# Patient Record
Sex: Male | Born: 1950
Health system: Southern US, Community
[De-identification: ages and names within clinical notes are randomized; demographics above are authoritative.]

## PROBLEM LIST (undated history)

## (undated) DIAGNOSIS — I10 Essential (primary) hypertension: Secondary | ICD-10-CM

## (undated) DIAGNOSIS — E039 Hypothyroidism, unspecified: Secondary | ICD-10-CM

## (undated) DIAGNOSIS — K219 Gastro-esophageal reflux disease without esophagitis: Secondary | ICD-10-CM

## (undated) DIAGNOSIS — G43109 Migraine with aura, not intractable, without status migrainosus: Secondary | ICD-10-CM

## (undated) DIAGNOSIS — Z87442 Personal history of urinary calculi: Secondary | ICD-10-CM

## (undated) DIAGNOSIS — I4891 Unspecified atrial fibrillation: Secondary | ICD-10-CM

## (undated) DIAGNOSIS — E7800 Pure hypercholesterolemia, unspecified: Secondary | ICD-10-CM

## (undated) DIAGNOSIS — N200 Calculus of kidney: Secondary | ICD-10-CM

## (undated) DIAGNOSIS — M1990 Unspecified osteoarthritis, unspecified site: Secondary | ICD-10-CM

## (undated) DIAGNOSIS — K3184 Gastroparesis: Secondary | ICD-10-CM

## (undated) DIAGNOSIS — J189 Pneumonia, unspecified organism: Secondary | ICD-10-CM

## (undated) DIAGNOSIS — F1020 Alcohol dependence, uncomplicated: Secondary | ICD-10-CM

## (undated) DIAGNOSIS — Z8489 Family history of other specified conditions: Secondary | ICD-10-CM

## (undated) DIAGNOSIS — C4491 Basal cell carcinoma of skin, unspecified: Secondary | ICD-10-CM

## (undated) DIAGNOSIS — IMO0002 Reserved for concepts with insufficient information to code with codable children: Secondary | ICD-10-CM

## (undated) DIAGNOSIS — F32A Depression, unspecified: Secondary | ICD-10-CM

## (undated) DIAGNOSIS — H353120 Nonexudative age-related macular degeneration, left eye, stage unspecified: Secondary | ICD-10-CM

## (undated) DIAGNOSIS — F329 Major depressive disorder, single episode, unspecified: Secondary | ICD-10-CM

## (undated) HISTORY — DX: Unspecified atrial fibrillation: I48.91

## (undated) HISTORY — PX: COLONOSCOPY: SHX174

## (undated) HISTORY — DX: Alcohol dependence, uncomplicated: F10.20

## (undated) HISTORY — DX: Personal history of urinary calculi: Z87.442

## (undated) HISTORY — DX: Gastroparesis: K31.84

## (undated) HISTORY — PX: CYSTOSCOPY W/ STONE MANIPULATION: SHX1427

## (undated) HISTORY — DX: Unspecified osteoarthritis, unspecified site: M19.90

## (undated) HISTORY — DX: Essential (primary) hypertension: I10

## (undated) HISTORY — PX: BACK SURGERY: SHX140

## (undated) HISTORY — PX: SQUAMOUS CELL CARCINOMA EXCISION: SHX2433

## (undated) HISTORY — DX: Pure hypercholesterolemia, unspecified: E78.00

## (undated) HISTORY — PX: BASAL CELL CARCINOMA EXCISION: SHX1214

## (undated) HISTORY — PX: MOHS SURGERY: SUR867

## (undated) HISTORY — DX: Gastro-esophageal reflux disease without esophagitis: K21.9

## (undated) HISTORY — PX: CATARACT EXTRACTION W/ INTRAOCULAR LENS IMPLANT: SHX1309

---

## 2001-12-09 ENCOUNTER — Emergency Department (HOSPITAL_COMMUNITY): Admission: EM | Admit: 2001-12-09 | Discharge: 2001-12-09 | Payer: Self-pay | Admitting: Emergency Medicine

## 2001-12-10 ENCOUNTER — Encounter: Payer: Self-pay | Admitting: Emergency Medicine

## 2001-12-10 ENCOUNTER — Encounter: Payer: Self-pay | Admitting: Cardiology

## 2001-12-10 ENCOUNTER — Inpatient Hospital Stay (HOSPITAL_COMMUNITY): Admission: EM | Admit: 2001-12-10 | Discharge: 2001-12-12 | Payer: Self-pay | Admitting: Emergency Medicine

## 2001-12-11 ENCOUNTER — Encounter: Payer: Self-pay | Admitting: Cardiology

## 2002-03-26 ENCOUNTER — Ambulatory Visit (HOSPITAL_COMMUNITY): Admission: RE | Admit: 2002-03-26 | Discharge: 2002-03-26 | Payer: Self-pay | Admitting: Cardiology

## 2002-03-26 ENCOUNTER — Encounter: Payer: Self-pay | Admitting: Cardiology

## 2002-03-29 HISTORY — PX: INGUINAL HERNIA REPAIR: SUR1180

## 2002-06-28 HISTORY — PX: CARDIOVERSION: SHX1299

## 2002-07-17 ENCOUNTER — Ambulatory Visit (HOSPITAL_COMMUNITY): Admission: RE | Admit: 2002-07-17 | Discharge: 2002-07-17 | Payer: Self-pay | Admitting: Cardiology

## 2003-02-27 HISTORY — PX: COLONOSCOPY W/ POLYPECTOMY: SHX1380

## 2003-03-25 ENCOUNTER — Ambulatory Visit (HOSPITAL_COMMUNITY): Admission: RE | Admit: 2003-03-25 | Discharge: 2003-03-25 | Payer: Self-pay | Admitting: Gastroenterology

## 2003-03-25 ENCOUNTER — Encounter (INDEPENDENT_AMBULATORY_CARE_PROVIDER_SITE_OTHER): Payer: Self-pay

## 2009-04-29 HISTORY — PX: ANTERIOR CERVICAL DECOMP/DISCECTOMY FUSION: SHX1161

## 2009-05-05 ENCOUNTER — Ambulatory Visit (HOSPITAL_COMMUNITY): Admission: RE | Admit: 2009-05-05 | Discharge: 2009-05-06 | Payer: Self-pay | Admitting: Neurosurgery

## 2010-02-09 ENCOUNTER — Ambulatory Visit: Payer: Self-pay | Admitting: Cardiology

## 2010-06-17 LAB — PROTIME-INR
INR: 1.07 (ref 0.00–1.49)
Prothrombin Time: 13.8 seconds (ref 11.6–15.2)

## 2010-06-17 LAB — CBC
MCHC: 34.7 g/dL (ref 30.0–36.0)
Platelets: 227 10*3/uL (ref 150–400)
RDW: 12.6 % (ref 11.5–15.5)

## 2010-06-17 LAB — URINALYSIS, ROUTINE W REFLEX MICROSCOPIC
Bilirubin Urine: NEGATIVE
Hgb urine dipstick: NEGATIVE
Protein, ur: NEGATIVE mg/dL
Urobilinogen, UA: 0.2 mg/dL (ref 0.0–1.0)

## 2010-06-17 LAB — BASIC METABOLIC PANEL
BUN: 10 mg/dL (ref 6–23)
CO2: 28 mEq/L (ref 19–32)
Calcium: 9.8 mg/dL (ref 8.4–10.5)
Chloride: 105 mEq/L (ref 96–112)
Creatinine, Ser: 0.95 mg/dL (ref 0.4–1.5)
GFR calc Af Amer: >60 mL/min (ref >60)
GFR calc non Af Amer: >60 mL/min (ref >60)
Glucose, Bld: 111 mg/dL — ABNORMAL HIGH (ref 70–99)
Potassium: 4.2 mEq/L (ref 3.5–5.1)
Sodium: 140 mEq/L (ref 135–145)

## 2010-08-05 ENCOUNTER — Other Ambulatory Visit: Payer: Self-pay | Admitting: Cardiology

## 2010-08-05 NOTE — Telephone Encounter (Signed)
Refill for Amlodipine done

## 2010-08-14 NOTE — Op Note (Signed)
Roy Long, Roy Long                          ACCOUNT NO.:  [id number]   MEDICAL RECORD NO.:  [id number]                   PATIENT TYPE:  AMB   LOCATION:  ENDO                                 FACILITY:  [facility]   PHYSICIAN:  John C. Madilyn Fireman, M.D.                 DATE OF BIRTH:  May 08, 1950   DATE OF PROCEDURE:  03/25/2003  DATE OF DISCHARGE:                                 OPERATIVE REPORT   PROCEDURE:  Colonoscopy with polypectomy.   INDICATION FOR PROCEDURE:  Colon cancer screening in a 60 year old patient  with no previous screening.   DESCRIPTION OF PROCEDURE:  The patient was placed in the left lateral  decubitus position and placed on the pulse monitor with continuous low-flow  oxygen delivered by nasal cannula.  He was sedated with 75 mcg IV fentanyl  and 7 mg IV Versed.  The Olympus video colonoscope was inserted into the  rectum and advanced to the cecum, confirmed by transillumination at  McBurney's point and visualization of the ileocecal valve and appendiceal  orifice.  The prep was good.  The cecum revealed a 5 mm polyp which was  fulgurated by hot biopsy.  Otherwise, the remainder of the cecum, ascending,  transverse, descending, and sigmoid colon appeared normal with no masses,  polyps, diverticula, or other mucosal abnormalities.  Near the rectosigmoid  junction at approximately 20 cm, there was a 1 cm sessile polyp removed by  snare.  The remainder of the rectum appeared normal.  The scope was then  withdrawn, and the patient returned to the recovery room in stable  condition.  He tolerated the procedure well, and there were no immediate  complications.   IMPRESSION:   IMPRESSION:  Rectosigmoid and cecal polyps.   PLAN:  Await histology to determine method and interval for future colon  screening.                                               John C. Madilyn Fireman, M.D.    JCH/MEDQ  D:  03/25/2003  T:  03/25/2003  Job:  [id number]   cc:   Cheri Rous  [address]  [place]  [place] [address]  Fax: [phone]

## 2010-08-14 NOTE — Discharge Summary (Signed)
Roy Long, Roy Long                          ACCOUNT NO.:  [id number]   MEDICAL RECORD NO.:  [id number]                   PATIENT TYPE:  INP   LOCATION:  4711                                 FACILITY:  [facility]   PHYSICIAN:  Peter M. Swaziland, M.D.               DATE OF BIRTH:  [birth year]   DATE OF ADMISSION:  12/10/2001  DATE OF DISCHARGE:  12/12/2001                                 DISCHARGE SUMMARY   HISTORY OF PRESENT ILLNESS:  The patient is a 60 year old male who presents  with recurrent atrial fibrillation. He had been having increasing symptoms  of tachy palpitations. He had initially been placed on Cardizem but had  breakthrough symptoms on this. On presentation, he was in atrial  fibrillation at a rate of 110. The patient does admit to heavy alcohol use,  drinking one bottle of wine per day. He has no other cardiac history. For  details of his past medical history, social history, family history, and  physical exam, please see admission history and physical.   LABORATORY DATA:  EKG showed atrial fibrillation with ventricular response  of 107, cannot rule out old anterior septal infarction.   Chest x-ray showed ill-defined right apical pleural density.   Coags were normal. White count was 5,100, hemoglobin 16.4, hematocrit 46.9,  platelets 201,000. Sodium 135, potassium 3.6, chloride 105, CO2 23, glucose  200, BUN 10, creatinine 0.9. All other chemistries were normal with  exception of a  total bilirubin of 1.4, magnesium 2.2. CK was 116 with  negative MB and troponin 0.02. TSH was 3.25.   HOSPITAL COURSE:  The patient was admitted to telemetry. He was continued on  p.o. Cardizem. We also started him on flecainide 100 mg twice a day. Repeat  potassium was 4.1. Repeat glucose was 90. We obtained repeat chest x-ray, PA  and lateral, with apical lordotic views. There was a persistent density in  the right apex. A CT of the chest was obtained, and this demonstrated a  pleural  parenchymal defect in the right apex consistent with old scarring.  This was also present to a lesser extent in the left apex. Followup plain  chest film was recommended in 3-4 months. The patient also had an  echocardiogram performed which was normal.   He tolerated Tambocor well. He did have one documented breakthrough with  atrial fibrillation while on Tambocor with a rate of 78 beats per minute  demonstrating good control. It was felt with further loading of the  medication that his arrhythmias would improve. We also discussed the impact  of alcohol on his arrhythmia and recommended that he abstain from alcohol.  The patient was discharged home in stable condition on 12/12/01.   DISCHARGE DIAGNOSES:  1. Paroxysmal atrial fibrillation.  2. Gastroesophageal reflux disease.  3. Genital herpes.  4. Alcohol abuse.   DISCHARGE MEDICATIONS:  1. Acyclovir 400 mg daily.  2.  Propecia 1 mg per day.  3. Lorazepam 1 mg q.6 h. p.r.n.  4. Aciphex 20 mg p.r.n.  5. Flecainide 100 mg twice a day.  6. Diltiazem SR 180 mg per day.  7. Lexapro 10 mg daily.  8. Coated aspirin 81 mg per day.   DISCHARGE INSTRUCTIONS:  The patient is told to abstain from alcohol. Will  follow up with Dr. Swaziland in two weeks.   DISCHARGE STATUS:  Stable.                                               Peter M. Swaziland, M.D.    PMJ/MEDQ  D:  12/12/2001  T:  12/13/2001  Job:  [id number]   cc:   Jim Desanctis, M.D.  [facility]

## 2010-08-14 NOTE — H&P (Signed)
NAME:  Roy Long, Roy Long NO.:  [id number]   MEDICAL RECORD NO.:  [id number]                   PATIENT TYPE:  OIB   LOCATION:                                       FACILITY:  [facility]   PHYSICIAN:  Peter M. Swaziland, M.D.               DATE OF BIRTH:  January 04, 1951   DATE OF ADMISSION:  07/17/2002  DATE OF DISCHARGE:                                HISTORY & PHYSICAL   HISTORY OF PRESENT ILLNESS:  The patient is a 60 year old white male who  presents with recurrent atrial fibrillation and flutter. He was initially  admitted to the hospital in 11/2001 with findings of atrial fibrillation. He  has a history of alcohol abuse. The patient was initially seen in the  emergency room at that time and converted on Cardizem. However, his symptoms  recurred and he presented with persistent atrial fibrillation. He was  started on flecainide at that time and did convert to normal sinus rhythm.  He continued to do well without any recurrences and stopped drinking, so  flecainide in January. However, he went back to drinking alcohol recently  and returned with findings of atrial flutter with rapid ventricular  response. He was started back on his flecainide as well as Lanoxin with  better rate control. He was also anticoagulated, but has had persistence of  his atrial fibrillation/flutter and is now admitted for elective  cardioversion.   PAST MEDICAL HISTORY:  Significant for GERD, genital herpes, seasonal  allergies, and a remote history of kidney stone.   ALLERGIES:  No known drug allergies.   CURRENT MEDICATIONS:  1. Coumadin 5 mg four days a week, 2.5 mg three days a week.  2. Acyclovir 400 mg per day.  3. Propecia 1 mg per day.  4. Flecainide 150 mg b.i.d.  5. Diltiazem SR 240 mg per day.  6. Lexapro 10 mg per day.  7. Lanoxin 0.25 mg daily.   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  Has no children. He has a history of alcohol  abuse. He reports that he has  not had anything to drink in the last two weeks. Denies tobacco use.   FAMILY HISTORY:  Noncontributory.   REVIEW OF SYMPTOMS:  Otherwise unremarkable.   PHYSICAL EXAMINATION:  GENERAL:  The patient is a thin white male in no  distress.  VITAL SIGNS:  Weight 118, blood pressure 112/80, pulse 100 and irregular.  Respirations are normal.  HEENT:  Pupils equal, round, and reactive. Conjunctivae clear. Oropharynx is  clear.  NECK:  Without JVD, adenopathy, thyromegaly, or bruits.  LUNGS:  Clear.  CARDIAC:  Irregular rate and rhythm without gallops, murmurs, or clicks.  ABDOMEN:  Soft, nontender, without hepatosplenomegaly, masses, or bruits.  EXTREMITIES:  Without edema. Pulses are 2+ and symmetric.  NEUROLOGIC:  Intact.   LABORATORY DATA:  Current ProTime  is 15.9 with an INR of 1.94. ECG shows  atrial flutter with a rapid ventricular response. On the patient's original  admission in September he had thyroid functions, which were normal and an  echocardiogram, which was also normal.   IMPRESSION:  1. Recurrent atrial fibrillation/flutter.  2. Alcohol abuse.  3. Gastroesophageal reflux disease.   PLAN:  The patient will be admitted for elective cardioversion.                                               Peter M. Swaziland, M.D.    PMJ/MEDQ  D:  07/10/2002  T:  07/10/2002  Job:  [id number]   cc:   Cheri Rous  [address]  [place]  [place] [address]  Fax: [phone]

## 2010-08-14 NOTE — Op Note (Signed)
   [name], Roy Long                          ACCOUNT NO.:  [id number]   MEDICAL RECORD NO.:  [id number]                   PATIENT TYPE:  OIB   LOCATION:  2866                                 FACILITY:  [facility]   PHYSICIAN:  Peter M. Swaziland, M.D.               DATE OF BIRTH:  01-27-1951   DATE OF PROCEDURE:  07/17/2002  DATE OF DISCHARGE:                                 OPERATIVE REPORT   PROCEDURE PERFORMED:  Electrocardioversion.   INDICATIONS FOR PROCEDURE:  The patient is a 60 year old male with recurrent  atrial flutter despite antiarrhythmic drug therapy.   DESCRIPTION OF PROCEDURE:  The patient was anesthetized with 200 mg of IV  Pentothal.  He was given a single biphasic DC shock at 100 joules with  prompt return to normal sinus rhythm.  There were no complications.   IMPRESSION:  Successful DC cardioversion.                                               Peter M. Swaziland, M.D.    PMJ/MEDQ  D:  07/17/2002  T:  07/17/2002  Job:  [id number]   cc:   Cheri Rous  [address]  [place]  [place] [address]  Fax: [phone]

## 2010-08-14 NOTE — H&P (Signed)
Roy Long, Roy Long                          ACCOUNT NO.:  [id number]   MEDICAL RECORD NO.:  [id number]                   PATIENT TYPE:  INP   LOCATION:  4711                                 FACILITY:  [facility]   PHYSICIAN:  Peter M. Swaziland, M.D.               DATE OF BIRTH:  16-Dec-1950   DATE OF ADMISSION:  12/10/2001  DATE OF DISCHARGE:                                HISTORY & PHYSICAL   HISTORY OF PRESENT ILLNESS:  Roy Long is a 60 year old white male who  presents with recurrent tachy palpitations. The patient  reports he has been  having increasing palpitations since July. He notes that his heart beats  fast and irregular. He saw a P.A. at [facility] last week  and Holter monitor was placed on Thursday. He presented to the emergency  room  on Friday evening and was noted to be in atrial fibrillation. He  converted spontaneously and was discharged on p.o. Cardizem. Today, his  symptoms recurred and did not resolve. He had some mild left arm numbness.  He denied any chest pain, shortness of breath or syncope. He presented to  the emergency room  and was found to be back in atrial fibrillation with a  rate of 110. The patient states his symptom frequency and severity have  increased over the past two weeks. The patient does admit to drinking  heavily for the past two years. He is currently drinking one bottle of wine  per day.   PAST MEDICAL HISTORY:  1. Gastroesophageal reflux disease.  2. Genital herpes.  3. Seasonal allergies.  4. History of kidney stone 20 years ago.   ALLERGIES:  He has no known allergies.   MEDICATIONS:  1. Acyclovir 400 mg per day.  2. Aciphex p.r.n.  3. Propecia daily.  4. Zyrtec p.r.n.  5. Lorazepam p.r.n.   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 He drinks one bottle of wine per day. He has not smoked in  18 years.   FAMILY HISTORY:  Mother has history of congestive  heart failure. Father  died, question as to whether he had myocardial infarction. One brother has  had two previous strokes.   REVIEW OF SYSTEMS:  No change in weight or appetite. No bladder or bladder  complaints. All other review of systems were negative.   PHYSICAL EXAMINATION:  GENERAL:  The patient is a thin white male in no  apparent distress.   VITAL SIGNS:  Blood pressure is 144/85, pulse 107 and irregular.  Respirations 20. Afebrile.   HEENT:  Unremarkable. He has no JVD, thyromegaly or bruits.   LUNGS:  Clear.   CARDIAC:  Reveals irregular rate and rhythm without murmurs, rubs, gallops  or clicks.   ABDOMEN:  Soft and nontender. There are no masses or hepatosplenomegaly.  Femoral and pedal pulses  are 2+ and symmetric.   NEUROLOGIC:  Intact.   LABORATORY DATA:  Chest x-ray portable study demonstrates question of right  apical density. Hyperinflation consistent with COPD. ECG shows atrial  fibrillation, rapid ventricular response. Cannot rule out old septal  infarction. Sodium 135, potassium 3.6, chloride 105, CO2 23, BUN 10,  creatinine 0.9, glucose 200, total bilirubin 1.4, white count 5100,  hemoglobin 16.4, hematocrit 46.9, platelets 201,000. Coags are normal. CK is  116 with negative MB. Troponin 0.02.   IMPRESSION:  1. Recurrent atrial fibrillation, rapid ventricular response possibly     related to alcohol abuse.  2. Alcohol abuse.  3. Gastroesophageal reflux disease.  4. Apical density noted on chest x-ray.   PLAN:  The patient will be admitted to telemetry. Continue on aspirin,  Cardizem. Will add flecainide 100 mg b.i.d. We will obtain magnesium level,  TSH and echocardiogram. Repeat PA and lateral chest x-ray with apical  lordotic views. Will treat with multivitamins and thiamine and add lorazepam  for alcohol withdrawal.                                               Peter M. Swaziland, M.D.    PMJ/MEDQ  D:  12/10/2001  T:  12/11/2001  Job:   [id number]   cc:   Cheri Rous

## 2010-09-10 ENCOUNTER — Encounter: Payer: Self-pay | Admitting: Cardiology

## 2010-09-16 ENCOUNTER — Ambulatory Visit (INDEPENDENT_AMBULATORY_CARE_PROVIDER_SITE_OTHER): Payer: BC Managed Care – PPO | Admitting: Cardiology

## 2010-09-16 ENCOUNTER — Encounter: Payer: Self-pay | Admitting: Cardiology

## 2010-09-16 DIAGNOSIS — I10 Essential (primary) hypertension: Secondary | ICD-10-CM

## 2010-09-16 DIAGNOSIS — F1020 Alcohol dependence, uncomplicated: Secondary | ICD-10-CM

## 2010-09-16 DIAGNOSIS — I4891 Unspecified atrial fibrillation: Secondary | ICD-10-CM

## 2010-09-16 NOTE — Progress Notes (Signed)
   Roy Long Date of Birth: 03-30-1950   History of Present Illness: Roy Long is seen today for followup. He states he's been doing very well. He has had no significant atrial fibrillation since his visit here in November. He states he is not drinking as much alcohol. His stress has been under control.  Current Outpatient Prescriptions on File Prior to Visit  Medication Sig Dispense Refill  . acyclovir (ZOVIRAX) 400 MG tablet Take 400 mg by mouth daily.        Marland Kitchen amLODipine (NORVASC) 5 MG tablet TAKE 1 TABLET BY MOUTH EVERY DAY  30 tablet  5  . aspirin 81 MG tablet Take 81 mg by mouth daily.        . citalopram (CELEXA) 20 MG tablet Take 20 mg by mouth daily.        Marland Kitchen diltiazem (CARDIZEM CD) 180 MG 24 hr capsule Take 180 mg by mouth daily.        . finasteride (PROSCAR) 5 MG tablet Take 5 mg by mouth daily.        . flecainide (TAMBOCOR) 100 MG tablet Take 100 mg by mouth 2 (two) times daily.        . fluticasone (FLOVENT HFA) 110 MCG/ACT inhaler Inhale 2 puffs into the lungs 2 (two) times daily.        . Glucosamine-Chondroitin (COSAMIN DS PO) Take by mouth daily. 2 DAILY       . mometasone (NASONEX) 50 MCG/ACT nasal spray Place 2 sprays into the nose as needed.        . Multiple Vitamin (MULTIVITAMIN) tablet Take 1 tablet by mouth daily.        Marland Kitchen omeprazole (PRILOSEC) 40 MG capsule Take 40 mg by mouth daily.        . montelukast (SINGULAIR) 10 MG tablet Take 10 mg by mouth at bedtime.          No Known Allergies  Past Medical History  Diagnosis Date  . Atrial fibrillation   . Hypertension   . Alcohol dependence   . GERD (gastroesophageal reflux disease)   . History of kidney stones     Past Surgical History  Procedure Date  . Cervical fusion     History  Smoking status  . Former Smoker  . Quit date: 03/30/1983  Smokeless tobacco  . Not on file    History  Alcohol Use No    Family History  Problem Relation Age of Onset  . Heart failure Mother   . Heart attack  Father   . Stroke Brother     X2    Review of Systems:   All other systems were reviewed and are negative.  Physical Exam: BP 126/66  Pulse 72  Wt 128 lb 12.8 oz (58.423 kg) He is a thin white male in no acute distress. HEENT exam is unremarkable. He has no JVD or bruits. Lungs are clear. Cardiac exam reveals a regular rate and rhythm without gallop or murmur. Abdomen is soft and nontender. He has no edema. LABORATORY DATA:   Assessment / Plan:

## 2010-09-16 NOTE — Patient Instructions (Signed)
Continue current medications.    We will see you back in 6 months!

## 2010-09-16 NOTE — Assessment & Plan Note (Signed)
I have encouraged abstinence from alcohol.

## 2010-09-16 NOTE — Assessment & Plan Note (Signed)
Well controlled on flecainide. We will continue his current therapy and follow up again in 6 months with an ECG.

## 2010-09-16 NOTE — Assessment & Plan Note (Signed)
Blood pressure is well controlled on current medications. 

## 2010-10-28 ENCOUNTER — Other Ambulatory Visit: Payer: Self-pay | Admitting: Cardiology

## 2010-10-28 NOTE — Telephone Encounter (Signed)
escribe medication per fax request  

## 2011-01-27 ENCOUNTER — Other Ambulatory Visit: Payer: Self-pay | Admitting: Cardiology

## 2011-02-26 ENCOUNTER — Other Ambulatory Visit: Payer: Self-pay | Admitting: Dermatology

## 2011-03-18 ENCOUNTER — Ambulatory Visit (INDEPENDENT_AMBULATORY_CARE_PROVIDER_SITE_OTHER): Payer: BC Managed Care – PPO | Admitting: Cardiology

## 2011-03-18 ENCOUNTER — Encounter: Payer: Self-pay | Admitting: Cardiology

## 2011-03-18 VITALS — BP 142/70 | HR 59 | Ht 68.0 in | Wt 130.0 lb

## 2011-03-18 DIAGNOSIS — I10 Essential (primary) hypertension: Secondary | ICD-10-CM

## 2011-03-18 DIAGNOSIS — I4891 Unspecified atrial fibrillation: Secondary | ICD-10-CM

## 2011-03-18 MED ORDER — FLECAINIDE ACETATE 100 MG PO TABS: 100.0000 mg | ORAL_TABLET | Freq: Two times a day (BID) | ORAL | Status: DC

## 2011-03-18 MED ORDER — DILTIAZEM HCL ER COATED BEADS 180 MG PO CP24: 180.0000 mg | ORAL_CAPSULE | Freq: Every day | ORAL | Status: DC

## 2011-03-18 MED ORDER — AMLODIPINE BESYLATE 5 MG PO TABS: 5.0000 mg | ORAL_TABLET | Freq: Every day | ORAL | Status: DC

## 2011-03-18 NOTE — Patient Instructions (Addendum)
Continue your current medications.  I will see you again in 1 year.  Will refer to [facility] to get established- possibly Dr. Sanda Linger

## 2011-03-19 NOTE — Assessment & Plan Note (Signed)
Blood pressure is well controlled on current medications. 

## 2011-03-19 NOTE — Assessment & Plan Note (Signed)
This is very well controlled on flecainide. We will continue with his current medical therapy and followup again in 6 months.

## 2011-03-19 NOTE — Progress Notes (Signed)
   Roy Long Date of Birth: 1950-04-30   History of Present Illness: Roy Long is seen today for followup. He states he's been doing very well. He denies any symptoms of atrial fibrillation over the past 6 months. He denies any chest pain or shortness of breath.  Current Outpatient Prescriptions on File Prior to Visit  Medication Sig Dispense Refill  . acyclovir (ZOVIRAX) 400 MG tablet Take 400 mg by mouth daily.        Marland Kitchen amLODipine (NORVASC) 5 MG tablet Take 1 tablet (5 mg total) by mouth daily.  90 tablet  3  . aspirin 81 MG tablet Take 81 mg by mouth daily.        . citalopram (CELEXA) 20 MG tablet Take 20 mg by mouth daily.        Marland Kitchen diltiazem (CARDIZEM CD) 180 MG 24 hr capsule Take 1 capsule (180 mg total) by mouth daily.  90 capsule  3  . finasteride (PROSCAR) 5 MG tablet Take 5 mg by mouth daily.        . flecainide (TAMBOCOR) 100 MG tablet Take 1 tablet (100 mg total) by mouth 2 (two) times daily.  180 tablet  3  . fluticasone (FLOVENT HFA) 110 MCG/ACT inhaler Inhale 2 puffs into the lungs 2 (two) times daily.        . Glucosamine-Chondroitin (COSAMIN DS PO) Take by mouth daily. 2 DAILY       . mometasone (NASONEX) 50 MCG/ACT nasal spray Place 2 sprays into the nose as needed.        . Multiple Vitamin (MULTIVITAMIN) tablet Take 1 tablet by mouth daily.        Marland Kitchen omeprazole (PRILOSEC) 40 MG capsule Take 20 mg by mouth daily.         No Known Allergies  Past Medical History  Diagnosis Date  . Atrial fibrillation   . Hypertension   . Alcohol dependence   . GERD (gastroesophageal reflux disease)   . History of kidney stones   . Cancer of skin, squamous cell     hand, chest    Past Surgical History  Procedure Date  . Cervical fusion     History  Smoking status  . Former Smoker  . Quit date: 03/30/1983  Smokeless tobacco  . Not on file    History  Alcohol Use No    Family History  Problem Relation Age of Onset  . Heart failure Mother   . Heart attack Father     . Stroke Brother     X2    Review of Systems:  As noted in history of present illness. All other systems were reviewed and are negative.  Physical Exam: BP 142/70  Pulse 59  Ht 5\' 8"  (1.727 m)  Wt 130 lb (58.968 kg)  BMI 19.77 kg/m2 He is a thin white male in no acute distress. HEENT exam is unremarkable. He has no JVD or bruits. Lungs are clear. Cardiac exam reveals a regular rate and rhythm without gallop or murmur. Abdomen is soft and nontender. He has no edema. LABORATORY DATA: ECG today demonstrates sinus bradycardia. Cannot rule out septal infarct old.  Assessment / Plan:

## 2011-04-15 ENCOUNTER — Encounter: Payer: Self-pay | Admitting: Internal Medicine

## 2011-04-15 ENCOUNTER — Ambulatory Visit (INDEPENDENT_AMBULATORY_CARE_PROVIDER_SITE_OTHER): Payer: BC Managed Care – PPO | Admitting: Internal Medicine

## 2011-04-15 ENCOUNTER — Other Ambulatory Visit (INDEPENDENT_AMBULATORY_CARE_PROVIDER_SITE_OTHER): Payer: BC Managed Care – PPO

## 2011-04-15 VITALS — BP 138/72 | HR 72 | Temp 98.7°F | Resp 16 | Wt 133.0 lb

## 2011-04-15 DIAGNOSIS — R7309 Other abnormal glucose: Secondary | ICD-10-CM

## 2011-04-15 DIAGNOSIS — I10 Essential (primary) hypertension: Secondary | ICD-10-CM

## 2011-04-15 DIAGNOSIS — E039 Hypothyroidism, unspecified: Secondary | ICD-10-CM

## 2011-04-15 DIAGNOSIS — F1020 Alcohol dependence, uncomplicated: Secondary | ICD-10-CM

## 2011-04-15 DIAGNOSIS — R739 Hyperglycemia, unspecified: Secondary | ICD-10-CM

## 2011-04-15 DIAGNOSIS — Z0000 Encounter for general adult medical examination without abnormal findings: Secondary | ICD-10-CM | POA: Insufficient documentation

## 2011-04-15 DIAGNOSIS — R160 Hepatomegaly, not elsewhere classified: Secondary | ICD-10-CM

## 2011-04-15 LAB — COMPREHENSIVE METABOLIC PANEL
Albumin: 4.3 g/dL (ref 3.5–5.2)
Alkaline Phosphatase: 64 U/L (ref 39–117)
BUN: 16 mg/dL (ref 6–23)
GFR: 85.19 mL/min (ref >60.00)
Glucose, Bld: 105 mg/dL — ABNORMAL HIGH (ref 70–99)
Total Bilirubin: 0.6 mg/dL (ref 0.3–1.2)

## 2011-04-15 LAB — TSH: TSH: 2.92 u[IU]/mL (ref 0.35–5.50)

## 2011-04-15 LAB — HEMOGLOBIN A1C: Hgb A1c MFr Bld: 5.1 % (ref 4.6–6.5)

## 2011-04-15 NOTE — Assessment & Plan Note (Signed)
I am concerned that he has alcoholic liver disease +/- cirrhosis so I have ordered LFT's on him and will check a PT/INR as well

## 2011-04-15 NOTE — Progress Notes (Signed)
Subjective:    Patient ID: Roy Long, male    DOB: 1951-01-20, 61 y.o.   MRN: [id number]  HPI New to me for a physical. He tells me that he saw his PCP in [place] about one month ago and had labs done but he was not happy and tells me that the exam was cursory and incomplete. He has not been told about the results of the labs and he dose not have a copy of the labs with him today. He admits to heavy/daily alcohol intake for many many years and has had Afib as a consequence. He feels well today.   Review of Systems  Constitutional: Negative for fever, chills, diaphoresis, activity change, appetite change, fatigue and unexpected weight change.  HENT: Negative.   Eyes: Negative.   Respiratory: Negative for cough, chest tightness, shortness of breath, wheezing and stridor.   Cardiovascular: Negative for chest pain, palpitations and leg swelling.  Gastrointestinal: Negative for nausea, vomiting, abdominal pain, diarrhea, blood in stool and anal bleeding.  Genitourinary: Negative.   Musculoskeletal: Negative for myalgias, back pain, joint swelling, arthralgias and gait problem.  Skin: Negative.   Neurological: Negative.   Hematological: Negative for adenopathy. Does not bruise/bleed easily.  Psychiatric/Behavioral: Negative.        Objective:   Physical Exam  Vitals reviewed. Constitutional: He is oriented to person, place, and time. He appears well-developed and well-nourished. No distress.  HENT:  Head: Normocephalic and atraumatic.  Mouth/Throat: Oropharynx is clear and moist. No oropharyngeal exudate.  Eyes: Conjunctivae are normal. Right eye exhibits no discharge. Left eye exhibits no discharge. No scleral icterus.  Neck: Normal range of motion. Neck supple. No JVD present. No tracheal deviation present. No thyromegaly present.  Cardiovascular: Normal rate, regular rhythm, normal heart sounds and intact distal pulses.  Exam reveals no gallop and no friction rub.   No murmur  heard. Pulmonary/Chest: Effort normal and breath sounds normal. No stridor. No respiratory distress. He has no wheezes. He has no rales. He exhibits no tenderness.  Abdominal: Soft. Normal appearance and bowel sounds are normal. He exhibits no distension and no mass. There is hepatosplenomegaly and hepatomegaly. There is no splenomegaly. There is no tenderness. There is no rebound, no guarding and no CVA tenderness. No hernia. Hernia confirmed negative in the ventral area, confirmed negative in the right inguinal area and confirmed negative in the left inguinal area.  Genitourinary: Rectum normal, testes normal and penis normal. Rectal exam shows no external hemorrhoid, no internal hemorrhoid, no fissure, no mass, no tenderness and anal tone normal. Guaiac negative stool. Prostate is enlarged (left gland is very slightly larger than the right gland). Prostate is not tender. Right testis shows no mass, no swelling and no tenderness. Right testis is descended. Left testis shows no mass, no swelling and no tenderness. Left testis is descended. Circumcised. No penile tenderness. No discharge found.  Musculoskeletal: Normal range of motion. He exhibits no edema and no tenderness.  Lymphadenopathy:    He has no cervical adenopathy.       Right: No inguinal adenopathy present.       Left: No inguinal adenopathy present.  Neurological: He is oriented to person, place, and time.  Skin: Skin is warm, dry and intact. No abrasion, no bruising, no burn, no ecchymosis, no laceration, no lesion and no rash noted. He is not diaphoretic. No cyanosis or erythema. No pallor. Nails show no clubbing.          He has scattered  spider angiomatas over his chest and abd  Psychiatric: He has a normal mood and affect. His behavior is normal. Judgment and thought content normal.      Lab Results  Component Value Date   WBC 5.7 04/30/2009   HGB 15.2 04/30/2009   HCT 43.8 04/30/2009   PLT 227 04/30/2009   GLUCOSE 111* 04/30/2009     NA 140 04/30/2009   K 4.2 04/30/2009   CL 105 04/30/2009   CREATININE 0.95 04/30/2009   BUN 10 04/30/2009   CO2 28 04/30/2009   INR 1.07 04/30/2009      Assessment & Plan:

## 2011-04-15 NOTE — Assessment & Plan Note (Signed)
Check his TSH today 

## 2011-04-15 NOTE — Patient Instructions (Signed)

## 2011-04-15 NOTE — Assessment & Plan Note (Signed)
He agrees to pursue treatment options for his alcohol dependence, he will continue to try to taper and quit and will attend AA meetings to gather more info about the disease and treatment options

## 2011-04-15 NOTE — Assessment & Plan Note (Signed)
I have requested the records from Dr. Egbert Garibaldi to review

## 2011-04-15 NOTE — Assessment & Plan Note (Signed)
His BP is well controlled today, I will check his lytes and renal function

## 2011-04-15 NOTE — Assessment & Plan Note (Signed)
I will check an a1c on him today

## 2011-04-23 ENCOUNTER — Other Ambulatory Visit: Payer: Self-pay | Admitting: Internal Medicine

## 2011-04-23 DIAGNOSIS — N400 Enlarged prostate without lower urinary tract symptoms: Secondary | ICD-10-CM | POA: Insufficient documentation

## 2011-04-27 ENCOUNTER — Encounter: Payer: Self-pay | Admitting: Internal Medicine

## 2011-04-27 ENCOUNTER — Other Ambulatory Visit (INDEPENDENT_AMBULATORY_CARE_PROVIDER_SITE_OTHER): Payer: BC Managed Care – PPO

## 2011-04-27 DIAGNOSIS — N400 Enlarged prostate without lower urinary tract symptoms: Secondary | ICD-10-CM

## 2011-04-27 LAB — PSA: PSA: 0.17 ng/mL (ref 0.10–4.00)

## 2011-05-04 ENCOUNTER — Ambulatory Visit (INDEPENDENT_AMBULATORY_CARE_PROVIDER_SITE_OTHER): Payer: BC Managed Care – PPO

## 2011-05-04 DIAGNOSIS — Z23 Encounter for immunization: Secondary | ICD-10-CM

## 2011-08-06 ENCOUNTER — Ambulatory Visit: Payer: BC Managed Care – PPO | Admitting: Internal Medicine

## 2011-08-20 ENCOUNTER — Encounter: Payer: Self-pay | Admitting: Internal Medicine

## 2011-08-20 ENCOUNTER — Ambulatory Visit (INDEPENDENT_AMBULATORY_CARE_PROVIDER_SITE_OTHER): Payer: BC Managed Care – PPO | Admitting: Internal Medicine

## 2011-08-20 ENCOUNTER — Other Ambulatory Visit (INDEPENDENT_AMBULATORY_CARE_PROVIDER_SITE_OTHER): Payer: BC Managed Care – PPO

## 2011-08-20 VITALS — BP 136/72 | HR 60 | Temp 97.8°F | Resp 16 | Wt 129.5 lb

## 2011-08-20 DIAGNOSIS — F1020 Alcohol dependence, uncomplicated: Secondary | ICD-10-CM

## 2011-08-20 DIAGNOSIS — R7309 Other abnormal glucose: Secondary | ICD-10-CM

## 2011-08-20 DIAGNOSIS — I10 Essential (primary) hypertension: Secondary | ICD-10-CM

## 2011-08-20 DIAGNOSIS — R739 Hyperglycemia, unspecified: Secondary | ICD-10-CM

## 2011-08-20 DIAGNOSIS — E039 Hypothyroidism, unspecified: Secondary | ICD-10-CM

## 2011-08-20 DIAGNOSIS — I4891 Unspecified atrial fibrillation: Secondary | ICD-10-CM

## 2011-08-20 LAB — BASIC METABOLIC PANEL
BUN: 12 mg/dL (ref 6–23)
CO2: 27 mEq/L (ref 19–32)
Calcium: 9 mg/dL (ref 8.4–10.5)
Creatinine, Ser: 1 mg/dL (ref 0.4–1.5)

## 2011-08-20 LAB — LIPID PANEL
Total CHOL/HDL Ratio: 4
Triglycerides: 98 mg/dL (ref 0.0–149.0)

## 2011-08-20 NOTE — Assessment & Plan Note (Signed)
Will stop amlodipine 

## 2011-08-20 NOTE — Progress Notes (Signed)
  Subjective:    Patient ID: Roy Long, male    DOB: 24-Feb-1951, 61 y.o.   MRN: [id number]  Hypertension This is a chronic problem. The current episode started more than 1 year ago. The problem has been gradually improving since onset. The problem is controlled. Pertinent negatives include no anxiety, blurred vision, chest pain, headaches, malaise/fatigue, neck pain, orthopnea, palpitations, peripheral edema, PND, shortness of breath or sweats. Past treatments include calcium channel blockers. The current treatment provides moderate improvement. Compliance problems include medication side effects (constipation).       Review of Systems  Constitutional: Negative.  Negative for malaise/fatigue.  HENT: Negative.  Negative for neck pain.   Eyes: Negative.  Negative for blurred vision.  Respiratory: Negative for cough, chest tightness, shortness of breath, wheezing and stridor.   Cardiovascular: Negative for chest pain, palpitations, orthopnea, leg swelling and PND.  Gastrointestinal: Positive for constipation. Negative for nausea, vomiting, abdominal pain, diarrhea, blood in stool, abdominal distention, anal bleeding and rectal pain.  Genitourinary: Negative.   Neurological: Negative for dizziness, tremors, seizures, syncope, facial asymmetry, speech difficulty, weakness, light-headedness, numbness and headaches.  Hematological: Negative for adenopathy. Does not bruise/bleed easily.  Psychiatric/Behavioral: Negative.        Objective:   Physical Exam  Vitals reviewed. Constitutional: He is oriented to person, place, and time. He appears well-developed and well-nourished. No distress.  HENT:  Head: Normocephalic and atraumatic.  Mouth/Throat: Oropharynx is clear and moist. No oropharyngeal exudate.  Eyes: Conjunctivae are normal. Right eye exhibits no discharge. Left eye exhibits no discharge. No scleral icterus.  Neck: Normal range of motion. Neck supple. No JVD present. No tracheal  deviation present. No thyromegaly present.  Cardiovascular: Normal rate, regular rhythm, normal heart sounds and intact distal pulses.  Exam reveals no gallop and no friction rub.   No murmur heard. Pulmonary/Chest: Effort normal and breath sounds normal. No stridor. No respiratory distress. He has no wheezes. He has no rales. He exhibits no tenderness.  Abdominal: Soft. Bowel sounds are normal. He exhibits no distension and no mass. There is no tenderness. There is no rebound and no guarding.  Musculoskeletal: Normal range of motion. He exhibits no edema and no tenderness.  Lymphadenopathy:    He has no cervical adenopathy.  Neurological: He is oriented to person, place, and time.  Skin: Skin is warm and dry. No rash noted. He is not diaphoretic. No erythema. No pallor.  Psychiatric: He has a normal mood and affect. His behavior is normal. Judgment and thought content normal.     Lab Results  Component Value Date   WBC 5.7 04/30/2009   HGB 15.2 04/30/2009   HCT 43.8 04/30/2009   PLT 227 04/30/2009   GLUCOSE 105* 04/15/2011   ALT 16 04/15/2011   AST 25 04/15/2011   NA 139 04/15/2011   K 3.9 04/15/2011   CL 103 04/15/2011   CREATININE 1.0 04/15/2011   BUN 16 04/15/2011   CO2 26 04/15/2011   TSH 2.92 04/15/2011   PSA 0.17 04/27/2011   INR 1.0 04/15/2011   HGBA1C 5.1 04/15/2011       Assessment & Plan:

## 2011-08-20 NOTE — Assessment & Plan Note (Signed)
I will check his TSH today 

## 2011-08-20 NOTE — Assessment & Plan Note (Signed)
I will check his BMP today

## 2011-08-20 NOTE — Patient Instructions (Signed)
Hypothyroidism The thyroid is a large gland located in the lower front of your neck. The thyroid gland helps control metabolism. Metabolism is how your body handles food. It controls metabolism with the hormone thyroxine. When this gland is underactive (hypothyroid), it produces too little hormone.  CAUSES These include:   Absence or destruction of thyroid tissue.   Goiter due to iodine deficiency.   Goiter due to medications.   Congenital defects (since birth).   Problems with the pituitary. This causes a lack of TSH (thyroid stimulating hormone). This hormone tells the thyroid to turn out more hormone.  SYMPTOMS  Lethargy (feeling as though you have no energy)   Cold intolerance   Weight gain (in spite of normal food intake)   Dry skin   Coarse hair   Menstrual irregularity (if severe, may lead to infertility)   Slowing of thought processes  Cardiac problems are also caused by insufficient amounts of thyroid hormone. Hypothyroidism in the newborn is cretinism, and is an extreme form. It is important that this form be treated adequately and immediately or it will lead rapidly to retarded physical and mental development. DIAGNOSIS  To prove hypothyroidism, your caregiver may do blood tests and ultrasound tests. Sometimes the signs are hidden. It may be necessary for your caregiver to watch this illness with blood tests either before or after diagnosis and treatment. TREATMENT  Low levels of thyroid hormone are increased by using synthetic thyroid hormone. This is a safe, effective treatment. It usually takes about four weeks to gain the full effects of the medication. After you have the full effect of the medication, it will generally take another four weeks for problems to leave. Your caregiver may start you on low doses. If you have had heart problems the dose may be gradually increased. It is generally not an emergency to get rapidly to normal. HOME CARE INSTRUCTIONS   Take  your medications as your caregiver suggests. Let your caregiver know of any medications you are taking or start taking. Your caregiver will help you with dosage schedules.   As your condition improves, your dosage needs may increase. It will be necessary to have continuing blood tests as suggested by your caregiver.   Report all suspected medication side effects to your caregiver.  SEEK MEDICAL CARE IF: Seek medical care if you develop:  Sweating.   Tremulousness (tremors).   Anxiety.   Rapid weight loss.   Heat intolerance.   Emotional swings.   Diarrhea.   Weakness.  SEEK IMMEDIATE MEDICAL CARE IF:  You develop chest pain, an irregular heart beat (palpitations), or a rapid heart beat. MAKE SURE YOU:   Understand these instructions.   Will watch your condition.   Will get help right away if you are not doing well or get worse.  Document Released: 03/15/2005 Document Revised: 03/04/2011 Document Reviewed: 11/03/2007 [employer] Patient Information 2012 [employer].Hypertension As your heart beats, it forces blood through your arteries. This force is your blood pressure. If the pressure is too high, it is called hypertension (HTN) or high blood pressure. HTN is dangerous because you may have it and not know it. High blood pressure may mean that your heart has to work harder to pump blood. Your arteries may be narrow or stiff. The extra work puts you at risk for heart disease, stroke, and other problems.  Blood pressure consists of two numbers, a higher number over a lower, 110/72, for example. It is stated as "110 over 72." The   ideal is below 120 for the top number (systolic) and under 80 for the bottom (diastolic). Write down your blood pressure today. You should pay close attention to your blood pressure if you have certain conditions such as:  Heart failure.   Prior heart attack.   Diabetes   Chronic kidney disease.   Prior stroke.   Multiple risk factors for heart  disease.  To see if you have HTN, your blood pressure should be measured while you are seated with your arm held at the level of the heart. It should be measured at least twice. A one-time elevated blood pressure reading (especially in the Emergency Department) does not mean that you need treatment. There may be conditions in which the blood pressure is different between your right and left arms. It is important to see your caregiver soon for a recheck. Most people have essential hypertension which means that there is not a specific cause. This type of high blood pressure may be lowered by changing lifestyle factors such as:  Stress.   Smoking.   Lack of exercise.   Excessive weight.   Drug/tobacco/alcohol use.   Eating less salt.  Most people do not have symptoms from high blood pressure until it has caused damage to the body. Effective treatment can often prevent, delay or reduce that damage. TREATMENT  When a cause has been identified, treatment for high blood pressure is directed at the cause. There are a large number of medications to treat HTN. These fall into several categories, and your caregiver will help you select the medicines that are best for you. Medications may have side effects. You should review side effects with your caregiver. If your blood pressure stays high after you have made lifestyle changes or started on medicines,   Your medication(s) may need to be changed.   Other problems may need to be addressed.   Be certain you understand your prescriptions, and know how and when to take your medicine.   Be sure to follow up with your caregiver within the time frame advised (usually within two weeks) to have your blood pressure rechecked and to review your medications.   If you are taking more than one medicine to lower your blood pressure, make sure you know how and at what times they should be taken. Taking two medicines at the same time can result in blood pressure  that is too low.  SEEK IMMEDIATE MEDICAL CARE IF:  You develop a severe headache, blurred or changing vision, or confusion.   You have unusual weakness or numbness, or a faint feeling.   You have severe chest or abdominal pain, vomiting, or breathing problems.  MAKE SURE YOU:   Understand these instructions.   Will watch your condition.   Will get help right away if you are not doing well or get worse.  Document Released: 03/15/2005 Document Revised: 03/04/2011 Document Reviewed: 11/03/2007 [employer] Patient Information 2012 [employer]. 

## 2011-08-20 NOTE — Assessment & Plan Note (Signed)
He has good rate and rhythm control, he will stop the amlodipine since he is on another CCB and is having SE's (constipation)

## 2011-08-20 NOTE — Assessment & Plan Note (Signed)
He has been clean and sober for 4 months

## 2011-08-30 ENCOUNTER — Other Ambulatory Visit: Payer: Self-pay | Admitting: Internal Medicine

## 2011-09-21 ENCOUNTER — Other Ambulatory Visit: Payer: Self-pay | Admitting: Internal Medicine

## 2011-12-21 ENCOUNTER — Encounter: Payer: Self-pay | Admitting: Cardiology

## 2011-12-21 ENCOUNTER — Ambulatory Visit (INDEPENDENT_AMBULATORY_CARE_PROVIDER_SITE_OTHER): Payer: BC Managed Care – PPO | Admitting: Cardiology

## 2011-12-21 VITALS — BP 120/64 | HR 67 | Resp 18 | Ht 68.0 in | Wt 135.0 lb

## 2011-12-21 DIAGNOSIS — I10 Essential (primary) hypertension: Secondary | ICD-10-CM

## 2011-12-21 DIAGNOSIS — I4891 Unspecified atrial fibrillation: Secondary | ICD-10-CM

## 2011-12-21 MED ORDER — AMLODIPINE BESYLATE 5 MG PO TABS: 5.0000 mg | ORAL_TABLET | Freq: Every day | ORAL | Status: DC

## 2011-12-21 NOTE — Progress Notes (Signed)
Roy Long Date of Birth: [birth year]   History of Present Illness: Roy Long is seen today for followup. He states he's been doing very well. He has abstained from alcohol for the past 8 months. He denies any episodes of atrial fibrillation. He has a rare skipped beat. His amlodipine was discontinued because of complaints of constipation. Since it was discontinued his blood pressure has been consistently in the 140s systolic. His constipation has not improved.  Current Outpatient Prescriptions on File Prior to Visit  Medication Sig Dispense Refill  . acyclovir (ZOVIRAX) 400 MG tablet TAKE 1 TABLET THREE TIMES A DAY  90 tablet  3  . aspirin 81 MG tablet Take 81 mg by mouth daily.        . citalopram (CELEXA) 20 MG tablet Take 20 mg by mouth daily.        Marland Kitchen diltiazem (CARDIZEM CD) 180 MG 24 hr capsule Take 1 capsule (180 mg total) by mouth daily.  90 capsule  3  . finasteride (PROSCAR) 5 MG tablet Take 5 mg by mouth daily.        . flecainide (TAMBOCOR) 100 MG tablet Take 1 tablet (100 mg total) by mouth 2 (two) times daily.  180 tablet  3  . levothyroxine (SYNTHROID, LEVOTHROID) 25 MCG tablet TAKE 1 TABLET BY MOUTH EVERY DAY  90 tablet  1  . mometasone (NASONEX) 50 MCG/ACT nasal spray Place 2 sprays into the nose as needed.        Marland Kitchen omeprazole (PRILOSEC) 40 MG capsule Take 20 mg by mouth daily.       . fish oil-omega-3 fatty acids 1000 MG capsule Take 1,200 mg by mouth daily.        . Multiple Vitamin (MULTIVITAMIN) tablet Take 1 tablet by mouth daily.          Allergies  Allergen Reactions  . Amlodipine     constipation    Past Medical History  Diagnosis Date  . Atrial fibrillation   . Hypertension   . Alcohol dependence   . GERD (gastroesophageal reflux disease)   . History of kidney stones   . Cancer of skin, squamous cell     hand, chest  . Macular degeneration     Past Surgical History  Procedure Date  . Cervical fusion     History  Smoking status  . Former  Smoker  . Quit date: 03/30/1983  Smokeless tobacco  . Not on file    History  Alcohol Use No    Family History  Problem Relation Age of Onset  . Heart failure Mother   . Heart attack Father   . Stroke Brother     X2  . Cancer Neg Hx   . Heart disease Neg Hx   . Hyperlipidemia Neg Hx   . Hypertension Neg Hx   . Diabetes Neg Hx     Review of Systems:  As noted in history of present illness. All other systems were reviewed and are negative.  Physical Exam: BP 120/64  Pulse 67  Resp 18  Ht 5\' 8"  (1.727 m)  Wt 135 lb (61.236 kg)  BMI 20.53 kg/m2  SpO2 98% He is a thin white male in no acute distress. HEENT exam is unremarkable. He has no JVD or bruits. Lungs are clear. Cardiac exam reveals a regular rate and rhythm without gallop or murmur. Abdomen is soft and nontender. He has no edema. LABORATORY DATA: ECG today demonstrates normal sinus rhythm. 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  Assessment / Plan: 1. Atrial fibrillation. Still well controlled on flecainide. We discussed the potential of reducing his dose since it has been a long time since he has had a sustained episode of arrhythmia. The fact that he has abstained from alcohol clearly reduces his chances of atrial fibrillation. However, after discussion we elected to leave his dose as is. He understands that there is a risk that he could break through if we dropped his dose.  2. Hypertension. Blood pressure today is consistent with his home readings. I recommended resuming his amlodipine at 5 mg daily. I think the diltiazem is a much likelier offender as far as his constipation is concerned. If his blood pressure normalizes with amlodipine we could consider stopping his diltiazem.  3. Alcohol dependence, currently abstinent.

## 2011-12-21 NOTE — Patient Instructions (Addendum)
Start back on amlodipine  If your blood pressure comes under good control we can try stopping the diltiazem  Continue flecainide.

## 2012-01-12 NOTE — Addendum Note (Signed)
Addended by: Marrion Coy L on: 01/12/2012 10:34 AM   Modules accepted: Orders

## 2012-01-18 ENCOUNTER — Ambulatory Visit (INDEPENDENT_AMBULATORY_CARE_PROVIDER_SITE_OTHER): Payer: BC Managed Care – PPO

## 2012-01-18 DIAGNOSIS — Z23 Encounter for immunization: Secondary | ICD-10-CM

## 2012-02-16 ENCOUNTER — Other Ambulatory Visit (INDEPENDENT_AMBULATORY_CARE_PROVIDER_SITE_OTHER): Payer: BC Managed Care – PPO

## 2012-02-16 ENCOUNTER — Encounter: Payer: Self-pay | Admitting: Internal Medicine

## 2012-02-16 ENCOUNTER — Ambulatory Visit (INDEPENDENT_AMBULATORY_CARE_PROVIDER_SITE_OTHER): Payer: BC Managed Care – PPO | Admitting: Internal Medicine

## 2012-02-16 VITALS — BP 122/74 | HR 65 | Temp 97.2°F | Resp 16 | Wt 133.2 lb

## 2012-02-16 DIAGNOSIS — E039 Hypothyroidism, unspecified: Secondary | ICD-10-CM

## 2012-02-16 DIAGNOSIS — Z23 Encounter for immunization: Secondary | ICD-10-CM

## 2012-02-16 DIAGNOSIS — I10 Essential (primary) hypertension: Secondary | ICD-10-CM

## 2012-02-16 DIAGNOSIS — I4891 Unspecified atrial fibrillation: Secondary | ICD-10-CM

## 2012-02-16 LAB — BASIC METABOLIC PANEL
BUN: 14 mg/dL (ref 6–23)
Calcium: 9.5 mg/dL (ref 8.4–10.5)
Creatinine, Ser: 1 mg/dL (ref 0.4–1.5)
GFR: 83.94 mL/min (ref >60.00)
Glucose, Bld: 97 mg/dL (ref 70–99)
Potassium: 4 mEq/L (ref 3.5–5.1)

## 2012-02-16 LAB — TSH: TSH: 2.66 u[IU]/mL (ref 0.35–5.50)

## 2012-02-16 NOTE — Assessment & Plan Note (Signed)
I will recheck the TSH and will adjust the dose if needed

## 2012-02-16 NOTE — Progress Notes (Signed)
  Subjective:    Patient ID: Roy Long, male    DOB: 04/04/1950, 61 y.o.   MRN: [id number]  Thyroid Problem Presents for follow-up visit. Symptoms include fatigue. Patient reports no anxiety, cold intolerance, constipation, depressed mood, diaphoresis, diarrhea, dry skin, hair loss, heat intolerance, hoarse voice, leg swelling, nail problem, palpitations, tremors, visual change, weight gain or weight loss. The symptoms have been stable.      Review of Systems  Constitutional: Positive for fatigue. Negative for fever, chills, weight loss, weight gain, diaphoresis, activity change, appetite change and unexpected weight change.  HENT: Negative.  Negative for hoarse voice.   Eyes: Negative.   Respiratory: Negative for apnea, cough, choking, chest tightness, shortness of breath, wheezing and stridor.   Cardiovascular: Negative for chest pain, palpitations and leg swelling.  Gastrointestinal: Negative for nausea, abdominal pain, diarrhea and constipation.  Genitourinary: Negative.   Musculoskeletal: Negative for myalgias, back pain, joint swelling, arthralgias and gait problem.  Skin: Negative for color change, pallor, rash and wound.  Neurological: Negative for dizziness, tremors, seizures, syncope, facial asymmetry, speech difficulty, weakness, light-headedness, numbness and headaches.  Hematological: Negative for cold intolerance, heat intolerance and adenopathy. Does not bruise/bleed easily.  Psychiatric/Behavioral: Negative.        Objective:   Physical Exam  Vitals reviewed. Constitutional: He is oriented to person, place, and time. He appears well-developed and well-nourished. No distress.  HENT:  Head: Normocephalic and atraumatic.  Mouth/Throat: Oropharynx is clear and moist. No oropharyngeal exudate.  Eyes: Conjunctivae normal are normal. Right eye exhibits no discharge. Left eye exhibits no discharge. No scleral icterus.  Neck: Normal range of motion. Neck supple. No JVD  present. No tracheal deviation present. No thyromegaly present.  Cardiovascular: Normal rate, regular rhythm, normal heart sounds and intact distal pulses.  Exam reveals no gallop and no friction rub.   No murmur heard. Pulmonary/Chest: Effort normal and breath sounds normal. No stridor. No respiratory distress. He has no wheezes. He has no rales. He exhibits no tenderness.  Abdominal: Soft. Bowel sounds are normal. He exhibits no distension and no mass. There is no tenderness. There is no rebound and no guarding.  Musculoskeletal: Normal range of motion. He exhibits no edema and no tenderness.  Lymphadenopathy:    He has no cervical adenopathy.  Neurological: He is oriented to person, place, and time.  Skin: Skin is warm and dry. No rash noted. He is not diaphoretic. No erythema. No pallor.  Psychiatric: He has a normal mood and affect. His behavior is normal. Judgment and thought content normal.      Lab Results  Component Value Date   WBC 5.7 04/30/2009   HGB 15.2 04/30/2009   HCT 43.8 04/30/2009   PLT 227 04/30/2009   GLUCOSE 92 08/20/2011   CHOL 179 08/20/2011   TRIG 98.0 08/20/2011   HDL 40.10 08/20/2011   LDLCALC 119* 08/20/2011   ALT 16 04/15/2011   AST 25 04/15/2011   NA 139 08/20/2011   K 3.8 08/20/2011   CL 105 08/20/2011   CREATININE 1.0 08/20/2011   BUN 12 08/20/2011   CO2 27 08/20/2011   TSH 2.20 08/20/2011   PSA 0.17 04/27/2011   INR 1.0 04/15/2011   HGBA1C 5.1 04/15/2011      Assessment & Plan:

## 2012-02-16 NOTE — Assessment & Plan Note (Signed)
His BP is well controlled 

## 2012-02-16 NOTE — Assessment & Plan Note (Signed)
He has good rate and rhythm control 

## 2012-02-16 NOTE — Patient Instructions (Signed)

## 2012-03-15 ENCOUNTER — Other Ambulatory Visit: Payer: Self-pay | Admitting: Internal Medicine

## 2012-03-15 ENCOUNTER — Other Ambulatory Visit: Payer: Self-pay | Admitting: Cardiology

## 2012-03-15 MED ORDER — FLECAINIDE ACETATE 100 MG PO TABS: 100.0000 mg | ORAL_TABLET | Freq: Two times a day (BID) | ORAL | Status: DC

## 2012-03-15 MED ORDER — DILTIAZEM HCL ER COATED BEADS 180 MG PO CP24: 180.0000 mg | ORAL_CAPSULE | Freq: Every day | ORAL | Status: DC

## 2012-06-24 ENCOUNTER — Other Ambulatory Visit: Payer: Self-pay | Admitting: Cardiology

## 2012-07-03 ENCOUNTER — Other Ambulatory Visit: Payer: Self-pay | Admitting: *Deleted

## 2012-07-03 MED ORDER — DILTIAZEM HCL ER COATED BEADS 180 MG PO CP24: 180.0000 mg | ORAL_CAPSULE | Freq: Every day | ORAL | Status: DC

## 2012-07-04 ENCOUNTER — Encounter: Payer: Self-pay | Admitting: Internal Medicine

## 2012-07-04 MED ORDER — MOMETASONE FUROATE 50 MCG/ACT NA SUSP: 2.0000 | NASAL | Status: DC | PRN

## 2012-07-05 ENCOUNTER — Encounter: Payer: Self-pay | Admitting: Internal Medicine

## 2012-08-08 ENCOUNTER — Ambulatory Visit (INDEPENDENT_AMBULATORY_CARE_PROVIDER_SITE_OTHER): Payer: BC Managed Care – PPO | Admitting: Cardiology

## 2012-08-08 ENCOUNTER — Encounter: Payer: Self-pay | Admitting: Cardiology

## 2012-08-08 VITALS — BP 140/70 | HR 65 | Ht 68.0 in | Wt 134.4 lb

## 2012-08-08 DIAGNOSIS — I10 Essential (primary) hypertension: Secondary | ICD-10-CM

## 2012-08-08 DIAGNOSIS — I4891 Unspecified atrial fibrillation: Secondary | ICD-10-CM

## 2012-08-08 NOTE — Progress Notes (Signed)
Roy Long Date of Birth: 1951-01-01   History of Present Illness: Roy Long is seen today for followup. He states he's been doing very well. He has abstained from alcohol for the past 16 months and participate actively in [place]. He had one episode of rapid heartbeat in early March when he had an intestinal virus and nausea and vomiting. It lasted about 3 hours and then resolved. Otherwise he has done very well. He did have cataract surgery on the left and was told that he had early macular degeneration.  Current Outpatient Prescriptions on File Prior to Visit  Medication Sig Dispense Refill  . acyclovir (ZOVIRAX) 400 MG tablet TAKE 1 TABLET THREE TIMES A DAY  90 tablet  3  . amLODipine (NORVASC) 5 MG tablet Take 1 tablet (5 mg total) by mouth daily.  90 tablet  3  . aspirin 81 MG tablet Take 81 mg by mouth daily.        . cholecalciferol (VITAMIN D) 1000 UNITS tablet Take 2,000 Units by mouth daily.      . citalopram (CELEXA) 20 MG tablet Take 20 mg by mouth daily.        . citalopram (CELEXA) 40 MG tablet TAKE 1/2 TABLET DAILY  90 tablet  1  . finasteride (PROSCAR) 5 MG tablet TAKE 1 TABLET BY MOUTH DAILY  90 tablet  3  . flecainide (TAMBOCOR) 100 MG tablet TAKE 1 TABLET BY MOUTH TWICE A DAY  180 tablet  0  . levothyroxine (SYNTHROID, LEVOTHROID) 25 MCG tablet TAKE 1 TABLET BY MOUTH EVERY DAY  90 tablet  1  . mometasone (NASONEX) 50 MCG/ACT nasal spray Place 2 sprays into the nose as needed.  17 g  5  . NON FORMULARY Take 4 tablets by mouth 2 (two) times daily. Supplement.      Marland Kitchen omeprazole (PRILOSEC) 40 MG capsule Take 20 mg by mouth daily.        No current facility-administered medications on file prior to visit.    No Known Allergies  Past Medical History  Diagnosis Date  . Atrial fibrillation   . Hypertension   . Alcohol dependence   . GERD (gastroesophageal reflux disease)   . History of kidney stones   . Cancer of skin, squamous cell     hand, chest  . Macular  degeneration     Past Surgical History  Procedure Laterality Date  . Cervical fusion      History  Smoking status  . Former Smoker  . Quit date: 03/30/1983  Smokeless tobacco  . Never Used    History  Alcohol Use No    Family History  Problem Relation Age of Onset  . Heart failure Mother   . Heart attack Father   . Stroke Brother     X2  . Cancer Neg Hx   . Heart disease Neg Hx   . Hyperlipidemia Neg Hx   . Hypertension Neg Hx   . Diabetes Neg Hx     Review of Systems:  As noted in history of present illness. All other systems were reviewed and are negative.  Physical Exam: BP 140/70  Pulse 65  Ht 5\' 8"  (1.727 m)  Wt 134 lb 6.4 oz (60.963 kg)  BMI 20.44 kg/m2  SpO2 99% He is a thin white male in no acute distress. HEENT exam is unremarkable. He has no JVD or bruits. Lungs are clear. Cardiac exam reveals a regular rate and rhythm without gallop or murmur.  Abdomen is soft and nontender. He has no edema. LABORATORY DATA:   Assessment / Plan: 1. Atrial fibrillation. Still well controlled on flecainide. He would like to reduce his medication and I recommended reducing his flecainide to 50 mg twice a day. If he has significant breakthrough we can go back up on the dose.  2. Hypertension. Well controlled. We'll stop diltiazem at this point and continue amlodipine only.  3. Alcohol dependence, currently abstinent.

## 2012-08-08 NOTE — Patient Instructions (Signed)
Stop taking diltiazem  Reduce flecainide to 50 mg twice a day.  I will see you in 6 months.

## 2012-08-28 ENCOUNTER — Other Ambulatory Visit: Payer: Self-pay | Admitting: Internal Medicine

## 2012-09-18 ENCOUNTER — Encounter (INDEPENDENT_AMBULATORY_CARE_PROVIDER_SITE_OTHER): Payer: BC Managed Care – PPO | Admitting: Ophthalmology

## 2012-09-18 DIAGNOSIS — I10 Essential (primary) hypertension: Secondary | ICD-10-CM

## 2012-09-18 DIAGNOSIS — H35039 Hypertensive retinopathy, unspecified eye: Secondary | ICD-10-CM

## 2012-09-18 DIAGNOSIS — H43819 Vitreous degeneration, unspecified eye: Secondary | ICD-10-CM

## 2012-09-18 DIAGNOSIS — H3530 Unspecified macular degeneration: Secondary | ICD-10-CM

## 2012-09-18 DIAGNOSIS — H2510 Age-related nuclear cataract, unspecified eye: Secondary | ICD-10-CM

## 2012-09-20 ENCOUNTER — Other Ambulatory Visit: Payer: Self-pay | Admitting: Internal Medicine

## 2012-10-21 ENCOUNTER — Other Ambulatory Visit: Payer: Self-pay | Admitting: Cardiology

## 2012-10-23 NOTE — Telephone Encounter (Signed)
Roy M Swaziland, MD at 08/08/2012 10:21 AM flecainide (TAMBOCOR) 100 MG tablet  TAKE 1 TABLET BY MOUTH TWICE A DAY  180 tablet  Assessment / Plan:  1. Atrial fibrillation. Still well controlled on flecainide. He would like to reduce his medication and I recommended reducing his flecainide to 50 mg twice a day. If he has significant breakthrough we can go back up on the dose.

## 2012-12-28 ENCOUNTER — Other Ambulatory Visit: Payer: Self-pay | Admitting: Dermatology

## 2013-01-17 ENCOUNTER — Other Ambulatory Visit: Payer: Self-pay | Admitting: Cardiology

## 2013-02-01 ENCOUNTER — Other Ambulatory Visit: Payer: Self-pay

## 2013-02-19 ENCOUNTER — Ambulatory Visit (INDEPENDENT_AMBULATORY_CARE_PROVIDER_SITE_OTHER): Payer: BC Managed Care – PPO | Admitting: Nurse Practitioner

## 2013-02-19 ENCOUNTER — Encounter: Payer: Self-pay | Admitting: Nurse Practitioner

## 2013-02-19 VITALS — BP 110/70 | HR 62 | Ht 68.0 in | Wt 135.4 lb

## 2013-02-19 DIAGNOSIS — I4891 Unspecified atrial fibrillation: Secondary | ICD-10-CM

## 2013-02-19 DIAGNOSIS — I10 Essential (primary) hypertension: Secondary | ICD-10-CM

## 2013-02-19 MED ORDER — AMLODIPINE BESYLATE 5 MG PO TABS: ORAL_TABLET | ORAL | Status: DC

## 2013-02-19 NOTE — Patient Instructions (Addendum)
Stay on your current medicines - I did refill your Norvasc today   See Dr. Swaziland in 6 months  Call the [facility] office at [phone] if you have any questions, problems or concerns.

## 2013-02-19 NOTE — Progress Notes (Signed)
[name] Date of Birth: 05/05/1950 Medical Record #[id number]  History of Present Illness: Mr. Frick is seen back today for his 6 month check. Seen for Dr. Swaziland. He has PAF - maintained in sinus on Flecainide, HTN, hypothryoidism, GERD and prior alcohol abuse. He is active in [place].   Last seen here in May - was felt to be doing ok. Flecainide was reduced. Diltiazem was also stopped due to well controlled BP.   Comes back today. Here alone. Doing ok. Has now been sober for 22 months. Continues to go to [employer] every day. Feels good. Happy. Has only had one spell of atrial fib - on July 21 - took a dose of Diltiazem and 100 mg of Flecainide and it resolved after 7 hours. No other spells. No chest pain. Not short of breath. Not dizzy or lightheaded. Feels good on his medicines. His labs are checked by primary care.   Current Outpatient Prescriptions  Medication Sig Dispense Refill  . acyclovir (ZOVIRAX) 400 MG tablet TAKE 1 TABLET THREE TIMES A DAY  90 tablet  3  . amLODipine (NORVASC) 5 MG tablet TAKE 1 TABLET (5 MG TOTAL) BY MOUTH DAILY.  90 tablet  0  . aspirin 81 MG tablet Take 81 mg by mouth daily.        . citalopram (CELEXA) 40 MG tablet TAKE 1/2 TABLET DAILY  90 tablet  1  . finasteride (PROSCAR) 5 MG tablet TAKE 1 TABLET BY MOUTH DAILY  90 tablet  3  . flecainide (TAMBOCOR) 100 MG tablet Take 0.5 tablets (50 mg total) by mouth 2 (two) times daily.  90 tablet  3  . levothyroxine (SYNTHROID, LEVOTHROID) 25 MCG tablet TAKE 1 TABLET BY MOUTH EVERY DAY  90 tablet  1  . mometasone (NASONEX) 50 MCG/ACT nasal spray Place 2 sprays into the nose as needed.  17 g  5  . Multiple Vitamins-Minerals (PRESERVISION/LUTEIN PO) Take by mouth as directed.      Marland Kitchen omeprazole (PRILOSEC) 40 MG capsule Take 20 mg by mouth daily.       . Vitamins-Lipotropics (LIPOFLAVONOID PO) Take by mouth.       No current facility-administered medications for this visit.    No Known Allergies  Past Medical  History  Diagnosis Date  . Atrial fibrillation   . Hypertension   . Alcohol dependence   . GERD (gastroesophageal reflux disease)   . History of kidney stones   . Cancer of skin, squamous cell     hand, chest  . Macular degeneration     Past Surgical History  Procedure Laterality Date  . Cervical fusion      History  Smoking status  . Former Smoker  . Quit date: 03/30/1983  Smokeless tobacco  . Never Used    History  Alcohol Use No    Family History  Problem Relation Age of Onset  . Heart failure Mother   . Heart attack Father   . Stroke Brother     X2  . Cancer Neg Hx   . Heart disease Neg Hx   . Hyperlipidemia Neg Hx   . Hypertension Neg Hx   . Diabetes Neg Hx     Review of Systems: The review of systems is per the HPI.  All other systems were reviewed and are negative.  Physical Exam: BP 110/70  Pulse 62  Ht 5\' 8"  (1.727 m)  Wt 135 lb 6.4 oz (61.417 kg)  BMI 20.59 kg/m2  SpO2 99% Patient is very pleasant and in no acute distress. Skin is warm and dry. Color is normal.  HEENT is unremarkable. Normocephalic/atraumatic. PERRL. Sclera are nonicteric. Neck is supple. No masses. No JVD. Lungs are clear. Cardiac exam shows a regular rate and rhythm. Abdomen is soft. Extremities are without edema. Gait and ROM are intact. No gross neurologic deficits noted.  LABORATORY DATA: EKG shows sinus rhythm. Septal Q's - tracing is unchanged.   Lab Results  Component Value Date   WBC 5.7 04/30/2009   HGB 15.2 04/30/2009   HCT 43.8 04/30/2009   PLT 227 04/30/2009   GLUCOSE 97 02/16/2012   CHOL 179 08/20/2011   TRIG 98.0 08/20/2011   HDL 40.10 08/20/2011   LDLCALC 119* 08/20/2011   ALT 16 04/15/2011   AST 25 04/15/2011   NA 137 02/16/2012   K 4.0 02/16/2012   CL 103 02/16/2012   CREATININE 1.0 02/16/2012   BUN 14 02/16/2012   CO2 28 02/16/2012   TSH 2.66 02/16/2012   PSA 0.17 04/27/2011   INR 1.0 04/15/2011   HGBA1C 5.1 04/15/2011     Assessment / Plan: 1. HTN - BP  looks great - I have refilled his Norvasc today.  2. PAF - one episode in the past 6 months. No change in his current regimen.  3. Alcohol abuse - he continues to abstain.   See him back in 6 months.   Patient is agreeable to this plan and will call if any problems develop in the interim.   Rosalio Macadamia, RN, ANP-C [facility] [address] [place], [place]  [address]

## 2013-03-16 ENCOUNTER — Other Ambulatory Visit: Payer: Self-pay | Admitting: Internal Medicine

## 2013-03-23 ENCOUNTER — Telehealth: Payer: Self-pay | Admitting: Cardiology

## 2013-03-23 ENCOUNTER — Other Ambulatory Visit: Payer: Self-pay | Admitting: Internal Medicine

## 2013-03-23 MED ORDER — FLECAINIDE ACETATE 50 MG PO TABS: 50.0000 mg | ORAL_TABLET | Freq: Two times a day (BID) | ORAL | Status: DC

## 2013-03-23 NOTE — Telephone Encounter (Signed)
New problem       Pt called, asked to change the [place] on his Flecainide from 100 mg to 50 mg so that he would not have to cut them.  The Amount now would be 180 tab. Every 3 months.

## 2013-03-23 NOTE — Telephone Encounter (Signed)
Wanted to know if he could change his Flecainide from 100 mg to 50 mg so he wouldn't have to cut them in half.  Taking BID. Advised would [facility] to [facility] at [address].

## 2013-06-04 ENCOUNTER — Other Ambulatory Visit: Payer: Self-pay | Admitting: Dermatology

## 2013-06-18 ENCOUNTER — Other Ambulatory Visit: Payer: Self-pay | Admitting: Internal Medicine

## 2013-06-20 ENCOUNTER — Other Ambulatory Visit: Payer: Self-pay | Admitting: Internal Medicine

## 2013-06-27 ENCOUNTER — Other Ambulatory Visit: Payer: Self-pay | Admitting: Internal Medicine

## 2013-06-29 ENCOUNTER — Other Ambulatory Visit: Payer: Self-pay | Admitting: Internal Medicine

## 2013-09-10 ENCOUNTER — Other Ambulatory Visit: Payer: Self-pay | Admitting: Internal Medicine

## 2013-09-12 ENCOUNTER — Other Ambulatory Visit: Payer: Self-pay | Admitting: Internal Medicine

## 2013-09-14 ENCOUNTER — Other Ambulatory Visit: Payer: Self-pay | Admitting: Internal Medicine

## 2013-09-18 ENCOUNTER — Ambulatory Visit (INDEPENDENT_AMBULATORY_CARE_PROVIDER_SITE_OTHER): Payer: Medicare Other | Admitting: Ophthalmology

## 2013-09-18 DIAGNOSIS — H43819 Vitreous degeneration, unspecified eye: Secondary | ICD-10-CM

## 2013-09-18 DIAGNOSIS — H35039 Hypertensive retinopathy, unspecified eye: Secondary | ICD-10-CM

## 2013-09-18 DIAGNOSIS — I10 Essential (primary) hypertension: Secondary | ICD-10-CM

## 2013-09-18 DIAGNOSIS — H3530 Unspecified macular degeneration: Secondary | ICD-10-CM

## 2013-09-18 DIAGNOSIS — H2510 Age-related nuclear cataract, unspecified eye: Secondary | ICD-10-CM

## 2013-10-04 ENCOUNTER — Other Ambulatory Visit: Payer: Self-pay | Admitting: Internal Medicine

## 2013-10-09 ENCOUNTER — Other Ambulatory Visit: Payer: Self-pay | Admitting: Internal Medicine

## 2013-10-10 ENCOUNTER — Telehealth: Payer: Self-pay | Admitting: Internal Medicine

## 2013-10-10 MED ORDER — LEVOTHYROXINE SODIUM 25 MCG PO TABS
25.0000 ug | ORAL_TABLET | Freq: Every day | ORAL | Status: DC
Start: 2013-10-10 — End: 2013-11-09

## 2013-10-10 NOTE — Telephone Encounter (Signed)
Patient has appt July 29th with Dr. Ronnald Ramp.  Patient will be out of levothyroxine 4 days before that appt. Patient would like a few tablets called in to get him through until his appt.  [facility] on [address].

## 2013-10-24 ENCOUNTER — Encounter: Payer: Self-pay | Admitting: Internal Medicine

## 2013-10-24 ENCOUNTER — Other Ambulatory Visit (INDEPENDENT_AMBULATORY_CARE_PROVIDER_SITE_OTHER): Payer: 59

## 2013-10-24 ENCOUNTER — Ambulatory Visit (INDEPENDENT_AMBULATORY_CARE_PROVIDER_SITE_OTHER): Payer: 59 | Admitting: Internal Medicine

## 2013-10-24 VITALS — BP 120/72 | HR 82 | Temp 98.3°F | Resp 16 | Ht 68.0 in | Wt 134.4 lb

## 2013-10-24 DIAGNOSIS — E039 Hypothyroidism, unspecified: Secondary | ICD-10-CM

## 2013-10-24 DIAGNOSIS — R7401 Elevation of levels of liver transaminase levels: Secondary | ICD-10-CM

## 2013-10-24 DIAGNOSIS — I10 Essential (primary) hypertension: Secondary | ICD-10-CM

## 2013-10-24 DIAGNOSIS — R7402 Elevation of levels of lactic acid dehydrogenase (LDH): Secondary | ICD-10-CM

## 2013-10-24 DIAGNOSIS — R740 Nonspecific elevation of levels of transaminase and lactic acid dehydrogenase [LDH]: Secondary | ICD-10-CM

## 2013-10-24 DIAGNOSIS — F418 Other specified anxiety disorders: Secondary | ICD-10-CM | POA: Insufficient documentation

## 2013-10-24 DIAGNOSIS — F341 Dysthymic disorder: Secondary | ICD-10-CM

## 2013-10-24 DIAGNOSIS — I480 Paroxysmal atrial fibrillation: Secondary | ICD-10-CM

## 2013-10-24 DIAGNOSIS — I4891 Unspecified atrial fibrillation: Secondary | ICD-10-CM

## 2013-10-24 LAB — COMPREHENSIVE METABOLIC PANEL
ALBUMIN: 4.2 g/dL (ref 3.5–5.2)
ALK PHOS: 64 U/L (ref 39–117)
ALT: 18 U/L (ref 0–53)
AST: 19 U/L (ref 0–37)
BUN: 12 mg/dL (ref 6–23)
CHLORIDE: 104 meq/L (ref 96–112)
CO2: 29 mEq/L (ref 19–32)
Calcium: 9.1 mg/dL (ref 8.4–10.5)
Creatinine, Ser: 1 mg/dL (ref 0.4–1.5)
GFR: 82.5 mL/min (ref >60.00)
GLUCOSE: 100 mg/dL — AB (ref 70–99)
POTASSIUM: 3.8 meq/L (ref 3.5–5.1)
Sodium: 138 mEq/L (ref 135–145)
Total Bilirubin: 0.5 mg/dL (ref 0.2–1.2)
Total Protein: 6.6 g/dL (ref 6.0–8.3)

## 2013-10-24 LAB — CBC WITH DIFFERENTIAL/PLATELET
BASOS ABS: 0 10*3/uL (ref 0.0–0.1)
BASOS PCT: 0.5 % (ref 0.0–3.0)
Eosinophils Absolute: 0.2 10*3/uL (ref 0.0–0.7)
Eosinophils Relative: 3 % (ref 0.0–5.0)
HCT: 42.8 % (ref 39.0–52.0)
Hemoglobin: 14.6 g/dL (ref 13.0–17.0)
LYMPHS PCT: 43.1 % (ref 12.0–46.0)
Lymphs Abs: 2.9 10*3/uL (ref 0.7–4.0)
MCHC: 34 g/dL (ref 30.0–36.0)
MCV: 90.2 fl (ref 78.0–100.0)
MONO ABS: 0.5 10*3/uL (ref 0.1–1.0)
Monocytes Relative: 7.2 % (ref 3.0–12.0)
NEUTROS PCT: 46.2 % (ref 43.0–77.0)
Neutro Abs: 3.2 10*3/uL (ref 1.4–7.7)
Platelets: 263 10*3/uL (ref 150.0–400.0)
RBC: 4.75 Mil/uL (ref 4.22–5.81)
RDW: 12.5 % (ref 11.5–15.5)
WBC: 6.8 10*3/uL (ref 4.0–10.5)

## 2013-10-24 LAB — TSH: TSH: 2.3 u[IU]/mL (ref 0.35–4.50)

## 2013-10-24 MED ORDER — CITALOPRAM HYDROBROMIDE 10 MG PO TABS: 10.0000 mg | ORAL_TABLET | Freq: Every day | ORAL | Status: DC

## 2013-10-24 NOTE — Assessment & Plan Note (Signed)
I will recheck his TSH and will adjust his dose if needed 

## 2013-10-24 NOTE — Assessment & Plan Note (Signed)
I will recheck his LFT's and will screen him for HIV and viral hepatitis

## 2013-10-24 NOTE — Patient Instructions (Signed)
Hypothyroidism The thyroid is a large gland located in the lower front of your neck. The thyroid gland helps control metabolism. Metabolism is how your body handles food. It controls metabolism with the hormone thyroxine. When this gland is underactive (hypothyroid), it produces too little hormone.  CAUSES These include:   Absence or destruction of thyroid tissue.  Goiter due to iodine deficiency.  Goiter due to medications.  Congenital defects (since birth).  Problems with the pituitary. This causes a lack of TSH (thyroid stimulating hormone). This hormone tells the thyroid to turn out more hormone. SYMPTOMS  Lethargy (feeling as though you have no energy)  Cold intolerance  Weight gain (in spite of normal food intake)  Dry skin  Coarse hair  Menstrual irregularity (if severe, may lead to infertility)  Slowing of thought processes Cardiac problems are also caused by insufficient amounts of thyroid hormone. Hypothyroidism in the newborn is cretinism, and is an extreme form. It is important that this form be treated adequately and immediately or it will lead rapidly to retarded physical and mental development. DIAGNOSIS  To prove hypothyroidism, your caregiver may do blood tests and ultrasound tests. Sometimes the signs are hidden. It may be necessary for your caregiver to watch this illness with blood tests either before or after diagnosis and treatment. TREATMENT  Low levels of thyroid hormone are increased by using synthetic thyroid hormone. This is a safe, effective treatment. It usually takes about four weeks to gain the full effects of the medication. After you have the full effect of the medication, it will generally take another four weeks for problems to leave. Your caregiver may start you on low doses. If you have had heart problems the dose may be gradually increased. It is generally not an emergency to get rapidly to normal. HOME CARE INSTRUCTIONS   Take your  medications as your caregiver suggests. Let your caregiver know of any medications you are taking or start taking. Your caregiver will help you with dosage schedules.  As your condition improves, your dosage needs may increase. It will be necessary to have continuing blood tests as suggested by your caregiver.  Report all suspected medication side effects to your caregiver. SEEK MEDICAL CARE IF: Seek medical care if you develop:  Sweating.  Tremulousness (tremors).  Anxiety.  Rapid weight loss.  Heat intolerance.  Emotional swings.  Diarrhea.  Weakness. SEEK IMMEDIATE MEDICAL CARE IF:  You develop chest pain, an irregular heart beat (palpitations), or a rapid heart beat. MAKE SURE YOU:   Understand these instructions.  Will watch your condition.  Will get help right away if you are not doing well or get worse. Document Released: 03/15/2005 Document Revised: 06/07/2011 Document Reviewed: 11/03/2007 ExitCare Patient Information 2015 [employer]. This information is not intended to replace advice given to you by your health care provider. Make sure you discuss any questions you have with your health care provider.  

## 2013-10-24 NOTE — Progress Notes (Signed)
Pre visit review using our clinic review tool, if applicable. No additional management support is needed unless otherwise documented below in the visit note. 

## 2013-10-24 NOTE — Progress Notes (Signed)
   Subjective:    Patient ID: Roy Long, male    DOB: 01/03/1951, 63 y.o.   MRN: [id number]  Thyroid Problem Presents for follow-up visit. Symptoms include constipation and fatigue. Patient reports no anxiety, cold intolerance, depressed mood, diaphoresis, diarrhea, dry skin, hair loss, heat intolerance, hoarse voice, leg swelling, nail problem, palpitations, tremors, visual change, weight gain or weight loss. The symptoms have been worsening. Past treatments include levothyroxine. The treatment provided moderate relief.      Review of Systems  Constitutional: Positive for fatigue. Negative for fever, chills, weight loss, weight gain, diaphoresis, activity change, appetite change and unexpected weight change.  HENT: Negative.  Negative for hoarse voice.   Eyes: Negative.   Respiratory: Negative.  Negative for cough, choking, chest tightness, shortness of breath and stridor.   Cardiovascular: Negative.  Negative for chest pain, palpitations and leg swelling.  Gastrointestinal: Positive for constipation. Negative for nausea, vomiting, abdominal pain, diarrhea, blood in stool, abdominal distention, anal bleeding and rectal pain.  Endocrine: Negative.  Negative for cold intolerance and heat intolerance.  Genitourinary: Negative.   Musculoskeletal: Negative.  Negative for arthralgias, back pain, myalgias and neck pain.  Skin: Negative.  Negative for rash.  Allergic/Immunologic: Negative.   Neurological: Negative.  Negative for tremors.  Hematological: Negative.  Negative for adenopathy. Does not bruise/bleed easily.  Psychiatric/Behavioral: Negative.  Negative for suicidal ideas, hallucinations, behavioral problems, confusion, sleep disturbance, self-injury, dysphoric mood, decreased concentration and agitation. The patient is not nervous/anxious and is not hyperactive.        Objective:   Physical Exam  Vitals reviewed. Constitutional: He is oriented to person, place, and time. He  appears well-developed and well-nourished. No distress.  HENT:  Head: Normocephalic and atraumatic.  Mouth/Throat: Oropharynx is clear and moist. No oropharyngeal exudate.  Eyes: Conjunctivae are normal. Right eye exhibits no discharge. Left eye exhibits no discharge. No scleral icterus.  Neck: Normal range of motion. Neck supple. No JVD present. No tracheal deviation present. No thyromegaly present.  Cardiovascular: Normal rate, regular rhythm, normal heart sounds and intact distal pulses.  Exam reveals no gallop and no friction rub.   No murmur heard. Pulmonary/Chest: Effort normal and breath sounds normal. No stridor. No respiratory distress. He has no wheezes. He has no rales. He exhibits no tenderness.  Abdominal: Soft. Bowel sounds are normal. He exhibits no distension and no mass. There is no tenderness. There is no rebound and no guarding.  Musculoskeletal: Normal range of motion. He exhibits no edema and no tenderness.  Lymphadenopathy:    He has no cervical adenopathy.  Neurological: He is oriented to person, place, and time.  Skin: Skin is warm and dry. No rash noted. He is not diaphoretic. No erythema. No pallor.  Psychiatric: He has a normal mood and affect. His behavior is normal. Judgment and thought content normal.          Assessment & Plan:

## 2013-10-24 NOTE — Assessment & Plan Note (Signed)
His BP is well controlled 

## 2013-10-24 NOTE — Assessment & Plan Note (Signed)
He had an episode of A fib a few weeks ago that lasted a few hours but he has had good rate and rhythm control since then He is not willing to take an anticoagulant He sees Dr. [place] soon and will discuss this with him

## 2013-10-24 NOTE — Assessment & Plan Note (Signed)
He is having some sexual side effects and is doing really and wants to lower his celexa dose Will start a taper over the next 3 months

## 2013-10-25 ENCOUNTER — Encounter: Payer: Self-pay | Admitting: Internal Medicine

## 2013-10-25 LAB — HEPATITIS A ANTIBODY, TOTAL: Hep A Total Ab: REACTIVE — AB

## 2013-10-25 LAB — HEPATITIS B CORE ANTIBODY, TOTAL: Hep B Core Total Ab: REACTIVE — AB

## 2013-10-25 LAB — HEPATITIS C ANTIBODY: HCV Ab: NEGATIVE

## 2013-10-25 LAB — HIV ANTIBODY (ROUTINE TESTING W REFLEX): HIV 1&2 Ab, 4th Generation: NONREACTIVE

## 2013-10-25 LAB — HEPATITIS B SURFACE ANTIBODY,QUALITATIVE: Hep B S Ab: POSITIVE — AB

## 2013-11-09 ENCOUNTER — Other Ambulatory Visit: Payer: Self-pay | Admitting: Internal Medicine

## 2013-11-23 ENCOUNTER — Ambulatory Visit (INDEPENDENT_AMBULATORY_CARE_PROVIDER_SITE_OTHER): Payer: 59 | Admitting: Cardiology

## 2013-11-23 ENCOUNTER — Encounter: Payer: Self-pay | Admitting: Cardiology

## 2013-11-23 VITALS — Ht 68.0 in | Wt 137.1 lb

## 2013-11-23 DIAGNOSIS — I480 Paroxysmal atrial fibrillation: Secondary | ICD-10-CM

## 2013-11-23 DIAGNOSIS — I4891 Unspecified atrial fibrillation: Secondary | ICD-10-CM

## 2013-11-23 DIAGNOSIS — I10 Essential (primary) hypertension: Secondary | ICD-10-CM

## 2013-11-23 MED ORDER — APIXABAN 5 MG PO TABS: 5.0000 mg | ORAL_TABLET | Freq: Two times a day (BID) | ORAL | Status: DC

## 2013-11-23 NOTE — Progress Notes (Signed)
Roy Long Date of Birth: [birth year] Medical Record #[id number]  History of Present Illness: Roy Long is seen for follow up. He has PAF - maintained in sinus on Flecainide, HTN, hypothryoidism, GERD and prior alcohol abuse. He is active in [place]. No Etoh use since 2013 Reports he is doing well. He has an episode of Afib about every 6-12 months. May last 3-4 hours. Takes extra Flecainide and diltiazem and it resolves. BP well controlled.   Current Outpatient Prescriptions  Medication Sig Dispense Refill  . acyclovir (ZOVIRAX) 400 MG tablet TAKE 1 TABLET THREE TIMES A DAY  90 tablet  3  . amLODipine (NORVASC) 5 MG tablet TAKE 1 TABLET (5 MG TOTAL) BY MOUTH DAILY.  90 tablet  3  . citalopram (CELEXA) 10 MG tablet Take 1 tablet (10 mg total) by mouth daily.  30 tablet  2  . finasteride (PROSCAR) 5 MG tablet TAKE 1 TABLET BY MOUTH DAILY  90 tablet  3  . flecainide (TAMBOCOR) 50 MG tablet Take 1 tablet (50 mg total) by mouth 2 (two) times daily.  180 tablet  3  . levothyroxine (SYNTHROID, LEVOTHROID) 25 MCG tablet TAKE 1 TABLET BY MOUTH EVERY DAY BEFORE BREAKFAST  30 tablet  11  . Multiple Vitamins-Minerals (PRESERVISION/LUTEIN PO) Take by mouth as directed. 2 po daily      . omeprazole (PRILOSEC) 40 MG capsule Take 20 mg by mouth daily.       . Vitamins-Lipotropics (LIPOFLAVONOID PO) Take by mouth. 3 po daily       No current facility-administered medications for this visit.    No Known Allergies  Past Medical History  Diagnosis Date  . Atrial fibrillation   . Hypertension   . Alcohol dependence   . GERD (gastroesophageal reflux disease)   . History of kidney stones   . Cancer of skin, squamous cell     hand, chest  . Macular degeneration     Past Surgical History  Procedure Laterality Date  . Cervical fusion      History  Smoking status  . Former Smoker  . Quit date: 03/30/1983  Smokeless tobacco  . Never Used    History  Alcohol Use No    Family History    Problem Relation Age of Onset  . Heart failure Mother   . Heart attack Father   . Stroke Brother     X2  . Cancer Neg Hx   . Heart disease Neg Hx   . Hyperlipidemia Neg Hx   . Hypertension Neg Hx   . Diabetes Neg Hx     Review of Systems: The review of systems is per the HPI.  All other systems were reviewed and are negative.  Physical Exam: Ht 5\' 8"  (1.727 m)  Wt 137 lb 1.6 oz (62.188 kg)  BMI 20.85 kg/m2 Patient is very pleasant and in no acute distress. Skin is warm and dry. Color is normal.  HEENT is unremarkable. Normocephalic/atraumatic. PERRL. Sclera are nonicteric. Neck is supple. No masses. No JVD. Lungs are clear. Cardiac exam shows a regular rate and rhythm. Abdomen is soft. Extremities are without edema. Gait and ROM are intact. No gross neurologic deficits noted.  LABORATORY DATA:  Lab Results  Component Value Date   WBC 6.8 10/24/2013   HGB 14.6 10/24/2013   HCT 42.8 10/24/2013   PLT 263.0 10/24/2013   GLUCOSE 100* 10/24/2013   CHOL 179 08/20/2011   TRIG 98.0 08/20/2011   HDL 40.10 08/20/2011  LDLCALC 119* 08/20/2011   ALT 18 10/24/2013   AST 19 10/24/2013   NA 138 10/24/2013   K 3.8 10/24/2013   CL 104 10/24/2013   CREATININE 1.0 10/24/2013   BUN 12 10/24/2013   CO2 29 10/24/2013   TSH 2.30 10/24/2013   PSA 0.17 04/27/2011   INR 1.0 04/15/2011   HGBA1C 5.1 04/15/2011     Assessment / Plan: 1. HTN - BP well controlled.  2. PAF - one episode in the past 6 months. Continue Flecainide and diltiazem. Discussed anticoagulation. Now that he is 35 he has a CHAD-VAsc score of 2 with annual stroke risk of 3.5%. I have recommended starting anticoagulation. We discussed options and will start Eliquis 5 mg bid.   3. Alcohol abuse - he continues to abstain.   See him back in 6 months.

## 2013-12-05 ENCOUNTER — Telehealth: Payer: Self-pay | Admitting: Cardiology

## 2013-12-05 ENCOUNTER — Telehealth: Payer: Self-pay | Admitting: Internal Medicine

## 2013-12-05 NOTE — Telephone Encounter (Signed)
Yes, it is part of the withdrawl process

## 2013-12-05 NOTE — Telephone Encounter (Signed)
Returned call to patient he stated he needs a prior authorization for eliquis.

## 2013-12-05 NOTE — Telephone Encounter (Signed)
Pt is aware.  

## 2013-12-05 NOTE — Telephone Encounter (Signed)
Pt called stated that he discontinue Celexa and he is experiencing dizzy and light headed. Pt was wondering if this is normal. Please advise.

## 2013-12-05 NOTE — Telephone Encounter (Signed)
Patient states that he is having a problem with the prior authorization for his Eliquis.  Can we check on that and give him an update?

## 2013-12-07 NOTE — Telephone Encounter (Signed)
Patient's ID # [id number].

## 2013-12-07 NOTE — Telephone Encounter (Addendum)
Prior authorization for Eliquis faxed to patient's insurance company. Awaiting approval. Patient notified. 

## 2013-12-11 NOTE — Telephone Encounter (Signed)
Prior authorization for Eliquis has been approved through 12/07/2014. Pharmacy notified. Patient notified. PA # [id number]

## 2013-12-31 ENCOUNTER — Other Ambulatory Visit: Payer: Self-pay

## 2013-12-31 MED ORDER — FINASTERIDE 5 MG PO TABS: ORAL_TABLET | ORAL | Status: DC

## 2014-01-17 ENCOUNTER — Encounter: Payer: Self-pay | Admitting: Internal Medicine

## 2014-01-17 ENCOUNTER — Ambulatory Visit (INDEPENDENT_AMBULATORY_CARE_PROVIDER_SITE_OTHER): Payer: 59 | Admitting: Internal Medicine

## 2014-01-17 ENCOUNTER — Other Ambulatory Visit (INDEPENDENT_AMBULATORY_CARE_PROVIDER_SITE_OTHER): Payer: 59

## 2014-01-17 VITALS — BP 132/78 | HR 63 | Temp 97.8°F | Resp 16 | Ht 68.0 in | Wt 137.8 lb

## 2014-01-17 DIAGNOSIS — I10 Essential (primary) hypertension: Secondary | ICD-10-CM

## 2014-01-17 DIAGNOSIS — I480 Paroxysmal atrial fibrillation: Secondary | ICD-10-CM

## 2014-01-17 DIAGNOSIS — E038 Other specified hypothyroidism: Secondary | ICD-10-CM

## 2014-01-17 DIAGNOSIS — E785 Hyperlipidemia, unspecified: Secondary | ICD-10-CM

## 2014-01-17 DIAGNOSIS — N400 Enlarged prostate without lower urinary tract symptoms: Secondary | ICD-10-CM

## 2014-01-17 DIAGNOSIS — Z0000 Encounter for general adult medical examination without abnormal findings: Secondary | ICD-10-CM

## 2014-01-17 LAB — COMPREHENSIVE METABOLIC PANEL
ALBUMIN: 3.8 g/dL (ref 3.5–5.2)
ALK PHOS: 62 U/L (ref 39–117)
ALT: 37 U/L (ref 0–53)
AST: 29 U/L (ref 0–37)
BUN: 11 mg/dL (ref 6–23)
CALCIUM: 9.3 mg/dL (ref 8.4–10.5)
CHLORIDE: 105 meq/L (ref 96–112)
CO2: 22 mEq/L (ref 19–32)
Creatinine, Ser: 0.9 mg/dL (ref 0.4–1.5)
GFR: 87.63 mL/min (ref >60.00)
Glucose, Bld: 92 mg/dL (ref 70–99)
POTASSIUM: 4.4 meq/L (ref 3.5–5.1)
SODIUM: 140 meq/L (ref 135–145)
Total Bilirubin: 0.9 mg/dL (ref 0.2–1.2)
Total Protein: 7 g/dL (ref 6.0–8.3)

## 2014-01-17 LAB — CBC WITH DIFFERENTIAL/PLATELET
BASOS ABS: 0 10*3/uL (ref 0.0–0.1)
Basophils Relative: 0.6 % (ref 0.0–3.0)
EOS ABS: 0.3 10*3/uL (ref 0.0–0.7)
Eosinophils Relative: 3.8 % (ref 0.0–5.0)
HCT: 45.3 % (ref 39.0–52.0)
Hemoglobin: 15.2 g/dL (ref 13.0–17.0)
LYMPHS PCT: 42.8 % (ref 12.0–46.0)
Lymphs Abs: 3.1 10*3/uL (ref 0.7–4.0)
MCHC: 33.5 g/dL (ref 30.0–36.0)
MCV: 89.6 fl (ref 78.0–100.0)
MONO ABS: 0.6 10*3/uL (ref 0.1–1.0)
Monocytes Relative: 8.4 % (ref 3.0–12.0)
NEUTROS PCT: 44.4 % (ref 43.0–77.0)
Neutro Abs: 3.2 10*3/uL (ref 1.4–7.7)
Platelets: 267 10*3/uL (ref 150.0–400.0)
RBC: 5.06 Mil/uL (ref 4.22–5.81)
RDW: 12.9 % (ref 11.5–15.5)
WBC: 7.3 10*3/uL (ref 4.0–10.5)

## 2014-01-17 LAB — LIPID PANEL
CHOL/HDL RATIO: 6
Cholesterol: 216 mg/dL — ABNORMAL HIGH (ref 0–200)
HDL: 36 mg/dL — ABNORMAL LOW (ref >39.00)
LDL Cholesterol: 147 mg/dL — ABNORMAL HIGH (ref 0–99)
NONHDL: 180
Triglycerides: 165 mg/dL — ABNORMAL HIGH (ref 0.0–149.0)
VLDL: 33 mg/dL (ref 0.0–40.0)

## 2014-01-17 LAB — FECAL OCCULT BLOOD, GUAIAC: Fecal Occult Blood: NEGATIVE

## 2014-01-17 LAB — TSH: TSH: 3.69 u[IU]/mL (ref 0.35–4.50)

## 2014-01-17 LAB — PSA: PSA: 0.24 ng/mL (ref 0.10–4.00)

## 2014-01-17 MED ORDER — LEVOTHYROXINE SODIUM 25 MCG PO TABS: ORAL_TABLET | ORAL | Status: DC

## 2014-01-17 NOTE — Patient Instructions (Signed)
Health Maintenance A healthy lifestyle and preventative care can promote health and wellness.  Maintain regular health, dental, and eye exams.  Eat a healthy diet. Foods like vegetables, fruits, whole grains, low-fat dairy products, and lean protein foods contain the nutrients you need and are low in calories. Decrease your intake of foods high in solid fats, added sugars, and salt. Get information about a proper diet from your health care provider, if necessary.  Regular physical exercise is one of the most important things you can do for your health. Most adults should get at least 150 minutes of moderate-intensity exercise (any activity that increases your heart rate and causes you to sweat) each week. In addition, most adults need muscle-strengthening exercises on 2 or more days a week.   Maintain a healthy weight. The body mass index (BMI) is a screening tool to identify possible weight problems. It provides an estimate of body fat based on height and weight. Your health care provider can find your BMI and can help you achieve or maintain a healthy weight. For males 20 years and older:  A BMI below 18.5 is considered underweight.  A BMI of 18.5 to 24.9 is normal.  A BMI of 25 to 29.9 is considered overweight.  A BMI of 30 and above is considered obese.  Maintain normal blood lipids and cholesterol by exercising and minimizing your intake of saturated fat. Eat a balanced diet with plenty of fruits and vegetables. Blood tests for lipids and cholesterol should begin at age 20 and be repeated every 5 years. If your lipid or cholesterol levels are high, you are over age 50, or you are at high risk for heart disease, you may need your cholesterol levels checked more frequently.Ongoing high lipid and cholesterol levels should be treated with medicines if diet and exercise are not working.  If you smoke, find out from your health care provider how to quit. If you do not use tobacco, do not  start.  Lung cancer screening is recommended for adults aged 55-80 years who are at high risk for developing lung cancer because of a history of smoking. A yearly low-dose CT scan of the lungs is recommended for people who have at least a 30-pack-year history of smoking and are current smokers or have quit within the past 15 years. A pack year of smoking is smoking an average of 1 pack of cigarettes a day for 1 year (for example, a 30-pack-year history of smoking could mean smoking 1 pack a day for 30 years or 2 packs a day for 15 years). Yearly screening should continue until the smoker has stopped smoking for at least 15 years. Yearly screening should be stopped for people who develop a health problem that would prevent them from having lung cancer treatment.  If you choose to drink alcohol, do not have more than 2 drinks per day. One drink is considered to be 12 oz (360 mL) of beer, 5 oz (150 mL) of wine, or 1.5 oz (45 mL) of liquor.  Avoid the use of street drugs. Do not share needles with anyone. Ask for help if you need support or instructions about stopping the use of drugs.  High blood pressure causes heart disease and increases the risk of stroke. Blood pressure should be checked at least every 1-2 years. Ongoing high blood pressure should be treated with medicines if weight loss and exercise are not effective.  If you are 45-79 years old, ask your health care provider if   you should take aspirin to prevent heart disease.  Diabetes screening involves taking a blood sample to check your fasting blood sugar level. This should be done once every 3 years after age 45 if you are at a normal weight and without risk factors for diabetes. Testing should be considered at a younger age or be carried out more frequently if you are overweight and have at least 1 risk factor for diabetes.  Colorectal cancer can be detected and often prevented. Most routine colorectal cancer screening begins at the age of 50  and continues through age 75. However, your health care provider may recommend screening at an earlier age if you have risk factors for colon cancer. On a yearly basis, your health care provider may provide home test kits to check for hidden blood in the stool. A small camera at the end of a tube may be used to directly examine the colon (sigmoidoscopy or colonoscopy) to detect the earliest forms of colorectal cancer. Talk to your health care provider about this at age 50 when routine screening begins. A direct exam of the colon should be repeated every 5-10 years through age 75, unless early forms of precancerous polyps or small growths are found.  People who are at an increased risk for hepatitis B should be screened for this virus. You are considered at high risk for hepatitis B if:  You were born in a country where hepatitis B occurs often. Talk with your health care provider about which countries are considered high risk.  Your parents were born in a high-risk country and you have not received a shot to protect against hepatitis B (hepatitis B vaccine).  You have HIV or AIDS.  You use needles to inject street drugs.  You live with, or have sex with, someone who has hepatitis B.  You are a man who has sex with other men (MSM).  You get hemodialysis treatment.  You take certain medicines for conditions like cancer, organ transplantation, and autoimmune conditions.  Hepatitis C blood testing is recommended for all people born from 1945 through 1965 and any individual with known risk factors for hepatitis C.  Healthy men should no longer receive prostate-specific antigen (PSA) blood tests as part of routine cancer screening. Talk to your health care provider about prostate cancer screening.  Testicular cancer screening is not recommended for adolescents or adult males who have no symptoms. Screening includes self-exam, a health care provider exam, and other screening tests. Consult with your  health care provider about any symptoms you have or any concerns you have about testicular cancer.  Practice safe sex. Use condoms and avoid high-risk sexual practices to reduce the spread of sexually transmitted infections (STIs).  You should be screened for STIs, including gonorrhea and chlamydia if:  You are sexually active and are younger than 24 years.  You are older than 24 years, and your health care provider tells you that you are at risk for this type of infection.  Your sexual activity has changed since you were last screened, and you are at an increased risk for chlamydia or gonorrhea. Ask your health care provider if you are at risk.  If you are at risk of being infected with HIV, it is recommended that you take a prescription medicine daily to prevent HIV infection. This is called pre-exposure prophylaxis (PrEP). You are considered at risk if:  You are a man who has sex with other men (MSM).  You are a heterosexual man who   is sexually active with multiple partners.  You take drugs by injection.  You are sexually active with a partner who has HIV.  Talk with your health care provider about whether you are at high risk of being infected with HIV. If you choose to begin PrEP, you should first be tested for HIV. You should then be tested every 3 months for as long as you are taking PrEP.  Use sunscreen. Apply sunscreen liberally and repeatedly throughout the day. You should seek shade when your shadow is shorter than you. Protect yourself by wearing long sleeves, pants, a wide-brimmed hat, and sunglasses year round whenever you are outdoors.  Tell your health care provider of new moles or changes in moles, especially if there is a change in shape or color. Also, tell your health care provider if a mole is larger than the size of a pencil eraser.  A one-time screening for abdominal aortic aneurysm (AAA) and surgical repair of large AAAs by ultrasound is recommended for men aged  31-75 years who are current or former smokers.  Stay current with your vaccines (immunizations). Document Released: 09/11/2007 Document Revised: 03/20/2013 Document Reviewed: 08/10/2010 [facility] Patient Information 2015 [place], [place]. This information is not intended to replace advice given to you by your health care provider. Make sure you discuss any questions you have with your health care provider. Hypothyroidism The thyroid is a large gland located in the lower front of your neck. The thyroid gland helps control metabolism. Metabolism is how your body handles food. It controls metabolism with the hormone thyroxine. When this gland is underactive (hypothyroid), it produces too little hormone.  CAUSES These include:   Absence or destruction of thyroid tissue.  Goiter due to iodine deficiency.  Goiter due to medications.  Congenital defects (since birth).  Problems with the pituitary. This causes a lack of TSH (thyroid stimulating hormone). This hormone tells the thyroid to turn out more hormone. SYMPTOMS  Lethargy (feeling as though you have no energy)  Cold intolerance  Weight gain (in spite of normal food intake)  Dry skin  Coarse hair  Menstrual irregularity (if severe, may lead to infertility)  Slowing of thought processes Cardiac problems are also caused by insufficient amounts of thyroid hormone. Hypothyroidism in the newborn is cretinism, and is an extreme form. It is important that this form be treated adequately and immediately or it will lead rapidly to retarded physical and mental development. DIAGNOSIS  To prove hypothyroidism, your caregiver may do blood tests and ultrasound tests. Sometimes the signs are hidden. It may be necessary for your caregiver to watch this illness with blood tests either before or after diagnosis and treatment. TREATMENT  Low levels of thyroid hormone are increased by using synthetic thyroid hormone. This is a safe, effective  treatment. It usually takes about four weeks to gain the full effects of the medication. After you have the full effect of the medication, it will generally take another four weeks for problems to leave. Your caregiver may start you on low doses. If you have had heart problems the dose may be gradually increased. It is generally not an emergency to get rapidly to normal. HOME CARE INSTRUCTIONS   Take your medications as your caregiver suggests. Let your caregiver know of any medications you are taking or start taking. Your caregiver will help you with dosage schedules.  As your condition improves, your dosage needs may increase. It will be necessary to have continuing blood tests as suggested by your  caregiver.  Report all suspected medication side effects to your caregiver. SEEK MEDICAL CARE IF: Seek medical care if you develop:  Sweating.  Tremulousness (tremors).  Anxiety.  Rapid weight loss.  Heat intolerance.  Emotional swings.  Diarrhea.  Weakness. SEEK IMMEDIATE MEDICAL CARE IF:  You develop chest pain, an irregular heart beat (palpitations), or a rapid heart beat. MAKE SURE YOU:   Understand these instructions.  Will watch your condition.  Will get help right away if you are not doing well or get worse. Document Released: 03/15/2005 Document Revised: 06/07/2011 Document Reviewed: 11/03/2007 [facility] Patient Information 2015 [place], [place]. This information is not intended to replace advice given to you by your health care provider. Make sure you discuss any questions you have with your health care provider.

## 2014-01-17 NOTE — Progress Notes (Signed)
Subjective:    Patient ID: Roy Long, male    DOB: [birth year], 63 y.o.   MRN: [id number]  Thyroid Problem Presents for follow-up visit. Symptoms include weight gain. Patient reports no anxiety, cold intolerance, constipation, depressed mood, diaphoresis, diarrhea, dry skin, fatigue, hair loss, heat intolerance, hoarse voice, leg swelling, nail problem, palpitations, tremors, visual change or weight loss. The symptoms have been stable. Past treatments include levothyroxine. The treatment provided moderate relief. His past medical history is significant for atrial fibrillation. There is no history of dementia, diabetes, Graves' ophthalmopathy, heart failure, hyperlipidemia, neuropathy, obesity or osteopenia.      Review of Systems  Constitutional: Positive for weight gain. Negative for fever, chills, weight loss, diaphoresis, activity change, appetite change, fatigue and unexpected weight change.  HENT: Negative.  Negative for hoarse voice.   Eyes: Negative.   Respiratory: Negative.  Negative for cough, choking, chest tightness, shortness of breath and stridor.   Cardiovascular: Negative.  Negative for chest pain, palpitations and leg swelling.  Gastrointestinal: Negative.  Negative for nausea, vomiting, abdominal pain, diarrhea, constipation and blood in stool.  Endocrine: Negative.  Negative for cold intolerance and heat intolerance.  Genitourinary: Negative.   Musculoskeletal: Positive for arthralgias. Negative for back pain, gait problem, joint swelling, myalgias, neck pain and neck stiffness.  Skin: Negative.  Negative for rash.  Allergic/Immunologic: Negative.   Neurological: Negative.  Negative for tremors.  Hematological: Negative.  Negative for adenopathy. Does not bruise/bleed easily.  Psychiatric/Behavioral: Negative.        Objective:   Physical Exam  Vitals reviewed. Constitutional: He is oriented to person, place, and time. He appears well-developed and  well-nourished. No distress.  HENT:  Head: Normocephalic and atraumatic.  Mouth/Throat: Oropharynx is clear and moist. No oropharyngeal exudate.  Eyes: Conjunctivae are normal. Right eye exhibits no discharge. Left eye exhibits no discharge. No scleral icterus.  Neck: Normal range of motion. Neck supple. No JVD present. No tracheal deviation present. No thyromegaly present.  Cardiovascular: Normal rate, regular rhythm, normal heart sounds and intact distal pulses.  Exam reveals no gallop and no friction rub.   No murmur heard. Pulmonary/Chest: Effort normal and breath sounds normal. No stridor. No respiratory distress. He has no wheezes. He has no rales. He exhibits no tenderness.  Abdominal: Soft. Bowel sounds are normal. He exhibits no distension and no mass. There is no tenderness. There is no rebound and no guarding. Hernia confirmed negative in the right inguinal area and confirmed negative in the left inguinal area.  Genitourinary: Rectum normal, prostate normal, testes normal and penis normal. Rectal exam shows no external hemorrhoid, no internal hemorrhoid, no fissure, no mass and no tenderness. Guaiac negative stool. Prostate is not enlarged and not tender. Right testis shows no mass, no swelling and no tenderness. Right testis is descended. Left testis shows no mass, no swelling and no tenderness. Left testis is descended. Circumcised. No penile erythema or penile tenderness. No discharge found.  Musculoskeletal: Normal range of motion. He exhibits no edema and no tenderness.  Lymphadenopathy:    He has no cervical adenopathy.       Right: No inguinal adenopathy present.       Left: No inguinal adenopathy present.  Neurological: He is oriented to person, place, and time.  Skin: Skin is warm and dry. No rash noted. He is not diaphoretic. No erythema. No pallor.  Psychiatric: He has a normal mood and affect. His behavior is normal. Judgment and thought content normal.  Assessment & Plan:

## 2014-01-17 NOTE — Progress Notes (Signed)
Pre visit review using our clinic review tool, if applicable. No additional management support is needed unless otherwise documented below in the visit note. 

## 2014-01-18 ENCOUNTER — Telehealth: Payer: Self-pay | Admitting: Internal Medicine

## 2014-01-18 DIAGNOSIS — E785 Hyperlipidemia, unspecified: Secondary | ICD-10-CM | POA: Insufficient documentation

## 2014-01-18 NOTE — Assessment & Plan Note (Signed)
He has excellent rate and rhythm control

## 2014-01-18 NOTE — Assessment & Plan Note (Signed)
His BP is well controlled 

## 2014-01-18 NOTE — Assessment & Plan Note (Signed)
He will consider starting a statin

## 2014-01-18 NOTE — Assessment & Plan Note (Signed)

## 2014-01-18 NOTE — Telephone Encounter (Signed)
emmi emailed °

## 2014-01-18 NOTE — Assessment & Plan Note (Signed)
His TSH is on the normal range He will stay on the current dose 

## 2014-01-29 ENCOUNTER — Other Ambulatory Visit: Payer: Self-pay | Admitting: Nurse Practitioner

## 2014-04-26 ENCOUNTER — Other Ambulatory Visit: Payer: Self-pay | Admitting: *Deleted

## 2014-04-26 MED ORDER — FLECAINIDE ACETATE 50 MG PO TABS: 50.0000 mg | ORAL_TABLET | Freq: Two times a day (BID) | ORAL | Status: DC

## 2014-05-23 ENCOUNTER — Encounter: Payer: Self-pay | Admitting: Internal Medicine

## 2014-05-23 ENCOUNTER — Ambulatory Visit (INDEPENDENT_AMBULATORY_CARE_PROVIDER_SITE_OTHER)
Admission: RE | Admit: 2014-05-23 | Discharge: 2014-05-23 | Disposition: A | Discharge status: Home / Self Care | Payer: Medicare Other | Source: Ambulatory Visit | Attending: Internal Medicine | Admitting: Internal Medicine

## 2014-05-23 ENCOUNTER — Encounter: Payer: Self-pay | Admitting: Cardiology

## 2014-05-23 ENCOUNTER — Ambulatory Visit (INDEPENDENT_AMBULATORY_CARE_PROVIDER_SITE_OTHER): Payer: Medicare Other | Admitting: Internal Medicine

## 2014-05-23 ENCOUNTER — Ambulatory Visit (INDEPENDENT_AMBULATORY_CARE_PROVIDER_SITE_OTHER): Payer: Medicare Other | Admitting: Cardiology

## 2014-05-23 VITALS — BP 122/70 | HR 88 | Temp 98.3°F | Ht 68.0 in | Wt 131.0 lb

## 2014-05-23 VITALS — BP 108/64 | HR 69 | Ht 68.0 in | Wt 129.9 lb

## 2014-05-23 DIAGNOSIS — I10 Essential (primary) hypertension: Secondary | ICD-10-CM

## 2014-05-23 DIAGNOSIS — S8990XA Unspecified injury of unspecified lower leg, initial encounter: Secondary | ICD-10-CM | POA: Insufficient documentation

## 2014-05-23 DIAGNOSIS — S8992XA Unspecified injury of left lower leg, initial encounter: Secondary | ICD-10-CM

## 2014-05-23 DIAGNOSIS — I480 Paroxysmal atrial fibrillation: Secondary | ICD-10-CM

## 2014-05-23 DIAGNOSIS — E785 Hyperlipidemia, unspecified: Secondary | ICD-10-CM

## 2014-05-23 MED ORDER — DILTIAZEM HCL ER COATED BEADS 180 MG PO CP24: 180.0000 mg | ORAL_CAPSULE | Freq: Every day | ORAL | Status: DC

## 2014-05-23 MED ORDER — APIXABAN 5 MG PO TABS: 5.0000 mg | ORAL_TABLET | Freq: Two times a day (BID) | ORAL | Status: DC

## 2014-05-23 NOTE — Patient Instructions (Signed)

## 2014-05-23 NOTE — Progress Notes (Signed)
Rx given to patient.

## 2014-05-23 NOTE — Progress Notes (Signed)
Yetta Flock Date of Birth: 31-Mar-1950 Medical Record #[id number]  History of Present Illness: Mr. Shukla is seen for follow up of PAF. - he is on chronic Flecainide. He also has a history of  HTN, hypothryoidism, GERD and prior alcohol abuse. He is active in [place]. No Etoh use since 2013 Reports he is doing well. He had an episode of Afib around Xmas lasting about 4 hours.  Took an extra Flecainide and diltiazem and it resolved. BP well controlled.   Current Outpatient Prescriptions  Medication Sig Dispense Refill  . acyclovir (ZOVIRAX) 400 MG tablet TAKE 1 TABLET THREE TIMES A DAY 90 tablet 3  . amLODipine (NORVASC) 5 MG tablet TAKE 1 TABLET (5 MG TOTAL) BY MOUTH DAILY. 90 tablet 0  . apixaban (ELIQUIS) 5 MG TABS tablet Take 1 tablet (5 mg total) by mouth 2 (two) times daily. 180 tablet 3  . finasteride (PROSCAR) 5 MG tablet TAKE 1 TABLET BY MOUTH DAILY 90 tablet 3  . flecainide (TAMBOCOR) 50 MG tablet Take 1 tablet (50 mg total) by mouth 2 (two) times daily. 180 tablet 0  . levothyroxine (SYNTHROID, LEVOTHROID) 25 MCG tablet TAKE 1 TABLET BY MOUTH EVERY DAY BEFORE BREAKFAST 90 tablet 1  . Multiple Vitamins-Minerals (PRESERVISION AREDS PO) Take 40 mg by mouth 1 day or 1 dose.    Marland Kitchen omeprazole (PRILOSEC) 40 MG capsule Take 20 mg by mouth daily.     . Vitamins-Lipotropics (LIPOFLAVONOID PO) Take by mouth. 3 po daily    . diltiazem (CARDIZEM CD) 180 MG 24 hr capsule Take 1 capsule (180 mg total) by mouth daily. 30 capsule 3   No current facility-administered medications for this visit.    No Known Allergies  Past Medical History  Diagnosis Date  . Atrial fibrillation   . Hypertension   . Alcohol dependence   . GERD (gastroesophageal reflux disease)   . History of kidney stones   . Cancer of skin, squamous cell     hand, chest  . Macular degeneration   . Arthritis     Past Surgical History  Procedure Laterality Date  . Cervical fusion      History  Smoking status  . Former  Smoker  . Quit date: 03/30/1983  Smokeless tobacco  . Never Used    History  Alcohol Use No    Family History  Problem Relation Age of Onset  . Heart failure Mother   . Heart attack Father   . Stroke Brother     X2  . Cancer Neg Hx   . Heart disease Neg Hx   . Hyperlipidemia Neg Hx   . Hypertension Neg Hx   . Diabetes Neg Hx     Review of Systems: The review of systems is per the HPI.  He is seeing ortho for a possible torn meniscus. All other systems were reviewed and are negative.  Physical Exam: BP 108/64 mmHg  Pulse 69  Ht 5\' 8"  (1.727 m)  Wt 129 lb 14.4 oz (58.922 kg)  BMI 19.76 kg/m2 Patient is very pleasant and in no acute distress. Skin is warm and dry. Color is normal.  HEENT is unremarkable. Normocephalic/atraumatic. PERRL. Sclera are nonicteric. Neck is supple. No masses. No JVD. Lungs are clear. Cardiac exam shows a regular rate and rhythm. Abdomen is soft. Extremities are without edema. Gait and ROM are intact. No gross neurologic deficits noted.  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 No acute change. I have personally reviewed and interpreted this study.   Assessment / Plan: 1. HTN - BP well controlled.  2. PAF - one sustained episode in the past 6 months. Continue Flecainide and prn diltiazem. Continue Eliquis 5 mg bid for anticoagulation. If he needs knee surgery he will just need to hold 48 hours prior to  surgery.  3. Alcohol abuse - he continues to abstain.   Follow up in 6 months.

## 2014-05-23 NOTE — Patient Instructions (Signed)
Continue your current therapy  I will see you in 6 months.   

## 2014-05-23 NOTE — Progress Notes (Signed)
Pre visit review using our clinic review tool, if applicable. No additional management support is needed unless otherwise documented below in the visit note. 

## 2014-05-26 NOTE — Progress Notes (Signed)
   Subjective:    Patient ID: Roy Long, male    DOB: Dec 19, 1950, 64 y.o.   MRN: [id number]  HPI Comments: He had a left knee injury about 3 months ago now complains of recurrent left medial knee pain.  Knee Pain  The incident occurred at home. The injury mechanism was a twisting injury. The pain is present in the left knee. The pain is at a severity of 2/10. The pain is mild. The pain has been intermittent since onset. Pertinent negatives include no inability to bear weight, loss of motion, loss of sensation, muscle weakness, numbness or tingling. He reports no foreign bodies present. The symptoms are aggravated by movement and palpation. He has tried acetaminophen for the symptoms. The treatment provided moderate relief.      Review of Systems  Neurological: Negative for tingling and numbness.  All other systems reviewed and are negative.      Objective:   Physical Exam  Musculoskeletal:       Left knee: He exhibits normal range of motion, no swelling, no effusion, no ecchymosis, no deformity, no laceration, no erythema, normal alignment, no LCL laxity, normal patellar mobility, no bony tenderness, normal meniscus and no MCL laxity. Tenderness found. Medial joint line tenderness noted. No lateral joint line, no MCL, no LCL and no patellar tendon tenderness noted.          Assessment & Plan:

## 2014-05-26 NOTE — Assessment & Plan Note (Signed)
Exam shows some concern over the medial meniscus area, plain films show mild DJD, I am concerned that he may have damaged medial meniscus or MCL - will refer to ortho at this request

## 2014-05-30 ENCOUNTER — Other Ambulatory Visit: Payer: Self-pay | Admitting: Internal Medicine

## 2014-07-03 ENCOUNTER — Other Ambulatory Visit: Payer: Self-pay

## 2014-07-03 MED ORDER — AMLODIPINE BESYLATE 5 MG PO TABS: ORAL_TABLET | ORAL | Status: DC

## 2014-07-10 ENCOUNTER — Other Ambulatory Visit: Payer: Self-pay | Admitting: Orthopedic Surgery

## 2014-07-10 DIAGNOSIS — M25562 Pain in left knee: Secondary | ICD-10-CM

## 2014-07-19 ENCOUNTER — Other Ambulatory Visit: Payer: Self-pay | Admitting: Internal Medicine

## 2014-07-19 NOTE — Telephone Encounter (Signed)
Pt need follow up.

## 2014-07-20 ENCOUNTER — Ambulatory Visit
Admission: RE | Admit: 2014-07-20 | Discharge: 2014-07-20 | Disposition: A | Discharge status: Home / Self Care | Payer: Medicare Other | Source: Ambulatory Visit | Attending: Orthopedic Surgery | Admitting: Orthopedic Surgery

## 2014-07-20 DIAGNOSIS — M25562 Pain in left knee: Secondary | ICD-10-CM

## 2014-07-24 ENCOUNTER — Other Ambulatory Visit: Payer: Self-pay

## 2014-07-24 MED ORDER — FLECAINIDE ACETATE 50 MG PO TABS: 50.0000 mg | ORAL_TABLET | Freq: Two times a day (BID) | ORAL | Status: DC

## 2014-08-21 ENCOUNTER — Telehealth: Payer: Self-pay | Admitting: Internal Medicine

## 2014-08-21 NOTE — Telephone Encounter (Signed)
Patient Name: Roy Long  DOB: 1951-01-10    Initial Comment Caller states he just pulled tick off of arm, has questions.   Nurse Assessment  Nurse: Mallie Mussel, RN, Alveta Heimlich Date/Time Eilene Ghazi Time): 08/21/2014 10:14:41 AM  Confirm and document reason for call. If symptomatic, describe symptoms. ---Caller states that he pulled a tick off of his arm. There is a red spot on his arm where the tick was located. He was able to remove the entire tick. Denies fever and rashes.  Has the patient traveled out of the country within the last 30 days? ---No  Does the patient require triage? ---Yes  Related visit to physician within the last 2 weeks? ---No  Does the PT have any chronic conditions? (i.e. diabetes, asthma, etc.) ---Yes  List chronic conditions. ---HTN, AFib     Guidelines    Guideline Title Affirmed Question Affirmed Notes  Tick Bite Tick bite with no complications (all triage questions negative)    Final Disposition User   [place], RN, Alveta Heimlich

## 2014-08-28 ENCOUNTER — Telehealth: Payer: Self-pay | Admitting: *Deleted

## 2014-08-28 NOTE — Telephone Encounter (Signed)
[facility] Patient Name: Roy Long Gender: Male DOB: 1950/06/03 Age: 63 Y 15 D Return Phone Number: [phone] (Primary) Address: City/State/Zip: [place] Client [facility] - Client [facility] - Day Physician Jones, [place] Type Call Call Type Triage / Clinical Relationship To Patient Self Appointment Disposition EMR Appointment Not Necessary Info pasted into Epic Yes Return Phone Number [phone] (Primary) Chief Complaint Tick Bite Initial Comment Caller states he just pulled tick off of arm, has questions. PreDisposition Call Doctor Nurse Assessment Nurse: Mallie Mussel, RN, Alveta Heimlich Date/Time Eilene Ghazi Time): 08/21/2014 10:14:41 AM Confirm and document reason for call. If symptomatic, describe symptoms. ---Caller states that he pulled a tick off of his arm. There is a red spot on his arm where the tick was located. He was able to remove the entire tick. Denies fever and rashes. Has the patient traveled out of the country within the last 30 days? ---No Does the patient require triage? ---Yes Related visit to physician within the last 2 weeks? ---No Does the PT have any chronic conditions? (i.e. diabetes, asthma, etc.) ---Yes List chronic conditions. ---HTN, AFib Guidelines Guideline Title Affirmed Question Affirmed Notes Nurse Date/Time Eilene Ghazi Time) Tick Bite Tick bite with no complications (all triage questions negative) Mallie Mussel, RN, Alveta Heimlich 08/21/2014 10:16:54 AM Disp. Time Eilene Ghazi Time) Disposition Final User 08/21/2014 10:20:25 Soper, RN, Ola Spurr Understands: Yes Disagree/Comply: Comply PLEASE NOTE: All timestamps contained within this report are represented as [place] Standard Time. CONFIDENTIALTY NOTICE: This fax transmission is intended only for the addressee. It contains information that is legally privileged,  confidential or otherwise protected from use or disclosure. If you are not the intended recipient, you are strictly prohibited from reviewing, disclosing, copying using or disseminating any of this information or taking any action in reliance on or regarding this information. If you have received this fax in error, please notify us immediately by telephone so that we can arrange for its return to [place]. Phone: [phone], Toll-Free: [phone], Fax: [phone] Page: 2 of 2 Call Id: [id number] Care Advice Given Per Guideline HOME CARE: You should be able to treat this at home. REASSURANCE: Most tick bites are harmless and can be treated at home. The spread of disease by ticks is rare. ANTIBIOTIC OINTMENT: Wash the wound and your hands with soap and water after removal to prevent catching any tick disease. Apply antibiotic ointment (OTC) to the bite once. EXPECTED COURSE: Tick bites normally don't itch or hurt. That's why they often go unnoticed. TETANUS BOOSTER: If last tetanus shot was given over 10 years ago, needs a booster. Call PCP during regular office hours (within 3 days) * Fever or rash occur in the next 2 weeks * Bite begins to look infected * You become worse. After Care Instructions Given Call Event Type User Date / Time Description

## 2014-09-03 ENCOUNTER — Other Ambulatory Visit: Payer: Self-pay

## 2014-09-03 MED ORDER — LEVOTHYROXINE SODIUM 25 MCG PO TABS: ORAL_TABLET | ORAL | Status: DC

## 2014-09-11 ENCOUNTER — Telehealth: Payer: Self-pay | Admitting: Internal Medicine

## 2014-09-11 MED ORDER — LEVOTHYROXINE SODIUM 25 MCG PO TABS: ORAL_TABLET | ORAL | Status: DC

## 2014-09-11 NOTE — Telephone Encounter (Signed)
Done

## 2014-09-11 NOTE — Telephone Encounter (Signed)
Pt called in and said that he did not pick up script for his levothyroxine (SYNTHROID, LEVOTHROID) 25 MCG tablet [[id number]]  That was sent on 6/7.  He asked if it could be resubmitted for a 90 day supply?     Best number [phone]

## 2014-09-20 ENCOUNTER — Ambulatory Visit (INDEPENDENT_AMBULATORY_CARE_PROVIDER_SITE_OTHER): Payer: Medicare Other | Admitting: Ophthalmology

## 2014-10-07 ENCOUNTER — Other Ambulatory Visit: Payer: Self-pay | Admitting: Cardiology

## 2014-10-07 MED ORDER — AMLODIPINE BESYLATE 5 MG PO TABS: ORAL_TABLET | ORAL | Status: DC

## 2014-10-25 ENCOUNTER — Encounter: Payer: Self-pay | Admitting: Internal Medicine

## 2014-10-25 ENCOUNTER — Ambulatory Visit (INDEPENDENT_AMBULATORY_CARE_PROVIDER_SITE_OTHER): Payer: Medicare Other | Admitting: Ophthalmology

## 2014-10-25 DIAGNOSIS — I10 Essential (primary) hypertension: Secondary | ICD-10-CM

## 2014-10-25 DIAGNOSIS — H43813 Vitreous degeneration, bilateral: Secondary | ICD-10-CM | POA: Diagnosis not present

## 2014-10-25 DIAGNOSIS — H3531 Nonexudative age-related macular degeneration: Secondary | ICD-10-CM | POA: Diagnosis not present

## 2014-10-25 DIAGNOSIS — H35033 Hypertensive retinopathy, bilateral: Secondary | ICD-10-CM | POA: Diagnosis not present

## 2014-11-04 ENCOUNTER — Other Ambulatory Visit: Payer: Self-pay | Admitting: Internal Medicine

## 2014-11-04 MED ORDER — SILDENAFIL CITRATE 100 MG PO TABS: 50.0000 mg | ORAL_TABLET | Freq: Every day | ORAL | Status: DC | PRN

## 2014-11-04 NOTE — Telephone Encounter (Signed)
Patient has not received a response to this email.  He would like a call back as soon as possible at [phone].

## 2014-11-04 NOTE — Telephone Encounter (Signed)
Per patient, "I would like to get a prescription for Viagra. Can this be sent to my pharmacy  ([facility] [address] or will it be necessary to make an appointment with Dr. Ronnald Ramp?"

## 2014-12-05 ENCOUNTER — Encounter: Payer: Self-pay | Admitting: Cardiology

## 2014-12-05 ENCOUNTER — Ambulatory Visit (INDEPENDENT_AMBULATORY_CARE_PROVIDER_SITE_OTHER): Payer: Medicare Other | Admitting: Cardiology

## 2014-12-05 ENCOUNTER — Encounter: Payer: Self-pay | Admitting: Internal Medicine

## 2014-12-05 VITALS — BP 136/72 | HR 64 | Ht 68.0 in | Wt 127.9 lb

## 2014-12-05 DIAGNOSIS — I480 Paroxysmal atrial fibrillation: Secondary | ICD-10-CM

## 2014-12-05 DIAGNOSIS — I10 Essential (primary) hypertension: Secondary | ICD-10-CM | POA: Diagnosis not present

## 2014-12-05 DIAGNOSIS — E785 Hyperlipidemia, unspecified: Secondary | ICD-10-CM | POA: Diagnosis not present

## 2014-12-05 NOTE — Patient Instructions (Addendum)
We will schedule you for a nuclear stress test and an Echocardiogram  Continue your current therapy

## 2014-12-05 NOTE — Progress Notes (Signed)
Roy Long Date of Birth: 1950/08/11 Medical Record #[id number]  History of Present Illness: Roy Long is seen for follow up of PAF. - he is on chronic Flecainide. He also has a history of  HTN, hypothryoidism, GERD and prior alcohol abuse. He is active in [place]. No Etoh use since 2013 Since his last visit he reports 4 episodes of AFib. This is more than usual. Usually occurs in the evening and may last several hours.  Takes an extra Flecainide and diltiazem and it resolves. BP well controlled. He also reports symptoms of dyspnea on exertion. No chest pain. Last Echo was in 2003. Remote history of stress test but we have no records of this.   Current Outpatient Prescriptions  Medication Sig Dispense Refill  . acyclovir (ZOVIRAX) 400 MG tablet TAKE 1 TABLET THREE TIMES A DAY 90 tablet 3  . amLODipine (NORVASC) 5 MG tablet TAKE 1 TABLET (5 MG TOTAL) BY MOUTH DAILY. 90 tablet 1  . apixaban (ELIQUIS) 5 MG TABS tablet Take 1 tablet (5 mg total) by mouth 2 (two) times daily. 180 tablet 3  . diltiazem (CARDIZEM CD) 180 MG 24 hr capsule Take 1 capsule (180 mg total) by mouth daily. (Patient taking differently: Take 180 mg by mouth as needed. ) 30 capsule 3  . finasteride (PROSCAR) 5 MG tablet TAKE 1 TABLET BY MOUTH DAILY 90 tablet 3  . flecainide (TAMBOCOR) 50 MG tablet Take 1 tablet (50 mg total) by mouth 2 (two) times daily. 180 tablet 3  . levothyroxine (SYNTHROID, LEVOTHROID) 25 MCG tablet TAKE 1 TABLET BY MOUTH EVERY DAY BEFORE BREAKFAST 90 tablet 0  . Multiple Vitamins-Minerals (PRESERVISION AREDS PO) Take 40 mg by mouth 1 day or 1 dose.    Marland Kitchen omeprazole (PRILOSEC) 40 MG capsule Take 20 mg by mouth daily.     . sildenafil (VIAGRA) 100 MG tablet Take 0.5-1 tablets (50-100 mg total) by mouth daily as needed for erectile dysfunction. 10 tablet 11  . Vitamins-Lipotropics (LIPOFLAVONOID PO) Take by mouth. 3 po daily     No current facility-administered medications for this visit.    No Known  Allergies  Past Medical History  Diagnosis Date  . Atrial fibrillation   . Hypertension   . Alcohol dependence   . GERD (gastroesophageal reflux disease)   . History of kidney stones   . Cancer of skin, squamous cell     hand, chest  . Macular degeneration   . Arthritis   . Hypercholesterolemia     Past Surgical History  Procedure Laterality Date  . Cervical fusion      History  Smoking status  . Former Smoker  . Quit date: 03/30/1983  Smokeless tobacco  . Never Used    History  Alcohol Use No    Family History  Problem Relation Age of Onset  . Heart failure Mother   . Heart attack Father   . Stroke Brother     X2  . Cancer Neg Hx   . Heart disease Neg Hx   . Hyperlipidemia Neg Hx   . Hypertension Neg Hx   . Diabetes Neg Hx     Review of Systems: The review of systems is per the HPI.  All other systems were reviewed and are negative.  Physical Exam: BP 136/72 mmHg  Pulse 64  Ht 5\' 8"  (1.727 m)  Wt 58.015 kg (127 lb 14.4 oz)  BMI 19.45 kg/m2 Patient is very pleasant and in no acute distress. Skin  is warm and dry. Color is normal.  HEENT is unremarkable. Normocephalic/atraumatic. PERRL. Sclera are nonicteric. Neck is supple. No masses. No JVD. Lungs are clear. Cardiac exam shows a regular rate and rhythm. Abdomen is soft. Extremities are without edema. Gait and ROM are intact. No gross neurologic deficits noted.  LABORATORY DATA:  Lab Results  Component Value Date   WBC 7.3 01/17/2014   HGB 15.2 01/17/2014   HCT 45.3 01/17/2014   PLT 267.0 01/17/2014   GLUCOSE 92 01/17/2014   CHOL 216* 01/17/2014   TRIG 165.0* 01/17/2014   HDL 36.00* 01/17/2014   LDLCALC 147* 01/17/2014   ALT 37 01/17/2014   AST 29 01/17/2014   NA 140 01/17/2014   K 4.4 01/17/2014   CL 105 01/17/2014   CREATININE 0.9 01/17/2014   BUN 11 01/17/2014   CO2 22 01/17/2014   TSH 3.69 01/17/2014   PSA 0.24 01/17/2014   INR 1.0 04/15/2011   HGBA1C 5.1 04/15/2011     Assessment  / Plan: 1. HTN - BP well controlled.  2. PAF - some increased frequency.  Continue Flecainide and prn diltiazem. Continue Eliquis 5 mg bid for anticoagulation. Apparently he had bradycardia on higher doses of meds in past. Will update Echo to see if there is a reason he is having more episodes. He has done very well on Flecainide for 11 years some I do not want to change therapy at this point unless symptoms become much worse.    3. Alcohol abuse - he continues to abstain.   4. Dyspnea on exertion. Will schedule for stress Myoview.

## 2014-12-09 ENCOUNTER — Other Ambulatory Visit: Payer: Self-pay

## 2014-12-09 MED ORDER — LEVOTHYROXINE SODIUM 25 MCG PO TABS: ORAL_TABLET | ORAL | Status: DC

## 2014-12-10 ENCOUNTER — Telehealth (HOSPITAL_COMMUNITY): Payer: Self-pay

## 2014-12-10 NOTE — Telephone Encounter (Signed)
Patient given detailed instructions per Myocardial Perfusion Study Information Sheet for test on 12-12-2014 at 0815. Patient notified to arrive 15 minutes early and that it is imperative to arrive on time for appointment to keep from having the test rescheduled.  If you need to cancel or reschedule your appointment, please call the office within 24 hours of your appointment. Failure to do so may result in a cancellation of your appointment, and a $50 no show fee. Patient verbalized understanding. Oletta Lamas, Jadrien Narine A

## 2014-12-12 ENCOUNTER — Other Ambulatory Visit: Payer: Self-pay

## 2014-12-12 ENCOUNTER — Ambulatory Visit (HOSPITAL_BASED_OUTPATIENT_CLINIC_OR_DEPARTMENT_OTHER): Payer: Medicare Other

## 2014-12-12 ENCOUNTER — Ambulatory Visit (HOSPITAL_COMMUNITY): Payer: Medicare Other | Attending: Cardiology

## 2014-12-12 DIAGNOSIS — E785 Hyperlipidemia, unspecified: Secondary | ICD-10-CM

## 2014-12-12 DIAGNOSIS — I480 Paroxysmal atrial fibrillation: Secondary | ICD-10-CM | POA: Diagnosis not present

## 2014-12-12 DIAGNOSIS — I10 Essential (primary) hypertension: Secondary | ICD-10-CM

## 2014-12-12 DIAGNOSIS — I4891 Unspecified atrial fibrillation: Secondary | ICD-10-CM | POA: Diagnosis present

## 2014-12-12 LAB — MYOCARDIAL PERFUSION IMAGING
CSEPED: 12 min
CSEPEW: 13.4 METS
CSEPHR: 93 %
CSEPPHR: 144 {beats}/min
Exercise duration (sec): 0 s
LHR: 0.23
LV dias vol: 73 mL
LVSYSVOL: 26 mL
MPHR: 154 {beats}/min
Rest HR: 60 {beats}/min
SDS: 1
SRS: 4
SSS: 5
TID: 0.87

## 2014-12-12 MED ORDER — TECHNETIUM TC 99M SESTAMIBI GENERIC - CARDIOLITE
10.7000 | Freq: Once | INTRAVENOUS | Status: AC | PRN
  Administered 2014-12-12: 11 via INTRAVENOUS

## 2014-12-12 MED ORDER — TECHNETIUM TC 99M SESTAMIBI GENERIC - CARDIOLITE
32.6000 | Freq: Once | INTRAVENOUS | Status: AC | PRN
  Administered 2014-12-12: 33 via INTRAVENOUS

## 2015-01-29 ENCOUNTER — Other Ambulatory Visit: Payer: Self-pay | Admitting: Internal Medicine

## 2015-01-29 ENCOUNTER — Other Ambulatory Visit: Payer: Self-pay

## 2015-01-29 MED ORDER — LEVOTHYROXINE SODIUM 25 MCG PO TABS: ORAL_TABLET | ORAL | Status: DC

## 2015-02-18 ENCOUNTER — Encounter: Payer: Medicare Other | Admitting: Internal Medicine

## 2015-02-26 ENCOUNTER — Encounter: Payer: Self-pay | Admitting: Internal Medicine

## 2015-02-26 ENCOUNTER — Other Ambulatory Visit (INDEPENDENT_AMBULATORY_CARE_PROVIDER_SITE_OTHER): Payer: Medicare Other

## 2015-02-26 ENCOUNTER — Ambulatory Visit (INDEPENDENT_AMBULATORY_CARE_PROVIDER_SITE_OTHER): Payer: Medicare Other | Admitting: Internal Medicine

## 2015-02-26 VITALS — BP 124/76 | HR 67 | Temp 98.0°F | Resp 16 | Ht 68.0 in | Wt 128.0 lb

## 2015-02-26 DIAGNOSIS — I10 Essential (primary) hypertension: Secondary | ICD-10-CM

## 2015-02-26 DIAGNOSIS — E785 Hyperlipidemia, unspecified: Secondary | ICD-10-CM

## 2015-02-26 DIAGNOSIS — Z0000 Encounter for general adult medical examination without abnormal findings: Secondary | ICD-10-CM

## 2015-02-26 DIAGNOSIS — N400 Enlarged prostate without lower urinary tract symptoms: Secondary | ICD-10-CM | POA: Diagnosis not present

## 2015-02-26 DIAGNOSIS — R300 Dysuria: Secondary | ICD-10-CM

## 2015-02-26 DIAGNOSIS — N410 Acute prostatitis: Secondary | ICD-10-CM | POA: Insufficient documentation

## 2015-02-26 DIAGNOSIS — E038 Other specified hypothyroidism: Secondary | ICD-10-CM

## 2015-02-26 LAB — CBC WITH DIFFERENTIAL/PLATELET
BASOS ABS: 0 10*3/uL (ref 0.0–0.1)
Basophils Relative: 0.6 % (ref 0.0–3.0)
Eosinophils Absolute: 0.2 10*3/uL (ref 0.0–0.7)
Eosinophils Relative: 3.1 % (ref 0.0–5.0)
HEMATOCRIT: 44 % (ref 39.0–52.0)
Hemoglobin: 14.9 g/dL (ref 13.0–17.0)
LYMPHS ABS: 3 10*3/uL (ref 0.7–4.0)
Lymphocytes Relative: 44 % (ref 12.0–46.0)
MCHC: 33.9 g/dL (ref 30.0–36.0)
MCV: 90.2 fl (ref 78.0–100.0)
MONO ABS: 0.5 10*3/uL (ref 0.1–1.0)
MONOS PCT: 8 % (ref 3.0–12.0)
NEUTROS ABS: 3 10*3/uL (ref 1.4–7.7)
NEUTROS PCT: 44.3 % (ref 43.0–77.0)
PLATELETS: 262 10*3/uL (ref 150.0–400.0)
RBC: 4.88 Mil/uL (ref 4.22–5.81)
RDW: 12.5 % (ref 11.5–15.5)
WBC: 6.8 10*3/uL (ref 4.0–10.5)

## 2015-02-26 LAB — URINALYSIS, ROUTINE W REFLEX MICROSCOPIC
BILIRUBIN URINE: NEGATIVE
KETONES UR: NEGATIVE
Leukocytes, UA: NEGATIVE
Nitrite: NEGATIVE
SPECIFIC GRAVITY, URINE: 1.015 (ref 1.000–1.030)
Total Protein, Urine: NEGATIVE
UROBILINOGEN UA: 0.2 (ref 0.0–1.0)
Urine Glucose: NEGATIVE
pH: 7 (ref 5.0–8.0)

## 2015-02-26 LAB — COMPREHENSIVE METABOLIC PANEL
ALK PHOS: 62 U/L (ref 39–117)
ALT: 13 U/L (ref 0–53)
AST: 13 U/L (ref 0–37)
Albumin: 4.2 g/dL (ref 3.5–5.2)
BILIRUBIN TOTAL: 0.5 mg/dL (ref 0.2–1.2)
BUN: 14 mg/dL (ref 6–23)
CO2: 31 mEq/L (ref 19–32)
CREATININE: 0.88 mg/dL (ref 0.40–1.50)
Calcium: 9.3 mg/dL (ref 8.4–10.5)
Chloride: 106 mEq/L (ref 96–112)
GFR: 91.93 mL/min (ref >60.00)
GLUCOSE: 96 mg/dL (ref 70–99)
Potassium: 4.5 mEq/L (ref 3.5–5.1)
Sodium: 142 mEq/L (ref 135–145)
TOTAL PROTEIN: 6.6 g/dL (ref 6.0–8.3)

## 2015-02-26 LAB — LIPID PANEL
CHOLESTEROL: 193 mg/dL (ref 0–200)
HDL: 36.3 mg/dL — ABNORMAL LOW (ref >39.00)
LDL Cholesterol: 133 mg/dL — ABNORMAL HIGH (ref 0–99)
NONHDL: 156.75
Total CHOL/HDL Ratio: 5
Triglycerides: 117 mg/dL (ref 0.0–149.0)
VLDL: 23.4 mg/dL (ref 0.0–40.0)

## 2015-02-26 LAB — TSH: TSH: 4.08 u[IU]/mL (ref 0.35–4.50)

## 2015-02-26 LAB — PSA: PSA: 0.17 ng/mL (ref 0.10–4.00)

## 2015-02-26 MED ORDER — CIPROFLOXACIN HCL 500 MG PO TABS: 500.0000 mg | ORAL_TABLET | Freq: Two times a day (BID) | ORAL | Status: DC

## 2015-02-26 MED ORDER — LEVOTHYROXINE SODIUM 25 MCG PO TABS: ORAL_TABLET | ORAL | Status: DC

## 2015-02-26 MED ORDER — TAMSULOSIN HCL 0.4 MG PO CAPS: 0.4000 mg | ORAL_CAPSULE | Freq: Every day | ORAL | Status: DC

## 2015-02-26 NOTE — Progress Notes (Signed)
Pre visit review using our clinic review tool, if applicable. No additional management support is needed unless otherwise documented below in the visit note. 

## 2015-02-26 NOTE — Patient Instructions (Signed)

## 2015-02-26 NOTE — Progress Notes (Signed)
Subjective:  Patient ID: Roy Long, male    DOB: 01-25-1951  Age: 64 y.o. MRN: [id number]  CC: Annual Exam; Hypothyroidism; Urinary Tract Infection; and Hyperlipidemia   HPI MICHAH SCHOU presents for a complete physical but he also has complaints. He complains of a several week history of dysuria, hesitancy, frequency, and weak urine stream. He offers no other complaints  Outpatient Prescriptions Prior to Visit  Medication Sig Dispense Refill  . acyclovir (ZOVIRAX) 400 MG tablet TAKE 1 TABLET THREE TIMES A DAY 90 tablet 3  . amLODipine (NORVASC) 5 MG tablet TAKE 1 TABLET (5 MG TOTAL) BY MOUTH DAILY. 90 tablet 1  . apixaban (ELIQUIS) 5 MG TABS tablet Take 1 tablet (5 mg total) by mouth 2 (two) times daily. 180 tablet 3  . diltiazem (CARDIZEM CD) 180 MG 24 hr capsule Take 1 capsule (180 mg total) by mouth daily. (Patient taking differently: Take 180 mg by mouth as needed. ) 30 capsule 3  . finasteride (PROSCAR) 5 MG tablet TAKE 1 TABLET BY MOUTH DAILY 90 tablet 3  . flecainide (TAMBOCOR) 50 MG tablet Take 1 tablet (50 mg total) by mouth 2 (two) times daily. 180 tablet 3  . Multiple Vitamins-Minerals (PRESERVISION AREDS PO) Take 40 mg by mouth 1 day or 1 dose.    Marland Kitchen omeprazole (PRILOSEC) 40 MG capsule Take 20 mg by mouth daily.     . sildenafil (VIAGRA) 100 MG tablet Take 0.5-1 tablets (50-100 mg total) by mouth daily as needed for erectile dysfunction. 10 tablet 11  . Vitamins-Lipotropics (LIPOFLAVONOID PO) Take by mouth. 3 po daily    . levothyroxine (SYNTHROID, LEVOTHROID) 25 MCG tablet TAKE 1 TABLET BY MOUTH EVERY DAY BEFORE BREAKFAST 30 tablet 0   No facility-administered medications prior to visit.    ROS Review of Systems  Constitutional: Negative.  Negative for fever, chills, diaphoresis, appetite change and fatigue.  HENT: Negative.   Eyes: Negative.   Respiratory: Negative.  Negative for cough, choking, chest tightness, shortness of breath, wheezing and stridor.     Cardiovascular: Negative.  Negative for chest pain, palpitations and leg swelling.  Gastrointestinal: Negative.  Negative for nausea, vomiting, abdominal pain, diarrhea, constipation and blood in stool.  Endocrine: Negative.   Genitourinary: Positive for dysuria and frequency. Negative for urgency, hematuria, flank pain, decreased urine volume, discharge, penile swelling, scrotal swelling, enuresis, difficulty urinating, genital sores, penile pain and testicular pain.  Musculoskeletal: Negative.  Negative for myalgias and neck pain.  Skin: Negative.  Negative for rash.  Allergic/Immunologic: Negative.   Neurological: Negative.  Negative for dizziness, syncope, speech difficulty, weakness, light-headedness, numbness and headaches.  Hematological: Negative.  Does not bruise/bleed easily.  Psychiatric/Behavioral: Negative.     Objective:  BP 124/76 mmHg  Pulse 67  Temp(Src) 98 F (36.7 C) (Oral)  Resp 16  Ht 5\' 8"  (1.727 m)  Wt 128 lb (58.06 kg)  BMI 19.47 kg/m2  SpO2 97%  BP Readings from Last 3 Encounters:  02/26/15 124/76  12/05/14 136/72  05/23/14 108/64    Wt Readings from Last 3 Encounters:  02/26/15 128 lb (58.06 kg)  12/12/14 128 lb (58.06 kg)  12/05/14 127 lb 14.4 oz (58.015 kg)    Physical Exam  Constitutional: He is oriented to person, place, and time. He appears well-developed and well-nourished. No distress.  HENT:  Head: Normocephalic and atraumatic.  Mouth/Throat: Oropharynx is clear and moist. No oropharyngeal exudate.  Eyes: Conjunctivae are normal. Right eye exhibits no discharge. Left  eye exhibits no discharge. No scleral icterus.  Neck: Normal range of motion. Neck supple. No JVD present. No tracheal deviation present. No thyromegaly present.  Cardiovascular: Normal rate, regular rhythm, normal heart sounds and intact distal pulses.  Exam reveals no gallop and no friction rub.   No murmur heard. Pulmonary/Chest: Effort normal and breath sounds normal. No  stridor. No respiratory distress. He has no wheezes. He has no rales. He exhibits no tenderness.  Abdominal: Soft. Bowel sounds are normal. He exhibits no distension and no mass. There is no tenderness. There is no rebound and no guarding. Hernia confirmed negative in the right inguinal area and confirmed negative in the left inguinal area.  Genitourinary: Rectum normal, testes normal and penis normal. Rectal exam shows no external hemorrhoid, no internal hemorrhoid, no fissure, no mass, no tenderness and anal tone normal. Guaiac negative stool. Prostate is enlarged (rt lobe is larger than left lobe and is boggy). Prostate is not tender. Right testis shows no mass, no swelling and no tenderness. Right testis is descended. Left testis shows no mass, no swelling and no tenderness. Left testis is descended. Circumcised. No penile erythema or penile tenderness. No discharge found.  Musculoskeletal: Normal range of motion. He exhibits no edema or tenderness.  Lymphadenopathy:    He has no cervical adenopathy.       Right: No inguinal adenopathy present.       Left: No inguinal adenopathy present.  Neurological: He is oriented to person, place, and time.  Skin: Skin is warm and dry. No rash noted. He is not diaphoretic. No erythema. No pallor.  Psychiatric: He has a normal mood and affect. His behavior is normal. Judgment and thought content normal.  Vitals reviewed.   Lab Results  Component Value Date   WBC 6.8 02/26/2015   HGB 14.9 02/26/2015   HCT 44.0 02/26/2015   PLT 262.0 02/26/2015   GLUCOSE 96 02/26/2015   CHOL 193 02/26/2015   TRIG 117.0 02/26/2015   HDL 36.30* 02/26/2015   LDLCALC 133* 02/26/2015   ALT 13 02/26/2015   AST 13 02/26/2015   NA 142 02/26/2015   K 4.5 02/26/2015   CL 106 02/26/2015   CREATININE 0.88 02/26/2015   BUN 14 02/26/2015   CO2 31 02/26/2015   TSH 4.08 02/26/2015   PSA 0.17 02/26/2015   INR 1.0 04/15/2011   HGBA1C 5.1 04/15/2011    Mr Knee Left  Wo  Contrast  07/20/2014  CLINICAL DATA:  Chronic left knee pain for 6 months. Medial pain during weight-bearing. EXAM: MRI OF THE LEFT KNEE WITHOUT CONTRAST TECHNIQUE: Multiplanar, multisequence MR imaging of the knee was performed. No intravenous contrast was administered. COMPARISON:  None. FINDINGS: MENISCI Medial meniscus: Intact. Mild increased signal in the posterior horn of the medial meniscus likely reflecting degeneration. Lateral meniscus:  Intact. LIGAMENTS Cruciates:  Intact ACL and PCL. Collaterals: Medial collateral ligament is intact. Lateral collateral ligament complex is intact. CARTILAGE Patellofemoral:  No focal chondral defect. Medial: High-grade partial-thickness cartilage loss of the medial femoral condyle and medial tibial plateau. Lateral:  No focal chondral defect. Joint: No joint effusion. Normal Hoffa's fat. No plical thickening. Popliteal Fossa: Intact popliteus tendon. No significant Baker cyst. Extensor Mechanism:  Intact. Bones:  No focal marrow signal abnormality. IMPRESSION: 1. No meniscal or ligamentous injury of the left knee. Mild increased signal in the posterior horn of the medial meniscus likely reflecting degeneration. 2. High-grade partial-thickness cartilage loss involving the medial femoral condyle and medial tibial  plateau. Electronically Signed   By: Kathreen Devoid   On: 07/20/2014 11:15    Assessment & Plan:   Oakley was seen today for annual exam, hypothyroidism, urinary tract infection and hyperlipidemia.  Diagnoses and all orders for this visit:  Other specified hypothyroidism- his TSH is in the normal range she will remain on the current dose of Synthroid -     Lipid panel; Future -     TSH; Future -     levothyroxine (SYNTHROID, LEVOTHROID) 25 MCG tablet; TAKE 1 TABLET BY MOUTH EVERY DAY BEFORE BREAKFAST  Hyperlipidemia with target LDL less than 130- his Framingham risk score is 20% will start a statin. -     Lipid panel; Future -     TSH; Future -      Comprehensive metabolic panel; Future -     atorvastatin (LIPITOR) 20 MG tablet; Take 1 tablet (20 mg total) by mouth daily.  BPH (benign prostatic hyperplasia)- he has persistent symptoms so we'll add Flomax to the prior scar -     PSA; Future -     tamsulosin (FLOMAX) 0.4 MG CAPS capsule; Take 1 capsule (0.4 mg total) by mouth daily.  Essential hypertension- his blood pressure is well-controlled, lites and renal function are stable. -     Comprehensive metabolic panel; Future -     CBC with Differential/Platelet; Future  Dysuria- his UA shows a moderate amount of red blood cells, test for syphilis, gonorrhea and Chlamydia are all negative. Will treat him for acute prostatitis -     RPR; Future -     Urinalysis, Routine w reflex microscopic (not at [facility]); Future -     GC/chlamydia probe amp, urine; Future -     HIV antibody (with reflex) -     ciprofloxacin (CIPRO) 500 MG tablet; Take 1 tablet (500 mg total) by mouth 2 (two) times daily.  Prostatitis, acute- will treat for acute bacterial prostatitis with Cipro -     ciprofloxacin (CIPRO) 500 MG tablet; Take 1 tablet (500 mg total) by mouth 2 (two) times daily.  I am having Mr. Lares start on tamsulosin, ciprofloxacin, and atorvastatin. I am also having him maintain his omeprazole, Vitamins-Lipotropics (LIPOFLAVONOID PO), finasteride, Multiple Vitamins-Minerals (PRESERVISION AREDS PO), apixaban, diltiazem, acyclovir, flecainide, amLODipine, sildenafil, cycloSPORINE, and levothyroxine.  Meds ordered this encounter  Medications  . cycloSPORINE (RESTASIS) 0.05 % ophthalmic emulsion    Sig: Place 1 drop into both eyes 2 (two) times daily.  . tamsulosin (FLOMAX) 0.4 MG CAPS capsule    Sig: Take 1 capsule (0.4 mg total) by mouth daily.    Dispense:  90 capsule    Refill:  3  . levothyroxine (SYNTHROID, LEVOTHROID) 25 MCG tablet    Sig: TAKE 1 TABLET BY MOUTH EVERY DAY BEFORE BREAKFAST    Dispense:  30 tablet    Refill:  3  .  ciprofloxacin (CIPRO) 500 MG tablet    Sig: Take 1 tablet (500 mg total) by mouth 2 (two) times daily.    Dispense:  60 tablet    Refill:  1  . atorvastatin (LIPITOR) 20 MG tablet    Sig: Take 1 tablet (20 mg total) by mouth daily.    Dispense:  90 tablet    Refill:  3   See AVS for instructions about healthy living and anticipatory guidance.  Follow-up: Return in about 4 months (around 06/26/2015).  [name], MD

## 2015-02-27 ENCOUNTER — Encounter: Payer: Self-pay | Admitting: Internal Medicine

## 2015-02-27 LAB — HIV ANTIBODY (ROUTINE TESTING W REFLEX): HIV 1&2 Ab, 4th Generation: NONREACTIVE

## 2015-02-27 LAB — RPR

## 2015-02-27 LAB — GC/CHLAMYDIA PROBE AMP, URINE
Chlamydia, Swab/Urine, PCR: NEGATIVE
GC Probe Amp, Urine: NEGATIVE

## 2015-02-27 MED ORDER — ATORVASTATIN CALCIUM 20 MG PO TABS: 20.0000 mg | ORAL_TABLET | Freq: Every day | ORAL | Status: DC

## 2015-02-27 NOTE — Assessment & Plan Note (Signed)

## 2015-03-03 ENCOUNTER — Other Ambulatory Visit: Payer: Self-pay

## 2015-03-03 MED ORDER — FINASTERIDE 5 MG PO TABS: ORAL_TABLET | ORAL | Status: DC

## 2015-03-06 ENCOUNTER — Encounter (HOSPITAL_COMMUNITY): Payer: Self-pay

## 2015-03-06 ENCOUNTER — Observation Stay (HOSPITAL_COMMUNITY)
Admission: EM | Admit: 2015-03-06 | Discharge: 2015-03-07 | Disposition: A | Discharge status: Home / Self Care | Payer: Medicare Other | Attending: Internal Medicine | Admitting: Internal Medicine

## 2015-03-06 ENCOUNTER — Emergency Department (HOSPITAL_COMMUNITY): Payer: Medicare Other

## 2015-03-06 DIAGNOSIS — Z87891 Personal history of nicotine dependence: Secondary | ICD-10-CM | POA: Diagnosis not present

## 2015-03-06 DIAGNOSIS — K219 Gastro-esophageal reflux disease without esophagitis: Secondary | ICD-10-CM | POA: Diagnosis not present

## 2015-03-06 DIAGNOSIS — R000 Tachycardia, unspecified: Secondary | ICD-10-CM

## 2015-03-06 DIAGNOSIS — I484 Atypical atrial flutter: Secondary | ICD-10-CM | POA: Insufficient documentation

## 2015-03-06 DIAGNOSIS — N400 Enlarged prostate without lower urinary tract symptoms: Secondary | ICD-10-CM | POA: Insufficient documentation

## 2015-03-06 DIAGNOSIS — N419 Inflammatory disease of prostate, unspecified: Secondary | ICD-10-CM | POA: Insufficient documentation

## 2015-03-06 DIAGNOSIS — I4892 Unspecified atrial flutter: Secondary | ICD-10-CM | POA: Diagnosis not present

## 2015-03-06 DIAGNOSIS — I10 Essential (primary) hypertension: Secondary | ICD-10-CM | POA: Diagnosis not present

## 2015-03-06 DIAGNOSIS — E038 Other specified hypothyroidism: Secondary | ICD-10-CM

## 2015-03-06 DIAGNOSIS — Z7901 Long term (current) use of anticoagulants: Secondary | ICD-10-CM | POA: Diagnosis not present

## 2015-03-06 DIAGNOSIS — E785 Hyperlipidemia, unspecified: Secondary | ICD-10-CM | POA: Diagnosis not present

## 2015-03-06 DIAGNOSIS — Z79899 Other long term (current) drug therapy: Secondary | ICD-10-CM | POA: Diagnosis not present

## 2015-03-06 DIAGNOSIS — N410 Acute prostatitis: Secondary | ICD-10-CM

## 2015-03-06 DIAGNOSIS — E039 Hypothyroidism, unspecified: Secondary | ICD-10-CM | POA: Diagnosis not present

## 2015-03-06 DIAGNOSIS — Z7289 Other problems related to lifestyle: Secondary | ICD-10-CM | POA: Diagnosis not present

## 2015-03-06 DIAGNOSIS — I4891 Unspecified atrial fibrillation: Secondary | ICD-10-CM | POA: Diagnosis present

## 2015-03-06 DIAGNOSIS — I480 Paroxysmal atrial fibrillation: Secondary | ICD-10-CM | POA: Diagnosis not present

## 2015-03-06 DIAGNOSIS — E876 Hypokalemia: Secondary | ICD-10-CM | POA: Insufficient documentation

## 2015-03-06 HISTORY — DX: Pneumonia, unspecified organism: J18.9

## 2015-03-06 HISTORY — DX: Migraine with aura, not intractable, without status migrainosus: G43.109

## 2015-03-06 HISTORY — DX: Calculus of kidney: N20.0

## 2015-03-06 HISTORY — DX: Nonexudative age-related macular degeneration, left eye, stage unspecified: H35.3120

## 2015-03-06 HISTORY — DX: Major depressive disorder, single episode, unspecified: F32.9

## 2015-03-06 HISTORY — DX: Depression, unspecified: F32.A

## 2015-03-06 HISTORY — DX: Hypothyroidism, unspecified: E03.9

## 2015-03-06 HISTORY — DX: Family history of other specified conditions: Z84.89

## 2015-03-06 HISTORY — DX: Reserved for concepts with insufficient information to code with codable children: IMO0002

## 2015-03-06 HISTORY — DX: Basal cell carcinoma of skin, unspecified: C44.91

## 2015-03-06 LAB — CBC WITH DIFFERENTIAL/PLATELET
BASOS ABS: 0 10*3/uL (ref 0.0–0.1)
Basophils Relative: 0 %
EOS ABS: 0.2 10*3/uL (ref 0.0–0.7)
EOS PCT: 2 %
HCT: 43.7 % (ref 39.0–52.0)
HEMOGLOBIN: 15.1 g/dL (ref 13.0–17.0)
LYMPHS ABS: 3.3 10*3/uL (ref 0.7–4.0)
LYMPHS PCT: 41 %
MCH: 30.8 pg (ref 26.0–34.0)
MCHC: 34.6 g/dL (ref 30.0–36.0)
MCV: 89 fL (ref 78.0–100.0)
Monocytes Absolute: 0.5 10*3/uL (ref 0.1–1.0)
Monocytes Relative: 7 %
NEUTROS PCT: 50 %
Neutro Abs: 4.1 10*3/uL (ref 1.7–7.7)
PLATELETS: 251 10*3/uL (ref 150–400)
RBC: 4.91 MIL/uL (ref 4.22–5.81)
RDW: 12.6 % (ref 11.5–15.5)
WBC: 8.1 10*3/uL (ref 4.0–10.5)

## 2015-03-06 LAB — D-DIMER, QUANTITATIVE (NOT AT ARMC)

## 2015-03-06 LAB — BASIC METABOLIC PANEL
ANION GAP: 11 (ref 5–15)
BUN: 8 mg/dL (ref 6–20)
CHLORIDE: 108 mmol/L (ref 101–111)
CO2: 23 mmol/L (ref 22–32)
Calcium: 9.1 mg/dL (ref 8.9–10.3)
Creatinine, Ser: 1.09 mg/dL (ref 0.61–1.24)
GFR calc Af Amer: >60 mL/min (ref >60)
Glucose, Bld: 144 mg/dL — ABNORMAL HIGH (ref 65–99)
POTASSIUM: 3.6 mmol/L (ref 3.5–5.1)
SODIUM: 142 mmol/L (ref 135–145)

## 2015-03-06 LAB — TROPONIN I

## 2015-03-06 LAB — T4, FREE: FREE T4: 0.87 ng/dL (ref 0.61–1.12)

## 2015-03-06 LAB — I-STAT TROPONIN, ED: TROPONIN I, POC: 0.01 ng/mL (ref 0.00–0.08)

## 2015-03-06 MED ORDER — APIXABAN 5 MG PO TABS
5.0000 mg | ORAL_TABLET | Freq: Two times a day (BID) | ORAL | Status: DC
  Administered 2015-03-07 (×2): 5 mg via ORAL
  Filled 2015-03-06 (×2): qty 1

## 2015-03-06 MED ORDER — DEXTROSE 5 % IV SOLN
5.0000 mg/h | INTRAVENOUS | Status: DC
  Administered 2015-03-07: 5 mg/h via INTRAVENOUS
  Filled 2015-03-06: qty 100

## 2015-03-06 MED ORDER — CYCLOSPORINE 0.05 % OP EMUL
1.0000 [drp] | Freq: Two times a day (BID) | OPHTHALMIC | Status: DC
  Administered 2015-03-07 (×2): 1 [drp] via OPHTHALMIC
  Filled 2015-03-06 (×3): qty 1

## 2015-03-06 MED ORDER — DILTIAZEM LOAD VIA INFUSION
15.0000 mg | Freq: Once | INTRAVENOUS | Status: DC
  Filled 2015-03-06: qty 15

## 2015-03-06 MED ORDER — FLECAINIDE ACETATE 50 MG PO TABS
50.0000 mg | ORAL_TABLET | Freq: Two times a day (BID) | ORAL | Status: DC
  Administered 2015-03-07 (×2): 50 mg via ORAL
  Filled 2015-03-06 (×2): qty 1

## 2015-03-06 MED ORDER — PANTOPRAZOLE SODIUM 40 MG PO TBEC
40.0000 mg | DELAYED_RELEASE_TABLET | Freq: Every day | ORAL | Status: DC
  Administered 2015-03-07: 40 mg via ORAL
  Filled 2015-03-06: qty 1

## 2015-03-06 MED ORDER — TAMSULOSIN HCL 0.4 MG PO CAPS
0.4000 mg | ORAL_CAPSULE | Freq: Every day | ORAL | Status: DC
  Administered 2015-03-07: 0.4 mg via ORAL
  Filled 2015-03-06: qty 1

## 2015-03-06 MED ORDER — ATORVASTATIN CALCIUM 20 MG PO TABS
20.0000 mg | ORAL_TABLET | Freq: Every day | ORAL | Status: DC
  Administered 2015-03-07: 20 mg via ORAL
  Filled 2015-03-06: qty 1

## 2015-03-06 MED ORDER — SODIUM CHLORIDE 0.9 % IV BOLUS (SEPSIS)
2000.0000 mL | Freq: Once | INTRAVENOUS | Status: AC
  Administered 2015-03-06: 2000 mL via INTRAVENOUS

## 2015-03-06 MED ORDER — FINASTERIDE 5 MG PO TABS
5.0000 mg | ORAL_TABLET | Freq: Every day | ORAL | Status: DC
  Administered 2015-03-07: 5 mg via ORAL
  Filled 2015-03-06: qty 1

## 2015-03-06 MED ORDER — ZOLPIDEM TARTRATE 5 MG PO TABS
5.0000 mg | ORAL_TABLET | Freq: Every evening | ORAL | Status: DC | PRN
  Filled 2015-03-06: qty 1

## 2015-03-06 MED ORDER — AMLODIPINE BESYLATE 5 MG PO TABS
5.0000 mg | ORAL_TABLET | Freq: Every day | ORAL | Status: DC
  Administered 2015-03-07: 5 mg via ORAL
  Filled 2015-03-06: qty 1

## 2015-03-06 MED ORDER — LEVOTHYROXINE SODIUM 25 MCG PO TABS
25.0000 ug | ORAL_TABLET | Freq: Every day | ORAL | Status: DC
  Administered 2015-03-07: 25 ug via ORAL
  Filled 2015-03-06: qty 1

## 2015-03-06 MED ORDER — LORAZEPAM 2 MG/ML IJ SOLN
1.5000 mg | Freq: Once | INTRAMUSCULAR | Status: AC
  Administered 2015-03-06: 1.5 mg via INTRAVENOUS
  Filled 2015-03-06: qty 1

## 2015-03-06 NOTE — Consult Note (Signed)
Reason for Consult: tachycardia, palpitations Primary Cardiologist: Dr. Martinique Referring Physician: Dr. [name] is an 64 y.o. male.  HPI: Roy Long is a 64 yo man with PMH of paroxysmal atrial fibrillation on flecainide for 11 years, hypertension, hypothyroidism, GERD, previous alcohol abuse in AA abstinence dating to 2013 last seen by Dr. Martinique 12/05/14 who presents with tachycardia/palpitations that began his morning although he went to work. He was given IV fluids, ativan in the ER with workup for tachycardia unrevealing. He denies associated presyncope/syncope or dizziness.   During his last visit he disucssed that he had had 4 episodes of atrial fibrillation in the preceding 6 months with symptoms resolving with flecainide and diltiazem. He had an echocardiogram 9/16 which was essential normal and 9/16 nuclear study with 13.4 METS and low risk study. He is on eliquis for anticoagulation.   He tells me that these episodes have increased in frequency. However, they have never lasted this long typically resolving in 3-4 hours. No known new triggers. No infectious symptoms or new stress today. He says the sensation typically starts as palpitations that are irregular and then following with faster regular heart rate. He has discussed ablation previously but symptoms had largely been stable. No current chest pain. No syncope. No issues with his medications.        Past Medical History  Diagnosis Date  . Atrial fibrillation ([place])   . Hypertension   . Alcohol dependence ([place])   . GERD (gastroesophageal reflux disease)   . History of kidney stones   . Cancer of skin, squamous cell     hand, chest  . Macular degeneration   . Arthritis   . Hypercholesterolemia   . Thyroid disease     Past Surgical History  Procedure Laterality Date  . Cervical fusion    . Hernia repair      Family History  Problem Relation Age of Onset  . Heart failure Mother   . Stroke Brother     X2  . Cancer Brother   . Heart disease Neg Hx   . Hyperlipidemia Neg Hx   . Hypertension Neg Hx   . Diabetes Neg Hx     Social History:  reports that he quit smoking about 31 years ago. He has never used smokeless tobacco. He reports that he does not drink alcohol or use illicit drugs.  Allergies: No Known Allergies  Medications:  I have reviewed the patient's current medications. Prior to Admission:  Prescriptions prior to admission  Medication Sig Dispense Refill Last Dose  . acetaminophen (TYLENOL) 325 MG tablet Take 325 mg by mouth every 6 (six) hours as needed for mild pain, moderate pain or headache.   over 30 days  . acyclovir (ZOVIRAX) 400 MG tablet TAKE 1 TABLET THREE TIMES A DAY (Patient taking differently: TAKE 400 MG BY MOUTH DAILY) 90 tablet 3 03/06/2015 at Unknown time  . amLODipine (NORVASC) 5 MG tablet TAKE 1 TABLET (5 MG TOTAL) BY MOUTH DAILY. 90 tablet 1 03/06/2015 at Unknown time  . apixaban (ELIQUIS) 5 MG TABS tablet Take 1 tablet (5 mg total) by mouth 2 (two) times daily. 180 tablet 3 03/06/2015 at 700  . atorvastatin (LIPITOR) 20 MG tablet Take 1 tablet (20 mg total) by mouth daily. (Patient taking differently: Take 20 mg by mouth daily at 6 PM. ) 90 tablet 3 03/05/2015 at Unknown time  . ciprofloxacin (CIPRO) 500 MG tablet Take 1 tablet (500 mg total) by mouth 2 (  two) times daily. 60 tablet 1 03/06/2015 at am  . cycloSPORINE (RESTASIS) 0.05 % ophthalmic emulsion Place 1 drop into both eyes 2 (two) times daily.   03/06/2015 at Unknown time  . diltiazem (CARDIZEM CD) 180 MG 24 hr capsule Take 1 capsule (180 mg total) by mouth daily. (Patient taking differently: Take 180 mg by mouth as needed (tachycardia). ) 30 capsule 3 03/06/2015 at Unknown time  . finasteride (PROSCAR) 5 MG tablet TAKE 1 TABLET BY MOUTH DAILY (Patient taking differently: Take 5 mg by mouth daily. ) 90 tablet 3 03/06/2015 at Unknown time  . flecainide (TAMBOCOR) 50 MG tablet Take 1 tablet (50 mg total) by  mouth 2 (two) times daily. 180 tablet 3 03/06/2015 at Unknown time  . levothyroxine (SYNTHROID, LEVOTHROID) 25 MCG tablet TAKE 1 TABLET BY MOUTH EVERY DAY BEFORE BREAKFAST (Patient taking differently: Take 25 mcg by mouth daily before breakfast. ) 30 tablet 3 03/06/2015 at Unknown time  . Multiple Vitamins-Minerals (PRESERVISION AREDS PO) Take 1 mg by mouth 2 (two) times daily.    03/06/2015 at Unknown time  . omeprazole (PRILOSEC) 20 MG capsule Take 20 mg by mouth daily.   03/06/2015 at Unknown time  . sildenafil (VIAGRA) 100 MG tablet Take 0.5-1 tablets (50-100 mg total) by mouth daily as needed for erectile dysfunction. 10 tablet 11 over 30 days  . tamsulosin (FLOMAX) 0.4 MG CAPS capsule Take 1 capsule (0.4 mg total) by mouth daily. 90 capsule 3 03/05/2015 at Unknown time  . Vitamins-Lipotropics (LIPOFLAVONOID PO) Take 3 tablets by mouth daily at 12 noon.    03/06/2015 at Unknown time   Scheduled: . amLODipine  5 mg Oral Daily  . apixaban  5 mg Oral BID  . atorvastatin  20 mg Oral q1800  . cycloSPORINE  1 drop Both Eyes BID  . diltiazem  15 mg Intravenous Once  . finasteride  5 mg Oral Daily  . flecainide  50 mg Oral BID  . levothyroxine  25 mcg Oral QAC breakfast  . pantoprazole  40 mg Oral Daily  . tamsulosin  0.4 mg Oral Daily    Results for orders placed or performed during the hospital encounter of 03/06/15 (from the past 48 hour(s))  CBC with Differential     Status: None   Collection Time: 03/06/15  5:05 PM  Result Value Ref Range   WBC 8.1 4.0 - 10.5 K/uL   RBC 4.91 4.22 - 5.81 MIL/uL   Hemoglobin 15.1 13.0 - 17.0 g/dL   HCT 43.7 39.0 - 52.0 %   MCV 89.0 78.0 - 100.0 fL   MCH 30.8 26.0 - 34.0 pg   MCHC 34.6 30.0 - 36.0 g/dL   RDW 12.6 11.5 - 15.5 %   Platelets 251 150 - 400 K/uL   Neutrophils Relative % 50 %   Neutro Abs 4.1 1.7 - 7.7 K/uL   Lymphocytes Relative 41 %   Lymphs Abs 3.3 0.7 - 4.0 K/uL   Monocytes Relative 7 %   Monocytes Absolute 0.5 0.1 - 1.0 K/uL    Eosinophils Relative 2 %   Eosinophils Absolute 0.2 0.0 - 0.7 K/uL   Basophils Relative 0 %   Basophils Absolute 0.0 0.0 - 0.1 K/uL  Basic metabolic panel     Status: Abnormal   Collection Time: 03/06/15  5:05 PM  Result Value Ref Range   Sodium 142 135 - 145 mmol/L   Potassium 3.6 3.5 - 5.1 mmol/L   Chloride 108 101 - 111  mmol/L   CO2 23 22 - 32 mmol/L   Glucose, Bld 144 (H) 65 - 99 mg/dL   BUN 8 6 - 20 mg/dL   Creatinine, Ser 1.09 0.61 - 1.24 mg/dL   Calcium 9.1 8.9 - 10.3 mg/dL   GFR calc non Af Amer >60 >60 mL/min   GFR calc Af Amer >60 >60 mL/min    Comment: (NOTE) The eGFR has been calculated using the CKD EPI equation. This calculation has not been validated in all clinical situations. eGFR's persistently <60 mL/min signify possible Chronic Kidney Disease.    Anion gap 11 5 - 15  Troponin I     Status: None   Collection Time: 03/06/15  5:05 PM  Result Value Ref Range   Troponin I <0.03 <0.031 ng/mL    Comment:        NO INDICATION OF MYOCARDIAL INJURY.   D-dimer, quantitative (not at [facility])     Status: None   Collection Time: 03/06/15  5:05 PM  Result Value Ref Range   D-Dimer, Quant <0.27 0.00 - 0.50 ug/mL-FEU    Comment: (NOTE) At the manufacturer cut-off of 0.50 ug/mL FEU, this assay has been documented to exclude PE with a sensitivity and negative predictive value of 97 to 99%.  At this time, this assay has not been approved by the FDA to exclude DVT/VTE. Results should be correlated with clinical presentation.   I-Stat Troponin, ED (not at [facility])     Status: None   Collection Time: 03/06/15  5:21 PM  Result Value Ref Range   Troponin i, poc 0.01 0.00 - 0.08 ng/mL   Comment 3            Comment: Due to the release kinetics of cTnI, a negative result within the first hours of the onset of symptoms does not rule out myocardial infarction with certainty. If myocardial infarction is still suspected, repeat the test at appropriate intervals.     Dg Chest 2  View  03/06/2015  CLINICAL DATA:  Tachycardia and atrial fibrillation for 1 day EXAM: CHEST - 2 VIEW COMPARISON:  04/30/2009 FINDINGS: Cardiac shadow is within normal limits. The lungs are well aerated bilaterally. Parenchymal scarring is again seen in the right apex stable from the prior exam. No focal infiltrate or sizable effusion is noted. No bony abnormality is seen. IMPRESSION: No active disease. Electronically Signed   By: Inez Catalina M.D.   On: 03/06/2015 18:15    Review of Systems  Constitutional: Positive for malaise/fatigue. Negative for fever, chills and diaphoresis.  HENT: Negative for ear discharge and tinnitus.   Eyes: Negative for double vision, photophobia and pain.  Respiratory: Negative for cough and hemoptysis.   Cardiovascular: Positive for palpitations. Negative for orthopnea, leg swelling and PND.  Gastrointestinal: Negative for nausea, vomiting, abdominal pain and diarrhea.  Genitourinary: Negative for dysuria, urgency and hematuria.  Musculoskeletal: Negative for myalgias, back pain and neck pain.  Skin: Negative for rash.  Neurological: Negative for dizziness, tingling, tremors, sensory change, speech change and weakness.  Endo/Heme/Allergies: Does not bruise/bleed easily.  Psychiatric/Behavioral: Negative for depression, suicidal ideas, hallucinations and substance abuse.   Blood pressure 129/85, pulse 137, temperature 98 F (36.7 C), temperature source Oral, resp. rate 19, height '5\' 8"'  (1.727 m), weight 56.7 kg (125 lb), SpO2 97 %. Physical Exam  Nursing note and vitals reviewed. Constitutional: He is oriented to person, place, and time. He appears well-developed and well-nourished. No distress.  HENT:  Head: Normocephalic and  atraumatic.  Nose: Nose normal.  Mouth/Throat: Oropharynx is clear and moist. No oropharyngeal exudate.  Eyes: Conjunctivae and EOM are normal. Pupils are equal, round, and reactive to light. No scleral icterus.  Neck: Normal range of  motion. Neck supple. No JVD present. No tracheal deviation present.  Cardiovascular: Normal rate, regular rhythm, normal heart sounds and intact distal pulses.  Exam reveals no gallop.   No murmur heard. Respiratory: Breath sounds normal. No respiratory distress. He has no wheezes.  GI: Soft. Bowel sounds are normal. He exhibits no distension. There is no tenderness. There is no rebound.  Musculoskeletal: Normal range of motion. He exhibits no edema or tenderness.  Neurological: He is alert and oriented to person, place, and time. No cranial nerve deficit. Coordination normal.  Skin: Skin is warm and dry. No rash noted. He is not diaphoretic. No erythema.  Psychiatric: He has a normal mood and affect. His behavior is normal. Judgment and thought content normal.   Labs reviewed; na 142, K 3.6, bun/cr 8/1.1, trop <0.03, Chest x-ray no acute process D-dimer <0.27 9/16 echo: EF 60-65%, grade I DD 9/16 Nuclear stress: 13.4 METs, low risk study EKG reviewed; HR 143, 2:1 atrial flutter, anteroseptal MI age indeterminate TSH 4.0 11/30, ldl 133, Tri 117  Assessment/Plan: Roy Long is a 64 yo man with PMH of paroxysmal atrial fibrillation on flecainide for 11 years, hypertension, hypothyroidism, GERD, previous alcohol abuse abstinent dating to 2013 who presents with palpitations. Differential diagnosis is ectopic atrial tachycardia, sinus tachycardia, atrial flutter. Structurally normal heart and no ischemia/negative stress September 2016 with long-standing history of atrial fibrillation here with what appears to be atrial flutter. For now, continue home anticoagulation with apixaban and plan to discuss with EP. He converted late evening so no need for TEE-DCCV. I think the next best steps are to discuss his symptoms and clinical case with EP for possible atrial flutter +/- atrial fibrillation ablation. Will also evaluate for triggers such as infection, thyroid disease, toxins, development of heart failure.  CHADS2VASC = 2 (age, hypertension). He should be able to go home later this morning most likely.   Problem List Tachycardia: Atrial flutter 2:1 Hypertension Hypothyroidism Paroxysmal atrial fibrillation - watch on telemetry overnight - anticoagulation with apixaban bid (continue) - reasonable to continue home flecainide  - consult EP in AM for consideration of atrial fibrillation/flutter ablation   Maicie Vanderloop 03/06/2015, 9:53 PM

## 2015-03-06 NOTE — Telephone Encounter (Signed)
Received a call from patient.He stated he has been in AFib since 7:00 am this morning.Stated he took a extra Flecainide 100 mg and Diltiazem 180 mg.Stated usually in 3 to 5 hours after taking his heart beat slows down.Stated medication has not helped.He continues to be in AFib with a rate of 140.Advised he needs to go to [facility] ER.Trish called.

## 2015-03-06 NOTE — ED Provider Notes (Signed)
CSN: [id number]     Arrival date & time 03/06/15  1647 History   First MD Initiated Contact with Patient 03/06/15 1703     Chief Complaint  Patient presents with  . Tachycardia   Patient is a 64 y.o. male presenting with palpitations. The history is provided by the patient.  Palpitations Palpitations quality:  Irregular Onset quality:  Sudden Timing:  Constant Progression:  Unchanged Chronicity:  New Relieved by:  Nothing Associated symptoms: dizziness   Associated symptoms: no back pain, no chest pain, no cough, no diaphoresis, no lower extremity edema, no nausea, no shortness of breath and no vomiting   Risk factors: hx of atrial fibrillation and hx of thyroid disease     Past Medical History  Diagnosis Date  . Atrial fibrillation ([place])   . Hypertension   . Alcohol dependence ([place])   . GERD (gastroesophageal reflux disease)   . History of kidney stones   . Cancer of skin, squamous cell     hand, chest  . Macular degeneration   . Arthritis   . Hypercholesterolemia   . Thyroid disease    Past Surgical History  Procedure Laterality Date  . Cervical fusion    . Hernia repair     Family History  Problem Relation Age of Onset  . Heart failure Mother   . Stroke Brother     X2  . Cancer Brother   . Heart disease Neg Hx   . Hyperlipidemia Neg Hx   . Hypertension Neg Hx   . Diabetes Neg Hx    Social History  Substance Use Topics  . Smoking status: Former Smoker    Quit date: 03/30/1983  . Smokeless tobacco: Never Used  . Alcohol Use: No    Review of Systems  Constitutional: Negative for fever, chills and diaphoresis.  HENT: Negative for rhinorrhea and sore throat.   Eyes: Negative for visual disturbance.  Respiratory: Negative for cough and shortness of breath.   Cardiovascular: Positive for palpitations. Negative for chest pain.  Gastrointestinal: Negative for nausea, vomiting, abdominal pain, diarrhea and constipation.  Genitourinary: Negative for dysuria and  hematuria.  Musculoskeletal: Negative for back pain and neck pain.  Skin: Negative for rash.  Neurological: Positive for dizziness. Negative for syncope and headaches.  Psychiatric/Behavioral: Negative for confusion.  All other systems reviewed and are negative.  Allergies  Review of patient's allergies indicates no known allergies.  Home Medications   Prior to Admission medications   Medication Sig Start Date End Date Taking? Authorizing Provider  acetaminophen (TYLENOL) 325 MG tablet Take 325 mg by mouth every 6 (six) hours as needed for mild pain, moderate pain or headache.   Yes Historical Provider, MD  acyclovir (ZOVIRAX) 400 MG tablet TAKE 1 TABLET THREE TIMES A DAY Patient taking differently: TAKE 400 MG BY MOUTH DAILY 05/30/14  Yes Janith Lima, MD  amLODipine (NORVASC) 5 MG tablet TAKE 1 TABLET (5 MG TOTAL) BY MOUTH DAILY. 10/07/14  Yes Peter M Martinique, MD  apixaban (ELIQUIS) 5 MG TABS tablet Take 1 tablet (5 mg total) by mouth 2 (two) times daily. 05/23/14  Yes Peter M Martinique, MD  atorvastatin (LIPITOR) 20 MG tablet Take 1 tablet (20 mg total) by mouth daily. Patient taking differently: Take 20 mg by mouth daily at 6 PM.  02/27/15  Yes Janith Lima, MD  ciprofloxacin (CIPRO) 500 MG tablet Take 1 tablet (500 mg total) by mouth 2 (two) times daily. 02/26/15  Yes Janith Lima,  MD  cycloSPORINE (RESTASIS) 0.05 % ophthalmic emulsion Place 1 drop into both eyes 2 (two) times daily.   Yes Historical Provider, MD  diltiazem (CARDIZEM CD) 180 MG 24 hr capsule Take 1 capsule (180 mg total) by mouth daily. Patient taking differently: Take 180 mg by mouth as needed (tachycardia).  05/23/14  Yes Peter M Martinique, MD  finasteride (PROSCAR) 5 MG tablet TAKE 1 TABLET BY MOUTH DAILY Patient taking differently: Take 5 mg by mouth daily.  03/03/15  Yes Janith Lima, MD  flecainide (TAMBOCOR) 50 MG tablet Take 1 tablet (50 mg total) by mouth 2 (two) times daily. 07/24/14  Yes Peter M Martinique, MD   levothyroxine (SYNTHROID, LEVOTHROID) 25 MCG tablet TAKE 1 TABLET BY MOUTH EVERY DAY BEFORE BREAKFAST Patient taking differently: Take 25 mcg by mouth daily before breakfast.  02/26/15  Yes Janith Lima, MD  Multiple Vitamins-Minerals (PRESERVISION AREDS PO) Take 1 mg by mouth 2 (two) times daily.    Yes Historical Provider, MD  omeprazole (PRILOSEC) 20 MG capsule Take 20 mg by mouth daily.   Yes Historical Provider, MD  sildenafil (VIAGRA) 100 MG tablet Take 0.5-1 tablets (50-100 mg total) by mouth daily as needed for erectile dysfunction. 11/04/14  Yes Janith Lima, MD  tamsulosin (FLOMAX) 0.4 MG CAPS capsule Take 1 capsule (0.4 mg total) by mouth daily. 02/26/15  Yes Janith Lima, MD  Vitamins-Lipotropics (LIPOFLAVONOID PO) Take 3 tablets by mouth daily at 12 noon.    Yes Historical Provider, MD   BP 151/89 mmHg  Pulse 137  Temp(Src) 98 F (36.7 C) (Oral)  Resp 18  Ht 5\' 8"  (1.727 m)  Wt 56.7 kg  BMI 19.01 kg/m2  SpO2 100% Physical Exam  Constitutional: He is oriented to person, place, and time. He appears well-developed and well-nourished. No distress.  HENT:  Head: Normocephalic and atraumatic.  Mouth/Throat: Oropharynx is clear and moist.  Eyes: EOM are normal.  Neck: Neck supple. No JVD present.  Cardiovascular: Normal heart sounds and intact distal pulses.  An irregularly irregular rhythm present. Tachycardia present.   Pulmonary/Chest: Effort normal and breath sounds normal.  Abdominal: Soft. He exhibits no distension. There is no tenderness.  Musculoskeletal: Normal range of motion. He exhibits no edema.  Neurological: He is alert and oriented to person, place, and time. No cranial nerve deficit.  Skin: Skin is warm and dry.  Psychiatric: His behavior is normal.    ED Course  Procedures  None   Labs Review Labs Reviewed  BASIC METABOLIC PANEL - Abnormal; Notable for the following:    Glucose, Bld 144 (*)    All other components within normal limits  CBC WITH  DIFFERENTIAL/PLATELET  TROPONIN I  D-DIMER, QUANTITATIVE (NOT AT [facility])  T4, FREE  I-STAT TROPOININ, ED    Imaging Review Dg Chest 2 View  03/06/2015  CLINICAL DATA:  Tachycardia and atrial fibrillation for 1 day EXAM: CHEST - 2 VIEW COMPARISON:  04/30/2009 FINDINGS: Cardiac shadow is within normal limits. The lungs are well aerated bilaterally. Parenchymal scarring is again seen in the right apex stable from the prior exam. No focal infiltrate or sizable effusion is noted. No bony abnormality is seen. IMPRESSION: No active disease. Electronically Signed   By: Inez Catalina M.D.   On: 03/06/2015 18:15   I have personally reviewed and evaluated these images and lab results as part of my medical decision-making.   EKG Interpretation   Date/Time:  Thursday March 06 2015 16:55:02 EST  Ventricular Rate:  143 PR Interval:  136 QRS Duration: 94 QT Interval:  270 QTC Calculation: 416 R Axis:   107 Text Interpretation:  Sinus tachycardia Septal MI (old or age  indeterminate) Non-specific ST-t changes Confirmed by Wilson Singer  MD, STEPHEN  ([id number]) on 03/06/2015 6:53:09 PM      MDM   Final diagnoses:  Tachycardia   64 yo M with a PMH of afib on flecanide and prn diltiazem who presents with palpitations. Onset this AM. He went to work and "went about my day" while still symptomatic. No syncope or chest pain or SOB. Came to ED for persistent symptoms. Took normal dose of flecanide this AM, then took extra 100 mg flecanide and diltiazem 180 mg when he was symptomatic today. HR 130s-140s here. 2L NS did not improve HR. Otherwise stable and asymptomatic. CXR unremarkable. Trop negative. EKG sinus tach, rate 143, no acute ischemic changes. Patient recently had normal TSH and has not had ETOH in 3 years. Doubt thyrotoxicosis or withdrawal syndrome. Will admit for persistent sinus tachycardia. Admitted to hospitalist service.  Discussed with Dr. Wilson Singer.  Gustavus Bryant, MD 03/06/15 [id number]  Virgel Manifold,  MD 03/07/15 [phone]

## 2015-03-06 NOTE — H&P (Signed)
History and Physical  Patient Name: Roy Long     [id number]    DOB: Oct 21, 1950    DOA: 03/06/2015 Referring physician: Virgel Manifold, MD PCP: [name], MD      Chief Complaint: Palpitations  HPI: Roy Long is a 64 y.o. male with a past medical history significant for pAF on flecainide, HTN, former alcohol use, hypothyroidism, and recent prostatitis who presents with palpitations for one day.  The patient woke up this morning feeling like "my heart was out of rhythm, and fast". Assumed it was his pAF, so he took an extra dose of flecainide and his diltiazem, which is what he normally does when this happens (used to happen about 4 times per year, and the last 9 months has been happening about once per month).  The symptoms didn't resolve, so he went to ER.  In the ED, the patient's ECG appeared to show sinus tachycardia.  He had no leukocytosis or focal s/s of infection.  He received 2L NS and his heart rate did not improve with 2 hours lying in bed.  He had a normal d-dimer, negative initial troponin and no chest discomfort.  He was given lorazepam and had no improvement in HR.  He denies all alcohol since 0000000 and no illicit drugs.  He did start ciprofloxacin for prostatitis 1 week ago (has three weeks left) and also atorvastatin for hyperlipidemia this week.  TRH were asked to admit for persistent tachycardia.      Review of Systems:  Pt complains of palpitations, heart racing. Pt denies any fever, chills, chest discomfort, orthopnea, dyspnea.  All other systems negative except as just noted or noted in the history of present illness.  No Known Allergies  Prior to Admission medications   Medication Sig Start Date End Date Taking? Authorizing Provider  acetaminophen (TYLENOL) 325 MG tablet Take 325 mg by mouth every 6 (six) hours as needed for mild pain, moderate pain or headache.   Yes Historical Provider, MD  acyclovir (ZOVIRAX) 400 MG tablet TAKE 1 TABLET THREE  TIMES A DAY Patient taking differently: TAKE 400 MG BY MOUTH DAILY 05/30/14  Yes Janith Lima, MD  amLODipine (NORVASC) 5 MG tablet TAKE 1 TABLET (5 MG TOTAL) BY MOUTH DAILY. 10/07/14  Yes Peter M Martinique, MD  apixaban (ELIQUIS) 5 MG TABS tablet Take 1 tablet (5 mg total) by mouth 2 (two) times daily. 05/23/14  Yes Peter M Martinique, MD  atorvastatin (LIPITOR) 20 MG tablet Take 1 tablet (20 mg total) by mouth daily. Patient taking differently: Take 20 mg by mouth daily at 6 PM.  02/27/15  Yes Janith Lima, MD  ciprofloxacin (CIPRO) 500 MG tablet Take 1 tablet (500 mg total) by mouth 2 (two) times daily. 02/26/15  Yes Janith Lima, MD  cycloSPORINE (RESTASIS) 0.05 % ophthalmic emulsion Place 1 drop into both eyes 2 (two) times daily.   Yes Historical Provider, MD  diltiazem (CARDIZEM CD) 180 MG 24 hr capsule Take 1 capsule (180 mg total) by mouth daily. Patient taking differently: Take 180 mg by mouth as needed (tachycardia).  05/23/14  Yes Peter M Martinique, MD  finasteride (PROSCAR) 5 MG tablet TAKE 1 TABLET BY MOUTH DAILY Patient taking differently: Take 5 mg by mouth daily.  03/03/15  Yes Janith Lima, MD  flecainide (TAMBOCOR) 50 MG tablet Take 1 tablet (50 mg total) by mouth 2 (two) times daily. 07/24/14  Yes Peter M Martinique, MD  levothyroxine ([place], [place])  25 MCG tablet TAKE 1 TABLET BY MOUTH EVERY DAY BEFORE BREAKFAST Patient taking differently: Take 25 mcg by mouth daily before breakfast.  02/26/15  Yes Janith Lima, MD  Multiple Vitamins-Minerals (PRESERVISION AREDS PO) Take 1 mg by mouth 2 (two) times daily.    Yes Historical Provider, MD  omeprazole (PRILOSEC) 20 MG capsule Take 20 mg by mouth daily.   Yes Historical Provider, MD  sildenafil (VIAGRA) 100 MG tablet Take 0.5-1 tablets (50-100 mg total) by mouth daily as needed for erectile dysfunction. 11/04/14  Yes Janith Lima, MD  tamsulosin (FLOMAX) 0.4 MG CAPS capsule Take 1 capsule (0.4 mg total) by mouth daily. 02/26/15  Yes  Janith Lima, MD  Vitamins-Lipotropics (LIPOFLAVONOID PO) Take 3 tablets by mouth daily at 12 noon.    Yes Historical Provider, MD    Past Medical History  Diagnosis Date  . Atrial fibrillation ([place])   . Hypertension   . Alcohol dependence ([place])   . GERD (gastroesophageal reflux disease)   . History of kidney stones   . Cancer of skin, squamous cell     hand, chest  . Macular degeneration   . Arthritis   . Hypercholesterolemia   . Thyroid disease     Past Surgical History  Procedure Laterality Date  . Cervical fusion    . Hernia repair      Family history: family history includes Cancer in his brother; Heart failure in his mother; Stroke in his brother. There is no history of Heart disease, Hyperlipidemia, Hypertension, or Diabetes.  Social History: Patient lives alone.  He is a recovering alcoholic and does not use alcohol or illicit drugs.  He has a remote history of smoking.  He is retired.       Physical Exam: BP 129/88 mmHg  Pulse 136  Temp(Src) 98 F (36.7 C) (Oral)  Resp 20  Ht 5\' 8"  (1.727 m)  Wt 56.7 kg (125 lb)  BMI 19.01 kg/m2  SpO2 99% General appearance: Well-developed, adult male, alert and in no acute distress.   Eyes: Anicteric, conjunctiva pink, lids and lashes normal.     ENT: No nasal deformity, discharge, or epistaxis.  OP moist without lesions.  No thyromegaly or nodules.   Lymph: No cervical, supraclavicular lymphadenopathy. Skin: Warm and dry.  Pale.  No suspicious rashes on face, neck, back, trunk, shins. Cardiac: Tachycardic, regular, nl S1-S2, no murmurs appreciated.  No LE edema.  Radial pulses 2+ and symmetric. Respiratory: Normal respiratory rate and rhythm.  CTAB without rales or wheezes. Abdomen: Abdomen soft without rigidity.  No TTP. No ascites, distension.   MSK: No deformities or effusions. Neuro: Sensorium intact and responding to questions, attention normal.  Speech is fluent.  Moves all extremities equally and with normal  coordination.    Psych: Behavior appropriate.  Affect normal.  No evidence of aural or visual hallucinations or delusions.       Labs on Admission:  The metabolic panel shows normal sodium, potassium, bicarbonate, and renal function. A d-dimer is negative. Troponin is negative. The free T4 is negative, and a TSH one week ago was normal. A recent RPR and HIV are negative. The complete blood count shows no leukocytosis or anemia.   Radiological Exams on Admission: Personally reviewed: Dg Chest 2 View  03/06/2015  CLINICAL DATA:  Tachycardia and atrial fibrillation for 1 day EXAM: CHEST - 2 VIEW COMPARISON:  04/30/2009 FINDINGS: Cardiac shadow is within normal limits. The lungs are well aerated bilaterally. Parenchymal  scarring is again seen in the right apex stable from the prior exam. No focal infiltrate or sizable effusion is noted. No bony abnormality is seen. IMPRESSION: No active disease. Electronically Signed   By: Inez Catalina M.D.   On: 03/06/2015 18:15    EKG: Independently reviewed. Sinus tachycardia versus A flutter.  Will discuss with Cardiology.    Assessment/Plan 1. Tachycardia and history AFib:  This is new.  D-dimer neg.  TSH normal one week ago.  No leukocytosis or symptoms of infection (prostatitis symptoms improving on treatment.  TNI normal and stress test/echo both normal 2 months ago.  Lorazepam did not help.  Fluid resuscitation did not resolve tachycardia.  Suspect Aflutter.   CHADS-2Vasc 1.  WPW considered by Cardiology, doubted.  Of note, the patient reports talking to his primary Cardiologist about ablation at his last appointment. -Diltiazem 15 mg IV bolus once, then infusion -Admit to telemetry -Consultation with Cardiology, appreciate recommendations -Check free T4 -Continue apixaban and flecainide  2. Prostatitis:  This is new.  Started on cipro one week ago. -The QTc is normal at this time, but I will hold ciprofloxacin at least for today, until  evaluated by EP  3. Hypothyroidism:  -Continue levothyroxine -Check free T4  4. BPH:  -Continue finasteride  5. GERD:  Stable.  -Continue PPI  DVT PPx: Apixaban Diet: Cardiac Consultants: Cardiology Code Status: Full   Medical decision making: What exists of the patient's previous chart was reviewed in depth and the case was discussed with Dr. Wilson Singer and Dr. Claiborne Billings from Cardiology by phone. Patient seen 9:18 PM on 03/06/2015.  Disposition Plan:  Admit for observation. Diltiazem and consultation with Cardiology.      Edwin Dada [facility] Pager [phone]

## 2015-03-06 NOTE — ED Provider Notes (Signed)
I saw and evaluated the patient, reviewed the resident's note and I agree with the findings and plan.   EKG Interpretation   Date/Time:  Thursday March 06 2015 16:55:02 EST Ventricular Rate:  143 PR Interval:  136 QRS Duration: 94 QT Interval:  270 QTC Calculation: 416 R Axis:   107 Text Interpretation:  Sinus tachycardia Septal MI (old or age  indeterminate) Non-specific ST-t changes Confirmed by Wilson Singer  MD, Reniyah Gootee  ([id number]) on 03/06/2015 6:53:09 PM      64yM with palpitations. History of afib and he feels like his heart rate was irregular this morning. Currently sinus tachycardia. Resting HR 130-140 which I don't have a great explanation for at this point. Afebrile. Hx of ETOH abuse but says has been sober since 2013. He is not anemic. D dimer is normal. TSH 8 days ago was normal. Treated with IVF and also given a dose of ativan. Remains with HR in 130s-140s despite this. Admit for monitoring and further eval.  Virgel Manifold, MD 03/07/15 [phone]

## 2015-03-06 NOTE — ED Notes (Signed)
Pt arrives POV with c/o rapid HR since 7 am. Pt states he took additional flecanide 100mg  and 180 mg diltiazem.This usually works to slow rate but was not effective this am.denies chest pain, shortness dizziness, sweating.

## 2015-03-07 ENCOUNTER — Other Ambulatory Visit: Payer: Self-pay

## 2015-03-07 ENCOUNTER — Encounter (HOSPITAL_COMMUNITY): Payer: Self-pay | Admitting: General Practice

## 2015-03-07 DIAGNOSIS — I484 Atypical atrial flutter: Secondary | ICD-10-CM

## 2015-03-07 DIAGNOSIS — I10 Essential (primary) hypertension: Secondary | ICD-10-CM | POA: Diagnosis not present

## 2015-03-07 DIAGNOSIS — I4892 Unspecified atrial flutter: Secondary | ICD-10-CM | POA: Diagnosis not present

## 2015-03-07 DIAGNOSIS — I480 Paroxysmal atrial fibrillation: Secondary | ICD-10-CM | POA: Diagnosis not present

## 2015-03-07 DIAGNOSIS — R001 Bradycardia, unspecified: Secondary | ICD-10-CM | POA: Diagnosis not present

## 2015-03-07 DIAGNOSIS — N400 Enlarged prostate without lower urinary tract symptoms: Secondary | ICD-10-CM | POA: Diagnosis not present

## 2015-03-07 DIAGNOSIS — E038 Other specified hypothyroidism: Secondary | ICD-10-CM | POA: Diagnosis not present

## 2015-03-07 DIAGNOSIS — N419 Inflammatory disease of prostate, unspecified: Secondary | ICD-10-CM | POA: Diagnosis not present

## 2015-03-07 DIAGNOSIS — E039 Hypothyroidism, unspecified: Secondary | ICD-10-CM | POA: Diagnosis not present

## 2015-03-07 LAB — BASIC METABOLIC PANEL
ANION GAP: 7 (ref 5–15)
BUN: 6 mg/dL (ref 6–20)
CALCIUM: 8.8 mg/dL — AB (ref 8.9–10.3)
CHLORIDE: 109 mmol/L (ref 101–111)
CO2: 24 mmol/L (ref 22–32)
CREATININE: 0.84 mg/dL (ref 0.61–1.24)
GFR calc non Af Amer: >60 mL/min (ref >60)
Glucose, Bld: 97 mg/dL (ref 65–99)
Potassium: 3.4 mmol/L — ABNORMAL LOW (ref 3.5–5.1)
SODIUM: 140 mmol/L (ref 135–145)

## 2015-03-07 LAB — CBC
HCT: 40.3 % (ref 39.0–52.0)
Hemoglobin: 13.9 g/dL (ref 13.0–17.0)
MCH: 30.5 pg (ref 26.0–34.0)
MCHC: 34.5 g/dL (ref 30.0–36.0)
MCV: 88.6 fL (ref 78.0–100.0)
PLATELETS: 226 10*3/uL (ref 150–400)
RBC: 4.55 MIL/uL (ref 4.22–5.81)
RDW: 12.8 % (ref 11.5–15.5)
WBC: 6.6 10*3/uL (ref 4.0–10.5)

## 2015-03-07 LAB — TROPONIN I: Troponin I: <0.03 ng/mL (ref <0.031)

## 2015-03-07 LAB — MAGNESIUM: MAGNESIUM: 1.9 mg/dL (ref 1.7–2.4)

## 2015-03-07 MED ORDER — POTASSIUM CHLORIDE CRYS ER 20 MEQ PO TBCR
40.0000 meq | EXTENDED_RELEASE_TABLET | Freq: Once | ORAL | Status: AC
  Administered 2015-03-07: 40 meq via ORAL
  Filled 2015-03-07: qty 2

## 2015-03-07 MED ORDER — CIPROFLOXACIN HCL 500 MG PO TABS
500.0000 mg | ORAL_TABLET | Freq: Two times a day (BID) | ORAL | Status: DC
  Administered 2015-03-07: 500 mg via ORAL
  Filled 2015-03-07: qty 1

## 2015-03-07 MED ORDER — FLECAINIDE ACETATE 100 MG PO TABS: 100.0000 mg | ORAL_TABLET | Freq: Two times a day (BID) | ORAL | Status: DC

## 2015-03-07 MED ORDER — FLECAINIDE ACETATE 50 MG PO TABS: 100.0000 mg | ORAL_TABLET | Freq: Two times a day (BID) | ORAL | Status: DC

## 2015-03-07 MED ORDER — LEVOTHYROXINE SODIUM 25 MCG PO TABS: ORAL_TABLET | ORAL | Status: DC

## 2015-03-07 MED ORDER — DILTIAZEM HCL ER COATED BEADS 120 MG PO CP24
120.0000 mg | ORAL_CAPSULE | Freq: Every day | ORAL | Status: DC
  Administered 2015-03-07: 120 mg via ORAL
  Filled 2015-03-07: qty 1

## 2015-03-07 MED ORDER — ACYCLOVIR 400 MG PO TABS
400.0000 mg | ORAL_TABLET | Freq: Three times a day (TID) | ORAL | Status: DC
  Administered 2015-03-07: 400 mg via ORAL
  Filled 2015-03-07 (×3): qty 1

## 2015-03-07 NOTE — Discharge Summary (Signed)
Physician Discharge Summary  Roy Long MRN: [id number] DOB/AGE: [birth year] 64 y.o.  PCP: [name], MD   Admit date: 03/06/2015 Discharge date: 03/07/2015  Discharge Diagnoses:     Active Problems:   Atrial fibrillation (HCC)   Hypertension   Hypothyroidism   Prostatitis, acute   Tachycardia   Atrial flutter (HCC)    Follow-up recommendations Follow-up with PCP in 3-5 days , including all  additional recommended appointments as below Follow-up CBC, CMP in 3-5 days out patient visit with either Dr. Rayann Heman or Dr. [name] to discuss further.      Medication List    STOP taking these medications        amLODipine 5 MG tablet  Commonly known as:  NORVASC     sildenafil 100 MG tablet  Commonly known as:  VIAGRA      TAKE these medications        acetaminophen 325 MG tablet  Commonly known as:  TYLENOL  Take 325 mg by mouth every 6 (six) hours as needed for mild pain, moderate pain or headache.     acyclovir 400 MG tablet  Commonly known as:  ZOVIRAX  TAKE 1 TABLET THREE TIMES A DAY     apixaban 5 MG Tabs tablet  Commonly known as:  ELIQUIS  Take 1 tablet (5 mg total) by mouth 2 (two) times daily.     atorvastatin 20 MG tablet  Commonly known as:  LIPITOR  Take 1 tablet (20 mg total) by mouth daily.     ciprofloxacin 500 MG tablet  Commonly known as:  CIPRO  Take 1 tablet (500 mg total) by mouth 2 (two) times daily.     cycloSPORINE 0.05 % ophthalmic emulsion  Commonly known as:  RESTASIS  Place 1 drop into both eyes 2 (two) times daily.     diltiazem 180 MG 24 hr capsule  Commonly known as:  CARDIZEM CD  Take 1 capsule (180 mg total) by mouth daily.     finasteride 5 MG tablet  Commonly known as:  PROSCAR  TAKE 1 TABLET BY MOUTH DAILY     flecainide 100 MG tablet  Commonly known as:  TAMBOCOR  Take 1 tablet (100 mg total) by mouth 2 (two) times daily.     levothyroxine 25 MCG tablet  Commonly known as:  SYNTHROID, LEVOTHROID  TAKE 1  TABLET BY MOUTH EVERY DAY BEFORE BREAKFAST     LIPOFLAVONOID PO  Take 3 tablets by mouth daily at 12 noon.     omeprazole 20 MG capsule  Commonly known as:  PRILOSEC  Take 20 mg by mouth daily.     PRESERVISION AREDS PO  Take 1 mg by mouth 2 (two) times daily.     tamsulosin 0.4 MG Caps capsule  Commonly known as:  FLOMAX  Take 1 capsule (0.4 mg total) by mouth daily.         Discharge Condition: stable  Discharge Instructions       Discharge Instructions    Diet - low sodium heart healthy    Complete by:  As directed      Increase activity slowly    Complete by:  As directed            No Known Allergies    Disposition:    Consults: EP   Significant Diagnostic Studies:  Dg Chest 2 View  03/06/2015  CLINICAL DATA:  Tachycardia and atrial fibrillation for 1 day EXAM: CHEST - 2 VIEW COMPARISON:  04/30/2009 FINDINGS: Cardiac shadow is within normal limits. The lungs are well aerated bilaterally. Parenchymal scarring is again seen in the right apex stable from the prior exam. No focal infiltrate or sizable effusion is noted. No bony abnormality is seen. IMPRESSION: No active disease. Electronically Signed   By: Inez Catalina M.D.   On: 03/06/2015 18:15       Filed Weights   03/06/15 1704  Weight: 56.7 kg (125 lb)     Microbiology: No results found for this or any previous visit (from the past 240 hour(s)).     Blood Culture No results found for: SDES, [place], CULT, REPTSTATUS    Labs: Results for orders placed or performed during the hospital encounter of 03/06/15 (from the past 48 hour(s))  CBC with Differential     Status: None   Collection Time: 03/06/15  5:05 PM  Result Value Ref Range   WBC 8.1 4.0 - 10.5 K/uL   RBC 4.91 4.22 - 5.81 MIL/uL   Hemoglobin 15.1 13.0 - 17.0 g/dL   HCT 43.7 39.0 - 52.0 %   MCV 89.0 78.0 - 100.0 fL   MCH 30.8 26.0 - 34.0 pg   MCHC 34.6 30.0 - 36.0 g/dL   RDW 12.6 11.5 - 15.5 %   Platelets 251 150 - 400  K/uL   Neutrophils Relative % 50 %   Neutro Abs 4.1 1.7 - 7.7 K/uL   Lymphocytes Relative 41 %   Lymphs Abs 3.3 0.7 - 4.0 K/uL   Monocytes Relative 7 %   Monocytes Absolute 0.5 0.1 - 1.0 K/uL   Eosinophils Relative 2 %   Eosinophils Absolute 0.2 0.0 - 0.7 K/uL   Basophils Relative 0 %   Basophils Absolute 0.0 0.0 - 0.1 K/uL  Basic metabolic panel     Status: Abnormal   Collection Time: 03/06/15  5:05 PM  Result Value Ref Range   Sodium 142 135 - 145 mmol/L   Potassium 3.6 3.5 - 5.1 mmol/L   Chloride 108 101 - 111 mmol/L   CO2 23 22 - 32 mmol/L   Glucose, Bld 144 (H) 65 - 99 mg/dL   BUN 8 6 - 20 mg/dL   Creatinine, Ser 1.09 0.61 - 1.24 mg/dL   Calcium 9.1 8.9 - 10.3 mg/dL   GFR calc non Af Amer >60 >60 mL/min   GFR calc Af Amer >60 >60 mL/min    Comment: (NOTE) The eGFR has been calculated using the CKD EPI equation. This calculation has not been validated in all clinical situations. eGFR's persistently <60 mL/min signify possible Chronic Kidney Disease.    Anion gap 11 5 - 15  Troponin I     Status: None   Collection Time: 03/06/15  5:05 PM  Result Value Ref Range   Troponin I <0.03 <0.031 ng/mL    Comment:        NO INDICATION OF MYOCARDIAL INJURY.   D-dimer, quantitative (not at [facility])     Status: None   Collection Time: 03/06/15  5:05 PM  Result Value Ref Range   D-Dimer, Quant <0.27 0.00 - 0.50 ug/mL-FEU    Comment: (NOTE) At the manufacturer cut-off of 0.50 ug/mL FEU, this assay has been documented to exclude PE with a sensitivity and negative predictive value of 97 to 99%.  At this time, this assay has not been approved by the FDA to exclude DVT/VTE. Results should be correlated with clinical presentation.   I-Stat Troponin, ED (not at [facility])  Status: None   Collection Time: 03/06/15  5:21 PM  Result Value Ref Range   Troponin i, poc 0.01 0.00 - 0.08 ng/mL   Comment 3            Comment: Due to the release kinetics of cTnI, a negative result within the  first hours of the onset of symptoms does not rule out myocardial infarction with certainty. If myocardial infarction is still suspected, repeat the test at appropriate intervals.   T4, free     Status: None   Collection Time: 03/06/15  9:33 PM  Result Value Ref Range   Free T4 0.87 0.61 - 1.12 ng/dL  Magnesium     Status: None   Collection Time: 03/07/15 12:15 AM  Result Value Ref Range   Magnesium 1.9 1.7 - 2.4 mg/dL  Troponin I     Status: None   Collection Time: 03/07/15 12:15 AM  Result Value Ref Range   Troponin I <0.03 <0.031 ng/mL    Comment:        NO INDICATION OF MYOCARDIAL INJURY.   Basic metabolic panel     Status: Abnormal   Collection Time: 03/07/15  6:03 AM  Result Value Ref Range   Sodium 140 135 - 145 mmol/L   Potassium 3.4 (L) 3.5 - 5.1 mmol/L   Chloride 109 101 - 111 mmol/L   CO2 24 22 - 32 mmol/L   Glucose, Bld 97 65 - 99 mg/dL   BUN 6 6 - 20 mg/dL   Creatinine, Ser 0.84 0.61 - 1.24 mg/dL   Calcium 8.8 (L) 8.9 - 10.3 mg/dL   GFR calc non Af Amer >60 >60 mL/min   GFR calc Af Amer >60 >60 mL/min    Comment: (NOTE) The eGFR has been calculated using the CKD EPI equation. This calculation has not been validated in all clinical situations. eGFR's persistently <60 mL/min signify possible Chronic Kidney Disease.    Anion gap 7 5 - 15  CBC     Status: None   Collection Time: 03/07/15  6:03 AM  Result Value Ref Range   WBC 6.6 4.0 - 10.5 K/uL   RBC 4.55 4.22 - 5.81 MIL/uL   Hemoglobin 13.9 13.0 - 17.0 g/dL   HCT 40.3 39.0 - 52.0 %   MCV 88.6 78.0 - 100.0 fL   MCH 30.5 26.0 - 34.0 pg   MCHC 34.5 30.0 - 36.0 g/dL   RDW 12.8 11.5 - 15.5 %   Platelets 226 150 - 400 K/uL  Troponin I     Status: None   Collection Time: 03/07/15  6:03 AM  Result Value Ref Range   Troponin I <0.03 <0.031 ng/mL    Comment:        NO INDICATION OF MYOCARDIAL INJURY.      Lipid Panel     Component Value Date/Time   CHOL 193 02/26/2015 0925   TRIG 117.0 02/26/2015  0925   HDL 36.30* 02/26/2015 0925   CHOLHDL 5 02/26/2015 0925   VLDL 23.4 02/26/2015 0925   LDLCALC 133* 02/26/2015 0925     Lab Results  Component Value Date   HGBA1C 5.1 04/15/2011     Lab Results  Component Value Date   LDLCALC 133* 02/26/2015   CREATININE 0.84 03/07/2015     HPI :  MILLION MAHARAJ is a 64 y.o. male with PMHx of PAFib on Eliquis and flecainide and diltiazem at home, HTN, HLD, hypothyroidism and hx of ETOH abuse (none since  2013), was admitted to [facility] 03/06/15 with c/o palpitations noting AFlutter 2:1 conduction.  The pt states that his PAF goes back to the early 2000's, states over the years he has heard both Afib and Aflutter being told to him, underwent DCCV about 2003, since then, he has had about 1-2 episode of PAF every 31month, controlled with a "pill in the pocket" strategy. He was on for some years both Flecainide daily and cardizem, though in the last couple years only using the cardizem for paroxysms of his AFib as well as an additional dose of his flecainide, usually resolving the episode withing about 4 hours. He states he became unable to use the cardizem daily unable to get his HR up at the gym. This year he has had multiple episodes, in the last 3 months alone about 4-5 times, when he last visited with Dr. JMartiniquehe reports there was some discussion about investigating onto ablation option for him.  Yesterday he felt himself go into AF in the morning, he took his cardizem and extra flecainide as usual, though by the late afternoon still in AF and called his cardiologist's office and was instructed to come to the ER. He states historically he was quite symptomatic, though in the last few years, he can feel the AFib/palpitations, feels like he has less energy with it, but otherwise no SOB, no dizziness, near syncope or syncope.  Today he feels well, he converted to SR once up to the floow just prior to them starting a cardizem gtt. He denies any kind  of CP today or otherwise   HOSPITAL COURSE:   1. Tachycardia Atrial flutter 2:1 This is new. D-dimer neg. TSH normal one week ago. No leukocytosis or symptoms of infection (prostatitis symptoms improving on treatment. TNI normal and stress test/echo both normal 2 months ago. Suspect Aflutter. CHADS-2Vasc of 1 He has had increasingly frequent episodes on flecainide; he is on low-dose flecainide.Cardiology  discussed a variety of treatment options including a) increase flecainide B) change to a different 1 c C) use dofetilide D) consider catheter ablation.  Given his young age and his left atrial size being normal in September, Dr KCaryl Comesinclined towards catheter ablation as a proximate strategy. He is agreeable and we will arrange consultation with WC or JA.    his flecainide was increased from 50-->100 mg BID and because of the sodium channel blocking properties cont  low-dose calcium blocker therapy diltiazem 180  He will continue on his NOAC.  In the event that he has recurrent atrial flutter,   in the absence of significant symptoms, he keep himself nothing by mouth on the first morning of persistent atrial arrhythmia and call the office and Dr KCaryl Comeswould work on trying to accomplish cardioversion  -Continue apixaban and flecainide DC norvasc   2. Prostatitis:  This is new. Started on cipro/acyclovir one week ago. -The QTc is normal at this time, but I will hold ciprofloxacin at least for today, until evaluated by EP  3. Hypothyroidism:  -Continue levothyroxine Normal TSH and free T4  4. BPH:  -Continue finasteride  5. GERD:  Stable.  -Continue PPI  Discharge Exam:  Blood pressure 129/57, pulse 68, temperature 98.1 F (36.7 C), temperature source Oral, resp. rate 18, height '5\' 8"'  (1.727 m), weight 56.7 kg (125 lb), SpO2 100 %.  General: Pleasant, NAD. Neuro: Alert and oriented X 3. Moves all extremities spontaneously. Psych: Normal affect. HEENT:  Normal Neck: Supple without bruits or JVD. Lungs: Resp  regular and unlabored, CTA. Heart: RRR no s3, s4, or murmurs. Abdomen: Soft, non-tender, non-distended, BS + x 4.  Extremities: No clubbing, cyanosis or edema. DP/PT/Radials 2+ and equal bilaterally.    Follow-up Information    Follow up with [name], MD. Schedule an appointment as soon as possible for a visit in 1 week.   Specialty:  Internal Medicine   Contact information:   [address] [address] [phone]       Follow up with Thompson Grayer, MD. Schedule an appointment as soon as possible for a visit in 3 days.   Specialty:  Cardiology   Contact information:   [address] [place] [address] [phone]       Signed: Reyne Dumas 03/07/2015, 6:02 PM        Time spent >45 mins

## 2015-03-07 NOTE — Progress Notes (Signed)
Pt being discharged home via wheelchair with family. Pt alert and oriented x4. VSS. Pt c/o no pain at this time. No signs of respiratory distress. Education complete and care plans resolved. IV removed with catheter intact and pt tolerated well. No further issues at this time. Pt to follow up with PCP. Siarra Gilkerson R, RN 

## 2015-03-07 NOTE — Progress Notes (Signed)
Patient Name: Roy Long Date of Encounter: 03/07/2015  Mr. Roy Long is a 64 yo man with PMH of paroxysmal atrial fibrillation on flecainide for 11 years, hypertension, hypothyroidism, GERD, previous alcohol abuse in AA abstinence dating to 2013 last seen by Dr. Martinique 12/05/14 who presented yesterday with tachycardia/palpitations.    SUBJECTIVE  Feeling well. No chest pain, sob or palpitations.   CURRENT MEDS . amLODipine  5 mg Oral Daily  . apixaban  5 mg Oral BID  . atorvastatin  20 mg Oral q1800  . cycloSPORINE  1 drop Both Eyes BID  . diltiazem  15 mg Intravenous Once  . finasteride  5 mg Oral Daily  . flecainide  50 mg Oral BID  . levothyroxine  25 mcg Oral QAC breakfast  . pantoprazole  40 mg Oral Daily  . tamsulosin  0.4 mg Oral Daily    OBJECTIVE  Filed Vitals:   03/06/15 2130 03/06/15 2230 03/06/15 2333 03/07/15 0454  BP: 129/85 135/87 133/63 117/69  Pulse: 137 142 95 84  Temp:   97.8 F (36.6 C) 97.8 F (36.6 C)  TempSrc:   Oral Oral  Resp: 19  16 17   Height:      Weight:      SpO2: 97% 99% 100% 98%    Intake/Output Summary (Last 24 hours) at 03/07/15 1229 Last data filed at 03/06/15 1858  Gross per 24 hour  Intake      0 ml  Output    300 ml  Net   -300 ml   Filed Weights   03/06/15 1704  Weight: 125 lb (56.7 kg)    PHYSICAL EXAM  General: Pleasant, NAD. Neuro: Alert and oriented X 3. Moves all extremities spontaneously. Psych: Normal affect. HEENT:  Normal  Neck: Supple without bruits or JVD. Lungs:  Resp regular and unlabored, CTA. Heart: RRR no s3, s4, or murmurs. Abdomen: Soft, non-tender, non-distended, BS + x 4.  Extremities: No clubbing, cyanosis or edema. DP/PT/Radials 2+ and equal bilaterally.  Accessory Clinical Findings  CBC  Recent Labs  03/06/15 1705 03/07/15 0603  WBC 8.1 6.6  NEUTROABS 4.1  --   HGB 15.1 13.9  HCT 43.7 40.3  MCV 89.0 88.6  PLT 251 [id number]   Basic Metabolic Panel  Recent Labs  03/06/15 1705  03/07/15 0015 03/07/15 0603  NA 142  --  140  K 3.6  --  3.4*  CL 108  --  109  CO2 23  --  24  GLUCOSE 144*  --  97  BUN 8  --  6  CREATININE 1.09  --  0.84  CALCIUM 9.1  --  8.8*  MG  --  1.9  --    Cardiac Enzymes  Recent Labs  03/06/15 1705 03/07/15 0015 03/07/15 0603  TROPONINI <0.03 <0.03 <0.03   BNP Invalid input(s): POCBNP D-Dimer  Recent Labs  03/06/15 1705  DDIMER <0.27    TELE  Sinus rhythm with rate mostly in 70s. Transiently above 100s  Radiology/Studies  Dg Chest 2 View  03/06/2015  CLINICAL DATA:  Tachycardia and atrial fibrillation for 1 day EXAM: CHEST - 2 VIEW COMPARISON:  04/30/2009 FINDINGS: Cardiac shadow is within normal limits. The lungs are well aerated bilaterally. Parenchymal scarring is again seen in the right apex stable from the prior exam. No focal infiltrate or sizable effusion is noted. No bony abnormality is seen. IMPRESSION: No active disease. Electronically Signed   By: Roy Long M.D.   On:  03/06/2015 18:15    ASSESSMENT AND PLAN  1. PAF - On flecainide for 11 years. Patient usually gets 1-2 episode of afib every 6 months that terminated with extra dose of flecainide with Cardizem. During last office visit with Dr. Martinique 12/05/14 the patient has reported 4 episode in the preceding 6 months. Since then patient had 2nd episode yesterday that did not terminated with extra dose of flecainide and cardizem.  - This clearly seems more frequent and worse.  Currently in sinus rhythm. Seems he was in sinus even before started cardizem, now off.  - Continue cardizem CD 180mg  as neededd during tachycardia. Continue flecainide. Eliquis for anticoagulation.  - D-dimer negative. TSH normal.  - EP eval for possible ablation or different antiarrhythmic (never been on other). If yes, inpatient vs outpatient as patient feeling well.   2. HTN - Stable. Continue Norvasc 5mg .   3. Hypokalemia - Supplement given     Signed, Leanor Kail  PA-C Pager [phone]

## 2015-03-07 NOTE — Consult Note (Signed)
ELECTROPHYSIOLOGY CONSULT NOTE    Patient ID: Roy Long MRN: [id number], DOB/AGE: 1950/04/05 64 y.o.  Admit date: 03/06/2015 Date of Consult: 03/07/2015   Primary Physician: [name], MD Primary Cardiologist: Dr. Martinique  Reason for Consultation: PAFib  HPI: Roy Long is a 64 y.o. male with PMHx of PAFib on Eliquis and flecainide  and diltiazem at home, HTN, HLD, hypothyroidism and hx of ETOH abuse (none since 2013), was admitted to [facility] 03/06/15 with c/o palpitations noting AFlutter 2:1 conduction.  The pt states that his PAF goes back to the early 2000's, states over the years he has heard both Afib and Aflutter being told to him, underwent DCCV about 2003, since then, he has had about 1-2 episode of PAF every 31months, controlled with a "pill in the pocket" strategy.  He was on for some years both Flecainide daily and cardizem, though in the last couple years only using the cardizem for paroxysms of his AFib as well as an additional dose of his flecainide, usually resolving the episode withing about 4 hours.  He states he became unable to use the cardizem daily unable to get his HR up at the gym.   This year he has had multiple episodes, in the last 3 months alone about 4-5 times, when he last visited with Dr. Martinique he reports there was some discussion about investigating onto ablation option for him.  Yesterday he felt himself go into AF in the morning, he took his cardizem and extra flecainide as usual, though by the late afternoon still in AF and called his cardiologist's office and was instructed to come to the ER.  He states historically he was quite symptomatic, though in the last few years, he can feel the AFib/palpitations, feels like he has less energy with it, but otherwise no SOB, no dizziness, near syncope or syncope.  Today he feels well, he converted to SR once up to the floow just prior to them starting a cardizem gtt.  He denies any kind of CP today or otherwise.    Past Medical History  Diagnosis Date  . Atrial fibrillation ([place])   . Hypertension   . Alcohol dependence ([place])   . GERD (gastroesophageal reflux disease)   . Age-related macular degeneration, dry, left eye   . Hypercholesterolemia dx'd 02/2015  . Squamous carcinoma (H[facility])     hand, chest  . Basal cell carcinoma   . Kidney stones   . Family history of adverse reaction to anesthesia     "father had head injuries and dementia; everytime he was put under less of his mentation came back"  . Hypothyroidism   . Pneumonia ~ 2004  . Migraine aura without headache     "very rare now" (03/07/2015)  . Arthritis     "maybe a little bit in my left knee" (03/07/2015)  . Depression     hx     Surgical History:  Past Surgical History  Procedure Laterality Date  . Anterior cervical decomp/discectomy fusion  04/2009  . Back surgery    . Inguinal hernia repair Bilateral 2004  . Cardioversion  06/2002  . Colonoscopy w/ polypectomy  02/2003  . Colonoscopy    . Cataract extraction w/ intraocular lens implant Left ~ 2012  . Mohs surgery Right     "temple"  . Cystoscopy w/ stone manipulation  1980's    "basketted it out; no stent"     Prescriptions prior to admission  Medication Sig Dispense Refill Last  Dose  . acetaminophen (TYLENOL) 325 MG tablet Take 325 mg by mouth every 6 (six) hours as needed for mild pain, moderate pain or headache.   over 30 days  . acyclovir (ZOVIRAX) 400 MG tablet TAKE 1 TABLET THREE TIMES A DAY (Patient taking differently: TAKE 400 MG BY MOUTH DAILY) 90 tablet 3 03/06/2015 at Unknown time  . amLODipine (NORVASC) 5 MG tablet TAKE 1 TABLET (5 MG TOTAL) BY MOUTH DAILY. 90 tablet 1 03/06/2015 at Unknown time  . apixaban (ELIQUIS) 5 MG TABS tablet Take 1 tablet (5 mg total) by mouth 2 (two) times daily. 180 tablet 3 03/06/2015 at 700  . atorvastatin (LIPITOR) 20 MG tablet Take 1 tablet (20 mg total) by mouth daily. (Patient taking differently: Take 20 mg by mouth daily at 6  PM. ) 90 tablet 3 03/05/2015 at Unknown time  . ciprofloxacin (CIPRO) 500 MG tablet Take 1 tablet (500 mg total) by mouth 2 (two) times daily. 60 tablet 1 03/06/2015 at am  . cycloSPORINE (RESTASIS) 0.05 % ophthalmic emulsion Place 1 drop into both eyes 2 (two) times daily.   03/06/2015 at Unknown time  . diltiazem (CARDIZEM CD) 180 MG 24 hr capsule Take 1 capsule (180 mg total) by mouth daily. (Patient taking differently: Take 180 mg by mouth as needed (tachycardia). ) 30 capsule 3 03/06/2015 at Unknown time  . finasteride (PROSCAR) 5 MG tablet TAKE 1 TABLET BY MOUTH DAILY (Patient taking differently: Take 5 mg by mouth daily. ) 90 tablet 3 03/06/2015 at Unknown time  . flecainide (TAMBOCOR) 50 MG tablet Take 1 tablet (50 mg total) by mouth 2 (two) times daily. 180 tablet 3 03/06/2015 at Unknown time  . Multiple Vitamins-Minerals (PRESERVISION AREDS PO) Take 1 mg by mouth 2 (two) times daily.    03/06/2015 at Unknown time  . omeprazole (PRILOSEC) 20 MG capsule Take 20 mg by mouth daily.   03/06/2015 at Unknown time  . sildenafil (VIAGRA) 100 MG tablet Take 0.5-1 tablets (50-100 mg total) by mouth daily as needed for erectile dysfunction. 10 tablet 11 over 30 days  . tamsulosin (FLOMAX) 0.4 MG CAPS capsule Take 1 capsule (0.4 mg total) by mouth daily. 90 capsule 3 03/05/2015 at Unknown time  . Vitamins-Lipotropics (LIPOFLAVONOID PO) Take 3 tablets by mouth daily at 12 noon.    03/06/2015 at Unknown time    Inpatient Medications:  . acyclovir  400 mg Oral TID  . amLODipine  5 mg Oral Daily  . apixaban  5 mg Oral BID  . atorvastatin  20 mg Oral q1800  . ciprofloxacin  500 mg Oral BID  . cycloSPORINE  1 drop Both Eyes BID  . diltiazem  15 mg Intravenous Once  . finasteride  5 mg Oral Daily  . flecainide  50 mg Oral BID  . levothyroxine  25 mcg Oral QAC breakfast  . pantoprazole  40 mg Oral Daily  . tamsulosin  0.4 mg Oral Daily    Allergies: No Known Allergies  Social History   Social History  .  Marital Status: Single    Spouse Name: N/A  . Number of Children: 0  . Years of Education: N/A   Occupational History  . band teacher     retired   Social History Main Topics  . Smoking status: Former Smoker -- 3.00 packs/day for 17 years    Types: Cigarettes    Quit date: 03/30/1983  . Smokeless tobacco: Never Used  . Alcohol Use: Yes  Comment: 03/07/2015 "recovering alcoholic; dry date [id number]"  . Drug Use: Yes    Special: Marijuana, LSD, Mescaline     Comment: 03/07/2015 "stopped all drugs back in the 1980's"  . Sexual Activity: Yes    Birth Control/ Protection: None   Other Topics Concern  . Not on file   Social History Narrative     Family History  Problem Relation Age of Onset  . Heart failure Mother   . Stroke Brother     X2  . Cancer Brother   . Heart disease Neg Hx   . Hyperlipidemia Neg Hx   . Hypertension Neg Hx   . Diabetes Neg Hx      Review of Systems: All other systems reviewed and are otherwise negative except as noted above.  Physical Exam: Filed Vitals:   03/06/15 2130 03/06/15 2230 03/06/15 2333 03/07/15 0454  BP: 129/85 135/87 133/63 117/69  Pulse: 137 142 95 84  Temp:   97.8 F (36.6 C) 97.8 F (36.6 C)  TempSrc:   Oral Oral  Resp: 19  16 17   Height:      Weight:      SpO2: 97% 99% 100% 98%    GEN- The patient is well appearing, alert and oriented x 3 today.   HEENT: normocephalic, atraumatic; sclera clear, conjunctiva pink; hearing intact; oropharynx clear; neck supple, no JVP Lungs- Clear to ausculation bilaterally, normal work of breathing.  No wheezes, rales, rhonchi Heart- Regular rate and rhythm, no murmurs, rubs or gallops, PMI not laterally displaced GI- soft, non-tender, non-distended, bowel sounds present Extremities- no clubbing, cyanosis, or edema MS- no significant deformity or atrophy Skin- warm and dry, no rash or lesion Psych- euthymic mood, full affect Neuro- no gross deficits observed  Labs:   Lab Results   Component Value Date   WBC 6.6 03/07/2015   HGB 13.9 03/07/2015   HCT 40.3 03/07/2015   MCV 88.6 03/07/2015   PLT 226 03/07/2015    Recent Labs Lab 03/07/15 0603  NA 140  K 3.4*  CL 109  CO2 24  BUN 6  CREATININE 0.84  CALCIUM 8.8*  GLUCOSE 97      Radiology/Studies:  Dg Chest 2 View 03/06/2015  CLINICAL DATA:  Tachycardia and atrial fibrillation for 1 day EXAM: CHEST - 2 VIEW COMPARISON:  04/30/2009 FINDINGS: Cardiac shadow is within normal limits. The lungs are well aerated bilaterally. Parenchymal scarring is again seen in the right apex stable from the prior exam. No focal infiltrate or sizable effusion is noted. No bony abnormality is seen. IMPRESSION: No active disease. Electronically Signed   By: Inez Catalina M.D.   On: 03/06/2015 18:15    EKG: #1  Underlying rhythm is difficult, tachycardia 143 bpm #2 SR, Q V1-2  TELEMETRY: currently is in SR   12/12/14: Echocardiogram Study Conclusions - Left ventricle: The cavity size was normal. Systolic function was normal. The estimated ejection fraction was in the range of 60% to 65%. Wall motion was normal; there were no regional wall motion abnormalities. Doppler parameters are consistent with abnormal left ventricular relaxation (grade 1 diastolic dysfunction). - Mitral valve: Mildly thickened leaflets . LA 11mm  12/12/14 stress myoview Study Highlights     The left ventricular ejection fraction is normal (55-65%).  Nuclear stress EF: 64%.  No T wave inversion was noted during stress.  There was no ST segment deviation noted during stress.  This is a low risk study.  Normal perfusion. LVEF 64% with normal  wall motion. Excellent exercise tolerance. This is a low risk study      Assessment and Plan:   1. PAfib/flutter     CHADS2Vasc is at least 1, on Eliquis     He has been on Flecaine for about 11 years, with history of AFib episodes as described above     Lately increasing in frequency and  duration     Currently in SR     He would like to look into other rhythm management options, particularly ablation.  We could certainly arrange a out patient visit with either Dr. Rayann Heman or Dr. [name] to discuss further.   Further pending Dr. Caryl Comes  2. HTN  3. Hypothyroidism     TSH, free T4 are wnl    Signed, Tommye Standard, PA-C 03/07/2015 1:23 PM   The patient seen interviewed and examined.  Note amended as needed   The patient has a history of recurrent atrial arrhythmias including a diagnosis of atrial fibrillation although the only electrical tracings that we have our for atypical atrial flutter. His symptoms with his flutter are modest. Sometimes there has been lightheadedness shortness of breath or fatigue; on this occasion the prolonged nature of the tachycardia prompted his admission.  He has had increasingly frequent episodes on flecainide; he is on low-dose flecainide. We discussed a variety of treatment options including a) increase flecainide B) change to a different 1 c C) use dofetilide D) consider catheter ablation.  Given his young age and his left atrial size being normal in September, I would be inclined towards catheter ablation as a proximate strategy. He is agreeable and we will arrange consultation with WC or JA.  In the interim,we will incerease his flecainide 50--100 and because of the sodium channel blocking properties we will add low-dose calcium blocker therapy diltiazem 120.  He will continue on his NOAC.  In the event that he has recurrent atrial flutter, I have suggested that in the absence of significant symptoms, he keep himself nothing by mouth on the first morning persistent atrial arrhythmia and call the office and we would work on trying to accomplish cardioversion  He can go home

## 2015-03-07 NOTE — Care Management Obs Status (Signed)
[facility] NOTIFICATION   Patient Details  Name: BRODEY GILLOGLY MRN: [id number] Date of Birth: 1950-10-22   Medicare Observation Status Notification Given:  Yes    Dawayne Patricia, RN 03/07/2015, 3:15 PM

## 2015-03-07 NOTE — Progress Notes (Addendum)
[facility] PROGRESS NOTE  Roy Long [id number] DOB: 1950-12-27 DOA: 03/06/2015 PCP: [name], MD  Length of stay:    Assessment/Plan: Active Problems:   Atrial fibrillation ([place])   Hypertension   Hypothyroidism   Prostatitis, acute   Tachycardia    Brief summary 64 y.o. male with a past medical history significant for pAF on flecainide, HTN, former alcohol use, hypothyroidism, and recent prostatitis who presents with palpitations for one day.  The patient woke up this morning feeling like "my heart was out of rhythm, and fast". Assumed it was his pAF, so he took an extra dose of flecainide and his diltiazem, which is what he normally does when this happens (used to happen about 4 times per year, and the last 9 months has been happening about once per month). The symptoms didn't resolve, so he went to ER.  In the ED, the patient's ECG appeared to show sinus tachycardia. He had no leukocytosis or focal s/s of infection. He received 2L NS and his heart rate did not improve with 2 hours lying in bed. He had a normal d-dimer, negative initial troponin and no chest discomfort. He was given lorazepam and had no improvement in HR. He denies all alcohol since 0000000 and no illicit drugs. He did start ciprofloxacin for prostatitis 1 week ago (has three weeks left) and also atorvastatin for hyperlipidemia this week.  TR[facility] were asked to admit for persistent tachycardia.   Assessment and plan  1. Tachycardia Atrial flutter 2:1 This is new. D-dimer neg. TSH normal one week ago. No leukocytosis or symptoms of infection (prostatitis symptoms improving on treatment. TNI normal and stress test/echo both normal 2 months ago.   Suspect Aflutter. CHADS-2Vasc 1. WPW considered by Cardiology, doubted. Of note, the patient reports talking to his primary Cardiologist about ablation . Dr. Claiborne Billings saw the patient this morning and recommends EP consult  -received Diltiazem 15  mg IV bolus , currently on flecainide I had to personally call EP to notify them about this patient -Continue apixaban and flecainide No other active medical issues, okay to transfer to cardiology service if  they agree   2. Prostatitis:  This is new. Started on cipro/acyclovir one week ago. -The QTc is normal at this time, but I will hold ciprofloxacin at least for today, until evaluated by EP  3. Hypothyroidism:  -Continue levothyroxine Normal TSH and free T4  4. BPH:  -Continue finasteride  5. GERD:  Stable.  -Continue PPI   DVT prophylaxsis eliquis  Code Status:      Code Status Orders        Start     Ordered   03/06/15 2326  Full code   Continuous     03/06/15 2326    Advance Directive Documentation        Most Recent Value   Type of Advance Directive  Healthcare Power of [place], Living will [dated 05/01/2009]   Pre-existing out of facility DNR order (yellow form or pink MOST form)     "MOST" Form in Place?        Family Communication: Discussed in detail with the patient, all imaging results, lab results explained to the patient   Disposition Plan:  Hopefully transfer to cardiology for further management      Consultants:  cardiology  Procedures:  none  Antibiotics: Anti-infectives    None         HPI/Subjective: Sitting comfortably in the chair, no  chest pain or shortness of breath  Objective: Filed Vitals:   03/06/15 2130 03/06/15 2230 03/06/15 2333 03/07/15 0454  BP: 129/85 135/87 133/63 117/69  Pulse: 137 142 95 84  Temp:   97.8 F (36.6 C) 97.8 F (36.6 C)  TempSrc:   Oral Oral  Resp: 19  16 17   Height:      Weight:      SpO2: 97% 99% 100% 98%    Intake/Output Summary (Last 24 hours) at 03/07/15 1229 Last data filed at 03/06/15 1858  Gross per 24 hour  Intake      0 ml  Output    300 ml  Net   -300 ml    Exam:  General: No acute respiratory distress Lungs: Clear to auscultation bilaterally without wheezes  or crackles Cardiovascular: Regular rate and rhythm without murmur gallop or rub normal S1 and S2 Abdomen: Nontender, nondistended, soft, bowel sounds positive, no rebound, no ascites, no appreciable mass Extremities: No significant cyanosis, clubbing, or edema bilateral lower extremities     Data Review   Micro Results No results found for this or any previous visit (from the past 240 hour(s)).  Radiology Reports Dg Chest 2 View  03/06/2015  CLINICAL DATA:  Tachycardia and atrial fibrillation for 1 day EXAM: CHEST - 2 VIEW COMPARISON:  04/30/2009 FINDINGS: Cardiac shadow is within normal limits. The lungs are well aerated bilaterally. Parenchymal scarring is again seen in the right apex stable from the prior exam. No focal infiltrate or sizable effusion is noted. No bony abnormality is seen. IMPRESSION: No active disease. Electronically Signed   By: Inez Catalina M.D.   On: 03/06/2015 18:15     CBC  Recent Labs Lab 03/06/15 1705 03/07/15 0603  WBC 8.1 6.6  HGB 15.1 13.9  HCT 43.7 40.3  PLT 251 226  MCV 89.0 88.6  MCH 30.8 30.5  MCHC 34.6 34.5  RDW 12.6 12.8  LYMPHSABS 3.3  --   MONOABS 0.5  --   EOSABS 0.2  --   BASOSABS 0.0  --     Chemistries   Recent Labs Lab 03/06/15 1705 03/07/15 0015 03/07/15 0603  NA 142  --  140  K 3.6  --  3.4*  CL 108  --  109  CO2 23  --  24  GLUCOSE 144*  --  97  BUN 8  --  6  CREATININE 1.09  --  0.84  CALCIUM 9.1  --  8.8*  MG  --  1.9  --    ------------------------------------------------------------------------------------------------------------------ estimated creatinine clearance is 69.4 mL/min (by C-G formula based on Cr of 0.84). ------------------------------------------------------------------------------------------------------------------ No results for input(s): HGBA1C in the last 72 hours. ------------------------------------------------------------------------------------------------------------------ No results  for input(s): CHOL, HDL, LDLCALC, TRIG, CHOLHDL, LDLDIRECT in the last 72 hours. ------------------------------------------------------------------------------------------------------------------ No results for input(s): TSH, T4TOTAL, T3FREE, THYROIDAB in the last 72 hours.  Invalid input(s): FREET3 ------------------------------------------------------------------------------------------------------------------ No results for input(s): VITAMINB12, FOLATE, FERRITIN, TIBC, IRON, RETICCTPCT in the last 72 hours.  Coagulation profile No results for input(s): INR, PROTIME in the last 168 hours.   Recent Labs  03/06/15 1705  DDIMER <0.27    Cardiac Enzymes  Recent Labs Lab 03/06/15 1705 03/07/15 0015 03/07/15 0603  TROPONINI <0.03 <0.03 <0.03   ------------------------------------------------------------------------------------------------------------------ Invalid input(s): POCBNP   CBG: No results for input(s): GLUCAP in the last 168 hours.     Studies: Dg Chest 2 View  03/06/2015  CLINICAL DATA:  Tachycardia and atrial fibrillation for 1 day EXAM:  CHEST - 2 VIEW COMPARISON:  04/30/2009 FINDINGS: Cardiac shadow is within normal limits. The lungs are well aerated bilaterally. Parenchymal scarring is again seen in the right apex stable from the prior exam. No focal infiltrate or sizable effusion is noted. No bony abnormality is seen. IMPRESSION: No active disease. Electronically Signed   By: Inez Catalina M.D.   On: 03/06/2015 18:15      Lab Results  Component Value Date   HGBA1C 5.1 04/15/2011   Lab Results  Component Value Date   LDLCALC 133* 02/26/2015   CREATININE 0.84 03/07/2015       Scheduled Meds: . amLODipine  5 mg Oral Daily  . apixaban  5 mg Oral BID  . atorvastatin  20 mg Oral q1800  . cycloSPORINE  1 drop Both Eyes BID  . diltiazem  15 mg Intravenous Once  . finasteride  5 mg Oral Daily  . flecainide  50 mg Oral BID  . levothyroxine  25 mcg Oral  QAC breakfast  . pantoprazole  40 mg Oral Daily  . tamsulosin  0.4 mg Oral Daily   Continuous Infusions:   Active Problems:   Atrial fibrillation ([facility])   Hypertension   Hypothyroidism   Prostatitis, acute   Tachycardia    Time spent: 45 minutes   [facility] Pager [phone]. If 7PM-7AM, please contact night-coverage at [email or web address], password [facility] 03/07/2015, 12:29 PM

## 2015-03-07 NOTE — Progress Notes (Addendum)
RN going to administer cardizem bolus/gtt and noticed pt was in SR. RN held meds until PA was reached. Pt did not feel conversion, but stated he felt his pulse was much lower than before. EKG obtained and SR in the 70s was confirmed. Notified [place], [place].   Will continue to monitor.   Earlie Lou

## 2015-03-07 NOTE — Progress Notes (Signed)
[place], [place] ordered to start cardizem gtt at 9ml/hour and not give the bolus of cardizem. Pt VSS and pt heart rhythm is SR.   Will continue to monitor.   Earlie Lou

## 2015-03-13 ENCOUNTER — Encounter: Payer: Self-pay | Admitting: Cardiology

## 2015-03-13 ENCOUNTER — Ambulatory Visit (INDEPENDENT_AMBULATORY_CARE_PROVIDER_SITE_OTHER): Payer: Medicare Other | Admitting: Cardiology

## 2015-03-13 VITALS — BP 140/72 | HR 76 | Ht 68.0 in | Wt 130.5 lb

## 2015-03-13 DIAGNOSIS — I480 Paroxysmal atrial fibrillation: Secondary | ICD-10-CM

## 2015-03-13 MED ORDER — LISINOPRIL 5 MG PO TABS: 5.0000 mg | ORAL_TABLET | Freq: Every day | ORAL | Status: DC

## 2015-03-13 NOTE — Patient Instructions (Signed)
Medication Instructions:  Your physician has recommended you make the following change in your medication: 1) START Lisinopril 5 mg daily  Labwork: None ordered  Testing/Procedures: None ordered  Follow-Up: Your physician recommends that you schedule a follow-up appointment in: 3 months with Dr. [name].   If you need a refill on your cardiac medications before your next appointment, please call your pharmacy.  Thank you for choosing [facility]!!   Trinidad Curet, RN [phone]

## 2015-03-13 NOTE — Progress Notes (Signed)
Electrophysiology Office Note   Date:  03/13/2015   ID:  Roy Long 07/29/1950, MRN [id number]  PCP:  [name], MD  Cardiologist:  Peter Martinique Primary Electrophysiologist: Terrionna Bridwell Meredith Leeds, MD    Chief Complaint  Patient presents with  . Advice Only    discuss ablation     History of Present Illness: Roy Long is a 64 y.o. male who presents today for electrophysiology evaluation.    He has a history of atrial fibrillation on flecainide, hypertension, hyperlipidemia, depression. He was admitted to [facility] on 12/ 8/ 16 with palpitations noting atrial flutter with 2 to one conduction. He has atrial fibrillation because back to the early 2006 and has also had atrial flutter during that time. He underwent cardioversion in 2003 and has had one to 2 episodes of atrial fibrillation every 6 months control pill the pocket strategy with flecainide. He has had multiple episodes over the last 3-4 months occurring 4-5 times. The day of admission he went into atrial fibrillation and took flecainide and Cardizem. He was still in atrial fibrillation the next morning and was told to go to the emergency room. He was discharged  On 100 mg twice a day of flecainide which is increased from his prior dose , and an increased dose of diltiazem. Since he was discharged he has had no further arrhythmia. He has been going to the gym and working out. He has noticed that his heart rate gets to 120 and he is not able to get it any higher. He has also noticed that his blood pressure is consistently in the mid 140s. He was taken off of his amlodipine after his hospitalization.   Today, he denies symptoms of palpitations, chest pain, shortness of breath, orthopnea, PND, lower extremity edema, claudication, dizziness, presyncope, syncope, bleeding, or neurologic sequela. The patient is tolerating medications without difficulties and is otherwise without complaint today.    Past Medical History    Diagnosis Date  . Atrial fibrillation ([place])   . Hypertension   . Alcohol dependence ([place])   . GERD (gastroesophageal reflux disease)   . Age-related macular degeneration, dry, left eye   . Hypercholesterolemia dx'd 02/2015  . Squamous carcinoma ([facility])     hand, chest  . Basal cell carcinoma   . Kidney stones   . Family history of adverse reaction to anesthesia     "father had head injuries and dementia; everytime he was put under less of his mentation came back"  . Hypothyroidism   . Pneumonia ~ 2004  . Migraine aura without headache     "very rare now" (03/07/2015)  . Arthritis     "maybe a little bit in my left knee" (03/07/2015)  . Depression     hx   Past Surgical History  Procedure Laterality Date  . Anterior cervical decomp/discectomy fusion  04/2009  . Back surgery    . Inguinal hernia repair Bilateral 2004  . Cardioversion  06/2002  . Colonoscopy w/ polypectomy  02/2003  . Colonoscopy    . Cataract extraction w/ intraocular lens implant Left ~ 2012  . Mohs surgery Right     "temple"  . Cystoscopy w/ stone manipulation  1980's    "basketted it out; no stent"     Current Outpatient Prescriptions  Medication Sig Dispense Refill  . acetaminophen (TYLENOL) 325 MG tablet Take 325 mg by mouth every 6 (six) hours as needed for mild pain, moderate pain or headache.    Marland Kitchen  acyclovir (ZOVIRAX) 400 MG tablet TAKE 1 TABLET THREE TIMES A DAY (Patient taking differently: TAKE 400 MG BY MOUTH DAILY) 90 tablet 3  . apixaban (ELIQUIS) 5 MG TABS tablet Take 1 tablet (5 mg total) by mouth 2 (two) times daily. 180 tablet 3  . atorvastatin (LIPITOR) 20 MG tablet Take 1 tablet (20 mg total) by mouth daily. (Patient taking differently: Take 20 mg by mouth daily at 6 PM. ) 90 tablet 3  . ciprofloxacin (CIPRO) 500 MG tablet Take 1 tablet (500 mg total) by mouth 2 (two) times daily. 60 tablet 1  . cycloSPORINE (RESTASIS) 0.05 % ophthalmic emulsion Place 1 drop into both eyes 2 (two) times daily.     Marland Kitchen diltiazem (CARDIZEM CD) 180 MG 24 hr capsule Take 1 capsule (180 mg total) by mouth daily. (Patient taking differently: Take 180 mg by mouth as needed (tachycardia). ) 30 capsule 3  . finasteride (PROSCAR) 5 MG tablet TAKE 1 TABLET BY MOUTH DAILY (Patient taking differently: Take 5 mg by mouth daily. ) 90 tablet 3  . flecainide (TAMBOCOR) 100 MG tablet Take 1 tablet (100 mg total) by mouth 2 (two) times daily. 60 tablet 0  . levothyroxine (SYNTHROID, LEVOTHROID) 25 MCG tablet TAKE 1 TABLET BY MOUTH EVERY DAY BEFORE BREAKFAST 90 tablet 1  . Multiple Vitamins-Minerals (PRESERVISION AREDS PO) Take 1 mg by mouth 2 (two) times daily.     Marland Kitchen omeprazole (PRILOSEC) 20 MG capsule Take 20 mg by mouth daily.    . tamsulosin (FLOMAX) 0.4 MG CAPS capsule Take 1 capsule (0.4 mg total) by mouth daily. 90 capsule 3  . Vitamins-Lipotropics (LIPOFLAVONOID PO) Take 3 tablets by mouth daily at 12 noon.      No current facility-administered medications for this visit.    Allergies:   Review of patient's allergies indicates no known allergies.   Social History:  The patient  reports that he quit smoking about 31 years ago. His smoking use included Cigarettes. He has a 51 pack-year smoking history. He has never used smokeless tobacco. He reports that he drinks alcohol. He reports that he uses illicit drugs (Marijuana, LSD, and Mescaline).   Family History:  The patient's family history includes Cancer in his brother; Heart failure in his mother; Stroke in his brother. There is no history of Heart disease, Hyperlipidemia, Hypertension, or Diabetes.    ROS:  Please see the history of present illness.  All other systems are reviewed and negative.    PHYSICAL EXAM: VS:  There were no vitals taken for this visit. , BMI There is no weight on file to calculate BMI. GEN: Well nourished, well developed, in no acute distress HEENT: normal Neck: no JVD, carotid bruits, or masses Cardiac: RRR; no murmurs, rubs, or  gallops,no edema  Respiratory:  clear to auscultation bilaterally, normal work of breathing GI: soft, nontender, nondistended, + BS MS: no deformity or atrophy Skin: warm and dry Neuro:  Strength and sensation are intact Psych: euthymic mood, full affect  EKG:  EKG is ordered today. The ekg ordered today shows sinus rhythm, rate 76, poor R progression  Recent Labs: 02/26/2015: ALT 13; TSH 4.08 03/07/2015: BUN 6; Creatinine, Ser 0.84; Hemoglobin 13.9; Magnesium 1.9; Platelets 226; Potassium 3.4*; Sodium 140    Lipid Panel     Component Value Date/Time   CHOL 193 02/26/2015 0925   TRIG 117.0 02/26/2015 0925   HDL 36.30* 02/26/2015 0925   CHOLHDL 5 02/26/2015 0925   VLDL 23.4 02/26/2015 0925  [place] 133* 02/26/2015 0925     Wt Readings from Last 3 Encounters:  03/06/15 125 lb (56.7 kg)  02/26/15 128 lb (58.06 kg)  12/12/14 128 lb (58.06 kg)      Other studies Reviewed: Additional studies/ records that were reviewed today include: TTE and MPI 12/12/14 Review of the above records today demonstrates:  Normal perfusion. LVEF 64% with normal wall motion. Excellent exercise tolerance. This is a low risk study.  - Left ventricle: The cavity size was normal. Systolic function was normal. The estimated ejection fraction was in the range of 60% to 65%. Wall motion was normal; there were no regional wall motion abnormalities. Doppler parameters are consistent with abnormal left ventricular relaxation (grade 1 diastolic dysfunction). - Mitral valve: Mildly thickened leaflets .   ASSESSMENT AND PLAN:  1.  Persistent atrial fibrillation/atrial flutter: had a long discussion with the patient about options for atrial fibrillation and atrial flutter treatment. Discussed the option of possibly medical management with his increased dose of flecainide as well as the possibility of ablation. We discussed the risk of ablation including bleeding, tamponade, stroke, and atrial  esophageal fistula. Patient feels well on his flecainide and diltiazem and would like to continue to try this medication regimen  To see if he'll be effective for him. If it is not effective , we Carly Sabo pursue ablation as the next option for treatment.  2.  Hypertension:  Has been taken off of his amlodipine since the diltiazem was increased. His blood pressure is consistently in the 140s. I have added 5 mg of lisinopril.    Current medicines are reviewed at length with the patient today.   The patient has concerns regarding his medicines.  The following changes were made today:  Lisinopril 5 mg  Labs/ tests ordered today include:  No orders of the defined types were placed in this encounter.     Disposition:   FU with Keno Caraway 3 months  Signed, Atira Borello Meredith Leeds, MD  03/13/2015 11:29 AM     [facility] [address] [place] [address] [phone] (office) [phone] (fax)

## 2015-03-17 ENCOUNTER — Telehealth: Payer: Self-pay | Admitting: *Deleted

## 2015-03-17 NOTE — Telephone Encounter (Signed)
RE: Follow up  Received: 3 days ago    Roy M Martinique, MD  Roy Kidney, RN           That's fine with me if that is OK with the patient.   Roy Martinique MD, [facility]       Previous Messages     ----- Message -----   From: Roy Kidney, RN   Sent: 03/13/2015  5:33 PM    To: Roy Kidney, RN, Roy M Martinique, MD, *  Subject: Follow up                     Roy Long consulted with patient today.  Patient has been seeing you since 2012 for AFib & HTN. Is it ok to transfer him to Roy Long care for f/u of this?  Roy Long says that he is more than happy to take over if this is all you are seeing him for.  Please let us know.   Thanks,  Trinidad Curet, RN

## 2015-03-17 NOTE — Telephone Encounter (Addendum)
Patient tells me his HR is suppressed.  Currently taking Flecainide 100 mg BID and Diltiazem 180 mg daily. Says that when he is active he cannot get his HR up. Using elliptical he couldn't get rate above 108 and is working very hard to but unable. Before starting Diltiazem daily he was able to get it to 120 or better upon exercising. He is usually very active and concerned about future.  He would like to know: 1) Will he have to live with this?  2) Can med adjustment make improvement?   3)If he waits longer will it settle down? States that if he is to remain on meds and continue current plan - then he would like to reconsider ablation and discuss if necessary. Will review with Dr. [name] and call patient with recommendations.  Patient is agreeable to plan.

## 2015-03-17 NOTE — Telephone Encounter (Signed)
Called patient to inform him

## 2015-03-19 MED ORDER — FLECAINIDE ACETATE 100 MG PO TABS: 100.0000 mg | ORAL_TABLET | Freq: Two times a day (BID) | ORAL | Status: DC

## 2015-03-19 MED ORDER — METOPROLOL TARTRATE 25 MG PO TABS: 25.0000 mg | ORAL_TABLET | Freq: Two times a day (BID) | ORAL | Status: DC

## 2015-03-19 NOTE — Telephone Encounter (Signed)
Instructed patient to stop Diltiazem.  Start Metoprolol 25 mg BID. Rx sent to [facility]   Advised to monitor BPs and HRs while taking this medication. Advised to call office with any new side effects that begin after starting new medication. Patient verbalized understanding and agreeable to plan.

## 2015-03-25 ENCOUNTER — Telehealth: Payer: Self-pay | Admitting: Cardiology

## 2015-03-25 NOTE — Telephone Encounter (Signed)
Patient tells me that he has no improvement with exercise, HR and new medication started last week (Metoprolol).  States he still is having a problem getting HR "up" with exercise.  Today he could only get it to 92 bpm before he had to stop - he was weak and muscles were burning trying to get HR "up" - felt like he was going to pass out if continued. He is an outdoors men and does not like idea of not enjoying things like he wants to.  He loves to be outside.  He goes hiking often. We discussed and agreed that patient will remain on current meds and scheduled appt next week with Dr. [name] to discuss next step in plan of care. Patient verbalized understanding and agreeable to plan.

## 2015-03-25 NOTE — Telephone Encounter (Signed)
New message  Pt c/o medication issue:  1. Name of Medication: meteprolol  2. How are you currently taking this medication (dosage and times per day)? metoprolol tartrate (LOPRESSOR) 25 MG tablet  4. What is your medication issue? Pt reports he recently went to the hospital diltiazem was added which lowered his exercise heart rate. After he complained about that they added metoprolol tartrate (LOPRESSOR) 25 MG tablet and that suppressed his heart rate even more. He says that if he has anymore side effects he will not be happy. Request a call back to discuss this medication

## 2015-03-26 ENCOUNTER — Encounter: Payer: Self-pay | Admitting: Internal Medicine

## 2015-03-26 ENCOUNTER — Ambulatory Visit (INDEPENDENT_AMBULATORY_CARE_PROVIDER_SITE_OTHER): Payer: Medicare Other | Admitting: Internal Medicine

## 2015-03-26 VITALS — BP 130/70 | HR 60 | Temp 97.6°F | Resp 16 | Ht 68.0 in | Wt 131.0 lb

## 2015-03-26 DIAGNOSIS — R300 Dysuria: Secondary | ICD-10-CM | POA: Diagnosis not present

## 2015-03-26 DIAGNOSIS — R350 Frequency of micturition: Secondary | ICD-10-CM

## 2015-03-26 DIAGNOSIS — N35919 Unspecified urethral stricture, male, unspecified site: Secondary | ICD-10-CM

## 2015-03-26 LAB — POCT URINALYSIS DIPSTICK
Bilirubin, UA: NEGATIVE
Glucose, UA: NEGATIVE
KETONES UA: NEGATIVE
LEUKOCYTES UA: NEGATIVE
Nitrite, UA: NEGATIVE
PH UA: 6
PROTEIN UA: NEGATIVE
RBC UA: NEGATIVE
SPEC GRAV UA: 1.015
UROBILINOGEN UA: 0.2

## 2015-03-26 MED ORDER — HYOSCYAMINE SULFATE ER 0.375 MG PO TB12: 0.3750 mg | ORAL_TABLET | Freq: Two times a day (BID) | ORAL | Status: DC

## 2015-03-26 NOTE — Patient Instructions (Signed)
Urethritis, Adult °Urethritis is an inflammation of the tube through which urine exits your bladder (urethra).  °CAUSES °Urethritis is often caused by an infection in your urethra. The infection can be viral, like herpes. The infection can also be bacterial, like gonorrhea. °RISK FACTORS °Risk factors of urethritis include: °· Having sex without using a condom. °· Having multiple sexual partners. °· Having poor hygiene. °SIGNS AND SYMPTOMS °Symptoms of urethritis are less noticeable in women than in men. These symptoms include: °· Burning feeling when you urinate (dysuria). °· Discharge from your urethra. °· Blood in your urine (hematuria). °· Urinating more than usual. °DIAGNOSIS  °To confirm a diagnosis of urethritis, your health care provider will do the following: °· Ask about your sexual history. °· Perform a physical exam. °· Have you provide a sample of your urine for lab testing. °· Use a cotton swab to gently collect a sample from your urethra for lab testing. °TREATMENT  °It is important to treat urethritis. Depending on the cause, untreated urethritis may lead to serious genital infections and possibly infertility. Urethritis caused by a bacterial infection is treated with antibiotic medicine. All sexual partners must be treated.  °HOME CARE INSTRUCTIONS °· Do not have sex until the test results are known and treatment is completed, even if your symptoms go away before you finish treatment. °· If you were prescribed an antibiotic, finish it all even if you start to feel better. °SEEK MEDICAL CARE IF:  °· Your symptoms are not improved in 3 days. °· Your symptoms are getting worse. °· You develop abdominal pain or pelvic pain (in women). °· You develop joint pain. °· You have a fever. °SEEK IMMEDIATE MEDICAL CARE IF:  °· You have severe pain in the belly, back, or side. °· You have repeated vomiting. °MAKE SURE YOU: °· Understand these instructions. °· Will watch your condition. °· Will get help right away  if you are not doing well or get worse. °  °This information is not intended to replace advice given to you by your health care provider. Make sure you discuss any questions you have with your health care provider. °  °Document Released: 09/08/2000 Document Revised: 07/30/2014 Document Reviewed: 11/13/2012 °Elsevier Interactive Patient Education ©2016 [employer]. ° °

## 2015-03-26 NOTE — Progress Notes (Signed)
Subjective:  Patient ID: Roy Long, male    DOB: 1950/11/09  Age: 64 y.o. MRN: [id number]  CC: Penis Pain   HPI Roy Long presents for the complaint of a stinging pain at the base of his penis that occurs after he urinates. This has been occurring for the last 3-4 days. He notices it mostly at night. He has nocturia about 3-4 times per night and after he returns to the bed he feels a painful, stinging sensation at the base of his penis. There is no dysuria when he urinates. He has not noticed any discharge, blood in the urine, groin or scrotal swelling, or urinary hesitancy.  Outpatient Prescriptions Prior to Visit  Medication Sig Dispense Refill  . acetaminophen (TYLENOL) 325 MG tablet Take 325 mg by mouth every 6 (six) hours as needed for mild pain, moderate pain or headache.    Marland Kitchen acyclovir (ZOVIRAX) 400 MG tablet TAKE 1 TABLET THREE TIMES A DAY (Patient taking differently: TAKE 400 MG BY MOUTH DAILY) 90 tablet 3  . apixaban (ELIQUIS) 5 MG TABS tablet Take 1 tablet (5 mg total) by mouth 2 (two) times daily. 180 tablet 3  . atorvastatin (LIPITOR) 20 MG tablet Take 1 tablet (20 mg total) by mouth daily. (Patient taking differently: Take 20 mg by mouth daily at 6 PM. ) 90 tablet 3  . cholecalciferol (VITAMIN D) 1000 UNITS tablet Take 1,000 Units by mouth daily.    . ciprofloxacin (CIPRO) 500 MG tablet Take 1 tablet (500 mg total) by mouth 2 (two) times daily. 60 tablet 1  . cycloSPORINE (RESTASIS) 0.05 % ophthalmic emulsion Place 1 drop into both eyes 2 (two) times daily.    . finasteride (PROSCAR) 5 MG tablet TAKE 1 TABLET BY MOUTH DAILY (Patient taking differently: Take 5 mg by mouth daily. ) 90 tablet 3  . flecainide (TAMBOCOR) 100 MG tablet Take 1 tablet (100 mg total) by mouth 2 (two) times daily. 60 tablet 6  . levothyroxine (SYNTHROID, LEVOTHROID) 25 MCG tablet TAKE 1 TABLET BY MOUTH EVERY DAY BEFORE BREAKFAST 90 tablet 1  . lisinopril (PRINIVIL,ZESTRIL) 5 MG tablet Take 1  tablet (5 mg total) by mouth daily. 90 tablet 1  . metoprolol tartrate (LOPRESSOR) 25 MG tablet Take 1 tablet (25 mg total) by mouth 2 (two) times daily. 60 tablet 4  . Multiple Vitamins-Minerals (PRESERVISION AREDS PO) Take 1 mg by mouth 2 (two) times daily.     Marland Kitchen omeprazole (PRILOSEC) 20 MG capsule Take 20 mg by mouth daily.    . tamsulosin (FLOMAX) 0.4 MG CAPS capsule Take 1 capsule (0.4 mg total) by mouth daily. 90 capsule 3  . Vitamins-Lipotropics (LIPOFLAVONOID PO) Take 3 tablets by mouth daily at 12 noon.      No facility-administered medications prior to visit.    ROS Review of Systems  Constitutional: Negative.  Negative for fever, chills, diaphoresis, appetite change and fatigue.  HENT: Negative.   Eyes: Negative.   Respiratory: Negative.  Negative for cough, choking, chest tightness, shortness of breath and stridor.   Cardiovascular: Negative.  Negative for chest pain, palpitations and leg swelling.  Gastrointestinal: Negative.  Negative for nausea, vomiting, abdominal pain, diarrhea and constipation.  Endocrine: Negative.   Genitourinary: Positive for frequency and penile pain. Negative for dysuria, urgency, hematuria, flank pain, decreased urine volume, discharge, penile swelling, scrotal swelling, enuresis, difficulty urinating, genital sores and testicular pain.  Musculoskeletal: Negative.  Negative for myalgias, back pain, joint swelling and arthralgias.  Skin: Negative.   Allergic/Immunologic: Negative.   Neurological: Negative.  Negative for dizziness and weakness.  Hematological: Negative.  Negative for adenopathy. Does not bruise/bleed easily.  Psychiatric/Behavioral: Negative.     Objective:  BP 130/70 mmHg  Pulse 60  Temp(Src) 97.6 F (36.4 C) (Oral)  Ht 5\' 8"  (1.727 m)  Wt 131 lb (59.421 kg)  BMI 19.92 kg/m2  SpO2 98%  BP Readings from Last 3 Encounters:  03/26/15 130/70  03/13/15 140/72  03/07/15 129/57    Wt Readings from Last 3 Encounters:    03/26/15 131 lb (59.421 kg)  03/13/15 130 lb 8 oz (59.194 kg)  03/06/15 125 lb (56.7 kg)    Physical Exam  Constitutional: He is oriented to person, place, and time. No distress.  HENT:  Head: Normocephalic and atraumatic.  Mouth/Throat: Oropharynx is clear and moist. No oropharyngeal exudate.  Eyes: Conjunctivae are normal. Right eye exhibits no discharge. Left eye exhibits no discharge. No scleral icterus.  Neck: Normal range of motion. Neck supple. No JVD present. No tracheal deviation present. No thyromegaly present.  Cardiovascular: Normal rate, regular rhythm, normal heart sounds and intact distal pulses.  Exam reveals no gallop and no friction rub.   No murmur heard. Pulmonary/Chest: Effort normal and breath sounds normal. No stridor. No respiratory distress. He has no wheezes. He has no rales. He exhibits no tenderness.  Abdominal: Soft. Bowel sounds are normal. He exhibits no distension and no mass. There is no tenderness. There is no rebound and no guarding. Hernia confirmed negative in the right inguinal area and confirmed negative in the left inguinal area.  Genitourinary: Prostate normal, testes normal and penis normal. Rectal exam shows no external hemorrhoid, no internal hemorrhoid, no fissure, no mass, no tenderness and anal tone normal. Guaiac negative stool. Prostate is not enlarged and not tender. Right testis shows no mass, no swelling and no tenderness. Right testis is descended. Left testis shows no mass, no swelling and no tenderness. Left testis is descended. Circumcised. No penile erythema or penile tenderness. No discharge found.  Musculoskeletal: Normal range of motion. He exhibits no edema or tenderness.  Lymphadenopathy:    He has no cervical adenopathy.       Right: No inguinal adenopathy present.       Left: No inguinal adenopathy present.  Neurological: He is oriented to person, place, and time.  Skin: Skin is warm and dry. No rash noted. He is not  diaphoretic. No erythema. No pallor.  Vitals reviewed.   Lab Results  Component Value Date   WBC 6.6 03/07/2015   HGB 13.9 03/07/2015   HCT 40.3 03/07/2015   PLT 226 03/07/2015   GLUCOSE 97 03/07/2015   CHOL 193 02/26/2015   TRIG 117.0 02/26/2015   HDL 36.30* 02/26/2015   LDLCALC 133* 02/26/2015   ALT 13 02/26/2015   AST 13 02/26/2015   NA 140 03/07/2015   K 3.4* 03/07/2015   CL 109 03/07/2015   CREATININE 0.84 03/07/2015   BUN 6 03/07/2015   CO2 24 03/07/2015   TSH 4.08 02/26/2015   PSA 0.17 02/26/2015   INR 1.0 04/15/2011   HGBA1C 5.1 04/15/2011    Dg Chest 2 View  03/06/2015  CLINICAL DATA:  Tachycardia and atrial fibrillation for 1 day EXAM: CHEST - 2 VIEW COMPARISON:  04/30/2009 FINDINGS: Cardiac shadow is within normal limits. The lungs are well aerated bilaterally. Parenchymal scarring is again seen in the right apex stable from the prior exam. No focal infiltrate  or sizable effusion is noted. No bony abnormality is seen. IMPRESSION: No active disease. Electronically Signed   By: Inez Catalina M.D.   On: 03/06/2015 18:15    Assessment & Plan:   Lyzander was seen today for penis pain.  Diagnoses and all orders for this visit:  Frequency of urination- his dipstick urinalysis is normal, there is no protein, no red blood cells, and no white blood cells. Nitrites are negative. -     POCT urinalysis dipstick  Urethral spasm- he has recently had a prostate infection and was subsequently admitted to the hospital with atrial fibrillation/flutter. He has had a very complicated medical history recently and I believe this has predisposed him to urethral spasms. Will treat with Levbid as needed. -     hyoscyamine (LEVBID) 0.375 MG 12 hr tablet; Take 1 tablet (0.375 mg total) by mouth 2 (two) times daily.   I am having Mr. Peggs start on hyoscyamine. I am also having him maintain his Vitamins-Lipotropics (LIPOFLAVONOID PO), Multiple Vitamins-Minerals (PRESERVISION AREDS PO),  apixaban, acyclovir, cycloSPORINE, tamsulosin, ciprofloxacin, atorvastatin, finasteride, omeprazole, acetaminophen, levothyroxine, cholecalciferol, lisinopril, metoprolol tartrate, and flecainide.  Meds ordered this encounter  Medications  . hyoscyamine (LEVBID) 0.375 MG 12 hr tablet    Sig: Take 1 tablet (0.375 mg total) by mouth 2 (two) times daily.    Dispense:  60 tablet    Refill:  2     Follow-up: Return in about 3 weeks (around 04/16/2015).  [name], MD

## 2015-03-26 NOTE — Progress Notes (Signed)
Pre visit review using our clinic review tool, if applicable. No additional management support is needed unless otherwise documented below in the visit note. 

## 2015-03-29 ENCOUNTER — Other Ambulatory Visit: Payer: Self-pay | Admitting: Cardiology

## 2015-04-01 NOTE — Telephone Encounter (Signed)
Rx request sent to pharmacy.  

## 2015-04-02 ENCOUNTER — Encounter: Payer: Self-pay | Admitting: Cardiology

## 2015-04-02 ENCOUNTER — Encounter: Payer: Self-pay | Admitting: *Deleted

## 2015-04-02 ENCOUNTER — Ambulatory Visit (INDEPENDENT_AMBULATORY_CARE_PROVIDER_SITE_OTHER): Payer: Medicare Other | Admitting: Cardiology

## 2015-04-02 ENCOUNTER — Other Ambulatory Visit: Payer: Self-pay | Admitting: Internal Medicine

## 2015-04-02 VITALS — BP 106/68 | HR 61 | Ht 68.0 in | Wt 130.0 lb

## 2015-04-02 DIAGNOSIS — I481 Persistent atrial fibrillation: Secondary | ICD-10-CM

## 2015-04-02 DIAGNOSIS — I4819 Other persistent atrial fibrillation: Secondary | ICD-10-CM

## 2015-04-02 MED ORDER — DILTIAZEM HCL ER COATED BEADS 180 MG PO CP24: 180.0000 mg | ORAL_CAPSULE | Freq: Every day | ORAL | Status: DC

## 2015-04-02 NOTE — Addendum Note (Signed)
Addended by: Stanton Kidney on: 04/02/2015 01:26 PM   Modules accepted: Orders

## 2015-04-02 NOTE — Progress Notes (Signed)
Electrophysiology Office Note   Date:  04/02/2015   ID:  Roy Long, Roy Long 1951-02-06, MRN [id number]  PCP:  [name], MD  Cardiologist:  Peter Martinique Primary Electrophysiologist:  Chrisette Man Meredith Leeds, MD    Chief Complaint  Patient presents with  . Atrial Fibrillation     History of Present Illness: Roy Long is a 65 y.o. male who presents today for electrophysiology evaluation.   He has a history of atrial fibrillation on flecainide, hypertension, hyperlipidemia, depression. He was admitted to [facility] on 12/ 8/ 16 with palpitations noting atrial flutter with 2 to one conduction. He has atrial fibrillation because back to the early 2006 and has also had atrial flutter during that time. He underwent cardioversion in 2003 and has had one to 2 episodes of atrial fibrillation every 6 months control pill the pocket strategy with flecainide. He has had multiple episodes over the last 3-4 months occurring 4-5 times. The day of admission he went into atrial fibrillation and took flecainide and Cardizem. He was still in atrial fibrillation the next morning and was told to go to the emergency room. He was discharged On 100 mg twice a day of flecainide which is increased from his prior dose , and an increased dose of diltiazem. Since he was discharged he has had no further arrhythmia. He has been going to the gym and working out.   He returns to clinic today with further complaints of fatigue weakness. He says that he is unsure if he has gone in or out of atrial fibrillation, but does feel like the medications are playing a part. We did discuss atrial fibrillation ablation at the last visit, and he is now interested in that procedure.  Today, he denies symptoms of palpitations, chest pain, shortness of breath, orthopnea, PND, lower extremity edema, claudication, dizziness, presyncope, syncope, bleeding, or neurologic sequela. The patient is tolerating medications without difficulties and  is otherwise without complaint today.    Past Medical History  Diagnosis Date  . Atrial fibrillation ([place])   . Hypertension   . Alcohol dependence ([place])   . GERD (gastroesophageal reflux disease)   . Age-related macular degeneration, dry, left eye   . Hypercholesterolemia dx'd 02/2015  . Squamous carcinoma ([facility])     hand, chest  . Basal cell carcinoma   . Kidney stones   . Family history of adverse reaction to anesthesia     "father had head injuries and dementia; everytime he was put under less of his mentation came back"  . Hypothyroidism   . Pneumonia ~ 2004  . Migraine aura without headache     "very rare now" (03/07/2015)  . Arthritis     "maybe a little bit in my left knee" (03/07/2015)  . Depression     hx   Past Surgical History  Procedure Laterality Date  . Anterior cervical decomp/discectomy fusion  04/2009  . Back surgery    . Inguinal hernia repair Bilateral 2004  . Cardioversion  06/2002  . Colonoscopy w/ polypectomy  02/2003  . Colonoscopy    . Cataract extraction w/ intraocular lens implant Left ~ 2012  . Mohs surgery Right     "temple"  . Cystoscopy w/ stone manipulation  1980's    "basketted it out; no stent"     Current Outpatient Prescriptions  Medication Sig Dispense Refill  . acetaminophen (TYLENOL) 325 MG tablet Take 325 mg by mouth every 6 (six) hours as needed for mild pain, moderate  pain or headache.    Marland Kitchen acyclovir (ZOVIRAX) 400 MG tablet TAKE 1 TABLET THREE TIMES A DAY (Patient taking differently: TAKE 400 MG BY MOUTH DAILY) 90 tablet 3  . apixaban (ELIQUIS) 5 MG TABS tablet Take 1 tablet (5 mg total) by mouth 2 (two) times daily. 180 tablet 3  . atorvastatin (LIPITOR) 20 MG tablet Take 1 tablet (20 mg total) by mouth daily. (Patient taking differently: Take 20 mg by mouth daily at 6 PM. ) 90 tablet 3  . cholecalciferol (VITAMIN D) 1000 UNITS tablet Take 1,000 Units by mouth daily.    . cycloSPORINE (RESTASIS) 0.05 % ophthalmic emulsion Place 1  drop into both eyes 2 (two) times daily.    . finasteride (PROSCAR) 5 MG tablet TAKE 1 TABLET BY MOUTH DAILY (Patient taking differently: Take 5 mg by mouth daily. ) 90 tablet 3  . flecainide (TAMBOCOR) 100 MG tablet Take 1 tablet (100 mg total) by mouth 2 (two) times daily. 60 tablet 6  . hyoscyamine (LEVBID) 0.375 MG 12 hr tablet Take 1 tablet (0.375 mg total) by mouth 2 (two) times daily. 60 tablet 2  . levothyroxine (SYNTHROID, LEVOTHROID) 25 MCG tablet TAKE 1 TABLET BY MOUTH EVERY DAY BEFORE BREAKFAST 90 tablet 1  . lisinopril (PRINIVIL,ZESTRIL) 5 MG tablet Take 1 tablet (5 mg total) by mouth daily. 90 tablet 1  . metoprolol tartrate (LOPRESSOR) 25 MG tablet Take 1 tablet (25 mg total) by mouth 2 (two) times daily. 60 tablet 4  . Multiple Vitamins-Minerals (PRESERVISION AREDS PO) Take 1 mg by mouth 2 (two) times daily.     Marland Kitchen omeprazole (PRILOSEC) 20 MG capsule Take 20 mg by mouth daily.    . tamsulosin (FLOMAX) 0.4 MG CAPS capsule Take 1 capsule (0.4 mg total) by mouth daily. 90 capsule 3  . Vitamins-Lipotropics (LIPOFLAVONOID PO) Take 3 tablets by mouth daily at 12 noon.     Marland Kitchen amLODipine (NORVASC) 5 MG tablet TAKE 1 TABLET (5 MG TOTAL) BY MOUTH DAILY. (Patient not taking: Reported on 04/02/2015) 90 tablet 1   No current facility-administered medications for this visit.    Allergies:   Review of patient's allergies indicates no known allergies.   Social History:  The patient  reports that he quit smoking about 32 years ago. His smoking use included Cigarettes. He has a 51 pack-year smoking history. He has never used smokeless tobacco. He reports that he drinks alcohol. He reports that he uses illicit drugs (Marijuana, LSD, and Mescaline).   Family History:  The patient's family history includes Cancer in his brother; Heart failure in his mother; Stroke in his brother. There is no history of Heart disease, Hyperlipidemia, Hypertension, or Diabetes.    ROS:  Please see the history of present  illness.   Otherwise, review of systems is positive for fatigue, weakness.   All other systems are reviewed and negative.    PHYSICAL EXAM: VS:  BP 106/68 mmHg  Pulse 61  Ht 5\' 8"  (1.727 m)  Wt 130 lb (58.968 kg)  BMI 19.77 kg/m2  SpO2 98% , BMI Body mass index is 19.77 kg/(m^2). GEN: Well nourished, well developed, in no acute distress HEENT: normal Neck: no JVD, carotid bruits, or masses Cardiac: RRR; no murmurs, rubs, or gallops,no edema  Respiratory:  clear to auscultation bilaterally, normal work of breathing GI: soft, nontender, nondistended, + BS MS: no deformity or atrophy Skin: warm and dry Neuro:  Strength and sensation are intact Psych: euthymic mood, full affect  EKG: EKG is not ordered today. The ekg ordered 03/13/15 shows sinus rhythm, rate 76, poor R progression  Recent Labs: 02/26/2015: ALT 13; TSH 4.08 03/07/2015: BUN 6; Creatinine, Ser 0.84; Hemoglobin 13.9; Magnesium 1.9; Platelets 226; Potassium 3.4*; Sodium 140    Lipid Panel     Component Value Date/Time   CHOL 193 02/26/2015 0925   TRIG 117.0 02/26/2015 0925   HDL 36.30* 02/26/2015 0925   CHOLHDL 5 02/26/2015 0925   VLDL 23.4 02/26/2015 0925   LDLCALC 133* 02/26/2015 0925     Wt Readings from Last 3 Encounters:  04/02/15 130 lb (58.968 kg)  03/26/15 131 lb (59.421 kg)  03/13/15 130 lb 8 oz (59.194 kg)      Other studies Reviewed: Additional studies/ records that were reviewed today include: TTE and MPI 12/12/14 Review of the above records today demonstrates:  Normal perfusion. LVEF 64% with normal wall motion. Excellent exercise tolerance. This is a low risk study.  - Left ventricle: The cavity size was normal. Systolic function was normal. The estimated ejection fraction was in the range of 60% to 65%. Wall motion was normal; there were no regional wall motion abnormalities. Doppler parameters are consistent with abnormal left ventricular relaxation (grade 1  diastolic dysfunction). - Mitral valve: Mildly thickened leaflets .   ASSESSMENT AND PLAN:  1. Persistent atrial fibrillation/atrial flutter: Is currently not tolerating medical management of his atrial fibrillation/flutter. I have discussed with him the options of medical management with dofetilide as well as ablation. He feels like ablation, at this time, is the best course of treatment. I discussed the risks of the procedure, including bleeding, infection, tamponade, and stroke. I told him that his success rate of the procedure is between 70 and 75%. He has agreed to the procedure and we Alexa Blish proceed as soon as possible.  2. Hypertension: Well controlled today    Current medicines are reviewed at length with the patient today.   The patient has concerns regarding his medicines.  The following changes were made today:  Switch metoprolol to diltiazem  Labs/ tests ordered today include:  No orders of the defined types were placed in this encounter.     Disposition:   FU with Arisa Congleton post ablation  Signed, Glenda Spelman Meredith Leeds, MD  04/02/2015 11:56 AM     [facility] [address] [place] [address] [phone] (office) [phone] (fax)

## 2015-04-02 NOTE — Addendum Note (Signed)
Addended by: Stanton Kidney on: 04/02/2015 12:32 PM   Modules accepted: Orders, Medications

## 2015-04-02 NOTE — Patient Instructions (Addendum)
Medication Instructions:  Your physician has recommended you make the following change in your medication: 1) STOP Metoprolol 2) START Diltiazem 180 mg daily  Labwork: Your physician recommends that you return for lab work on the same day as your cardiac CT   Testing/Procedures: Your physician has requested that you have cardiac CT. Cardiac computed tomography (CT) is a painless test that uses an x-ray machine to take clear, detailed pictures of your heart. For further information please visit [email or web address]. Please follow instruction sheet as given.  Your physician has recommended that you have an Atrial fibrillation ablation. Catheter ablation is a medical procedure used to treat some cardiac arrhythmias (irregular heartbeats). During catheter ablation, a long, thin, flexible tube is put into a blood vessel in your groin (upper thigh), or neck. This tube is called an ablation catheter. It is then guided to your heart through the blood vessel. Radio frequency waves destroy small areas of heart tissue where abnormal heartbeats may cause an arrhythmia to start. Please see the instruction sheet given to you today.  Follow-Up: Your physician recommends that you schedule a follow-up appointment in: 4 weeks, after 05/30/2015, with Roderic Palau, NP.  Your physician recommends that you schedule a follow-up appointment in: 3 months, after 05/30/2015, with Dr. [name].  If you need a refill on your cardiac medications before your next appointment, please call your pharmacy.  Thank you for choosing [facility]!!   Trinidad Curet, RN [phone]   Cardiac Ablation Cardiac ablation is a procedure to disable a small amount of heart tissue in very specific places. The heart has many electrical connections. Sometimes these connections are abnormal and can cause the heart to beat very fast or irregularly. By disabling some of the problem areas, heart rhythm can be improved or made normal.  Ablation is done for people who:   Have Wolff-Parkinson-White syndrome.   Have other fast heart rhythms (tachycardia).   Have taken medicines for an abnormal heart rhythm (arrhythmia) that resulted in:   No success.   Side effects.   May have a high-risk heartbeat that could result in death.  LET [facility] CARE PROVIDER KNOW ABOUT:   Any allergies you have or any previous reactions you have had to X-ray dye, food (such as seafood), medicine, or tape.   All medicines you are taking, including vitamins, herbs, eye drops, creams, and over-the-counter medicines.   Previous problems you or members of your family have had with the use of anesthetics.   Any blood disorders you have.   Previous surgeries or procedures (such as a kidney transplant) you have had.   Medical conditions you have (such as kidney failure).  RISKS AND COMPLICATIONS Generally, cardiac ablation is a safe procedure. However, problems can occur and include:   Increased risk of cancer. Depending on how long it takes to do the ablation, the dose of radiation can be high.  Bruising and bleeding where a thin, flexible tube (catheter) was inserted during the procedure.   Bleeding into the chest, especially into the sac that surrounds the heart (serious).  Need for a permanent pacemaker if the normal electrical system is damaged.   The procedure may not be fully effective, and this may not be recognized for months. Repeat ablation procedures are sometimes required. BEFORE THE PROCEDURE   Follow any instructions from your health care provider regarding eating and drinking before the procedure.   Take your medicines as directed at regular times with water, unless instructed otherwise by your  health care provider. If you are taking diabetes medicine, including insulin, ask how you are to take it and if there are any special instructions you should follow. It is common to adjust insulin dosing the day  of the ablation.  PROCEDURE  An ablation is usually performed in a catheterization laboratory with the guidance of fluoroscopy. Fluoroscopy is a type of X-ray that helps your health care provider see images of your heart during the procedure.   An ablation is a minimally invasive procedure. This means a small cut (incision) is made in either your neck or groin. Your health care provider will decide where to make the incision based on your medical history and physical exam.  An IV tube will be started before the procedure begins. You will be given an anesthetic or medicine to help you relax (sedative).  The skin on your neck or groin will be numbed. A needle will be inserted into a large vein in your neck or groin and catheters will be threaded to your heart.  A special dye that shows up on fluoroscopy pictures may be injected through the catheter. The dye helps your health care provider see the area of the heart that needs treatment.  The catheter has electrodes on the tip. When the area of heart tissue that is causing the arrhythmia is found, the catheter tip will send an electrical current to the area and "scar" the tissue. Three types of energy can be used to ablate the heart tissue:   Heat (radiofrequency energy).   Laser energy.   Extreme cold (cryoablation).   When the area of the heart has been ablated, the catheter will be taken out. Pressure will be held on the insertion site. This will help the insertion site clot and keep it from bleeding. A bandage will be placed on the insertion site.  AFTER THE PROCEDURE   After the procedure, you will be taken to a recovery area where your vital signs (blood pressure, heart rate, and breathing) will be monitored. The insertion site will also be monitored for bleeding.   You will need to lie still for 4-6 hours. This is to ensure you do not bleed from the catheter insertion site.    This information is not intended to replace  advice given to you by your health care provider. Make sure you discuss any questions you have with your health care provider.   Document Released: 08/01/2008 Document Revised: 04/05/2014 Document Reviewed: 08/07/2012 Elsevier Interactive Patient Education [employer].

## 2015-04-08 ENCOUNTER — Encounter: Payer: Self-pay | Admitting: Internal Medicine

## 2015-04-10 ENCOUNTER — Telehealth: Payer: Self-pay | Admitting: Cardiology

## 2015-04-10 NOTE — Telephone Encounter (Addendum)
Contacted the pt back and informed him that I spoke with our Violet NP, and per Mickel Baas she recommends that the pt take OTC maalox, to see if this provides him relief with his "gas issues," and if this does not dissipate this issue, then he should report to the ER for further evaluation and cardiac work-up.  Per the pt, he states that "since I've been off the phone with you, I expelled more gas and I feel much better. " Informed the pt to go ahead and get some Maalox and I will forward this message to Dr Baird Kay and his nurse for their review upon returning to the office.  Pt verbalized understanding and agrees with this plan.

## 2015-04-10 NOTE — Telephone Encounter (Signed)
Pt is calling in to the office to inform Dr [name] that he has been experiencing chest pain, since this morning, that only comes when he "inhales and exhales." Pt states that he has no complaints of sob, DOE, dizziness, palpitations, pre-syncopal, or syncopal episodes.  Pt states that this "does not feel like the typical chest pain he gets, when he goes in to afib/flutter.  Pt states that it seems to feel more like "gas pain in his chest," for he states "when I pass gas this alleviates the pain." Pt states his vital signs are stable at 140/70 and HR 68 and regular. Pt states he took 2 baby ASA, despite he is not prescribed this med.  Pt has taken all his other cardiac meds, as prescribed.  Pt states the chest pain is located mostly on his left sternum area.  Pt wants for Dr [name] to advise if he should go to the ER for further eval, or wait to see if his symptoms subside.  Informed the pt that Dr [name] and his nurse are both out of the office today. Pt requesting that I speak with another MD in the office about his complaints, for he states "this will make me feel better, even though I feel its nothing." Noted in the pts chart that he is scheduled for afib ablation on 05/30/15, for persistent afib.  Informed the pt that I will go and speak with our DOD/Flex and follow-up with him thereafter with recommendations.  Advised the pt to avoid taking any more ASA at this time, or in the future, for he is already anticoagulated with Eliquis.  Pt verbalized understanding and agrees with this plan.

## 2015-04-10 NOTE — Telephone Encounter (Signed)
New message     Pt C/O of Chest Pain: 1. Are you having CP right now? No pain scale from 1-10 : 5  2. Are you experiencing any other symptoms (ex. SOB, nausea, vomiting, sweating)? No sob  3. How long have you been experiencing CP? This am thought it was indigestion  4. Is your CP continuous or coming and going? Only when taken a deep breathe - left chest sided  5. Have you taken Nitroglycerin? No . Did take a ASA

## 2015-04-16 ENCOUNTER — Ambulatory Visit (INDEPENDENT_AMBULATORY_CARE_PROVIDER_SITE_OTHER): Payer: Medicare Other | Admitting: Internal Medicine

## 2015-04-16 ENCOUNTER — Encounter: Payer: Self-pay | Admitting: Internal Medicine

## 2015-04-16 VITALS — BP 120/58 | HR 71 | Temp 97.8°F | Resp 16 | Ht 68.0 in | Wt 129.0 lb

## 2015-04-16 DIAGNOSIS — N3281 Overactive bladder: Secondary | ICD-10-CM

## 2015-04-16 DIAGNOSIS — E038 Other specified hypothyroidism: Secondary | ICD-10-CM | POA: Diagnosis not present

## 2015-04-16 MED ORDER — LEVOTHYROXINE SODIUM 25 MCG PO TABS: ORAL_TABLET | ORAL | Status: DC

## 2015-04-16 MED ORDER — MIRABEGRON ER 50 MG PO TB24: 50.0000 mg | ORAL_TABLET | Freq: Every day | ORAL | Status: DC

## 2015-04-16 NOTE — Progress Notes (Signed)
Subjective:  Patient ID: Roy Long, male    DOB: 03-09-1951  Age: 65 y.o. MRN: [id number]  CC: Urinary Frequency   HPI ANGUS HENDRIE presents for the complaint of frequent urination. He states he is getting up about 4-5 times per night to urinate and is urinating frequently during the day. He does admit to drinking about 4-5 cups of coffee a day. When I last saw him about 3 weeks ago he had dysuria and penile discomfort. He took [place] for about 5 days and said those symptoms have resolved so he has not started taking that again. He denies dysuria, hematuria, straining, or dribbling. He tells me that Flomax helped some initially but is now not helping with the frequency.  Outpatient Prescriptions Prior to Visit  Medication Sig Dispense Refill  . acetaminophen (TYLENOL) 325 MG tablet Take 325 mg by mouth every 6 (six) hours as needed for mild pain, moderate pain or headache.    Marland Kitchen acyclovir (ZOVIRAX) 400 MG tablet TAKE 1 TABLET THREE TIMES A DAY (Patient taking differently: TAKE 400 MG BY MOUTH DAILY) 90 tablet 3  . apixaban (ELIQUIS) 5 MG TABS tablet Take 1 tablet (5 mg total) by mouth 2 (two) times daily. 180 tablet 3  . atorvastatin (LIPITOR) 20 MG tablet Take 1 tablet (20 mg total) by mouth daily. (Patient taking differently: Take 20 mg by mouth daily at 6 PM. ) 90 tablet 3  . cholecalciferol (VITAMIN D) 1000 UNITS tablet Take 1,000 Units by mouth daily.    . cycloSPORINE (RESTASIS) 0.05 % ophthalmic emulsion Place 1 drop into both eyes 2 (two) times daily.    Marland Kitchen diltiazem (CARDIZEM CD) 180 MG 24 hr capsule Take 1 capsule (180 mg total) by mouth daily. 30 capsule 6  . finasteride (PROSCAR) 5 MG tablet TAKE 1 TABLET BY MOUTH DAILY (Patient taking differently: Take 5 mg by mouth daily. ) 90 tablet 3  . flecainide (TAMBOCOR) 100 MG tablet Take 1 tablet (100 mg total) by mouth 2 (two) times daily. 60 tablet 6  . hyoscyamine (LEVBID) 0.375 MG 12 hr tablet Take 1 tablet (0.375 mg total) by  mouth 2 (two) times daily. 60 tablet 2  . lisinopril (PRINIVIL,ZESTRIL) 5 MG tablet Take 1 tablet (5 mg total) by mouth daily. 90 tablet 1  . Multiple Vitamins-Minerals (PRESERVISION AREDS PO) Take 1 mg by mouth 2 (two) times daily.     Marland Kitchen omeprazole (PRILOSEC) 20 MG capsule Take 20 mg by mouth daily.    . tamsulosin (FLOMAX) 0.4 MG CAPS capsule Take 1 capsule (0.4 mg total) by mouth daily. 90 capsule 3  . Vitamins-Lipotropics (LIPOFLAVONOID PO) Take 3 tablets by mouth daily at 12 noon.     Marland Kitchen levothyroxine (SYNTHROID, LEVOTHROID) 25 MCG tablet TAKE 1 TABLET BY MOUTH EVERY DAY BEFORE BREAKFAST 90 tablet 1  . amLODipine (NORVASC) 5 MG tablet TAKE 1 TABLET (5 MG TOTAL) BY MOUTH DAILY. (Patient not taking: Reported on 04/02/2015) 90 tablet 1   No facility-administered medications prior to visit.    ROS Review of Systems  Constitutional: Negative.   HENT: Negative.   Eyes: Negative.   Respiratory: Negative.  Negative for cough, choking, chest tightness, shortness of breath and stridor.   Cardiovascular: Negative.  Negative for chest pain, palpitations and leg swelling.  Gastrointestinal: Negative.  Negative for nausea, vomiting, abdominal pain, diarrhea and constipation.  Endocrine: Positive for polyuria. Negative for polydipsia and polyphagia.  Genitourinary: Positive for urgency and frequency. Negative for  dysuria, hematuria, flank pain, decreased urine volume, discharge, enuresis and difficulty urinating.  Musculoskeletal: Negative.   Skin: Negative.  Negative for color change and rash.  Allergic/Immunologic: Negative.   Neurological: Negative.  Negative for dizziness.  Hematological: Negative.  Negative for adenopathy. Does not bruise/bleed easily.  Psychiatric/Behavioral: Negative.     Objective:  BP 120/58 mmHg  Pulse 71  Temp(Src) 97.8 F (36.6 C) (Oral)  Resp 16  Ht 5\' 8"  (1.727 m)  Wt 129 lb (58.514 kg)  BMI 19.62 kg/m2  SpO2 98%  BP Readings from Last 3 Encounters:    04/16/15 120/58  04/02/15 106/68  03/26/15 130/70    Wt Readings from Last 3 Encounters:  04/16/15 129 lb (58.514 kg)  04/02/15 130 lb (58.968 kg)  03/26/15 131 lb (59.421 kg)    Physical Exam  Constitutional: He is oriented to person, place, and time. He appears well-developed and well-nourished. No distress.  HENT:  Head: Normocephalic and atraumatic.  Mouth/Throat: Oropharynx is clear and moist. No oropharyngeal exudate.  Eyes: Conjunctivae are normal. Right eye exhibits no discharge. Left eye exhibits no discharge. No scleral icterus.  Neck: Normal range of motion. Neck supple. No JVD present. No tracheal deviation present. No thyromegaly present.  Cardiovascular: Normal rate, regular rhythm, normal heart sounds and intact distal pulses.  Exam reveals no gallop and no friction rub.   No murmur heard. Pulmonary/Chest: Effort normal and breath sounds normal. No stridor. No respiratory distress. He has no wheezes. He has no rales. He exhibits no tenderness.  Abdominal: Soft. Bowel sounds are normal. He exhibits no distension and no mass. There is no tenderness. There is no rebound and no guarding.  Musculoskeletal: Normal range of motion. He exhibits no edema.  Lymphadenopathy:    He has no cervical adenopathy.  Neurological: He is oriented to person, place, and time.  Skin: Skin is warm and dry. No rash noted. He is not diaphoretic. No erythema. No pallor.  Vitals reviewed.   Lab Results  Component Value Date   WBC 6.6 03/07/2015   HGB 13.9 03/07/2015   HCT 40.3 03/07/2015   PLT 226 03/07/2015   GLUCOSE 97 03/07/2015   CHOL 193 02/26/2015   TRIG 117.0 02/26/2015   HDL 36.30* 02/26/2015   LDLCALC 133* 02/26/2015   ALT 13 02/26/2015   AST 13 02/26/2015   NA 140 03/07/2015   K 3.4* 03/07/2015   CL 109 03/07/2015   CREATININE 0.84 03/07/2015   BUN 6 03/07/2015   CO2 24 03/07/2015   TSH 4.08 02/26/2015   PSA 0.17 02/26/2015   INR 1.0 04/15/2011   HGBA1C 5.1  04/15/2011    Dg Chest 2 View  03/06/2015  CLINICAL DATA:  Tachycardia and atrial fibrillation for 1 day EXAM: CHEST - 2 VIEW COMPARISON:  04/30/2009 FINDINGS: Cardiac shadow is within normal limits. The lungs are well aerated bilaterally. Parenchymal scarring is again seen in the right apex stable from the prior exam. No focal infiltrate or sizable effusion is noted. No bony abnormality is seen. IMPRESSION: No active disease. Electronically Signed   By: Inez Catalina M.D.   On: 03/06/2015 18:15    Assessment & Plan:   Jett was seen today for urinary frequency.  Diagnoses and all orders for this visit:  Other specified hypothyroidism -     levothyroxine (SYNTHROID, LEVOTHROID) 25 MCG tablet; TAKE 1 TABLET BY MOUTH EVERY DAY BEFORE BREAKFAST  OAB (overactive bladder)- he has discontinued Levbid, will continue Flomax, will add on  Myrbetriq for additional symptom relief. -     mirabegron ER (MYRBETRIQ) 50 MG TB24 tablet; Take 1 tablet (50 mg total) by mouth daily.   I have discontinued Mr. Murk amLODipine and metoprolol tartrate. I am also having him start on mirabegron ER. Additionally, I am having him maintain his Vitamins-Lipotropics (LIPOFLAVONOID PO), Multiple Vitamins-Minerals (PRESERVISION AREDS PO), apixaban, acyclovir, cycloSPORINE, tamsulosin, atorvastatin, finasteride, omeprazole, acetaminophen, cholecalciferol, lisinopril, flecainide, hyoscyamine, diltiazem, loratadine, and levothyroxine.  Meds ordered this encounter  Medications  . DISCONTD: metoprolol tartrate (LOPRESSOR) 25 MG tablet    Sig:   . loratadine (CLARITIN) 10 MG tablet    Sig: Take 10 mg by mouth daily.  Marland Kitchen levothyroxine (SYNTHROID, LEVOTHROID) 25 MCG tablet    Sig: TAKE 1 TABLET BY MOUTH EVERY DAY BEFORE BREAKFAST    Dispense:  90 tablet    Refill:  1    Pt needs to be given 90 day supply as requested  . mirabegron ER (MYRBETRIQ) 50 MG TB24 tablet    Sig: Take 1 tablet (50 mg total) by mouth daily.     Dispense:  30 tablet    Refill:  5     Follow-up: Return in about 3 months (around 07/15/2015).  [name], MD

## 2015-04-16 NOTE — Patient Instructions (Signed)
Overactive Bladder, Adult Overactive bladder is a group of urinary symptoms. With overactive bladder, you may suddenly feel the need to pass urine (urinate) right away. After feeling this sudden urge, you might also leak urine if you cannot get to the bathroom fast enough (urinary incontinence). These symptoms might interfere with your daily work or social activities. Overactive bladder symptoms may also wake you up at night. Overactive bladder affects the nerve signals between your bladder and your brain. Your bladder may get the signal to empty before it is full. Very sensitive muscles can also make your bladder squeeze too soon. CAUSES Many things can cause an overactive bladder. Possible causes include:  Urinary tract infection.  Infection of nearby tissues, such as the prostate.  Prostate enlargement.  Being pregnant with twins or more (multiples).  Surgery on the uterus or urethra.  Bladder stones, inflammation, or tumors.  Drinking too much caffeine or alcohol.  Certain medicines, especially those that you take to help your body get rid of extra fluid (diuretics) by increasing urine production.  Muscle or nerve weakness, especially from:  A spinal cord injury.  Stroke.  Multiple sclerosis.  Parkinson disease.  Diabetes. This can cause a high urine volume that fills the bladder so quickly that the normal urge to urinate is triggered very strongly.  Constipation. A buildup of too much stool can put pressure on your bladder. RISK FACTORS You may be at greater risk for overactive bladder if you:  Are an older adult.  Smoke.  Are going through menopause.  Have prostate problems.  Have a neurological disease, such as stroke, dementia, Parkinson disease, or multiple sclerosis (MS).  Eat or drink things that irritate the bladder. These include alcohol, spicy food, and caffeine.  Are overweight or obese. SIGNS AND SYMPTOMS  The signs and symptoms of an overactive  bladder include:  Sudden, strong urges to urinate.  Leaking urine.  Urinating eight or more times per day.  Waking up to urinate two or more times per night. DIAGNOSIS Your health care provider may suspect overactive bladder based on your symptoms. The health care provider will do a physical exam and take your medical history. Blood or urine tests may also be done. For example, you might need to have a bladder function test to check how well you can hold your urine. You might also need to see a health care provider who specializes in the urinary tract (urologist). TREATMENT Treatment for overactive bladder depends on the cause of your condition and whether it is mild or severe. Certain treatments can be done in your health care provider's office or clinic. You can also make lifestyle changes at home. Options include: Behavioral Treatments  Biofeedback. A specialist uses sensors to help you become aware of your body's signals.  Keeping a daily log of when you need to urinate and what happens after the urge. This may help you manage your condition.  Bladder training. This helps you learn to control the urge to urinate by following a schedule that directs you to urinate at regular intervals (timed voiding). At first, you might have to wait a few minutes after feeling the urge. In time, you should be able to schedule bathroom visits an hour or more apart.  Kegel exercises. These are exercises to strengthen the pelvic floor muscles, which support the bladder. Toning these muscles can help you control urination, even if your bladder muscles are overactive. A specialist will teach you how to do these exercises correctly. They   require daily practice.  Weight loss. If you are obese or overweight, losing weight might relieve your symptoms of overactive bladder. Talk to your health care provider about losing weight and whether there is a specific program or method that would work best for you.  Diet  change. This might help if constipation is making your overactive bladder worse. Your health care provider or a dietitian can explain ways to change what you eat to ease constipation. You might also need to consume less alcohol and caffeine or drink other fluids at different times of the day.  Stopping smoking.  Wearing pads to absorb leakage while you wait for other treatments to take effect. Physical Treatments  Electrical stimulation. Electrodes send gentle pulses of electricity to strengthen the nerves or muscles that help to control the bladder. Sometimes, the electrodes are placed outside of the body. In other cases, they might be placed inside the body (implanted). This treatment can take several months to have an effect.  Supportive devices. Women may need a plastic device that fits into the vagina and supports the bladder (pessary). Medicines Several medicines can help treat overactive bladder and are usually used along with other treatments. Some are injected into the muscles involved in urination. Others come in pill form. Your health care provider may prescribe:  Antispasmodics. These medicines block the signals that the nerves send to the bladder. This keeps the bladder from releasing urine at the wrong time.  Tricyclic antidepressants. These types of antidepressants also relax bladder muscles. Surgery  You may have a device implanted to help manage the nerve signals that indicate when you need to urinate.  You may have surgery to implant electrodes for electrical stimulation.  Sometimes, very severe cases of overactive bladder require surgery to change the shape of the bladder. HOME CARE INSTRUCTIONS   Take medicines only as directed by your health care provider.  Use any implants or a pessary as directed by your health care provider.  Make any diet or lifestyle changes that are recommended by your health care provider. These might include:  Drinking less fluid or  drinking at different times of the day. If you need to urinate often during the night, you may need to stop drinking fluids early in the evening.  Cutting down on caffeine or alcohol. Both can make an overactive bladder worse. Caffeine is found in coffee, tea, and sodas.  Doing Kegel exercises to strengthen muscles.  Losing weight if you need to.  Eating a healthy and balanced diet to prevent constipation.  Keep a journal or log to track how much and when you drink and also when you feel the need to urinate. This will help your health care provider to monitor your condition. SEEK MEDICAL CARE IF:  Your symptoms do not get better after treatment.  Your pain and discomfort are getting worse.  You have more frequent urges to urinate.  You have a fever. SEEK IMMEDIATE MEDICAL CARE IF: You are not able to control your bladder at all.   This information is not intended to replace advice given to you by your health care provider. Make sure you discuss any questions you have with your health care provider.   Document Released: 01/09/2009 Document Revised: 04/05/2014 Document Reviewed: 08/08/2013 Elsevier Interactive Patient Education 2016 [employer]  

## 2015-04-16 NOTE — Progress Notes (Signed)
Pre visit review using our clinic review tool, if applicable. No additional management support is needed unless otherwise documented below in the visit note. 

## 2015-04-17 ENCOUNTER — Telehealth: Payer: Self-pay | Admitting: *Deleted

## 2015-04-17 NOTE — Telephone Encounter (Signed)
Notified pt with md response. Pt states he will hold on the Myrbetriq, and start the hyoscyamine instead...Johny Chess

## 2015-04-17 NOTE — Telephone Encounter (Signed)
Received pt call he states that md rx Myrbetriq, and after reading md summary & pharmacy information on medication he states he have some concerns about taking med. He states that he does have urine frequency sxs, but not a sudden urgency. Also information stated if taking med " Flecainide" to not take because it can slow down process. Pt states he takes flecainide have been for a whoel. He did take first dose of the Myrbetriq yesterday afternoon still having the burning in the [place]. He is concern about taking this medication...Johny Chess

## 2015-04-17 NOTE — Telephone Encounter (Signed)
Yesterday he told me he was not having any discomfort. If he is having the penile discomfort again that he should restart the hyoscyamine. If he has concerns about Myrbetriq Tynisha leave it off and see how restarting the hyoscyamine works for him.

## 2015-04-21 ENCOUNTER — Other Ambulatory Visit: Payer: Self-pay | Admitting: *Deleted

## 2015-04-21 ENCOUNTER — Encounter: Payer: Self-pay | Admitting: Cardiology

## 2015-04-21 DIAGNOSIS — I4819 Other persistent atrial fibrillation: Secondary | ICD-10-CM

## 2015-04-21 DIAGNOSIS — Z01812 Encounter for preprocedural laboratory examination: Secondary | ICD-10-CM

## 2015-05-17 ENCOUNTER — Other Ambulatory Visit: Payer: Self-pay | Admitting: Internal Medicine

## 2015-05-19 ENCOUNTER — Other Ambulatory Visit (INDEPENDENT_AMBULATORY_CARE_PROVIDER_SITE_OTHER): Payer: Medicare Other | Admitting: *Deleted

## 2015-05-19 DIAGNOSIS — Z01812 Encounter for preprocedural laboratory examination: Secondary | ICD-10-CM

## 2015-05-19 DIAGNOSIS — I481 Persistent atrial fibrillation: Secondary | ICD-10-CM | POA: Diagnosis not present

## 2015-05-19 DIAGNOSIS — I4819 Other persistent atrial fibrillation: Secondary | ICD-10-CM

## 2015-05-19 LAB — CBC WITH DIFFERENTIAL/PLATELET
Basophils Absolute: 0 10*3/uL (ref 0.0–0.1)
Basophils Relative: 0 % (ref 0–1)
EOS ABS: 0.4 10*3/uL (ref 0.0–0.7)
EOS PCT: 6 % — AB (ref 0–5)
HEMATOCRIT: 42.6 % (ref 39.0–52.0)
Hemoglobin: 14.6 g/dL (ref 13.0–17.0)
LYMPHS ABS: 2.9 10*3/uL (ref 0.7–4.0)
LYMPHS PCT: 40 % (ref 12–46)
MCH: 30.2 pg (ref 26.0–34.0)
MCHC: 34.3 g/dL (ref 30.0–36.0)
MCV: 88.2 fL (ref 78.0–100.0)
MONO ABS: 0.7 10*3/uL (ref 0.1–1.0)
MPV: 9.5 fL (ref 8.6–12.4)
Monocytes Relative: 9 % (ref 3–12)
Neutro Abs: 3.3 10*3/uL (ref 1.7–7.7)
Neutrophils Relative %: 45 % (ref 43–77)
PLATELETS: 228 10*3/uL (ref 150–400)
RBC: 4.83 MIL/uL (ref 4.22–5.81)
RDW: 13 % (ref 11.5–15.5)
WBC: 7.3 10*3/uL (ref 4.0–10.5)

## 2015-05-19 LAB — BASIC METABOLIC PANEL
BUN: 10 mg/dL (ref 7–25)
CALCIUM: 9.5 mg/dL (ref 8.6–10.3)
CO2: 26 mmol/L (ref 20–31)
CREATININE: 0.89 mg/dL (ref 0.70–1.25)
Chloride: 102 mmol/L (ref 98–110)
Glucose, Bld: 101 mg/dL — ABNORMAL HIGH (ref 65–99)
Potassium: 4.1 mmol/L (ref 3.5–5.3)
Sodium: 138 mmol/L (ref 135–146)

## 2015-05-22 ENCOUNTER — Other Ambulatory Visit: Payer: Self-pay | Admitting: Pharmacist Clinician (PhC)/ Clinical Pharmacy Specialist

## 2015-05-22 MED ORDER — APIXABAN 5 MG PO TABS: 5.0000 mg | ORAL_TABLET | Freq: Two times a day (BID) | ORAL | Status: DC

## 2015-05-26 ENCOUNTER — Ambulatory Visit (HOSPITAL_BASED_OUTPATIENT_CLINIC_OR_DEPARTMENT_OTHER)
Admission: RE | Admit: 2015-05-26 | Discharge: 2015-05-26 | Disposition: A | Discharge status: Home / Self Care | Payer: Medicare Other | Source: Ambulatory Visit | Attending: Nurse Practitioner | Admitting: Nurse Practitioner

## 2015-05-26 ENCOUNTER — Ambulatory Visit (HOSPITAL_COMMUNITY)
Admission: RE | Admit: 2015-05-26 | Discharge: 2015-05-26 | Disposition: A | Discharge status: Home / Self Care | Payer: Medicare Other | Source: Ambulatory Visit | Attending: Cardiology | Admitting: Cardiology

## 2015-05-26 ENCOUNTER — Encounter (HOSPITAL_COMMUNITY): Payer: Self-pay | Admitting: Nurse Practitioner

## 2015-05-26 ENCOUNTER — Encounter (HOSPITAL_COMMUNITY): Payer: Self-pay

## 2015-05-26 VITALS — BP 112/62 | HR 61 | Ht 68.0 in | Wt 129.8 lb

## 2015-05-26 DIAGNOSIS — I481 Persistent atrial fibrillation: Secondary | ICD-10-CM

## 2015-05-26 DIAGNOSIS — I2510 Atherosclerotic heart disease of native coronary artery without angina pectoris: Secondary | ICD-10-CM | POA: Insufficient documentation

## 2015-05-26 DIAGNOSIS — R9431 Abnormal electrocardiogram [ECG] [EKG]: Secondary | ICD-10-CM | POA: Insufficient documentation

## 2015-05-26 DIAGNOSIS — I4819 Other persistent atrial fibrillation: Secondary | ICD-10-CM

## 2015-05-26 MED ORDER — NITROGLYCERIN 0.4 MG SL SUBL
SUBLINGUAL_TABLET | SUBLINGUAL | Status: AC
Start: 2015-05-26 — End: 2015-05-26
  Filled 2015-05-26: qty 1

## 2015-05-26 MED ORDER — IOHEXOL 350 MG/ML SOLN
80.0000 mL | Freq: Once | INTRAVENOUS | Status: AC | PRN
  Administered 2015-05-26: 80 mL via INTRAVENOUS

## 2015-05-26 MED ORDER — METOPROLOL TARTRATE 1 MG/ML IV SOLN
INTRAVENOUS | Status: AC
  Filled 2015-05-26: qty 5

## 2015-05-26 MED ORDER — NITROGLYCERIN 0.4 MG SL SUBL
0.4000 mg | SUBLINGUAL_TABLET | SUBLINGUAL | Status: DC | PRN
  Administered 2015-05-26: 0.4 mg via SUBLINGUAL

## 2015-05-26 MED ORDER — METOPROLOL TARTRATE 1 MG/ML IV SOLN
5.0000 mg | INTRAVENOUS | Status: DC | PRN
  Administered 2015-05-26: 5 mg via INTRAVENOUS

## 2015-05-26 NOTE — Progress Notes (Signed)
Patient ID: Roy Long, male   DOB: [birth year], 65 y.o.   MRN: [id number]    Primary Care Physician: [name], MD Referring Physician: [name] is a 65 y.o. male with a h/o persistent afib/flutter pending ablation 3/3 for f/u. He is in SR today, continues on flecainide, cardizem. Has had cardiac CT, labs, and is ready for procedure. I answered several questions re procedure for him.  Has not missed any doses of apixaban.  Today, he denies symptoms of palpitations, chest pain, shortness of breath, orthopnea, PND, lower extremity edema, dizziness, presyncope, syncope, or neurologic sequela. The patient is tolerating medications without difficulties and is otherwise without complaint today.   Past Medical History  Diagnosis Date  . Atrial fibrillation ([place])   . Hypertension   . Alcohol dependence ([place])   . GERD (gastroesophageal reflux disease)   . Age-related macular degeneration, dry, left eye   . Hypercholesterolemia dx'd 02/2015  . Squamous carcinoma ([facility])     hand, chest  . Basal cell carcinoma   . Kidney stones   . Family history of adverse reaction to anesthesia     "father had head injuries and dementia; everytime he was put under less of his mentation came back"  . Hypothyroidism   . Pneumonia ~ 2004  . Migraine aura without headache     "very rare now" (03/07/2015)  . Arthritis     "maybe a little bit in my left knee" (03/07/2015)  . Depression     hx   Past Surgical History  Procedure Laterality Date  . Anterior cervical decomp/discectomy fusion  04/2009  . Back surgery    . Inguinal hernia repair Bilateral 2004  . Cardioversion  06/2002  . Colonoscopy w/ polypectomy  02/2003  . Colonoscopy    . Cataract extraction w/ intraocular lens implant Left ~ 2012  . Mohs surgery Right     "temple"  . Cystoscopy w/ stone manipulation  1980's    "basketted it out; no stent"    Current Outpatient Prescriptions  Medication Sig Dispense Refill  .  acetaminophen (TYLENOL) 325 MG tablet Take 325 mg by mouth every 6 (six) hours as needed for mild pain, moderate pain or headache.    Marland Kitchen acyclovir (ZOVIRAX) 400 MG tablet TAKE 1 TABLET THREE TIMES A DAY 90 tablet 2  . apixaban (ELIQUIS) 5 MG TABS tablet Take 1 tablet (5 mg total) by mouth 2 (two) times daily. 180 tablet 1  . atorvastatin (LIPITOR) 20 MG tablet Take 1 tablet (20 mg total) by mouth daily. (Patient taking differently: Take 20 mg by mouth daily at 6 PM. ) 90 tablet 3  . cholecalciferol (VITAMIN D) 1000 UNITS tablet Take 1,000 Units by mouth daily.    . cycloSPORINE (RESTASIS) 0.05 % ophthalmic emulsion Place 1 drop into both eyes 2 (two) times daily.    Marland Kitchen diltiazem (CARDIZEM CD) 180 MG 24 hr capsule Take 1 capsule (180 mg total) by mouth daily. 30 capsule 6  . finasteride (PROSCAR) 5 MG tablet TAKE 1 TABLET BY MOUTH DAILY (Patient taking differently: Take 5 mg by mouth daily. ) 90 tablet 3  . flecainide (TAMBOCOR) 100 MG tablet Take 1 tablet (100 mg total) by mouth 2 (two) times daily. 60 tablet 6  . fluticasone (FLONASE) 50 MCG/ACT nasal spray Place 2 sprays into both nostrils daily.    Marland Kitchen levothyroxine (SYNTHROID, LEVOTHROID) 25 MCG tablet TAKE 1 TABLET BY MOUTH EVERY DAY BEFORE BREAKFAST 90  tablet 1  . lisinopril (PRINIVIL,ZESTRIL) 5 MG tablet Take 1 tablet (5 mg total) by mouth daily. 90 tablet 1  . mirabegron ER (MYRBETRIQ) 50 MG TB24 tablet Take 1 tablet (50 mg total) by mouth daily. (Patient taking differently: Take 50 mg by mouth daily. HOLD) 30 tablet 5  . Multiple Vitamins-Minerals (PRESERVISION AREDS PO) Take 1 mg by mouth 2 (two) times daily.     Marland Kitchen omeprazole (PRILOSEC) 20 MG capsule Take 20 mg by mouth daily.    . tamsulosin (FLOMAX) 0.4 MG CAPS capsule Take 1 capsule (0.4 mg total) by mouth daily. 90 capsule 3  . Vitamins-Lipotropics (LIPOFLAVONOID PO) Take 3 tablets by mouth daily at 12 noon.     . hyoscyamine (LEVBID) 0.375 MG 12 hr tablet Take 1 tablet (0.375 mg total) by  mouth 2 (two) times daily. (Patient not taking: Reported on 05/26/2015) 60 tablet 2  . loratadine (CLARITIN) 10 MG tablet Take 10 mg by mouth daily. Reported on 05/26/2015     No current facility-administered medications for this encounter.   Facility-Administered Medications Ordered in Other Encounters  Medication Dose Route Frequency Provider Last Rate Last Dose  . metoprolol (LOPRESSOR) 1 MG/ML injection           . metoprolol (LOPRESSOR) injection 5 mg  5 mg Intravenous Q5 min PRN Larey Dresser, MD   5 mg at 05/26/15 [id number]  . nitroGLYCERIN (NITROSTAT) 0.4 MG SL tablet           . nitroGLYCERIN (NITROSTAT) SL tablet 0.4 mg  0.4 mg Sublingual Q5 min PRN Larey Dresser, MD   0.4 mg at 05/26/15 [id number]    Allergies  Allergen Reactions  . Pneumococcal Vaccines     Social History   Social History  . Marital Status: Single    Spouse Name: N/A  . Number of Children: 0  . Years of Education: N/A   Occupational History  . band teacher     retired   Social History Main Topics  . Smoking status: Former Smoker -- 3.00 packs/day for 17 years    Types: Cigarettes    Quit date: 03/30/1983  . Smokeless tobacco: Never Used  . Alcohol Use: Yes     Comment: 03/07/2015 "recovering alcoholic; dry date [id number]"  . Drug Use: Yes    Special: Marijuana, LSD, Mescaline     Comment: 03/07/2015 "stopped all drugs back in the 1980's"  . Sexual Activity: Yes    Birth Control/ Protection: None   Other Topics Concern  . Not on file   Social History Narrative    Family History  Problem Relation Age of Onset  . Heart failure Mother   . Stroke Brother     X2  . Cancer Brother   . Heart disease Neg Hx   . Hyperlipidemia Neg Hx   . Hypertension Neg Hx   . Diabetes Neg Hx     ROS- All systems are reviewed and negative except as per the HPI above  Physical Exam: Filed Vitals:   05/26/15 1031  BP: 112/62  Pulse: 61  Height: 5\' 8"  (1.727 m)  Weight: 129 lb 12.8 oz (58.877 kg)    GEN-  The patient is well appearing, alert and oriented x 3 today.   Head- normocephalic, atraumatic Eyes-  Sclera clear, conjunctiva pink Ears- hearing intact Oropharynx- clear Neck- supple, no JVP Lymph- no cervical lymphadenopathy Lungs- Clear to ausculation bilaterally, normal work of breathing Heart- Regular rate and rhythm, no murmurs,  rubs or gallops, PMI not laterally displaced GI- soft, NT, ND, + BS Extremities- no clubbing, cyanosis, or edema MS- no significant deformity or atrophy Skin- no rash or lesion Psych- euthymic mood, full affect Neuro- strength and sensation are intact  EKG- SR at 61 bpm, PR int 206 ms, Qrs int 98 ms, qtc 420 ms, LAD Epic records reviewed  Assessment and Plan: 1. Persistent symptomatic afib Pending afib/flutter ablation 3/3 Questions answered Continue flecainide, cardizem Continue apixaban  F/u afib clinic, s/p ablation, as scheduled 4/5  Butch Penny C. Deverick Pruss, [facility] [address] [place], [place] [address] [phone]

## 2015-05-27 ENCOUNTER — Other Ambulatory Visit: Payer: Self-pay | Admitting: Pharmacist Clinician (PhC)/ Clinical Pharmacy Specialist

## 2015-05-27 MED ORDER — APIXABAN 5 MG PO TABS: 5.0000 mg | ORAL_TABLET | Freq: Two times a day (BID) | ORAL | Status: DC

## 2015-05-28 ENCOUNTER — Other Ambulatory Visit: Payer: Self-pay | Admitting: Pharmacist Clinician (PhC)/ Clinical Pharmacy Specialist

## 2015-05-28 MED ORDER — APIXABAN 5 MG PO TABS: 5.0000 mg | ORAL_TABLET | Freq: Two times a day (BID) | ORAL | Status: DC

## 2015-05-29 ENCOUNTER — Other Ambulatory Visit: Payer: Self-pay | Admitting: Physician Assistant

## 2015-05-30 ENCOUNTER — Encounter (HOSPITAL_COMMUNITY): Payer: Self-pay | Admitting: General Practice

## 2015-05-30 ENCOUNTER — Ambulatory Visit (HOSPITAL_COMMUNITY): Payer: Medicare Other | Admitting: Certified Registered"

## 2015-05-30 ENCOUNTER — Encounter (HOSPITAL_COMMUNITY)
Admission: RE | Disposition: A | Discharge status: Home / Self Care | Payer: Self-pay | Source: Ambulatory Visit | Attending: Cardiology

## 2015-05-30 ENCOUNTER — Ambulatory Visit (HOSPITAL_COMMUNITY)
Admission: RE | Admit: 2015-05-30 | Discharge: 2015-05-31 | Disposition: A | Discharge status: Home / Self Care | Payer: Medicare Other | Source: Ambulatory Visit | Attending: Cardiology | Admitting: Cardiology

## 2015-05-30 DIAGNOSIS — K219 Gastro-esophageal reflux disease without esophagitis: Secondary | ICD-10-CM | POA: Diagnosis not present

## 2015-05-30 DIAGNOSIS — I10 Essential (primary) hypertension: Secondary | ICD-10-CM | POA: Insufficient documentation

## 2015-05-30 DIAGNOSIS — I481 Persistent atrial fibrillation: Secondary | ICD-10-CM | POA: Diagnosis not present

## 2015-05-30 DIAGNOSIS — E785 Hyperlipidemia, unspecified: Secondary | ICD-10-CM | POA: Diagnosis not present

## 2015-05-30 DIAGNOSIS — E039 Hypothyroidism, unspecified: Secondary | ICD-10-CM | POA: Diagnosis not present

## 2015-05-30 DIAGNOSIS — I2510 Atherosclerotic heart disease of native coronary artery without angina pectoris: Secondary | ICD-10-CM | POA: Diagnosis not present

## 2015-05-30 DIAGNOSIS — I483 Typical atrial flutter: Secondary | ICD-10-CM | POA: Diagnosis not present

## 2015-05-30 DIAGNOSIS — I4891 Unspecified atrial fibrillation: Secondary | ICD-10-CM | POA: Diagnosis present

## 2015-05-30 DIAGNOSIS — I480 Paroxysmal atrial fibrillation: Secondary | ICD-10-CM | POA: Diagnosis not present

## 2015-05-30 HISTORY — PX: ATRIAL FIBRILLATION ABLATION: SHX5732

## 2015-05-30 HISTORY — PX: ELECTROPHYSIOLOGIC STUDY: SHX172A

## 2015-05-30 LAB — POCT ACTIVATED CLOTTING TIME
ACTIVATED CLOTTING TIME: 312 s
ACTIVATED CLOTTING TIME: 152 s
Activated Clotting Time: 296 seconds
Activated Clotting Time: 317 seconds

## 2015-05-30 SURGERY — ATRIAL FIBRILLATION ABLATION
Anesthesia: General

## 2015-05-30 MED ORDER — LEVOTHYROXINE SODIUM 25 MCG PO TABS
25.0000 ug | ORAL_TABLET | Freq: Every day | ORAL | Status: DC
  Filled 2015-05-30: qty 1

## 2015-05-30 MED ORDER — FENTANYL CITRATE (PF) 100 MCG/2ML IJ SOLN
INTRAMUSCULAR | Status: DC | PRN
  Administered 2015-05-30 (×3): 50 ug via INTRAVENOUS

## 2015-05-30 MED ORDER — FINASTERIDE 5 MG PO TABS
5.0000 mg | ORAL_TABLET | Freq: Every day | ORAL | Status: DC
  Administered 2015-05-30: 5 mg via ORAL
  Filled 2015-05-30 (×4): qty 1

## 2015-05-30 MED ORDER — LISINOPRIL 5 MG PO TABS
5.0000 mg | ORAL_TABLET | Freq: Every day | ORAL | Status: DC
  Administered 2015-05-30: 20:00:00 5 mg via ORAL
  Filled 2015-05-30 (×2): qty 1

## 2015-05-30 MED ORDER — SODIUM CHLORIDE 0.9% FLUSH: 3.0000 mL | INTRAVENOUS | Status: DC | PRN

## 2015-05-30 MED ORDER — LORATADINE 10 MG PO TABS: 10.0000 mg | ORAL_TABLET | Freq: Every day | ORAL | Status: DC

## 2015-05-30 MED ORDER — MIDAZOLAM HCL 5 MG/5ML IJ SOLN
INTRAMUSCULAR | Status: DC | PRN
  Administered 2015-05-30: 2 mg via INTRAVENOUS

## 2015-05-30 MED ORDER — HEPARIN (PORCINE) IN NACL 2-0.9 UNIT/ML-% IJ SOLN
INTRAMUSCULAR | Status: DC | PRN
  Administered 2015-05-30: 08:00:00

## 2015-05-30 MED ORDER — ACETAMINOPHEN 325 MG PO TABS: 650.0000 mg | ORAL_TABLET | ORAL | Status: DC | PRN

## 2015-05-30 MED ORDER — HYPROMELLOSE (GONIOSCOPIC) 2.5 % OP SOLN
1.0000 [drp] | Freq: Every day | OPHTHALMIC | Status: DC | PRN
  Filled 2015-05-30: qty 15

## 2015-05-30 MED ORDER — ONDANSETRON HCL 4 MG/2ML IJ SOLN
INTRAMUSCULAR | Status: DC | PRN
  Administered 2015-05-30: 4 mg via INTRAVENOUS

## 2015-05-30 MED ORDER — BUPIVACAINE HCL (PF) 0.25 % IJ SOLN
INTRAMUSCULAR | Status: AC
  Filled 2015-05-30: qty 30

## 2015-05-30 MED ORDER — ACYCLOVIR 400 MG PO TABS
400.0000 mg | ORAL_TABLET | Freq: Every day | ORAL | Status: DC
  Administered 2015-05-30: 22:00:00 400 mg via ORAL
  Filled 2015-05-30 (×3): qty 1

## 2015-05-30 MED ORDER — SODIUM CHLORIDE 0.9% FLUSH: 3.0000 mL | Freq: Two times a day (BID) | INTRAVENOUS | Status: DC

## 2015-05-30 MED ORDER — DILTIAZEM HCL ER COATED BEADS 180 MG PO CP24
180.0000 mg | ORAL_CAPSULE | Freq: Every day | ORAL | Status: DC
  Administered 2015-05-30: 180 mg via ORAL
  Filled 2015-05-30: qty 1

## 2015-05-30 MED ORDER — NEOSTIGMINE METHYLSULFATE 10 MG/10ML IV SOLN
INTRAVENOUS | Status: DC | PRN
  Administered 2015-05-30: 4 mg via INTRAVENOUS

## 2015-05-30 MED ORDER — SUCCINYLCHOLINE CHLORIDE 20 MG/ML IJ SOLN
INTRAMUSCULAR | Status: DC | PRN
  Administered 2015-05-30: 100 mg via INTRAVENOUS

## 2015-05-30 MED ORDER — APIXABAN 5 MG PO TABS
5.0000 mg | ORAL_TABLET | Freq: Two times a day (BID) | ORAL | Status: DC
  Administered 2015-05-30: 22:00:00 5 mg via ORAL
  Filled 2015-05-30: qty 1

## 2015-05-30 MED ORDER — HEPARIN (PORCINE) IN NACL 2-0.9 UNIT/ML-% IJ SOLN
INTRAMUSCULAR | Status: AC
  Filled 2015-05-30: qty 500

## 2015-05-30 MED ORDER — VITAMIN D3 25 MCG (1000 UNIT) PO TABS
1000.0000 [IU] | ORAL_TABLET | Freq: Every day | ORAL | Status: DC
Start: 2015-05-31 — End: 2015-05-31
  Filled 2015-05-30 (×2): qty 1

## 2015-05-30 MED ORDER — FLUTICASONE PROPIONATE 50 MCG/ACT NA SUSP
1.0000 | Freq: Every day | NASAL | Status: DC
  Filled 2015-05-30: qty 16

## 2015-05-30 MED ORDER — DEXTROSE 5 % IV SOLN
10.0000 mg | INTRAVENOUS | Status: DC | PRN
  Administered 2015-05-30: 25 ug/min via INTRAVENOUS

## 2015-05-30 MED ORDER — ONDANSETRON HCL 4 MG/2ML IJ SOLN: 4.0000 mg | Freq: Four times a day (QID) | INTRAMUSCULAR | Status: DC | PRN

## 2015-05-30 MED ORDER — GLYCOPYRROLATE 0.2 MG/ML IJ SOLN
INTRAMUSCULAR | Status: DC | PRN
  Administered 2015-05-30: 0.6 mg via INTRAVENOUS

## 2015-05-30 MED ORDER — TAMSULOSIN HCL 0.4 MG PO CAPS
0.4000 mg | ORAL_CAPSULE | Freq: Every day | ORAL | Status: DC
  Administered 2015-05-30: 20:00:00 0.4 mg via ORAL
  Filled 2015-05-30: qty 1

## 2015-05-30 MED ORDER — ATORVASTATIN CALCIUM 20 MG PO TABS: 20.0000 mg | ORAL_TABLET | Freq: Every day | ORAL | Status: DC

## 2015-05-30 MED ORDER — LIDOCAINE HCL (CARDIAC) 20 MG/ML IV SOLN
INTRAVENOUS | Status: DC | PRN
  Administered 2015-05-30: 80 mg via INTRAVENOUS
  Administered 2015-05-30: 20 mg via INTRAVENOUS

## 2015-05-30 MED ORDER — ROCURONIUM BROMIDE 100 MG/10ML IV SOLN
INTRAVENOUS | Status: DC | PRN
Start: 2015-05-30 — End: 2015-05-30
  Administered 2015-05-30: 10 mg via INTRAVENOUS
  Administered 2015-05-30: 20 mg via INTRAVENOUS
  Administered 2015-05-30: 30 mg via INTRAVENOUS

## 2015-05-30 MED ORDER — BUPIVACAINE HCL (PF) 0.25 % IJ SOLN
INTRAMUSCULAR | Status: DC | PRN
  Administered 2015-05-30: 15 mL

## 2015-05-30 MED ORDER — SODIUM CHLORIDE 0.9 % IV SOLN: 250.0000 mL | INTRAVENOUS | Status: DC | PRN

## 2015-05-30 MED ORDER — HYOSCYAMINE SULFATE ER 0.375 MG PO TB12
0.3750 mg | ORAL_TABLET | Freq: Two times a day (BID) | ORAL | Status: DC | PRN
  Filled 2015-05-30: qty 1

## 2015-05-30 MED ORDER — HEPARIN SODIUM (PORCINE) 1000 UNIT/ML IJ SOLN
INTRAMUSCULAR | Status: DC | PRN
  Administered 2015-05-30: 12000 [IU] via INTRAVENOUS
  Administered 2015-05-30: 1000 [IU] via INTRAVENOUS
  Administered 2015-05-30: 2000 [IU] via INTRAVENOUS

## 2015-05-30 MED ORDER — PROTAMINE SULFATE 10 MG/ML IV SOLN
INTRAVENOUS | Status: DC | PRN
  Administered 2015-05-30 (×2): 10 mg via INTRAVENOUS
  Administered 2015-05-30: 20 mg via INTRAVENOUS

## 2015-05-30 MED ORDER — LACTATED RINGERS IV SOLN
INTRAVENOUS | Status: DC | PRN
  Administered 2015-05-30 (×2): via INTRAVENOUS

## 2015-05-30 MED ORDER — HEPARIN SODIUM (PORCINE) 1000 UNIT/ML IJ SOLN
INTRAMUSCULAR | Status: AC
  Filled 2015-05-30: qty 1

## 2015-05-30 MED ORDER — PROPOFOL 10 MG/ML IV BOLUS
INTRAVENOUS | Status: DC | PRN
  Administered 2015-05-30: 150 mg via INTRAVENOUS

## 2015-05-30 MED ORDER — PANTOPRAZOLE SODIUM 40 MG PO TBEC
40.0000 mg | DELAYED_RELEASE_TABLET | Freq: Every day | ORAL | Status: DC
  Administered 2015-05-30: 20:00:00 40 mg via ORAL
  Filled 2015-05-30: qty 1

## 2015-05-30 MED ORDER — OFF THE BEAT BOOK
Freq: Once | Status: AC
  Administered 2015-05-30: 21:00:00
  Filled 2015-05-30: qty 1

## 2015-05-30 MED ORDER — ATORVASTATIN CALCIUM 80 MG PO TABS
80.0000 mg | ORAL_TABLET | Freq: Every day | ORAL | Status: DC
  Administered 2015-05-30: 80 mg via ORAL
  Filled 2015-05-30: qty 1

## 2015-05-30 MED ORDER — HEPARIN SODIUM (PORCINE) 1000 UNIT/ML IJ SOLN
INTRAMUSCULAR | Status: DC | PRN
  Administered 2015-05-30: 1000 [IU] via INTRAVENOUS

## 2015-05-30 SURGICAL SUPPLY — 21 items
BAG SNAP BAND KOVER 36X36 (MISCELLANEOUS) ×2 IMPLANT
BLANKET WARM UNDERBOD FULL ACC (MISCELLANEOUS) ×2 IMPLANT
CATH NAVISTAR SMARTTOUCH DF (ABLATOR) ×2 IMPLANT
CATH SOUNDSTAR ECO REPROCESSED (CATHETERS) ×2 IMPLANT
CATH VARIABLE LASSO NAV 2515 (CATHETERS) ×2 IMPLANT
CATH WEBSTER BI DIR CS D-F CRV (CATHETERS) ×2 IMPLANT
COVER SWIFTLINK CONNECTOR (BAG) ×2 IMPLANT
NEEDLE TRANSSEPTAL BRK 98CM (NEEDLE) ×2 IMPLANT
PACK EP LATEX FREE (CUSTOM PROCEDURE TRAY) ×1
PACK EP LF (CUSTOM PROCEDURE TRAY) ×1 IMPLANT
PAD DEFIB LIFELINK (PAD) ×2 IMPLANT
PATCH CARTO3 (PAD) ×2 IMPLANT
SHEATH AGILIS NXT 8.5F 71CM (SHEATH) ×4 IMPLANT
SHEATH AVANTI 11F 11CM (SHEATH) ×2 IMPLANT
SHEATH PINNACLE 7F 10CM (SHEATH) ×2 IMPLANT
SHEATH PINNACLE 8F 10CM (SHEATH) ×4 IMPLANT
SHEATH PINNACLE 9F 10CM (SHEATH) ×4 IMPLANT
SHIELD RADPAD SCOOP 12X17 (MISCELLANEOUS) ×2 IMPLANT
TUBING ART PRESS 72  MALE/FEM (TUBING) ×2
TUBING ART PRESS 72 MALE/FEM (TUBING) ×2 IMPLANT
TUBING SMART ABLATE COOLFLOW (TUBING) ×2 IMPLANT

## 2015-05-30 NOTE — Progress Notes (Addendum)
Site area: Rt fem venous sheaths  [id number] Site Prior to Removal:  Level 0 Pressure Applied For: 30 min Manual:   Manual hold Patient Status During Pull:  A/O Post Pull Site:  Level 0 Post Pull Instructions Given:  Post instructions give for sheath removal. Pt understands insructions Post Pull Pulses Present: n/a Dressing Applied:  tegaderm and 4x4 Bedrest begins @ 13:10:00 Comments:

## 2015-05-30 NOTE — Discharge Summary (Signed)
ELECTROPHYSIOLOGY PROCEDURE DISCHARGE SUMMARY    Patient ID: Roy Long,  MRN: [id number], DOB/AGE: [birth year] 65 y.o.  Admit date: 05/30/2015 Discharge date: 05/31/15  Primary Care Physician: [name], MD Primary Cardiologist: Dr. Martinique Electrophysiologist: Dr. [name]  Primary Discharge Diagnosis:  Persistent AFib CHA2DS2Vasc is at least 3 on Eliquis (age > 40, hypertension, CAD seen on cardiac CT)  Secondary Discharge Diagnosis:  1. HTN 2. GERD 3. HLD 4. Hypothyroidism 5. CAD seen on cardiac CT- atorvastatin increased to 80mg  daily  Procedures This Admission:  1.  Electrophysiology study and radiofrequency catheter ablation on 05/30/15 by Dr [name].   CONCLUSIONS: 1. Sinus rhythm upon presentation.  2. Successful electrical isolation and anatomical encircling of all four pulmonary veins with radiofrequency current.  3. Cavo-tricuspid isthmus ablation was performed with complete bidirectional isthmus block achieved.  4. No early apparent complications.  Brief HPI: Roy EVARISTO is a 65 y.o. male with a history of persistent atrial fibrillation.  They have failed medical therapy with Flecainide.   Risks, benefits, and alternatives to catheter ablation of atrial fibrillation were reviewed with the patient who wished to proceed.  The patient underwent CT coronary/cardiac prior to the procedure which demonstrated no LAA thrombus.    Hospital Course:  The patient was admitted and underwent EPS/RFCA of atrial fibrillation with details as outlined above.  They were monitored on telemetry overnight which demonstrated NSR.  Groin was without complication on the day of discharge.  The patient was examined by Dr. Lovena Le and considered to be stable for discharge.  Wound care and restrictions were reviewed with the patient.  The patient will be seen back by Roderic Palau, NP in 4 weeks and Dr [name] in 12 weeks for post ablation follow up. These visits have been arranged.   The coronary  CT did show moderate CAD, will up-titrate his statin therapy.   Physical Exam: Filed Vitals:   05/30/15 1927 05/30/15 2000 05/31/15 0424 05/31/15 0745  BP: 129/55  121/53 120/58  Pulse: 74 77 81 95  Temp: 98 F (36.7 C)  98 F (36.7 C) 98.3 F (36.8 C)  TempSrc: Oral  Oral Oral  Resp: 21 21 15 12   Height:      Weight:   130 lb 1.1 oz (59 kg)   SpO2: 100% 100% 100% 100%    GEN- The patient is well appearing, alert and oriented x 3 today.   HEENT: normocephalic, atraumatic; sclera clear, conjunctiva pink; hearing intact; oropharynx clear; neck supple  Lungs- Clear to ausculation bilaterally, normal work of breathing.  No wheezes, rales, rhonchi Heart- Regular rate and rhythm, no murmurs, rubs or gallops  GI- soft, non-tender, non-distended, bowel sounds present  Extremities- no clubbing, cyanosis, or edema; DP/PT/radial pulses 2+ bilaterally, groin without hematoma/bruit MS- no significant deformity or atrophy Skin- warm and dry, no rash or lesion Psych- euthymic mood, full affect Neuro- strength and sensation are intact   Labs:   Lab Results  Component Value Date   WBC 7.3 05/19/2015   HGB 14.6 05/19/2015   HCT 42.6 05/19/2015   MCV 88.2 05/19/2015   PLT 228 05/19/2015   No results for input(s): NA, K, CL, CO2, BUN, CREATININE, CALCIUM, PROT, BILITOT, ALKPHOS, ALT, AST, GLUCOSE in the last 168 hours.  Invalid input(s): LABALBU   Discharge Medications:    Medication List    STOP taking these medications        flecainide 100 MG tablet  Commonly known as:  TAMBOCOR  TAKE these medications        acetaminophen 325 MG tablet  Commonly known as:  TYLENOL  Take 325 mg by mouth 3 (three) times daily as needed for mild pain, moderate pain or headache.     acyclovir 400 MG tablet  Commonly known as:  ZOVIRAX  TAKE 1 TABLET THREE TIMES A DAY     apixaban 5 MG Tabs tablet  Commonly known as:  ELIQUIS  Take 1 tablet (5 mg total) by mouth 2 (two)  times daily.     atorvastatin 80 MG tablet  Commonly known as:  LIPITOR  Take 1 tablet (80 mg total) by mouth daily.  Notes to Patient:  Cholesterol      cholecalciferol 1000 units tablet  Commonly known as:  VITAMIN D  Take 1,000 Units by mouth daily.     cycloSPORINE 0.05 % ophthalmic emulsion  Commonly known as:  RESTASIS  Place 1 drop into both eyes 2 (two) times daily.     diltiazem 180 MG 24 hr capsule  Commonly known as:  CARDIZEM CD  Take 1 capsule (180 mg total) by mouth daily.     finasteride 5 MG tablet  Commonly known as:  PROSCAR  TAKE 1 TABLET BY MOUTH DAILY     fluticasone 50 MCG/ACT nasal spray  Commonly known as:  FLONASE  Place 1 spray into both nostrils daily.     hydroxypropyl methylcellulose / hypromellose 2.5 % ophthalmic solution  Commonly known as:  ISOPTO TEARS / GONIOVISC  Place 1 drop into both eyes daily as needed for dry eyes.     hyoscyamine 0.375 MG 12 hr tablet  Commonly known as:  LEVBID  Take 1 tablet (0.375 mg total) by mouth 2 (two) times daily.     levothyroxine 25 MCG tablet  Commonly known as:  SYNTHROID, LEVOTHROID  TAKE 1 TABLET BY MOUTH EVERY DAY BEFORE BREAKFAST     LIPOFLAVONOID PO  Take 1 tablet by mouth 3 (three) times daily.     lisinopril 5 MG tablet  Commonly known as:  PRINIVIL,ZESTRIL  Take 1 tablet (5 mg total) by mouth daily.     loratadine 10 MG tablet  Commonly known as:  CLARITIN  Take 10 mg by mouth daily. Reported on 05/26/2015     mirabegron ER 50 MG Tb24 tablet  Commonly known as:  MYRBETRIQ  Take 1 tablet (50 mg total) by mouth daily.     omeprazole 20 MG capsule  Commonly known as:  PRILOSEC  Take 20 mg by mouth daily.     PRESERVISION AREDS PO  Take 1 mg by mouth 2 (two) times daily.     tamsulosin 0.4 MG Caps capsule  Commonly known as:  FLOMAX  Take 1 capsule (0.4 mg total) by mouth daily.        Disposition: Home  Follow-up Information    Follow up with [facility] On 07/02/2015.   Specialty:  Cardiology   Why:  11:00AM   Contact information:   [id number] [place] [phone]      Follow up with Will Meredith Leeds, MD On 09/03/2015.   Specialty:  Cardiology   Why:  11:00AM   Contact information:   [place] [address] [phone]       Duration of Discharge Encounter: Greater than 30 minutes including physician time.  Mable Fill PA-C   05/31/2015 8:44  AM  EP Attending  Patient seen and examined. Agree with the findings as above. With a h/o CAD, will plan to stop his flecainide. Followup as outlined by Dr. [name]. He has had very mild pleuritic chest pain after ablation. Exam does not demonstrate a friction rub and no JVD. I recommended tylenol or judicious use of NSAIDS.  Mikle Bosworth.D.

## 2015-05-30 NOTE — H&P (Signed)
Roy Long presents to the EP lab for treatment of his atrial fibrillation.  He has tried flecainide in the past but continues to have issues with palpitaitons.  Plan is for ablation of atrial fibrillation.  [name] plan for PVI. Explained the risks of the procedure including bleeding, tamponade, stroke, damage to surrounding organs.  Patient understands these risks and has agreed to the procedure.    [name], MD 05/30/2015 7:14 AM

## 2015-05-30 NOTE — Discharge Instructions (Signed)
No driving for 4 days. No lifting over 5 lbs for 1 week. No sexual activity for 1 week. You may return to work on 06/06/15. Keep procedure site clean & dry. If you notice increased pain, swelling, bleeding or pus, call/return!  You may shower, but no soaking baths/hot tubs/pools for 1 week.       You have an appointment set up with the [facility].  Multiple studies have shown that being followed by a dedicated atrial fibrillation clinic in addition to the standard care you receive from your other physicians improves health. We believe that enrollment in the atrial fibrillation clinic will allow [place] to better care for you.   The phone number to the [facility] is [phone]. The clinic is staffed Monday through Friday from 8:30am to 5pm.  Parking Directions: The clinic is located in the Heart and Vascular Building connected to [facility] 1)From [address] turn on to [address] and go to the 3rd entrance  (Heart and Vascular entrance) on the right. 2)Look to the right for Heart &Vascular Parking Garage. 3)A code for the entrance is required please call the clinic to receive this.   4)Take the elevators to the 1st floor. Registration is in the room with the glass walls at the end of the hallway.  If you have any trouble parking or locating the clinic, please dont hesitate to call [phone].

## 2015-05-30 NOTE — Anesthesia Procedure Notes (Signed)
Procedure Name: Intubation Date/Time: 05/30/2015 8:12 AM Performed by: Lavell Luster Pre-anesthesia Checklist: Patient identified, Emergency Drugs available, Suction available, Patient being monitored and Timeout performed Patient Re-evaluated:Patient Re-evaluated prior to inductionOxygen Delivery Method: Circle system utilized Preoxygenation: Pre-oxygenation with 100% oxygen Intubation Type: IV induction Ventilation: Mask ventilation without difficulty Laryngoscope Size: Mac and 3 Grade View: Grade I Tube type: Oral Tube size: 7.5 mm Number of attempts: 1 Airway Equipment and Method: Stylet Placement Confirmation: ETT inserted through vocal cords under direct vision,  positive ETCO2 and breath sounds checked- equal and bilateral Secured at: 22 cm Tube secured with: Tape Dental Injury: Teeth and Oropharynx as per pre-operative assessment

## 2015-05-30 NOTE — Anesthesia Preprocedure Evaluation (Addendum)
Anesthesia Evaluation  Patient identified by MRN, date of birth, ID band Patient awake    Reviewed: Allergy & Precautions, NPO status , Patient's Chart, lab work & pertinent test results  History of Anesthesia Complications (+) Family history of anesthesia reaction  Airway Mallampati: II  TM Distance: >3 FB     Dental  (+) Teeth Intact, Dental Advisory Given   Pulmonary neg shortness of breath, neg sleep apnea, pneumonia, resolved, neg COPD, neg recent URI, former smoker,    breath sounds clear to auscultation       Cardiovascular hypertension, Pt. on medications and Pt. on home beta blockers + dysrhythmias Atrial Fibrillation  Rhythm:Irregular     Neuro/Psych  Headaches, PSYCHIATRIC DISORDERS Depression  Neuromuscular disease    GI/Hepatic GERD  Medicated,(+)     substance abuse  alcohol use,   Endo/Other  Hypothyroidism   Renal/GU Renal disease     Musculoskeletal  (+) Arthritis ,   Abdominal (+)  Abdomen: soft. Bowel sounds: normal.  Peds  Hematology   Anesthesia Other Findings   Reproductive/Obstetrics                          Anesthesia Physical Anesthesia Plan  ASA: III  Anesthesia Plan: General   Post-op Pain Management:    Induction: Intravenous  Airway Management Planned: Oral ETT  Additional Equipment: None  Intra-op Plan:   Post-operative Plan: Extubation in OR  Informed Consent: I have reviewed the patients History and Physical, chart, labs and discussed the procedure including the risks, benefits and alternatives for the proposed anesthesia with the patient or authorized representative who has indicated his/her understanding and acceptance.   Dental advisory given  Plan Discussed with: CRNA and Surgeon  Anesthesia Plan Comments:         Anesthesia Quick Evaluation

## 2015-05-30 NOTE — Progress Notes (Signed)
IV saline locked. 

## 2015-05-30 NOTE — Transfer of Care (Signed)
Immediate Anesthesia Transfer of Care Note  Patient: Roy Long  Procedure(s) Performed: Procedure(s): Atrial Fibrillation Ablation (N/A)  Patient Location: Cath Lab  Anesthesia Type:General  Level of Consciousness: awake, alert  and oriented  Airway & Oxygen Therapy: Patient connected to face mask oxygen  Post-op Assessment: Report given to RN  Post vital signs: stable  Last Vitals:  Filed Vitals:   05/30/15 0532 05/30/15 1155  BP: 131/54 124/55  Pulse: 63 76  Temp: 36.4 C   Resp: 18 10    Complications: No apparent anesthesia complications

## 2015-05-30 NOTE — Progress Notes (Signed)
Patient to be discharged on Eliquis for anticoagulation.  Risk factors include age > 65, hypertension, CAD.  Also Roy Long increase atorvastatin to 80 mg due to CAD on cardiac CTA.  This patients CHA2DS2-VASc Score and unadjusted Ischemic Stroke Rate (% per year) is equal to 3.2 % stroke rate/year from a score of 3  Above score calculated as 1 point each if present [CHF, HTN, DM, Vascular=MI/PAD/Aortic Plaque, Age if 65-74, or Male] Above score calculated as 2 points each if present [Age > 75, or Stroke/TIA/TE]   Roy Mergenthaler [name], MD 05/30/2015 3:10 PM

## 2015-05-31 DIAGNOSIS — I481 Persistent atrial fibrillation: Secondary | ICD-10-CM | POA: Diagnosis not present

## 2015-05-31 DIAGNOSIS — E785 Hyperlipidemia, unspecified: Secondary | ICD-10-CM | POA: Diagnosis not present

## 2015-05-31 DIAGNOSIS — I10 Essential (primary) hypertension: Secondary | ICD-10-CM | POA: Diagnosis not present

## 2015-05-31 DIAGNOSIS — K219 Gastro-esophageal reflux disease without esophagitis: Secondary | ICD-10-CM | POA: Diagnosis not present

## 2015-05-31 DIAGNOSIS — I480 Paroxysmal atrial fibrillation: Secondary | ICD-10-CM | POA: Diagnosis not present

## 2015-05-31 MED ORDER — ATORVASTATIN CALCIUM 80 MG PO TABS: 80.0000 mg | ORAL_TABLET | Freq: Every day | ORAL | Status: DC

## 2015-06-01 NOTE — Anesthesia Postprocedure Evaluation (Signed)
Anesthesia Post Note  Patient: Roy Long  Procedure(s) Performed: Procedure(s) (LRB): Atrial Fibrillation Ablation (N/A)  Patient location during evaluation: PACU Anesthesia Type: General Level of consciousness: awake Pain management: pain level controlled Vital Signs Assessment: post-procedure vital signs reviewed and stable Respiratory status: spontaneous breathing and respiratory function stable Cardiovascular status: stable Postop Assessment: no signs of nausea or vomiting Anesthetic complications: no    Last Vitals:  Filed Vitals:   05/31/15 0424 05/31/15 0745  BP: 121/53 120/58  Pulse: 81 95  Temp: 36.7 C 36.8 C  Resp: 15 12    Last Pain: There were no vitals filed for this visit.               Darnell Jeschke

## 2015-06-02 ENCOUNTER — Telehealth: Payer: Self-pay | Admitting: Cardiology

## 2015-06-02 ENCOUNTER — Encounter (HOSPITAL_COMMUNITY): Payer: Self-pay | Admitting: Cardiology

## 2015-06-02 ENCOUNTER — Telehealth: Payer: Self-pay | Admitting: *Deleted

## 2015-06-02 NOTE — Telephone Encounter (Signed)
Pt was on TCM list admitted for A-Fib. D/C 05/31/15 will be f/u with cardiologist A-fib clinic...Johny Chess

## 2015-06-02 NOTE — Telephone Encounter (Signed)
Seemed to have some mild discoloration of the access site with his ablation.  No other symptoms.  No bleeding, fevers or discharge.  Told him we would be happy to look at it in the office if he had concerns.

## 2015-06-05 ENCOUNTER — Ambulatory Visit: Payer: Medicare Other | Admitting: Cardiology

## 2015-06-16 ENCOUNTER — Ambulatory Visit: Payer: Medicare Other | Admitting: Cardiology

## 2015-06-26 ENCOUNTER — Other Ambulatory Visit (INDEPENDENT_AMBULATORY_CARE_PROVIDER_SITE_OTHER): Payer: Medicare Other

## 2015-06-26 ENCOUNTER — Ambulatory Visit (INDEPENDENT_AMBULATORY_CARE_PROVIDER_SITE_OTHER): Payer: Medicare Other | Admitting: Internal Medicine

## 2015-06-26 ENCOUNTER — Encounter: Payer: Self-pay | Admitting: Internal Medicine

## 2015-06-26 ENCOUNTER — Encounter: Payer: Self-pay | Admitting: Gastroenterology

## 2015-06-26 VITALS — BP 110/64 | HR 81 | Temp 97.8°F | Resp 16 | Ht 68.0 in | Wt 125.0 lb

## 2015-06-26 DIAGNOSIS — I2510 Atherosclerotic heart disease of native coronary artery without angina pectoris: Secondary | ICD-10-CM

## 2015-06-26 DIAGNOSIS — E038 Other specified hypothyroidism: Secondary | ICD-10-CM | POA: Diagnosis not present

## 2015-06-26 DIAGNOSIS — I158 Other secondary hypertension: Secondary | ICD-10-CM | POA: Diagnosis not present

## 2015-06-26 DIAGNOSIS — R1310 Dysphagia, unspecified: Secondary | ICD-10-CM

## 2015-06-26 DIAGNOSIS — E785 Hyperlipidemia, unspecified: Secondary | ICD-10-CM

## 2015-06-26 DIAGNOSIS — K589 Irritable bowel syndrome without diarrhea: Secondary | ICD-10-CM

## 2015-06-26 LAB — LIPID PANEL
Cholesterol: 105 mg/dL (ref 0–200)
HDL: 39 mg/dL — AB (ref >39.00)
LDL Cholesterol: 49 mg/dL (ref 0–99)
NONHDL: 66.17
Total CHOL/HDL Ratio: 3
Triglycerides: 85 mg/dL (ref 0.0–149.0)
VLDL: 17 mg/dL (ref 0.0–40.0)

## 2015-06-26 LAB — COMPREHENSIVE METABOLIC PANEL
ALK PHOS: 68 U/L (ref 39–117)
ALT: 12 U/L (ref 0–53)
AST: 14 U/L (ref 0–37)
Albumin: 4.5 g/dL (ref 3.5–5.2)
BILIRUBIN TOTAL: 0.8 mg/dL (ref 0.2–1.2)
BUN: 13 mg/dL (ref 6–23)
CO2: 27 mEq/L (ref 19–32)
CREATININE: 0.85 mg/dL (ref 0.40–1.50)
Calcium: 9.9 mg/dL (ref 8.4–10.5)
Chloride: 101 mEq/L (ref 96–112)
GFR: 95.59 mL/min (ref >60.00)
GLUCOSE: 103 mg/dL — AB (ref 70–99)
Potassium: 4.2 mEq/L (ref 3.5–5.1)
SODIUM: 136 meq/L (ref 135–145)
TOTAL PROTEIN: 7 g/dL (ref 6.0–8.3)

## 2015-06-26 LAB — TSH: TSH: 3.2 u[IU]/mL (ref 0.35–4.50)

## 2015-06-26 NOTE — Progress Notes (Signed)
Subjective:  Patient ID: Roy Long, male    DOB: 1950-10-12  Age: 65 y.o. MRN: [id number]  CC: Coronary Artery Disease; Hyperlipidemia; Hypothyroidism; and Gastroesophageal Reflux   HPI CAYCEN NODAL presents for follow up-he is concerned about a recent CT scan of his coronary arteries that showed a 50% blockage in his LAD. He is upset that nobody from cardiology explained this to him. He does report for the last month he has had some dyspnea on exertion with exercise. He has had no chest pain or syncope.  He also complains of worsening dysphagia and early satiety. I treated him a few months ago for GERD with a PPI but he is not getting much better. He is also had some weight loss. He has had a few episodes of discomfort in his epigastrium and loose bowel movements. He has not seen any blood in his stools and he denies nausea, odynophagia, or vomiting.  Outpatient Prescriptions Prior to Visit  Medication Sig Dispense Refill  . acetaminophen (TYLENOL) 325 MG tablet Take 325 mg by mouth 3 (three) times daily as needed for mild pain, moderate pain or headache.     Marland Kitchen acyclovir (ZOVIRAX) 400 MG tablet TAKE 1 TABLET THREE TIMES A DAY (Patient taking differently: TAKE 1 TABLET daily for maintenance and THREE TIMES A DAY for outbreaks) 90 tablet 2  . apixaban (ELIQUIS) 5 MG TABS tablet Take 1 tablet (5 mg total) by mouth 2 (two) times daily. 180 tablet 1  . atorvastatin (LIPITOR) 80 MG tablet Take 1 tablet (80 mg total) by mouth daily. 30 tablet 11  . cholecalciferol (VITAMIN D) 1000 UNITS tablet Take 1,000 Units by mouth daily.    . cycloSPORINE (RESTASIS) 0.05 % ophthalmic emulsion Place 1 drop into both eyes 2 (two) times daily.    Marland Kitchen diltiazem (CARDIZEM CD) 180 MG 24 hr capsule Take 1 capsule (180 mg total) by mouth daily. 30 capsule 6  . finasteride (PROSCAR) 5 MG tablet TAKE 1 TABLET BY MOUTH DAILY (Patient taking differently: Take 5 mg by mouth daily. ) 90 tablet 3  . fluticasone (FLONASE)  50 MCG/ACT nasal spray Place 1 spray into both nostrils daily.     . hydroxypropyl methylcellulose / hypromellose (ISOPTO TEARS / GONIOVISC) 2.5 % ophthalmic solution Place 1 drop into both eyes daily as needed for dry eyes.    . hyoscyamine (LEVBID) 0.375 MG 12 hr tablet Take 1 tablet (0.375 mg total) by mouth 2 (two) times daily. (Patient taking differently: Take 0.375 mg by mouth 2 (two) times daily as needed for cramping. ) 60 tablet 2  . levothyroxine (SYNTHROID, LEVOTHROID) 25 MCG tablet TAKE 1 TABLET BY MOUTH EVERY DAY BEFORE BREAKFAST 90 tablet 1  . lisinopril (PRINIVIL,ZESTRIL) 5 MG tablet Take 1 tablet (5 mg total) by mouth daily. 90 tablet 1  . Multiple Vitamins-Minerals (PRESERVISION AREDS PO) Take 1 mg by mouth 2 (two) times daily.     Marland Kitchen omeprazole (PRILOSEC) 20 MG capsule Take 40 mg by mouth daily.     . tamsulosin (FLOMAX) 0.4 MG CAPS capsule Take 1 capsule (0.4 mg total) by mouth daily. 90 capsule 3  . Vitamins-Lipotropics (LIPOFLAVONOID PO) Take 1 tablet by mouth 3 (three) times daily.     Marland Kitchen loratadine (CLARITIN) 10 MG tablet Take 10 mg by mouth daily. Reported on 05/26/2015    . mirabegron ER (MYRBETRIQ) 50 MG TB24 tablet Take 1 tablet (50 mg total) by mouth daily. (Patient not taking: Reported on 05/30/2015)  30 tablet 5   No facility-administered medications prior to visit.    ROS Review of Systems  Constitutional: Positive for fatigue. Negative for fever, chills, diaphoresis, activity change, appetite change and unexpected weight change.  HENT: Positive for trouble swallowing. Negative for sore throat and voice change.   Eyes: Negative.  Negative for visual disturbance.  Respiratory: Positive for shortness of breath. Negative for cough, choking, chest tightness, wheezing and stridor.   Cardiovascular: Negative.  Negative for chest pain, palpitations and leg swelling.  Gastrointestinal: Positive for abdominal pain and diarrhea. Negative for nausea, vomiting, constipation, blood  in stool, abdominal distention, anal bleeding and rectal pain.  Endocrine: Negative.   Genitourinary: Negative.   Musculoskeletal: Negative.  Negative for myalgias, back pain, gait problem and neck pain.  Skin: Negative.  Negative for color change and rash.  Allergic/Immunologic: Negative.   Neurological: Negative.  Negative for dizziness, syncope, speech difficulty, weakness, light-headedness, numbness and headaches.  Hematological: Negative.  Negative for adenopathy. Does not bruise/bleed easily.  Psychiatric/Behavioral: Negative.     Objective:  BP 110/64 mmHg  Pulse 81  Temp(Src) 97.8 F (36.6 C) (Oral)  Resp 16  Ht 5\' 8"  (1.727 m)  Wt 125 lb (56.7 kg)  BMI 19.01 kg/m2  SpO2 98%  BP Readings from Last 3 Encounters:  06/26/15 110/64  05/31/15 120/58  05/26/15 112/62    Wt Readings from Last 3 Encounters:  06/26/15 125 lb (56.7 kg)  05/31/15 130 lb 1.1 oz (59 kg)  05/26/15 129 lb 12.8 oz (58.877 kg)    Physical Exam  Constitutional: He is oriented to person, place, and time. No distress.  HENT:  Head: Normocephalic and atraumatic.  Mouth/Throat: Oropharynx is clear and moist. No oropharyngeal exudate.  Eyes: Conjunctivae are normal. Right eye exhibits no discharge. Left eye exhibits no discharge. No scleral icterus.  Neck: Normal range of motion. Neck supple. No JVD present. No tracheal deviation present. No thyromegaly present.  Cardiovascular: Normal rate, regular rhythm, normal heart sounds and intact distal pulses.  Exam reveals no gallop and no friction rub.   No murmur heard. Pulmonary/Chest: Effort normal and breath sounds normal. No stridor. No respiratory distress. He has no wheezes. He has no rales. He exhibits no tenderness.  Abdominal: Soft. Bowel sounds are normal. He exhibits no distension and no mass. There is no tenderness. There is no rebound and no guarding.  Musculoskeletal: Normal range of motion. He exhibits no edema or tenderness.    Lymphadenopathy:    He has no cervical adenopathy.  Neurological: He is oriented to person, place, and time.  Skin: Skin is warm and dry. No rash noted. He is not diaphoretic. No erythema. No pallor.  Vitals reviewed.   Lab Results  Component Value Date   WBC 7.3 05/19/2015   HGB 14.6 05/19/2015   HCT 42.6 05/19/2015   PLT 228 05/19/2015   GLUCOSE 103* 06/26/2015   CHOL 105 06/26/2015   TRIG 85.0 06/26/2015   HDL 39.00* 06/26/2015   LDLCALC 49 06/26/2015   ALT 12 06/26/2015   AST 14 06/26/2015   NA 136 06/26/2015   K 4.2 06/26/2015   CL 101 06/26/2015   CREATININE 0.85 06/26/2015   BUN 13 06/26/2015   CO2 27 06/26/2015   TSH 3.20 06/26/2015   PSA 0.17 02/26/2015   INR 1.0 04/15/2011   HGBA1C 5.1 04/15/2011    Ct Coronary Morp W/cta Cor W/score W/ca W/cm &/or Wo/cm  05/26/2015  ADDENDUM REPORT: 05/26/2015 17:48 CLINICAL DATA:  Pre-atrial fibrillation ablation EXAM: Cardiac CTA MEDICATIONS: Sub lingual nitro. 4mg  and lopressor 5mg  IV x 1 TECHNIQUE: The patient was scanned on a [employer]ips [id number] slice scanner. Gantry rotation speed was 270 msecs. Collimation was .69mm. A 100 kV prospective scan was triggered in the descending thoracic aorta at 111 HU's with 5% padding centered around 78% of the R-R interval. Average HR during the scan was 62 bpm. The 3D data set was interpreted on a dedicated work station using MPR, MIP and VRT modes. A total of 80cc of contrast was used. FINDINGS: Non-cardiac: See separate report from [facility]. Calcium Score:  217 Agatston units Coronary Arteries: Right dominant with no anomalies LM:  Mixed plaque noted without significant stenosis. LAD system: Mixed plaque in the proximal LAD, suspect about 50% stenosis. Circumflex system: There was a moderate ramus with no significant disease. The AV LCx gave rise to a moderate PLOM. After the PLOM, the AV LCx was as a small vessel. There was mixed plaque in the proximal PLOM with probably mild stenosis. RCA:  There was mixed plaque in the proximal RCA with moderate stenosis. There was soft plaque in the mid RCA with probably mild stenosis. There did not appear to be a left atrial appendage thrombus. There were 4 pulmonary veins draining normally to the left atrium. Left upper PV 2.3 x 1.9 cm Left lower PV 2.4 x 1.1 cm Right upper PV 2.4 x 1.8 cm Right lower PV 2.6 x 1.1 cm IMPRESSION: 1. Coronary artery calcium score 217 Agatston units, placing the patient in the 64th percentile for age and gender. This suggests intermediate risk for future cardiac events. 2. Proximal LAD with up to 50% stenosis. Proximal RCA with moderate stenosis. Aggressive medical management if asymptomatic. If he has chest pain, would suggest cath versus functional study. 3.  Pulmonary veins as described above. Dalton Mclean Electronically Signed   By: Loralie Champagne M.D.   On: 05/26/2015 17:48  05/26/2015  EXAM: OVER-READ INTERPRETATION  CT CHEST The following report is an over-read performed by radiologist Dr. Julian Hy of [facility], [place] on 05/26/2015. This over-read does not include interpretation of cardiac or coronary anatomy or pathology. The coronary CTA interpretation by the cardiologist is attached. COMPARISON:  None. FINDINGS: Visualized lungs are clear. No suspicious pulmonary nodules. No focal consolidation. No pleural effusion or pneumothorax. No suspicious mediastinal lymphadenopathy. Visualized upper abdomen is unremarkable. No focal osseous lesions. IMPRESSION: No significant extracardiac findings. Electronically Signed: By: Julian Hy M.D. On: 05/26/2015 09:35    Assessment & Plan:   Antoinette was seen today for coronary artery disease, hyperlipidemia, hypothyroidism and gastroesophageal reflux.  Diagnoses and all orders for this visit:  Other secondary hypertension- his blood pressure is well controlled, electrolytes and renal function are stable. -     Comprehensive metabolic panel;  Future  Hyperlipidemia with target LDL less than 70- he is achieved his LDL goal is doing well on the statin. -     Lipid panel; Future -     TSH; Future  Other specified hypothyroidism- his TSH is in the normal range, he will remain on the current dose of Synthroid. -     Lipid panel; Future -     TSH; Future  Coronary artery disease involving native coronary artery of native heart without angina pectoris- he will discuss this with cardiology when he sees them next week. -     Lipid panel; Future  Dysphagia- I asked him to see GI to consider  upper endoscopy to evaluate for upper GI pathology -     Ambulatory referral to Gastroenterology  IBS (irritable bowel syndrome)- I've asked him to try probiotic for this -     Probiotic Product (ALIGN) 4 MG CAPS; Take 1 capsule (4 mg total) by mouth daily.   I have discontinued Mr. Gunzenhauser loratadine and mirabegron ER. I am also having him start on ALIGN. Additionally, I am having him maintain his Vitamins-Lipotropics (LIPOFLAVONOID PO), Multiple Vitamins-Minerals (PRESERVISION AREDS PO), cycloSPORINE, tamsulosin, finasteride, omeprazole, acetaminophen, cholecalciferol, lisinopril, hyoscyamine, diltiazem, levothyroxine, acyclovir, fluticasone, apixaban, hydroxypropyl methylcellulose / hypromellose, and atorvastatin.  Meds ordered this encounter  Medications  . Probiotic Product (ALIGN) 4 MG CAPS    Sig: Take 1 capsule (4 mg total) by mouth daily.    Dispense:  30 capsule    Refill:  11     Follow-up: Return in about 4 months (around 10/26/2015).  [name], MD

## 2015-06-26 NOTE — Progress Notes (Signed)
Pre visit review using our clinic review tool, if applicable. No additional management support is needed unless otherwise documented below in the visit note. 

## 2015-06-26 NOTE — Patient Instructions (Signed)

## 2015-06-27 DIAGNOSIS — K589 Irritable bowel syndrome without diarrhea: Secondary | ICD-10-CM | POA: Insufficient documentation

## 2015-06-27 MED ORDER — ALIGN 4 MG PO CAPS: 1.0000 | ORAL_CAPSULE | Freq: Every day | ORAL | Status: DC

## 2015-07-02 ENCOUNTER — Ambulatory Visit (HOSPITAL_COMMUNITY)
Admission: RE | Admit: 2015-07-02 | Discharge: 2015-07-02 | Disposition: A | Discharge status: Home / Self Care | Payer: Medicare Other | Source: Ambulatory Visit | Attending: Nurse Practitioner | Admitting: Nurse Practitioner

## 2015-07-02 ENCOUNTER — Encounter (HOSPITAL_COMMUNITY): Payer: Self-pay | Admitting: Nurse Practitioner

## 2015-07-02 VITALS — BP 120/62 | HR 96 | Ht 68.0 in | Wt 125.4 lb

## 2015-07-02 DIAGNOSIS — H3530 Unspecified macular degeneration: Secondary | ICD-10-CM | POA: Insufficient documentation

## 2015-07-02 DIAGNOSIS — K219 Gastro-esophageal reflux disease without esophagitis: Secondary | ICD-10-CM | POA: Insufficient documentation

## 2015-07-02 DIAGNOSIS — R0789 Other chest pain: Secondary | ICD-10-CM | POA: Insufficient documentation

## 2015-07-02 DIAGNOSIS — Z79899 Other long term (current) drug therapy: Secondary | ICD-10-CM | POA: Diagnosis not present

## 2015-07-02 DIAGNOSIS — I481 Persistent atrial fibrillation: Secondary | ICD-10-CM | POA: Diagnosis not present

## 2015-07-02 DIAGNOSIS — Z9889 Other specified postprocedural states: Secondary | ICD-10-CM | POA: Diagnosis not present

## 2015-07-02 DIAGNOSIS — Z8249 Family history of ischemic heart disease and other diseases of the circulatory system: Secondary | ICD-10-CM | POA: Insufficient documentation

## 2015-07-02 DIAGNOSIS — R1310 Dysphagia, unspecified: Secondary | ICD-10-CM | POA: Diagnosis not present

## 2015-07-02 DIAGNOSIS — Z7901 Long term (current) use of anticoagulants: Secondary | ICD-10-CM | POA: Diagnosis not present

## 2015-07-02 DIAGNOSIS — Z8679 Personal history of other diseases of the circulatory system: Secondary | ICD-10-CM | POA: Diagnosis not present

## 2015-07-02 DIAGNOSIS — Z87891 Personal history of nicotine dependence: Secondary | ICD-10-CM | POA: Diagnosis not present

## 2015-07-02 DIAGNOSIS — E7800 Pure hypercholesterolemia, unspecified: Secondary | ICD-10-CM | POA: Insufficient documentation

## 2015-07-02 DIAGNOSIS — Z823 Family history of stroke: Secondary | ICD-10-CM | POA: Diagnosis not present

## 2015-07-02 DIAGNOSIS — I10 Essential (primary) hypertension: Secondary | ICD-10-CM | POA: Diagnosis not present

## 2015-07-02 DIAGNOSIS — E039 Hypothyroidism, unspecified: Secondary | ICD-10-CM | POA: Insufficient documentation

## 2015-07-02 NOTE — Progress Notes (Signed)
Patient ID: Roy Long, male   DOB: 01-03-1951, 65 y.o.   MRN: [id number]     Primary Care Physician: [name], MD Referring Physician: [name] is a 65 y.o. male with a h/o afib ablation x one month ago, 05/30/15, in afib clinic, for f/u. He has not had any further afib. Flecainde stopped t time of ablation. Denies any new meds. He does have many complaints though. He does have h/o of gerd for many years but since the procedure, he feels like there is a bubble at the base of his throat that he is trying to swallow around it. He also describes burping a lot when swallowing. He saw his PCP within the week and omeprazole was increased to 40 mg a day from 20 mg a day, but he has not seen any improvement. No issues with groin access from procedure.  Second complaint is that he could see in my chart the results of the cardiac CT prior to ablation and it did describe coronary disease in the lad and rt coronary, which he has been very concerned about. He discussed this with Dr. [name] at time of ablation and statin was increased 80 mg day. Since the ablation when he goes to the gym, and gets on the elliptical machine, he will get chest burning, dyspnea and his symptoms are relieved when he sits down and rests. No chest pain at rest. He did have a low risk scan with excellent exercise tolerance 6 months ago. He also reports some dizziness with bending over that he did not notice before procedure. He scheduled an appointment tomorrow to discuss with Dr. [name]  re above c/o as well.  Today, he denies symptoms of palpitations, orthopnea, PND, lower extremity edema,, presyncope, syncope, or neurologic sequela. Positive for exertional chest pain, mild dyspnea, positional dizziness.The patient is tolerating medications without difficulties and is otherwise without complaint today.   Past Medical History  Diagnosis Date  . Atrial fibrillation ([place])   . Hypertension   . Alcohol  dependence ([place])   . GERD (gastroesophageal reflux disease)   . Age-related macular degeneration, dry, left eye   . Hypercholesterolemia dx'd 02/2015  . Hypothyroidism   . Depression     hx  . Family history of adverse reaction to anesthesia     "father had head injuries and dementia; everytime he was put under less of his mentation came back"  . Pneumonia ~ 2004  . Migraine aura without headache     "very rare now" (05/30/2015)  . Arthritis     "maybe a little bit in my left knee and right forefinger" (05/30/2015)  . Kidney stones   . Squamous carcinoma ([facility])     hand, chest  . Basal cell carcinoma    Past Surgical History  Procedure Laterality Date  . Anterior cervical decomp/discectomy fusion  04/2009  . Back surgery    . Cardioversion  06/2002  . Colonoscopy w/ polypectomy  02/2003  . Colonoscopy    . Cataract extraction w/ intraocular lens implant Left ~ 2012  . Mohs surgery Right     "temple"  . Cystoscopy w/ stone manipulation  1980's    "basketted it out; no stent"  . Atrial fibrillation ablation  05/30/2015  . Inguinal hernia repair Bilateral 2004  . Basal cell carcinoma excision      chest or back or left arm  . Squamous cell carcinoma excision      chest or  back or left arm  . Electrophysiologic study N/A 05/30/2015    Procedure: Atrial Fibrillation Ablation;  Surgeon: Will Meredith Leeds, MD;  Location: [facility];  Service: Cardiovascular;  Laterality: N/A;    Current Outpatient Prescriptions  Medication Sig Dispense Refill  . acetaminophen (TYLENOL) 325 MG tablet Take 325 mg by mouth 3 (three) times daily as needed for mild pain, moderate pain or headache.     Marland Kitchen acyclovir (ZOVIRAX) 400 MG tablet TAKE 1 TABLET THREE TIMES A DAY (Patient taking differently: TAKE 1 TABLET daily for maintenance and THREE TIMES A DAY for outbreaks) 90 tablet 2  . apixaban (ELIQUIS) 5 MG TABS tablet Take 1 tablet (5 mg total) by mouth 2 (two) times daily. 180 tablet 1  .  atorvastatin (LIPITOR) 80 MG tablet Take 1 tablet (80 mg total) by mouth daily. 30 tablet 11  . cholecalciferol (VITAMIN D) 1000 UNITS tablet Take 1,000 Units by mouth daily.    . cycloSPORINE (RESTASIS) 0.05 % ophthalmic emulsion Place 1 drop into both eyes 2 (two) times daily.    Marland Kitchen diltiazem (CARDIZEM CD) 180 MG 24 hr capsule Take 1 capsule (180 mg total) by mouth daily. 30 capsule 6  . finasteride (PROSCAR) 5 MG tablet TAKE 1 TABLET BY MOUTH DAILY (Patient taking differently: Take 5 mg by mouth daily. ) 90 tablet 3  . fluticasone (FLONASE) 50 MCG/ACT nasal spray Place 1 spray into both nostrils daily.     . hydroxypropyl methylcellulose / hypromellose (ISOPTO TEARS / GONIOVISC) 2.5 % ophthalmic solution Place 1 drop into both eyes daily as needed for dry eyes.    . hyoscyamine (LEVBID) 0.375 MG 12 hr tablet Take 1 tablet (0.375 mg total) by mouth 2 (two) times daily. (Patient taking differently: Take 0.375 mg by mouth 2 (two) times daily as needed for cramping. ) 60 tablet 2  . levothyroxine (SYNTHROID, LEVOTHROID) 25 MCG tablet TAKE 1 TABLET BY MOUTH EVERY DAY BEFORE BREAKFAST 90 tablet 1  . lisinopril (PRINIVIL,ZESTRIL) 5 MG tablet Take 1 tablet (5 mg total) by mouth daily. 90 tablet 1  . Multiple Vitamins-Minerals (PRESERVISION AREDS PO) Take 1 mg by mouth 2 (two) times daily.     Marland Kitchen omeprazole (PRILOSEC) 20 MG capsule Take 40 mg by mouth daily.     . Probiotic Product (ALIGN) 4 MG CAPS Take 1 capsule (4 mg total) by mouth daily. 30 capsule 11  . tamsulosin (FLOMAX) 0.4 MG CAPS capsule Take 1 capsule (0.4 mg total) by mouth daily. 90 capsule 3  . Vitamins-Lipotropics (LIPOFLAVONOID PO) Take 1 tablet by mouth 3 (three) times daily.      No current facility-administered medications for this encounter.    Allergies  Allergen Reactions  . Pneumococcal Vaccines Swelling    Shoulder to elbow swelling  . Tape Other (See Comments)    Please use "paper" tape    Social History   Social  History  . Marital Status: Single    Spouse Name: N/A  . Number of Children: 0  . Years of Education: N/A   Occupational History  . band teacher     retired   Social History Main Topics  . Smoking status: Former Smoker -- 3.00 packs/day for 17 years    Types: Cigarettes    Quit date: 03/30/1983  . Smokeless tobacco: Never Used  . Alcohol Use: Yes     Comment: 05/30/2015 "recovering alcoholic; dry date [id number]"  . Drug Use: Yes    Special: Marijuana,  LSD, Mescaline     Comment: 03/07/2015 "stopped all drugs back in the 1980's"  . Sexual Activity: Not Currently    Birth Control/ Protection: None   Other Topics Concern  . Not on file   Social History Narrative    Family History  Problem Relation Age of Onset  . Heart failure Mother   . Stroke Brother     X2  . Cancer Brother   . Heart disease Neg Hx   . Hyperlipidemia Neg Hx   . Hypertension Neg Hx   . Diabetes Neg Hx     ROS- All systems are reviewed and negative except as per the HPI above  Physical Exam: Filed Vitals:   07/02/15 1134  BP: 120/62  Pulse: 96  Height: 5\' 8"  (1.727 m)  Weight: 125 lb 6.4 oz (56.881 kg)    GEN- The patient is well appearing, alert and oriented x 3 today.   Head- normocephalic, atraumatic Eyes-  Sclera clear, conjunctiva pink Ears- hearing intact Oropharynx- clear Neck- supple, no JVP Lymph- no cervical lymphadenopathy Lungs- Clear to ausculation bilaterally, normal work of breathing Heart- Regular rate and rhythm, no murmurs, rubs or gallops, PMI not laterally displaced GI- soft, NT, ND, + BS Extremities- no clubbing, cyanosis, or edema MS- no significant deformity or atrophy Skin- no rash or lesion Psych- euthymic mood, full affect Neuro- strength and sensation are intact  EKG-SR at 96 bpm, septal infarct age undetermined Epic records reviewed  Assessment and Plan: 1. Persistent afib Maintaining SR s/p ablation Continue apixaban, diltiazem  2.  Dysphagia Discussed with Dr. [name] re swallowing issues Advised to scheduled chest ct with IV contrast to assess for esophageal damage from ablation. Scheduled with [address] office for 4/10  3. Exertional chest d/c with cad per cardiac ct. Dr. [name] will review chest pain c/o's with the pt tomorrow Continue atorvastatin   afib clinic as needed  Butch Penny C. Josmar Messimer, [facility] [address] [place], [place] [address] [phone]

## 2015-07-03 ENCOUNTER — Ambulatory Visit (INDEPENDENT_AMBULATORY_CARE_PROVIDER_SITE_OTHER): Payer: Medicare Other | Admitting: Cardiology

## 2015-07-03 ENCOUNTER — Encounter: Payer: Self-pay | Admitting: Cardiology

## 2015-07-03 VITALS — BP 126/72 | HR 82 | Ht 68.0 in | Wt 124.8 lb

## 2015-07-03 DIAGNOSIS — I480 Paroxysmal atrial fibrillation: Secondary | ICD-10-CM

## 2015-07-03 NOTE — Patient Instructions (Signed)
Medication Instructions:  Your physician recommends that you continue on your current medications as directed. Please refer to the Current Medication list given to you today.  Labwork: None ordered  Testing/Procedures: None ordered  Follow-Up: Keep your follow up appointment scheduled for 09/03/15 with Dr. [name].  If you need a refill on your cardiac medications before your next appointment, please call your pharmacy.  Thank you for choosing [facility]!!   Trinidad Curet, RN [phone]

## 2015-07-03 NOTE — Progress Notes (Signed)
Electrophysiology Office Note   Date:  07/03/2015   ID:  Roy, Long [birth year], MRN [id number]  PCP:  [name], MD  Cardiologist:  Peter Martinique Primary Electrophysiologist:  Will Meredith Leeds, MD    Chief Complaint  Patient presents with  . Chest Pain  . Follow-up     History of Present Illness: Roy Long is a 65 y.o. male who presents today for electrophysiology evaluation.   He has a history of atrial fibrillation on flecainide, hypertension, hyperlipidemia, depression. He was admitted to [facility] on 12/ 8/ 16 with palpitations noting atrial flutter with 2 to one conduction. He has atrial fibrillation because back to the early 2006 and has also had atrial flutter during that time. He underwent cardioversion in 2003 and has had one to 2 episodes of atrial fibrillation every 6 months control pill the pocket strategy with flecainide. He has had multiple episodes over the last 3-4 months occurring 4-5 times. The day of admission he went into atrial fibrillation and took flecainide and Cardizem. He was still in atrial fibrillation the next morning and was told to go to the emergency room. He was discharged He had ablation for AF on 05/30/15.   On his CT  Prior to ablation, it was noted that he had both LAD disease and circumflex disease. Since his ablation, he has had chest pain with exercise. He says that he goes to the gym and works out on the treadmill. He gets a burning sensation in the center of his chest that does not radiate and is associated with shortness of breath. He rests and the burning sensation and shortness of breath go away after a few minutes. He also has been having worsening heartburn. He feels that there is a lump in the upper abdomen as well as a lump in his throat. He says that he has been passing gas both belching and flatus quite a bit since the ablation.  His reflux has gotten worse since the ablation. He also notes that he gets dizzy when he stands  up. He says that he was working in his garden last week and did get dizzy when he stood. This was reproducible.  Today, he denies symptoms of palpitations,  orthopnea, PND, lower extremity edema, claudication, dizziness, presyncope, syncope, bleeding, or neurologic sequela. The patient is tolerating medications without difficulties and is otherwise without complaint today.    Past Medical History  Diagnosis Date  . Atrial fibrillation ([place])   . Hypertension   . Alcohol dependence ([place])   . GERD (gastroesophageal reflux disease)   . Age-related macular degeneration, dry, left eye   . Hypercholesterolemia dx'd 02/2015  . Hypothyroidism   . Depression     hx  . Family history of adverse reaction to anesthesia     "father had head injuries and dementia; everytime he was put under less of his mentation came back"  . Pneumonia ~ 2004  . Migraine aura without headache     "very rare now" (05/30/2015)  . Arthritis     "maybe a little bit in my left knee and right forefinger" (05/30/2015)  . Kidney stones   . Squamous carcinoma ([facility])     hand, chest  . Basal cell carcinoma    Past Surgical History  Procedure Laterality Date  . Anterior cervical decomp/discectomy fusion  04/2009  . Back surgery    . Cardioversion  06/2002  . Colonoscopy w/ polypectomy  02/2003  . Colonoscopy    .  Cataract extraction w/ intraocular lens implant Left ~ 2012  . Mohs surgery Right     "temple"  . Cystoscopy w/ stone manipulation  1980's    "basketted it out; no stent"  . Atrial fibrillation ablation  05/30/2015  . Inguinal hernia repair Bilateral 2004  . Basal cell carcinoma excision      chest or back or left arm  . Squamous cell carcinoma excision      chest or back or left arm  . Electrophysiologic study N/A 05/30/2015    Procedure: Atrial Fibrillation Ablation;  Surgeon: Will Meredith Leeds, MD;  Location: [facility];  Service: Cardiovascular;  Laterality: N/A;     Current Outpatient  Prescriptions  Medication Sig Dispense Refill  . acetaminophen (TYLENOL) 325 MG tablet Take 325 mg by mouth 3 (three) times daily as needed for mild pain, moderate pain or headache.     Marland Kitchen acyclovir (ZOVIRAX) 400 MG tablet Take 400 mg by mouth daily.    Marland Kitchen apixaban (ELIQUIS) 5 MG TABS tablet Take 1 tablet (5 mg total) by mouth 2 (two) times daily. 180 tablet 1  . atorvastatin (LIPITOR) 80 MG tablet Take 1 tablet (80 mg total) by mouth daily. 30 tablet 11  . cholecalciferol (VITAMIN D) 1000 UNITS tablet Take 1,000 Units by mouth daily.    . cycloSPORINE (RESTASIS) 0.05 % ophthalmic emulsion Place 1 drop into both eyes 2 (two) times daily.    Marland Kitchen diltiazem (CARDIZEM CD) 180 MG 24 hr capsule Take 1 capsule (180 mg total) by mouth daily. 30 capsule 6  . finasteride (PROSCAR) 5 MG tablet TAKE 1 TABLET BY MOUTH DAILY (Patient taking differently: Take 5 mg by mouth daily. ) 90 tablet 3  . fluticasone (FLONASE) 50 MCG/ACT nasal spray Place 1 spray into both nostrils daily.     . hydroxypropyl methylcellulose / hypromellose (ISOPTO TEARS / GONIOVISC) 2.5 % ophthalmic solution Place 1 drop into both eyes daily as needed for dry eyes.    . hyoscyamine (LEVBID) 0.375 MG 12 hr tablet Take 1 tablet (0.375 mg total) by mouth 2 (two) times daily. (Patient taking differently: Take 0.375 mg by mouth 2 (two) times daily as needed for cramping. ) 60 tablet 2  . levothyroxine (SYNTHROID, LEVOTHROID) 25 MCG tablet TAKE 1 TABLET BY MOUTH EVERY DAY BEFORE BREAKFAST 90 tablet 1  . lisinopril (PRINIVIL,ZESTRIL) 5 MG tablet Take 1 tablet (5 mg total) by mouth daily. 90 tablet 1  . Multiple Vitamins-Minerals (PRESERVISION AREDS PO) Take 1 mg by mouth 2 (two) times daily.     Marland Kitchen omeprazole (PRILOSEC) 20 MG capsule Take 40 mg by mouth daily.     . Probiotic Product (ALIGN) 4 MG CAPS Take 1 capsule (4 mg total) by mouth daily. 30 capsule 11  . tamsulosin (FLOMAX) 0.4 MG CAPS capsule Take 1 capsule (0.4 mg total) by mouth daily. 90  capsule 3  . Vitamins-Lipotropics (LIPOFLAVONOID PO) Take 1 tablet by mouth 3 (three) times daily.      No current facility-administered medications for this visit.    Allergies:   Pneumococcal vaccines and Tape   Social History:  The patient  reports that he quit smoking about 32 years ago. His smoking use included Cigarettes. He has a 51 pack-year smoking history. He has never used smokeless tobacco. He reports that he drinks alcohol. He reports that he uses illicit drugs (Marijuana, LSD, and Mescaline).   Family History:  The patient's family history includes Cancer in his brother;  Heart failure in his mother; Stroke in his brother. There is no history of Heart disease, Hyperlipidemia, Hypertension, or Diabetes.    ROS:  Please see the history of present illness.   Otherwise, review of systems is positive for chest pain, DOE, dizziness, fatigue.   All other systems are reviewed and negative.    PHYSICAL EXAM: VS:  BP 126/72 mmHg  Pulse 82  Ht 5\' 8"  (1.727 m)  Wt 124 lb 12.8 oz (56.609 kg)  BMI 18.98 kg/m2 , BMI Body mass index is 18.98 kg/(m^2). GEN: Well nourished, well developed, in no acute distress HEENT: normal Neck: no JVD, carotid bruits, or masses Cardiac: RRR; no murmurs, rubs, or gallops,no edema  Respiratory:  clear to auscultation bilaterally, normal work of breathing GI: soft, nontender, nondistended, + BS MS: no deformity or atrophy Skin: warm and dry Neuro:  Strength and sensation are intact Psych: euthymic mood, full affect  EKG: EKG is not ordered today. The ekg ordered 07/02/15 shows sinus rhythm, rate 96, RAA, PRWP  Recent Labs: 03/07/2015: Magnesium 1.9 05/19/2015: Hemoglobin 14.6; Platelets 228 06/26/2015: ALT 12; BUN 13; Creatinine, Ser 0.85; Potassium 4.2; Sodium 136; TSH 3.20    Lipid Panel     Component Value Date/Time   CHOL 105 06/26/2015 0944   TRIG 85.0 06/26/2015 0944   HDL 39.00* 06/26/2015 0944   CHOLHDL 3 06/26/2015 0944   VLDL 17.0  06/26/2015 0944   LDLCALC 49 06/26/2015 0944     Wt Readings from Last 3 Encounters:  07/03/15 124 lb 12.8 oz (56.609 kg)  07/02/15 125 lb 6.4 oz (56.881 kg)  06/26/15 125 lb (56.7 kg)      Other studies Reviewed: Additional studies/ records that were reviewed today include: TTE and MPI 12/12/14 Review of the above records today demonstrates:  Normal perfusion. LVEF 64% with normal wall motion. Excellent exercise tolerance. This is a low risk study.  - Left ventricle: The cavity size was normal. Systolic function was normal. The estimated ejection fraction was in the range of 60% to 65%. Wall motion was normal; there were no regional wall motion abnormalities. Doppler parameters are consistent with abnormal left ventricular relaxation (grade 1 diastolic dysfunction). - Mitral valve: Mildly thickened leaflets .   ASSESSMENT AND PLAN:  1. Persistent atrial fibrillation/atrial flutter:  Currently in sinus rhythm with no evidence of atrial arrhythmia since his procedure. He has, though, been having worsening reflux symptoms. Due to that I have ordered a CT scan to determine if there is any complication from the procedure.   2. Chest pain:  CT scan prior to ablation showed  Coronary disease with a 50% LAD stenosis and moderate RCA stenosis. His stat at the time was increased to 80 mg of Lipitor. He continues to have chest pain mainly with exertion, this sounds fairly typical. I will discuss the case further with his primary cardiac cardiologist to determine if he would benefit potentially from cardiac catheterization  3. Hypertension: Well controlled today   Current medicines are reviewed at length with the patient today.   The patient has concerns regarding his medicines.  The following changes were made today:  Switch metoprolol to diltiazem  Labs/ tests ordered today include:  No orders of the defined types were placed in this encounter.     Disposition:   FU  with Will Camnitz 2 months.  Signed, Will Meredith Leeds, MD  07/03/2015 2:02 PM     [place]  [id number] [phone] (office) [phone] (fax)

## 2015-07-05 ENCOUNTER — Other Ambulatory Visit: Payer: Self-pay | Admitting: Cardiology

## 2015-07-05 DIAGNOSIS — R079 Chest pain, unspecified: Secondary | ICD-10-CM

## 2015-07-05 NOTE — Telephone Encounter (Signed)
Called patient due to chest pain and GERD symptoms reported Thursday.  GERD symptoms improved.  Chest pain has not occurred but has not been exerting himself.  Due to recent ablation and increasing pain, Roy Long plan for nuclear stress testing.  Roy Long be able to compare to previous test to see if there is a change which Roy Long lead to possible heart catheterization.  Roy Lai, MD

## 2015-07-07 ENCOUNTER — Ambulatory Visit (INDEPENDENT_AMBULATORY_CARE_PROVIDER_SITE_OTHER)
Admission: RE | Admit: 2015-07-07 | Discharge: 2015-07-07 | Disposition: A | Discharge status: Home / Self Care | Payer: Medicare Other | Source: Ambulatory Visit | Attending: Cardiology | Admitting: Cardiology

## 2015-07-07 DIAGNOSIS — R1310 Dysphagia, unspecified: Secondary | ICD-10-CM | POA: Diagnosis not present

## 2015-07-07 MED ORDER — IOPAMIDOL (ISOVUE-300) INJECTION 61%
100.0000 mL | Freq: Once | INTRAVENOUS | Status: AC | PRN
  Administered 2015-07-07: 75 mL via INTRAVENOUS

## 2015-07-16 ENCOUNTER — Encounter (HOSPITAL_COMMUNITY): Payer: Medicare Other

## 2015-07-17 ENCOUNTER — Ambulatory Visit (HOSPITAL_COMMUNITY): Payer: Medicare Other | Attending: Cardiology

## 2015-07-17 DIAGNOSIS — R079 Chest pain, unspecified: Secondary | ICD-10-CM | POA: Insufficient documentation

## 2015-07-17 LAB — MYOCARDIAL PERFUSION IMAGING
CHL CUP MPHR: 154 {beats}/min
CHL RATE OF PERCEIVED EXERTION: 18
CSEPEDS: 30 s
CSEPEW: 12.5 METS
CSEPHR: 90 %
CSEPPHR: 139 {beats}/min
Exercise duration (min): 10 min
LHR: 0.18
LVDIAVOL: 71 mL (ref 62–150)
LVSYSVOL: 20 mL
NUC STRESS TID: 1.1
Rest HR: 73 {beats}/min
SDS: 1
SRS: 6
SSS: 7

## 2015-07-17 MED ORDER — TECHNETIUM TC 99M SESTAMIBI GENERIC - CARDIOLITE
10.6000 | Freq: Once | INTRAVENOUS | Status: AC | PRN
  Administered 2015-07-17: 11 via INTRAVENOUS

## 2015-07-17 MED ORDER — TECHNETIUM TC 99M SESTAMIBI GENERIC - CARDIOLITE
32.4000 | Freq: Once | INTRAVENOUS | Status: AC | PRN
  Administered 2015-07-17: 32 via INTRAVENOUS

## 2015-08-20 ENCOUNTER — Encounter: Payer: Self-pay | Admitting: Gastroenterology

## 2015-08-20 ENCOUNTER — Telehealth: Payer: Self-pay

## 2015-08-20 ENCOUNTER — Ambulatory Visit (INDEPENDENT_AMBULATORY_CARE_PROVIDER_SITE_OTHER): Payer: Medicare Other | Admitting: Gastroenterology

## 2015-08-20 ENCOUNTER — Other Ambulatory Visit (INDEPENDENT_AMBULATORY_CARE_PROVIDER_SITE_OTHER): Payer: Medicare Other

## 2015-08-20 VITALS — BP 106/58 | HR 68 | Ht 68.0 in | Wt 125.0 lb

## 2015-08-20 DIAGNOSIS — R1314 Dysphagia, pharyngoesophageal phase: Secondary | ICD-10-CM

## 2015-08-20 DIAGNOSIS — Z7901 Long term (current) use of anticoagulants: Secondary | ICD-10-CM

## 2015-08-20 DIAGNOSIS — K219 Gastro-esophageal reflux disease without esophagitis: Secondary | ICD-10-CM | POA: Diagnosis not present

## 2015-08-20 DIAGNOSIS — R143 Flatulence: Secondary | ICD-10-CM

## 2015-08-20 DIAGNOSIS — Z86010 Personal history of colonic polyps: Secondary | ICD-10-CM

## 2015-08-20 LAB — IGA: IgA: 108 mg/dL (ref 68–378)

## 2015-08-20 MED ORDER — OMEPRAZOLE 40 MG PO CPDR: 40.0000 mg | DELAYED_RELEASE_CAPSULE | Freq: Two times a day (BID) | ORAL | Status: DC

## 2015-08-20 NOTE — Telephone Encounter (Signed)
  08/20/2015   RE: Roy Long DOB: 1950-09-09 MRN: [id number]   Dear Dr. Martinique,    We have scheduled the above patient for an endoscopic procedure. Our records show that he is on anticoagulation therapy.   Please advise as to how long the patient may come off his therapy of Eliquis prior to an Upper Endoscopy.   Please route your answer to Marlon Pel, CMA  Sincerely,    Marlon Pel

## 2015-08-20 NOTE — Progress Notes (Signed)
    History of Present Illness: This is a 65 year old male referred by Roy Lima, MD for the evaluation of dysphagia. Patient underwent general anesthesia and intubation for atrial fibrillation ablation on March 3. He states he has had difficulty swallowing since that time. He also notes increased intestinal gas, flatulence, belching. He noted a sore throat for several days following the intubation that resolved. His swallowing difficulties have improved very gradually since his atrial fibrillation but have not resolved. He underwent a CT scan of the chest on 07/17/2015 that did not reveal any acute abnormality. He states he has had GERD for over 30 years and states he's had vocal changes and throat symptoms related to GERD in the past. He has not previously had an upper endoscopy. He was treated with omeprazole 20 mg daily which was increased to 40 mg daily without any improvement in symptoms so he returned to 20 mg daily. He notes certain foods have led to intestinal gas, flatulence and belching and these intolerances all started following his atrial fibrillation as well. He was previously followed by Dr. Teena Irani with [facility] and states he had colon polyps in the past but none on his last colonoscopy in 2015 which only showed diverticulosis. He notes a 5 pound weight loss since his atrial fibrillation. Denies  abdominal pain, constipation, diarrhea, change in stool caliber, melena, hematochezia, nausea, vomiting, chest pain.  Review of Systems: Pertinent positive and negative review of systems were noted in the above HPI section. All other review of systems were otherwise negative.  Current Medications, Allergies, Past Medical History, Past Surgical History, Family History and Social History were reviewed in [employer] record.  Physical Exam: General: Well developed, well nourished, no acute distress Head: Normocephalic and atraumatic Eyes:  sclerae anicteric,  EOMI Ears: Normal auditory acuity Mouth: No deformity or lesions Neck: Supple, no masses or thyromegaly Lungs: Clear throughout to auscultation Heart: Regular rate and rhythm; no murmurs, rubs or bruits Abdomen: Soft, non tender and non distended. No masses, hepatosplenomegaly or hernias noted. Normal Bowel sounds Musculoskeletal: Symmetrical with no gross deformities  Skin: No lesions on visible extremities Pulses:  Normal pulses noted Extremities: No clubbing, cyanosis, edema or deformities noted Neurological: Alert oriented x 4, grossly nonfocal Cervical Nodes:  No significant cervical adenopathy Inguinal Nodes: No significant inguinal adenopathy Psychological:  Alert and cooperative. Flat affect  Assessment and Recommendations:  1. Dysphagia that started abruptly following general anesthesia and intubation. Most likely a pharyngeal etiology for his symptoms. Rule out an esophageal stricture and/or esophagitis. Schedule barium esophagram. Will contact his cardiologist to see if he is able to temporarily hold anticoagulation to allow for EGD with possible biopsy and possible dilation. Proceed with EGD if he can safely, temporarily hold anticoagulation.  2. 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 Follow all standard antireflux measures. Increase omeprazole to 40 mg twice daily taken before breakfast and dinner for at least 3 months and assess response. If all of his ENT symptoms do not resolve will recommend ENT referral.  3. New food intolerances with intestinal gas and flatulence. Trial of lactose-free diet. Gas-X qid prn. Obtain tTG and IgA today. Avoid or minimize foods that trigger symptoms.  4. Personal history of colon polyps. Request records from [facility].    cc: Roy Lima, MD [address] [place], [place] [address]

## 2015-08-20 NOTE — Telephone Encounter (Signed)
He may hold Eliquis for 48 hours prior to endoscopy.  Trannie Bardales Martinique MD, [facility]

## 2015-08-20 NOTE — Patient Instructions (Addendum)
We have sent the following medications to your pharmacy for you to pick up at your convenience: omeprazole 40 mg one tablet by mouth twice daily.   Patient advised to avoid spicy, acidic, citrus, chocolate, mints, fruit and fruit juices.  Limit the intake of caffeine, alcohol and Soda.  Don't exercise too soon after eating.  Don't lie down within 3-4 hours of eating.  Elevate the head of your bed.  We will contact you after we hear back from your Cardiologist regarding stopping your Eliquis prior to a Upper Endoscopy.   You have been scheduled for a Barium Esophogram at [facility] (1st floor of the hospital) on 08/22/15 at 11:30am. Please arrive 15 minutes prior to your appointment for registration. Make certain not to have anything to eat or drink 6 hours prior to your test. If you need to reschedule for any reason, please contact radiology at [phone] to do so. __________________________________________________________________ A barium swallow is an examination that concentrates on views of the esophagus. This tends to be a double contrast exam (barium and two liquids which, when combined, create a gas to distend the wall of the oesophagus) or single contrast (non-ionic iodine based). The study is usually tailored to your symptoms so a good history is essential. Attention is paid during the study to the form, structure and configuration of the esophagus, looking for functional disorders (such as aspiration, dysphagia, achalasia, motility and reflux) EXAMINATION You may be asked to change into a gown, depending on the type of swallow being performed. A radiologist and radiographer will perform the procedure. The radiologist will advise you of the type of contrast selected for your procedure and direct you during the exam. You will be asked to stand, sit or lie in several different positions and to hold a small amount of fluid in your mouth before being asked to swallow while the imaging is  performed .In some instances you may be asked to swallow barium coated marshmallows to assess the motility of a solid food bolus. The exam can be recorded as a digital or video fluoroscopy procedure. POST PROCEDURE It will take 1-2 days for the barium to pass through your system. To facilitate this, it is important, unless otherwise directed, to increase your fluids for the next 24-48hrs and to resume your normal diet.  This test typically takes about 30 minutes to perform. __________________________________________________________________________________   Thank you for choosing me and [facility].  Pricilla Riffle. Dagoberto Ligas., MD., [facility]

## 2015-08-21 LAB — TISSUE TRANSGLUTAMINASE, IGA: TISSUE TRANSGLUTAMINASE AB, IGA: 1 U/mL (ref <4)

## 2015-08-21 NOTE — Telephone Encounter (Signed)
Informed patient that per Dr. Martinique patient can hold Eliquis prior to a Upper Endoscopy. Patient states he is going out of town until 09/01/15 and will not be able to schedule until then. Informed patient that I will let Dr. Fuller Plan know of Dr. Doug Sou recommendations and we will contact the patient when he returns and schedule the procedure. Patient verbalized understanding.

## 2015-08-21 NOTE — Telephone Encounter (Signed)
Schedule EGD off Eliqius for 2 days after BA esophagram

## 2015-08-22 ENCOUNTER — Ambulatory Visit (HOSPITAL_COMMUNITY): Payer: Medicare Other

## 2015-08-23 ENCOUNTER — Other Ambulatory Visit: Payer: Self-pay | Admitting: Cardiology

## 2015-09-01 ENCOUNTER — Ambulatory Visit (HOSPITAL_COMMUNITY)
Admission: RE | Admit: 2015-09-01 | Discharge: 2015-09-01 | Disposition: A | Discharge status: Home / Self Care | Payer: Medicare Other | Source: Ambulatory Visit | Attending: Gastroenterology | Admitting: Gastroenterology

## 2015-09-01 DIAGNOSIS — K224 Dyskinesia of esophagus: Secondary | ICD-10-CM | POA: Diagnosis not present

## 2015-09-01 DIAGNOSIS — K219 Gastro-esophageal reflux disease without esophagitis: Secondary | ICD-10-CM | POA: Diagnosis not present

## 2015-09-01 DIAGNOSIS — R1314 Dysphagia, pharyngoesophageal phase: Secondary | ICD-10-CM | POA: Insufficient documentation

## 2015-09-02 NOTE — Progress Notes (Signed)
Electrophysiology Office Note   Date:  09/03/2015   ID:  Roy, Long [birth year], MRN [id number]  PCP:  [name], MD  Cardiologist:  Peter Martinique Primary Electrophysiologist:  Adanna Zuckerman Meredith Leeds, MD    No chief complaint on file.    History of Present Illness: Roy Long is a 65 y.o. male who presents today for electrophysiology evaluation.   He has a history of atrial fibrillation on flecainide, hypertension, hyperlipidemia, depression. He was admitted to [facility] on 12/ 8/ 16 with palpitations noting atrial flutter with 2 to one conduction. He has atrial fibrillation because back to the early 2006 and has also had atrial flutter during that time. He underwent cardioversion in 2003 and has had one to 2 episodes of atrial fibrillation every 6 months control pill the pocket strategy with flecainide. He has had multiple episodes over the last 3-4 months occurring 4-5 times. The day of admission he went into atrial fibrillation and took flecainide and Cardizem. He was still in atrial fibrillation the next morning and was told to go to the emergency room. He was discharged He had ablation for AF on 05/30/15.  Today, he denies symptoms of palpitations,  orthopnea, PND, lower extremity edema, claudication, dizziness, presyncope, syncope, bleeding, or neurologic sequela. The patient is tolerating medications without difficulties and is otherwise without complaint today.  He is feeling well without any major complaints.   Past Medical History  Diagnosis Date  . Atrial fibrillation ([place])   . Hypertension   . Alcohol dependence ([place])   . GERD (gastroesophageal reflux disease)   . Age-related macular degeneration, dry, left eye   . Hypercholesterolemia dx'd 02/2015  . Hypothyroidism   . Depression     hx  . Family history of adverse reaction to anesthesia     "father had head injuries and dementia; everytime he was put under less of his mentation came back"  . Pneumonia ~ 2004   . Migraine aura without headache     "very rare now" (05/30/2015)  . Arthritis     "maybe a little bit in my left knee and right forefinger" (05/30/2015)  . Kidney stones   . Squamous carcinoma (HCC)     hand, chest  . Basal cell carcinoma    Past Surgical History  Procedure Laterality Date  . Anterior cervical decomp/discectomy fusion  04/2009  . Back surgery    . Cardioversion  06/2002  . Colonoscopy w/ polypectomy  02/2003  . Colonoscopy    . Cataract extraction w/ intraocular lens implant Left ~ 2012  . Mohs surgery Right     "temple"  . Cystoscopy w/ stone manipulation  1980's    "basketted it out; no stent"  . Atrial fibrillation ablation  05/30/2015  . Inguinal hernia repair Bilateral 2004  . Basal cell carcinoma excision      chest or back or left arm  . Squamous cell carcinoma excision      chest or back or left arm  . Electrophysiologic study N/A 05/30/2015    Procedure: Atrial Fibrillation Ablation;  Surgeon: Jyaire Koudelka Meredith Leeds, MD;  Location: [facility];  Service: Cardiovascular;  Laterality: N/A;     Current Outpatient Prescriptions  Medication Sig Dispense Refill  . acetaminophen (TYLENOL) 325 MG tablet Take 325 mg by mouth 3 (three) times daily as needed for mild pain, moderate pain or headache.     Marland Kitchen acyclovir (ZOVIRAX) 400 MG tablet Take 400 mg by mouth daily.    Marland Kitchen  apixaban (ELIQUIS) 5 MG TABS tablet Take 1 tablet (5 mg total) by mouth 2 (two) times daily. 180 tablet 1  . atorvastatin (LIPITOR) 80 MG tablet Take 1 tablet (80 mg total) by mouth daily. 30 tablet 11  . cholecalciferol (VITAMIN D) 1000 UNITS tablet Take 1,000 Units by mouth daily.    . cycloSPORINE (RESTASIS) 0.05 % ophthalmic emulsion Place 1 drop into both eyes 2 (two) times daily.    Marland Kitchen diltiazem (CARDIZEM CD) 180 MG 24 hr capsule Take 1 capsule (180 mg total) by mouth daily. 30 capsule 6  . finasteride (PROSCAR) 5 MG tablet Take 5 mg by mouth daily.    . fluticasone (FLONASE) 50 MCG/ACT  nasal spray Place 1 spray into both nostrils daily.     . hydroxypropyl methylcellulose / hypromellose (ISOPTO TEARS / GONIOVISC) 2.5 % ophthalmic solution Place 1 drop into both eyes daily as needed for dry eyes.    Marland Kitchen levothyroxine (SYNTHROID, LEVOTHROID) 25 MCG tablet TAKE 1 TABLET BY MOUTH EVERY DAY BEFORE BREAKFAST 90 tablet 1  . Multiple Vitamins-Minerals (PRESERVISION AREDS PO) Take 1 mg by mouth 2 (two) times daily.     Marland Kitchen omeprazole (PRILOSEC) 20 MG capsule Take 20 mg by mouth 2 (two) times daily as needed.    . Probiotic Product (ALIGN) 4 MG CAPS Take 1 capsule (4 mg total) by mouth daily. 30 capsule 11  . tamsulosin (FLOMAX) 0.4 MG CAPS capsule Take 1 capsule (0.4 mg total) by mouth daily. 90 capsule 3  . Vitamins-Lipotropics (LIPOFLAVONOID PO) Take 1 tablet by mouth 3 (three) times daily.      No current facility-administered medications for this visit.    Allergies:   Pneumococcal vaccines and Tape   Social History:  The patient  reports that he quit smoking about 32 years ago. His smoking use included Cigarettes. He has a 51 pack-year smoking history. He has never used smokeless tobacco. He reports that he drinks alcohol. He reports that he uses illicit drugs (Marijuana, LSD, and Mescaline).   Family History:  The patient's family history includes Cancer in his brother; Heart failure in his mother; Stroke in his brother. There is no history of Heart disease, Hyperlipidemia, Hypertension, or Diabetes.    ROS:  Please see the history of present illness.   Otherwise, review of systems is positive for none.   All other systems are reviewed and negative.    PHYSICAL EXAM: VS:  BP 104/72 mmHg  Pulse 73  Ht 5\' 8"  (1.727 m)  Wt 123 lb 6.4 oz (55.974 kg)  BMI 18.77 kg/m2 , BMI Body mass index is 18.77 kg/(m^2). GEN: Well nourished, well developed, in no acute distress HEENT: normal Neck: no JVD, carotid bruits, or masses Cardiac: RRR; no murmurs, rubs, or gallops,no edema    Respiratory:  clear to auscultation bilaterally, normal work of breathing GI: soft, nontender, nondistended, + BS MS: no deformity or atrophy Skin: warm and dry Neuro:  Strength and sensation are intact Psych: euthymic mood, full affect  EKG: EKG is ordered today. The ekg ordered shows sinus rhythm, poor R wave progression  Recent Labs: 03/07/2015: Magnesium 1.9 05/19/2015: Hemoglobin 14.6; Platelets 228 06/26/2015: ALT 12; BUN 13; Creatinine, Ser 0.85; Potassium 4.2; Sodium 136; TSH 3.20    Lipid Panel     Component Value Date/Time   CHOL 105 06/26/2015 0944   TRIG 85.0 06/26/2015 0944   HDL 39.00* 06/26/2015 0944   CHOLHDL 3 06/26/2015 0944   VLDL 17.0 06/26/2015 0944  LDLCALC 49 06/26/2015 0944     Wt Readings from Last 3 Encounters:  09/03/15 123 lb 6.4 oz (55.974 kg)  08/20/15 125 lb (56.7 kg)  07/17/15 124 lb (56.246 kg)      Other studies Reviewed: Additional studies/ records that were reviewed today include: TTE and MPI 12/12/14 Review of the above records today demonstrates:  Normal perfusion. LVEF 64% with normal wall motion. Excellent exercise tolerance. This is a low risk study.  - Left ventricle: The cavity size was normal. Systolic function was normal. The estimated ejection fraction was in the range of 60% to 65%. Wall motion was normal; there were no regional wall motion abnormalities. Doppler parameters are consistent with abnormal left ventricular relaxation (grade 1 diastolic dysfunction). - Mitral valve: Mildly thickened leaflets .   ASSESSMENT AND PLAN:  1. Persistent atrial fibrillation/atrial flutter:  Currently in sinus rhythm with no evidence of atrial arrhythmia since his procedure.  We'll continue his anticoagulation.  This patients CHA2DS2-VASc Score and unadjusted Ischemic Stroke Rate (% per year) is equal to 2.2 % stroke rate/year from a score of 2  Above score calculated as 1 point each if present [CHF, HTN, DM,  Vascular=MI/PAD/Aortic Plaque, Age if 65-74, or Male] Above score calculated as 2 points each if present [Age > 75, or Stroke/TIA/TE]   2.  Hypertension: Well controlled today. He is having significant dizziness upon standing. We'll therefore stop his lisinopril and decrease his diltiazem to 120 mg.   Current medicines are reviewed at length with the patient today.   The patient has concerns regarding his medicines.  The following changes were made today:  Decrease diltiazem, stop lisinopril  Labs/ tests ordered today include:  No orders of the defined types were placed in this encounter.     Disposition:   FU with Danna Sewell 6 months.  Signed, Juliane Guest Meredith Leeds, MD  09/03/2015 11:21 AM     [facility] [address] [place] [address] [phone] (office) [phone] (fax)

## 2015-09-03 ENCOUNTER — Encounter: Payer: Self-pay | Admitting: Cardiology

## 2015-09-03 ENCOUNTER — Ambulatory Visit (INDEPENDENT_AMBULATORY_CARE_PROVIDER_SITE_OTHER): Payer: Medicare Other | Admitting: Cardiology

## 2015-09-03 VITALS — BP 104/72 | HR 73 | Ht 68.0 in | Wt 123.4 lb

## 2015-09-03 DIAGNOSIS — I480 Paroxysmal atrial fibrillation: Secondary | ICD-10-CM | POA: Diagnosis not present

## 2015-09-03 MED ORDER — DILTIAZEM HCL ER COATED BEADS 120 MG PO CP24: 120.0000 mg | ORAL_CAPSULE | Freq: Every day | ORAL | Status: DC

## 2015-09-03 NOTE — Patient Instructions (Signed)
Medication Instructions:  Your physician has recommended you make the following change in your medication: 1) STOP Lisinopril 2) DECREASE Diltiazem to 120 mg daily  Labwork: None ordered  Testing/Procedures: None ordered  Follow-Up: Your physician wants you to follow-up in: 6 months with Dr. [name]. You will receive a reminder letter in the mail two months in advance. If you don't receive a letter, please call our office to schedule the follow-up appointment.  If you need a refill on your cardiac medications before your next appointment, please call your pharmacy.  Thank you for choosing [facility]!!   Trinidad Curet, RN [phone]

## 2015-09-17 ENCOUNTER — Ambulatory Visit (AMBULATORY_SURGERY_CENTER): Payer: Self-pay | Admitting: *Deleted

## 2015-09-17 VITALS — Ht 68.0 in | Wt 125.0 lb

## 2015-09-17 DIAGNOSIS — R1314 Dysphagia, pharyngoesophageal phase: Secondary | ICD-10-CM

## 2015-09-17 DIAGNOSIS — K219 Gastro-esophageal reflux disease without esophagitis: Secondary | ICD-10-CM

## 2015-09-17 NOTE — Progress Notes (Signed)
Patient denies any allergies to eggs or soy. Patient denies any problems with anesthesia/sedation. Patient denies any oxygen use at home and does not take any diet/weight loss medications.  

## 2015-09-22 ENCOUNTER — Encounter: Payer: Medicare Other | Admitting: Gastroenterology

## 2015-09-30 ENCOUNTER — Encounter: Payer: Self-pay | Admitting: Gastroenterology

## 2015-10-03 LAB — HM COLONOSCOPY

## 2015-10-13 ENCOUNTER — Encounter: Payer: Self-pay | Admitting: Gastroenterology

## 2015-10-13 ENCOUNTER — Ambulatory Visit (AMBULATORY_SURGERY_CENTER): Payer: Medicare Other | Admitting: Gastroenterology

## 2015-10-13 VITALS — BP 115/64 | HR 62 | Temp 97.3°F | Resp 14 | Ht 68.0 in | Wt 125.0 lb

## 2015-10-13 DIAGNOSIS — K2100 Gastro-esophageal reflux disease with esophagitis, without bleeding: Secondary | ICD-10-CM

## 2015-10-13 DIAGNOSIS — R1314 Dysphagia, pharyngoesophageal phase: Secondary | ICD-10-CM | POA: Diagnosis not present

## 2015-10-13 DIAGNOSIS — K229 Disease of esophagus, unspecified: Secondary | ICD-10-CM | POA: Diagnosis not present

## 2015-10-13 DIAGNOSIS — K208 Other esophagitis: Secondary | ICD-10-CM | POA: Diagnosis not present

## 2015-10-13 MED ORDER — OMEPRAZOLE 40 MG PO CPDR: 40.0000 mg | DELAYED_RELEASE_CAPSULE | Freq: Every day | ORAL | Status: DC

## 2015-10-13 MED ORDER — SODIUM CHLORIDE 0.9 % IV SOLN: 500.0000 mL | INTRAVENOUS | Status: DC

## 2015-10-13 NOTE — Op Note (Signed)
Secretary Patient Name: Roy Long Procedure Date: 10/13/2015 9:41 AM MRN: [id number] Endoscopist: Ladene Artist , MD Age: 65 Referring MD:  Date of Birth: 26-Nov-1950 Gender: Male Account #: [id number] Procedure:                Upper GI endoscopy Indications:              Dysphagia, Suspected esophageal reflux Medicines:                Monitored Anesthesia Care Procedure:                Pre-Anesthesia Assessment:                           - Prior to the procedure, a History and Physical                            was performed, and patient medications and                            allergies were reviewed. The patient's tolerance of                            previous anesthesia was also reviewed. The risks                            and benefits of the procedure and the sedation                            options and risks were discussed with the patient.                            All questions were answered, and informed consent                            was obtained. Prior Anticoagulants: The patient has                            taken Eliquis (apixaban), last dose was 2 days                            prior to procedure. ASA Grade Assessment: III - A                            patient with severe systemic disease. After                            reviewing the risks and benefits, the patient was                            deemed in satisfactory condition to undergo the                            procedure.  After obtaining informed consent, the endoscope was                            passed under direct vision. Throughout the                            procedure, the patient's blood pressure, pulse, and                            oxygen saturations were monitored continuously. The                            Model GIF-HQ190 [id number]) scope was introduced                            through the mouth, and advanced to the second part                           of duodenum. The upper GI endoscopy was                            accomplished without difficulty. The exam was                            limited by retained gastric solid food. The patient                            tolerated the procedure well. Scope In: Scope Out: Findings:                 LA Grade A (one or more mucosal breaks less than 5                            mm, not extending between tops of 2 mucosal folds)                            esophagitis with no bleeding was found in the                            distal esophagus.                           The Z-line was variable and was found 39 cm from                            the incisors. Biopsies were taken with a cold                            forceps for histology.                           The exam of the esophagus was otherwise normal.  A large amount of food (residue) was found in the                            gastric fundus and in the gastric body with                            obscured all but the lesser curvature.                           The exam of the stomach was otherwise normal.                           The duodenal bulb and second portion of the                            duodenum were normal. Complications:            No immediate complications. Estimated Blood Loss:     Estimated blood loss: none. Impression:               - LA Grade A reflux esophagitis.                           - Z-line variable, 39 cm from the incisors.                            Biopsied.                           - A large amount of food (residue) in the stomach.                           - Normal duodenal bulb and second portion of the                            duodenum. Recommendation:           - Patient has a contact number available for                            emergencies. The signs and symptoms of potential                            delayed complications were discussed  with the                            patient. Return to normal activities tomorrow.                            Written discharge instructions were provided to the                            patient.                           - Resume previous diet.                           -  Prilosec (omeprazole) 40 mg PO daily indefinitely.                           - Recommend GES (gastric emptying scan) at                            appointment to be scheduled.                           - Continue present medications.                           - Await pathology results.                           - Resume Eliquis (apixaban) at prior dose in 2                            days. Refer to managing physician for further                            adjustment of therapy. Ladene Artist, MD 10/13/2015 10:01:41 AM This report has been signed electronically.

## 2015-10-13 NOTE — Patient Instructions (Addendum)
YOU HAD AN ENDOSCOPIC PROCEDURE TODAY AT THE [facility]:   Refer to the procedure report that was given to you for any specific questions about what was found during the examination.  If the procedure report does not answer your questions, please call your gastroenterologist to clarify.  If you requested that your care partner not be given the details of your procedure findings, then the procedure report has been included in a sealed envelope for you to review at your convenience later.  YOU SHOULD EXPECT: Some feelings of bloating in the abdomen. Passage of more gas than usual.  Walking can help get rid of the air that was put into your GI tract during the procedure and reduce the bloating. If you had a lower endoscopy (such as a colonoscopy or flexible sigmoidoscopy) you may notice spotting of blood in your stool or on the toilet paper. If you underwent a bowel prep for your procedure, you may not have a normal bowel movement for a few days.  Please Note:  You might notice some irritation and congestion in your nose or some drainage.  This is from the oxygen used during your procedure.  There is no need for concern and it should clear up in a day or so.  SYMPTOMS TO REPORT IMMEDIATELY:   Following lower endoscopy (colonoscopy or flexible sigmoidoscopy):  Excessive amounts of blood in the stool  Significant tenderness or worsening of abdominal pains  Swelling of the abdomen that is new, acute  Fever of 100F or higher   Following upper endoscopy (EGD)  Vomiting of blood or coffee ground material  New chest pain or pain under the shoulder blades  Painful or persistently difficult swallowing  New shortness of breath  Fever of 100F or higher  Black, tarry-looking stools  For urgent or emergent issues, a gastroenterologist can be reached at any hour by calling [phone].   DIET: Your first meal following the procedure should be a small meal and then it is ok to progress to  your normal diet. Heavy or fried foods are harder to digest and may make you feel nauseous or bloated.  Likewise, meals heavy in dairy and vegetables can increase bloating.  Drink plenty of fluids but you should avoid alcoholic beverages for 24 hours.  ACTIVITY:  You should plan to take it easy for the rest of today and you should NOT DRIVE or use heavy machinery until tomorrow (because of the sedation medicines used during the test).    FOLLOW UP: Our staff will call the number listed on your records the next business day following your procedure to check on you and address any questions or concerns that you may have regarding the information given to you following your procedure. If we do not reach you, we will leave a message.  However, if you are feeling well and you are not experiencing any problems, there is no need to return our call.  We will assume that you have returned to your regular daily activities without incident.  If any biopsies were taken you will be contacted by phone or by letter within the next 1-3 weeks.  Please call us at [phone] if you have not heard about the biopsies in 3 weeks.    SIGNATURES/CONFIDENTIALITY: You and/or your care partner have signed paperwork which will be entered into your electronic medical record.  These signatures attest to the fact that that the information above on your After Visit Summary has been reviewed   and is understood.  Full responsibility of the confidentiality of this discharge information lies with you and/or your care-partner.YOU HAD AN ENDOSCOPIC PROCEDURE TODAY AT [facility]:   Refer to the procedure report that was given to you for any specific questions about what was found during the examination.  If the procedure report does not answer your questions, please call your gastroenterologist to clarify.  If you requested that your care partner not be given the details of your procedure findings, then the procedure  report has been included in a sealed envelope for you to review at your convenience later.  YOU SHOULD EXPECT: Some feelings of bloating in the abdomen. Passage of more gas than usual.  Walking can help get rid of the air that was put into your GI tract during the procedure and reduce the bloating. If you had a lower endoscopy (such as a colonoscopy or flexible sigmoidoscopy) you may notice spotting of blood in your stool or on the toilet paper. If you underwent a bowel prep for your procedure, you may not have a normal bowel movement for a few days.  Please Note:  You might notice some irritation and congestion in your nose or some drainage.  This is from the oxygen used during your procedure.  There is no need for concern and it should clear up in a day or so.  SYMPTOMS TO REPORT IMMEDIATELY:   Following upper endoscopy (EGD)  Vomiting of blood or coffee ground material  New chest pain or pain under the shoulder blades  Painful or persistently difficult swallowing  New shortness of breath  Fever of 100F or higher  Black, tarry-looking stools  For urgent or emergent issues, a gastroenterologist can be reached at any hour by calling [phone].   DIET: Your first meal following the procedure should be a small meal and then it is ok to progress to your normal diet. Heavy or fried foods are harder to digest and may make you feel nauseous or bloated.  Likewise, meals heavy in dairy and vegetables can increase bloating.  Drink plenty of fluids but you should avoid alcoholic beverages for 24 hours.  ACTIVITY:  You should plan to take it easy for the rest of today and you should NOT DRIVE or use heavy machinery until tomorrow (because of the sedation medicines used during the test).    FOLLOW UP: Our staff will call the number listed on your records the next business day following your procedure to check on you and address any questions or concerns that you may have regarding the information  given to you following your procedure. If we do not reach you, we will leave a message.  However, if you are feeling well and you are not experiencing any problems, there is no need to return our call.  We will assume that you have returned to your regular daily activities without incident.  If any biopsies were taken you will be contacted by phone or by letter within the next 1-3 weeks.  Please call us at [phone] if you have not heard about the biopsies in 3 weeks.    SIGNATURES/CONFIDENTIALITY: You and/or your care partner have signed paperwork which will be entered into your electronic medical record.  These signatures attest to the fact that that the information above on your After Visit Summary has been reviewed and is understood.  Full responsibility of the confidentiality of this discharge information lies with you and/or your care-partner.  Esophagitis and reflux handouts provided. Begin Prilosec 40 mg daily. Resume Eliquis at prior in two days.Marland KitchenMarland Kitchen7/19/2017. Resume present medications.

## 2015-10-13 NOTE — Progress Notes (Signed)
A/ox3, pleased with MAC, report to RN 

## 2015-10-13 NOTE — Progress Notes (Signed)
No egg or soy allergy known to patient  No issues with past sedation with any surgeries  or procedures, no intubation problems  No diet pills per patient No home 02 use per patient  Takes blood thinners per patient -pt takes Eliquis for hx afib- last dose was Friday 7-14 per pt per instructions

## 2015-10-13 NOTE — Progress Notes (Signed)
Called to room to assist during endoscopic procedure.  Patient ID and intended procedure confirmed with present staff. Received instructions for my participation in the procedure from the performing physician.  

## 2015-10-14 ENCOUNTER — Telehealth: Payer: Self-pay

## 2015-10-14 ENCOUNTER — Other Ambulatory Visit: Payer: Self-pay

## 2015-10-14 DIAGNOSIS — R1013 Epigastric pain: Secondary | ICD-10-CM

## 2015-10-14 DIAGNOSIS — K3189 Other diseases of stomach and duodenum: Secondary | ICD-10-CM

## 2015-10-14 DIAGNOSIS — R1310 Dysphagia, unspecified: Secondary | ICD-10-CM

## 2015-10-14 NOTE — Telephone Encounter (Signed)
  Follow up Call-  Call back number 10/13/2015  Post procedure Call Back phone  # [phone]  Permission to leave phone message Yes     Patient questions:  Do you have a fever, pain , or abdominal swelling? No. Pain Score  0 *  Have you tolerated food without any problems? Yes.    Have you been able to return to your normal activities? Yes.    Do you have any questions about your discharge instructions: Diet   No. Medications  No. Follow up visit  No.  Do you have questions or concerns about your Care? No.  Actions: * If pain score is 4 or above: No action needed, pain <4.

## 2015-10-16 ENCOUNTER — Other Ambulatory Visit: Payer: Self-pay | Admitting: Internal Medicine

## 2015-10-21 ENCOUNTER — Encounter: Payer: Self-pay | Admitting: Gastroenterology

## 2015-10-28 ENCOUNTER — Telehealth: Payer: Self-pay | Admitting: Gastroenterology

## 2015-10-28 NOTE — Telephone Encounter (Signed)
Questions answered about upcoming GES.  He will call back for additional questions or concerns.

## 2015-11-05 ENCOUNTER — Ambulatory Visit (HOSPITAL_COMMUNITY)
Admission: RE | Admit: 2015-11-05 | Discharge: 2015-11-05 | Disposition: A | Discharge status: Home / Self Care | Payer: Medicare Other | Source: Ambulatory Visit | Attending: Gastroenterology | Admitting: Gastroenterology

## 2015-11-05 DIAGNOSIS — R1310 Dysphagia, unspecified: Secondary | ICD-10-CM | POA: Diagnosis not present

## 2015-11-05 DIAGNOSIS — R1013 Epigastric pain: Secondary | ICD-10-CM | POA: Diagnosis not present

## 2015-11-05 DIAGNOSIS — K30 Functional dyspepsia: Secondary | ICD-10-CM | POA: Diagnosis not present

## 2015-11-05 DIAGNOSIS — K3189 Other diseases of stomach and duodenum: Secondary | ICD-10-CM

## 2015-11-05 MED ORDER — TECHNETIUM TC 99M SULFUR COLLOID
2.2000 | Freq: Once | INTRAVENOUS | Status: AC | PRN
  Administered 2015-11-05: 2.2 via INTRAVENOUS

## 2015-11-10 ENCOUNTER — Other Ambulatory Visit: Payer: Self-pay | Admitting: *Deleted

## 2015-11-10 ENCOUNTER — Ambulatory Visit (INDEPENDENT_AMBULATORY_CARE_PROVIDER_SITE_OTHER): Payer: Medicare Other | Admitting: Ophthalmology

## 2015-11-10 DIAGNOSIS — H43813 Vitreous degeneration, bilateral: Secondary | ICD-10-CM

## 2015-11-10 DIAGNOSIS — E785 Hyperlipidemia, unspecified: Secondary | ICD-10-CM

## 2015-11-10 DIAGNOSIS — H353131 Nonexudative age-related macular degeneration, bilateral, early dry stage: Secondary | ICD-10-CM

## 2015-11-10 DIAGNOSIS — I10 Essential (primary) hypertension: Secondary | ICD-10-CM

## 2015-11-10 DIAGNOSIS — H35033 Hypertensive retinopathy, bilateral: Secondary | ICD-10-CM

## 2015-11-10 MED ORDER — ATORVASTATIN CALCIUM 80 MG PO TABS: 80.0000 mg | ORAL_TABLET | Freq: Every day | ORAL | 3 refills | Status: DC

## 2015-11-10 NOTE — Telephone Encounter (Signed)
atorvastatin (LIPITOR) 80 MG tablet  Medication  Date: 05/31/2015 Department: [facility] CATH RECOVERY Ordering/Authorizing: Eileen Stanford, PA-C  Order Providers   Prescribing Provider Encounter Provider  Eileen Stanford, PA-C None  Medication Detail    Disp Refills Start End   atorvastatin (LIPITOR) 80 MG tablet 30 tablet 11 05/31/2015    Sig - Route: Take 1 tablet (80 mg total) by mouth daily. - Oral   E-Prescribing Status: Receipt confirmed by pharmacy (05/31/2015 7:58 AM EST)   Associated Diagnoses   Hyperlipidemia with target LDL less than 130 - Primary     Pharmacy   [facility] #[id number] - [place], [place]   Changing to 90/3 per pt request

## 2015-11-13 ENCOUNTER — Telehealth: Payer: Self-pay | Admitting: Gastroenterology

## 2015-11-14 NOTE — Telephone Encounter (Signed)
Reviewed again with the patient the results of GES. We discussed that patient should try the gastroparesis diet.  He has follow up on 01/01/16 with Dr., Fuller Plan to further discuss.

## 2015-11-18 ENCOUNTER — Other Ambulatory Visit: Payer: Self-pay | Admitting: Cardiology

## 2016-01-01 ENCOUNTER — Ambulatory Visit (INDEPENDENT_AMBULATORY_CARE_PROVIDER_SITE_OTHER): Payer: Medicare Other | Admitting: Gastroenterology

## 2016-01-01 ENCOUNTER — Encounter: Payer: Self-pay | Admitting: Gastroenterology

## 2016-01-01 VITALS — BP 124/62 | HR 80 | Ht 68.0 in | Wt 123.8 lb

## 2016-01-01 DIAGNOSIS — K219 Gastro-esophageal reflux disease without esophagitis: Secondary | ICD-10-CM | POA: Diagnosis not present

## 2016-01-01 DIAGNOSIS — K3184 Gastroparesis: Secondary | ICD-10-CM

## 2016-01-01 NOTE — Progress Notes (Signed)
    History of Present Illness: This is a 65 year old male returning for follow-up of GERD and mild gastroparesis. He is completely asymptomatic. He has a list of questions that we reviewed in detail.   GES 11/05/2015 IMPRESSION: 1. Slight delay in gastric emptying. No evidence gastric outlet obstruction. Findings could reflect a mild degree of gastroparesis.  EGD 10/13/2015 - LA Grade A reflux esophagitis. - Z-line variable, 39 cm from the incisors. Biopsied. - A large amount of food (residue) in the stomach. - Normal duodenal bulb and second portion of the duodenum.  Esophagram 09/01/2015 IMPRESSION: 1. Nonspecific esophageal motility disorder with intermittent failure to propagate the primary peristaltic wave, and occasional very mild tertiary contractions. 2. Otherwise, normal esophagram, as above.  Current Medications, Allergies, Past Medical History, Past Surgical History, Family History and Social History were reviewed in [employer] record.  Physical Exam: General: Well developed, well nourished, no acute distress Head: Normocephalic and atraumatic Eyes:  sclerae anicteric, EOMI Ears: Normal auditory acuity Psychological:  Alert and cooperative. Normal mood and affect  Assessment and Recommendations:  1. Mild gastroparesis. He was concerned about the severity of his condition and the strict diet. He has a standard gastroparesis diet. We discussed in detail ways to liberalize his diet and assess if symptoms occur. Reassured as to the mild severity of his condition. He can adjust his diet as necessary for symptom control using the gastroparesis diet as a guideline.   2. GERD with LA class A erosive esophagitis. Nonspecific esophageal dysmotility. Continue standard antireflux measures and omeprazole 40 mg daily.  I spent 25 minutes of face-to-face time with the patient. Greater than 50% of the time was spent counseling and coordinating care.

## 2016-01-01 NOTE — Patient Instructions (Signed)
Thank you for choosing me and [facility].  Malcolm T. Stark, Jr., MD., FACG  

## 2016-02-02 ENCOUNTER — Other Ambulatory Visit: Payer: Self-pay | Admitting: Internal Medicine

## 2016-02-02 DIAGNOSIS — N400 Enlarged prostate without lower urinary tract symptoms: Secondary | ICD-10-CM

## 2016-02-18 ENCOUNTER — Encounter: Payer: Self-pay | Admitting: Cardiology

## 2016-03-01 ENCOUNTER — Ambulatory Visit (INDEPENDENT_AMBULATORY_CARE_PROVIDER_SITE_OTHER): Payer: Medicare Other | Admitting: Cardiology

## 2016-03-01 ENCOUNTER — Encounter: Payer: Self-pay | Admitting: Cardiology

## 2016-03-01 VITALS — BP 128/64 | HR 80 | Ht 68.0 in | Wt 127.6 lb

## 2016-03-01 DIAGNOSIS — I480 Paroxysmal atrial fibrillation: Secondary | ICD-10-CM

## 2016-03-01 NOTE — Patient Instructions (Addendum)
Medication Instructions:    Your physician recommends that you continue on your current medications as directed. Please refer to the Current Medication list given to you today.  --- If you need a refill on your cardiac medications before your next appointment, please call your pharmacy. ---  Labwork:  None ordered  Testing/Procedures:  None ordered  Follow-Up:  Your physician wants you to follow-up in: 6 months with Dr. Camnitz.  You will receive a reminder letter in the mail two months in advance. If you don't receive a letter, please call our office to schedule the follow-up appointment.  Any Other Special Instructions Will Be Listed Below (If Applicable)  Thank you for choosing [facility]!!   Sherri Price, RN [phone]         

## 2016-03-01 NOTE — Progress Notes (Signed)
Electrophysiology Office Note   Date:  03/01/2016   ID:  Roy, Long June 12, 1950, MRN [id number]  PCP:  [name], MD  Cardiologist:  Peter Martinique Primary Electrophysiologist:  Ziya Coonrod Meredith Leeds, MD    Chief Complaint  Patient presents with  . Follow-up    PAF     History of Present Illness: Roy Long is a 65 y.o. male who presents today for electrophysiology evaluation.   He has a history of atrial fibrillation on flecainide, hypertension, hyperlipidemia, depression. He was admitted to [facility] on 12/ 8/ 16 with palpitations noting atrial flutter with 2 to one conduction. He has atrial fibrillation because back to the early 2006 and has also had atrial flutter during that time. He underwent cardioversion in 2003 and has had one to 2 episodes of atrial fibrillation every 6 months control pill the pocket strategy with flecainide. He has had multiple episodes over the last 3-4 months occurring 4-5 times. The day of admission he went into atrial fibrillation and took flecainide and Cardizem. He was still in atrial fibrillation the next morning and was told to go to the emergency room. He was discharged He had ablation for AF on 05/30/15.  Today, he denies symptoms of palpitations,  orthopnea, PND, lower extremity edema, claudication, dizziness, presyncope, syncope, bleeding, or neurologic sequela. The patient is tolerating medications without difficulties. He is been having bursts of tachycardia   the last between 10 and 15 seconds. A month ago he was having multiple bursts per day, but he is having now a few per week. He is unsure if he is in atrial fibrillation or atrial flutter. He is also been diagnosed with gastroparesis and has been adjusting his diet. He has lost 10 pounds during that time.   Past Medical History:  Diagnosis Date  . Age-related macular degeneration, dry, left eye   . Alcohol dependence ([place])   . Arthritis    "maybe a little bit in my left knee and  right forefinger" (05/30/2015)  . Atrial fibrillation ([place])    ablation 05-2015 with no reoccurance as of 09-2015  . Basal cell carcinoma   . Depression    hx  . Family history of adverse reaction to anesthesia    "father had head injuries and dementia; everytime he was put under less of his mentation came back"  . Gastroparesis   . GERD (gastroesophageal reflux disease)   . Hypercholesterolemia dx'd 02/2015  . Hypertension   . Hypothyroidism   . Kidney stones   . Migraine aura without headache    "very rare now" (05/30/2015)  . Pneumonia ~ 2004  . Squamous carcinoma    hand, chest   Past Surgical History:  Procedure Laterality Date  . ANTERIOR CERVICAL DECOMP/DISCECTOMY FUSION  04/2009  . ATRIAL FIBRILLATION ABLATION  05/30/2015  . BACK SURGERY    . BASAL CELL CARCINOMA EXCISION     chest or back or left arm  . CARDIOVERSION  06/2002  . CATARACT EXTRACTION W/ INTRAOCULAR LENS IMPLANT Left ~ 2012  . COLONOSCOPY    . COLONOSCOPY W/ POLYPECTOMY  02/2003  . CYSTOSCOPY W/ STONE MANIPULATION  1980's   "basketted it out; no stent"  . ELECTROPHYSIOLOGIC STUDY N/A 05/30/2015   Procedure: Atrial Fibrillation Ablation;  Surgeon: Yaden Seith Meredith Leeds, MD;  Location: [place] [facility];  Service: Cardiovascular;  Laterality: N/A;  . INGUINAL HERNIA REPAIR Bilateral 2004  . MOHS SURGERY Right    "temple"  . SQUAMOUS CELL  CARCINOMA EXCISION     chest or back or left arm     Current Outpatient Prescriptions  Medication Sig Dispense Refill  . acetaminophen (TYLENOL) 325 MG tablet Take 325 mg by mouth 3 (three) times daily as needed for mild pain, moderate pain or headache.     Marland Kitchen acyclovir (ZOVIRAX) 400 MG tablet TAKE 1 TABLET THREE TIMES A DAY 90 tablet 2  . atorvastatin (LIPITOR) 80 MG tablet Take 1 tablet (80 mg total) by mouth daily. 90 tablet 3  . cholecalciferol (VITAMIN D) 1000 UNITS tablet Take 1,000 Units by mouth daily.    . cycloSPORINE (RESTASIS) 0.05 % ophthalmic emulsion Place 1 drop  into both eyes 2 (two) times daily.    Marland Kitchen diltiazem (CARDIZEM CD) 120 MG 24 hr capsule Take 1 capsule (120 mg total) by mouth daily. 90 capsule 2  . ELIQUIS 5 MG TABS tablet TAKE 1 TABLET (5 MG TOTAL) BY MOUTH 2 (TWO) TIMES DAILY. 180 tablet 1  . finasteride (PROSCAR) 5 MG tablet Take 5 mg by mouth daily.    . hydroxypropyl methylcellulose / hypromellose (ISOPTO TEARS / GONIOVISC) 2.5 % ophthalmic solution Place 1 drop into both eyes daily as needed for dry eyes.    Marland Kitchen levothyroxine (SYNTHROID, LEVOTHROID) 25 MCG tablet TAKE 1 TABLET BY MOUTH EVERY DAY BEFORE BREAKFAST 90 tablet 1  . Multiple Vitamins-Minerals (PRESERVISION AREDS PO) Take 1 mg by mouth 2 (two) times daily.     Marland Kitchen omeprazole (PRILOSEC) 40 MG capsule Take 1 capsule (40 mg total) by mouth daily. 90 capsule 11  . tamsulosin (FLOMAX) 0.4 MG CAPS capsule TAKE 1 CAPSULE (0.4 MG TOTAL) BY MOUTH DAILY. 90 capsule 3  . Vitamins-Lipotropics (LIPOFLAVONOID PO) Take 1 tablet by mouth 3 (three) times daily.      No current facility-administered medications for this visit.     Allergies:   Pneumococcal vaccines and Tape   Social History:  The patient  reports that he quit smoking about 32 years ago. His smoking use included Cigarettes. He has a 51.00 pack-year smoking history. He has never used smokeless tobacco. He reports that he does not drink alcohol or use drugs.   Family History:  The patient's family history includes Cancer in his brother; Colon cancer (age of onset: 32) in his cousin; Heart failure in his mother; Stroke in his brother.    ROS:  Please see the history of present illness.   Otherwise, review of systems is positive for palpitations, appetite change.   All other systems are reviewed and negative.    PHYSICAL EXAM: VS:  BP 128/64   Pulse 80   Ht 5\' 8"  (1.727 m)   Wt 127 lb 9.6 oz (57.9 kg)   BMI 19.40 kg/m  , BMI Body mass index is 19.4 kg/m. GEN: Well nourished, well developed, in no acute distress  HEENT: normal    Neck: no JVD, carotid bruits, or masses Cardiac: RRR; no murmurs, rubs, or gallops,no edema  Respiratory:  clear to auscultation bilaterally, normal work of breathing GI: soft, nontender, nondistended, + BS MS: no deformity or atrophy  Skin: warm and dry Neuro:  Strength and sensation are intact Psych: euthymic mood, full affect  EKG: EKG is not ordered today. Personal review of the ekg ordered 09/03/15 shows sinus rhythm, poor R wave progression  Recent Labs: 03/07/2015: Magnesium 1.9 05/19/2015: Hemoglobin 14.6; Platelets 228 06/26/2015: ALT 12; BUN 13; Creatinine, Ser 0.85; Potassium 4.2; Sodium 136; TSH 3.20    Lipid  Panel     Component Value Date/Time   CHOL 105 06/26/2015 0944   TRIG 85.0 06/26/2015 0944   HDL 39.00 (L) 06/26/2015 0944   CHOLHDL 3 06/26/2015 0944   VLDL 17.0 06/26/2015 0944   LDLCALC 49 06/26/2015 0944     Wt Readings from Last 3 Encounters:  03/01/16 127 lb 9.6 oz (57.9 kg)  01/01/16 123 lb 12.8 oz (56.2 kg)  10/13/15 125 lb (56.7 kg)      Other studies Reviewed: Additional studies/ records that were reviewed today include: TTE and MPI 12/12/14 Review of the above records today demonstrates:  Normal perfusion. LVEF 64% with normal wall motion. Excellent exercise tolerance. This is a low risk study.  - Left ventricle: The cavity size was normal. Systolic function was normal. The estimated ejection fraction was in the range of 60% to 65%. Wall motion was normal; there were no regional wall motion abnormalities. Doppler parameters are consistent with abnormal left ventricular relaxation (grade 1 diastolic dysfunction). - Mitral valve: Mildly thickened leaflets .   ASSESSMENT AND PLAN:  1. Persistent atrial fibrillation/atrial flutter:  Currently in sinus rhythm with no evidence of atrial arrhythmia since his procedure.  Dyllon Henken continue his anticoagulation. I told him that the palpitations lasted longer than a few seconds, we can go back  to flecainide.  This patients CHA2DS2-VASc Score and unadjusted Ischemic Stroke Rate (% per year) is equal to 2.2 % stroke rate/year from a score of 2  Above score calculated as 1 point each if present [CHF, HTN, DM, Vascular=MI/PAD/Aortic Plaque, Age if 65-74, or Male] Above score calculated as 2 points each if present [Age > 75, or Stroke/TIA/TE]   2.  Hypertension: Well controlled today. No changes needed at this time.   Current medicines are reviewed at length with the patient today.   The patient has concerns regarding his medicines.  The following changes were made today:  Decrease diltiazem, stop lisinopril  Labs/ tests ordered today include:  No orders of the defined types were placed in this encounter.    Disposition:   FU with Chandlar Staebell 6 months.  Signed, Arlynn Stare Meredith Leeds, MD  03/01/2016 9:54 AM     [facility] [address] [place] [address] [phone] (office) [phone] (fax)

## 2016-03-18 ENCOUNTER — Other Ambulatory Visit (INDEPENDENT_AMBULATORY_CARE_PROVIDER_SITE_OTHER): Payer: Medicare Other

## 2016-03-18 ENCOUNTER — Encounter: Payer: Self-pay | Admitting: Internal Medicine

## 2016-03-18 ENCOUNTER — Ambulatory Visit (INDEPENDENT_AMBULATORY_CARE_PROVIDER_SITE_OTHER): Payer: Medicare Other | Admitting: Internal Medicine

## 2016-03-18 VITALS — BP 138/64 | HR 57 | Temp 98.0°F | Ht 68.0 in | Wt 125.2 lb

## 2016-03-18 DIAGNOSIS — N400 Enlarged prostate without lower urinary tract symptoms: Secondary | ICD-10-CM

## 2016-03-18 DIAGNOSIS — I2510 Atherosclerotic heart disease of native coronary artery without angina pectoris: Secondary | ICD-10-CM | POA: Diagnosis not present

## 2016-03-18 DIAGNOSIS — E785 Hyperlipidemia, unspecified: Secondary | ICD-10-CM

## 2016-03-18 DIAGNOSIS — E039 Hypothyroidism, unspecified: Secondary | ICD-10-CM

## 2016-03-18 DIAGNOSIS — R3129 Other microscopic hematuria: Secondary | ICD-10-CM

## 2016-03-18 DIAGNOSIS — Z0000 Encounter for general adult medical examination without abnormal findings: Secondary | ICD-10-CM | POA: Diagnosis not present

## 2016-03-18 DIAGNOSIS — I10 Essential (primary) hypertension: Secondary | ICD-10-CM

## 2016-03-18 LAB — CBC WITH DIFFERENTIAL/PLATELET
BASOS PCT: 0.4 % (ref 0.0–3.0)
Basophils Absolute: 0 10*3/uL (ref 0.0–0.1)
EOS ABS: 0.1 10*3/uL (ref 0.0–0.7)
EOS PCT: 2.1 % (ref 0.0–5.0)
HEMATOCRIT: 44.3 % (ref 39.0–52.0)
HEMOGLOBIN: 15.3 g/dL (ref 13.0–17.0)
LYMPHS PCT: 37.5 % (ref 12.0–46.0)
Lymphs Abs: 2.5 10*3/uL (ref 0.7–4.0)
MCHC: 34.5 g/dL (ref 30.0–36.0)
MCV: 90.3 fl (ref 78.0–100.0)
Monocytes Absolute: 0.6 10*3/uL (ref 0.1–1.0)
Monocytes Relative: 8.6 % (ref 3.0–12.0)
NEUTROS ABS: 3.4 10*3/uL (ref 1.4–7.7)
Neutrophils Relative %: 51.4 % (ref 43.0–77.0)
PLATELETS: 226 10*3/uL (ref 150.0–400.0)
RBC: 4.91 Mil/uL (ref 4.22–5.81)
RDW: 12.7 % (ref 11.5–15.5)
WBC: 6.6 10*3/uL (ref 4.0–10.5)

## 2016-03-18 LAB — URINALYSIS, ROUTINE W REFLEX MICROSCOPIC
Bilirubin Urine: NEGATIVE
Ketones, ur: NEGATIVE
LEUKOCYTES UA: NEGATIVE
NITRITE: NEGATIVE
SPECIFIC GRAVITY, URINE: 1.01 (ref 1.000–1.030)
Total Protein, Urine: NEGATIVE
Urine Glucose: NEGATIVE
Urobilinogen, UA: 0.2 (ref 0.0–1.0)
pH: 7.5 (ref 5.0–8.0)

## 2016-03-18 LAB — LIPID PANEL
CHOL/HDL RATIO: 2
Cholesterol: 97 mg/dL (ref 0–200)
HDL: 40.9 mg/dL (ref >39.00)
LDL CALC: 42 mg/dL (ref 0–99)
NonHDL: 55.86
Triglycerides: 68 mg/dL (ref 0.0–149.0)
VLDL: 13.6 mg/dL (ref 0.0–40.0)

## 2016-03-18 LAB — COMPREHENSIVE METABOLIC PANEL
ALBUMIN: 4.5 g/dL (ref 3.5–5.2)
ALT: 23 U/L (ref 0–53)
AST: 19 U/L (ref 0–37)
Alkaline Phosphatase: 86 U/L (ref 39–117)
BUN: 12 mg/dL (ref 6–23)
CALCIUM: 9.4 mg/dL (ref 8.4–10.5)
CHLORIDE: 105 meq/L (ref 96–112)
CO2: 32 meq/L (ref 19–32)
CREATININE: 0.83 mg/dL (ref 0.40–1.50)
GFR: 98.04 mL/min (ref >60.00)
Glucose, Bld: 97 mg/dL (ref 70–99)
POTASSIUM: 4.2 meq/L (ref 3.5–5.1)
SODIUM: 142 meq/L (ref 135–145)
Total Bilirubin: 0.7 mg/dL (ref 0.2–1.2)
Total Protein: 6.9 g/dL (ref 6.0–8.3)

## 2016-03-18 LAB — PSA: PSA: 0.15 ng/mL (ref 0.10–4.00)

## 2016-03-18 LAB — TSH: TSH: 2.87 u[IU]/mL (ref 0.35–4.50)

## 2016-03-18 NOTE — Progress Notes (Signed)
Pre visit review using our clinic review tool, if applicable. No additional management support is needed unless otherwise documented below in the visit note. 

## 2016-03-18 NOTE — Progress Notes (Signed)
Subjective:  Patient ID: DAXYN BERENDT, male    DOB: [birth year]  Age: 65 y.o. MRN: [id number]  CC: Annual Exam; Hypertension; Hypothyroidism; and Hyperlipidemia   HPI JERMANI LATIMER presents for an AWV/CPX.  He has felt well lately with no episodes of fatigue, palpitations, chest pain, shortness of breath, edema, or near-syncope. He feels like his levothyroxine dose is adequate.  He is tolerating his statin therapy well with no muscle or joint aches.  Past Medical History:  Diagnosis Date  . Age-related macular degeneration, dry, left eye   . Alcohol dependence ([place])   . Arthritis    "maybe a little bit in my left knee and right forefinger" (05/30/2015)  . Atrial fibrillation ([place])    ablation 05-2015 with no reoccurance as of 09-2015  . Basal cell carcinoma   . Depression    hx  . Family history of adverse reaction to anesthesia    "father had head injuries and dementia; everytime he was put under less of his mentation came back"  . Gastroparesis   . GERD (gastroesophageal reflux disease)   . Hypercholesterolemia dx'd 02/2015  . Hypertension   . Hypothyroidism   . Kidney stones   . Migraine aura without headache    "very rare now" (05/30/2015)  . Pneumonia ~ 2004  . Squamous carcinoma    hand, chest   Past Surgical History:  Procedure Laterality Date  . ANTERIOR CERVICAL DECOMP/DISCECTOMY FUSION  04/2009  . ATRIAL FIBRILLATION ABLATION  05/30/2015  . BACK SURGERY    . BASAL CELL CARCINOMA EXCISION     chest or back or left arm  . CARDIOVERSION  06/2002  . CATARACT EXTRACTION W/ INTRAOCULAR LENS IMPLANT Left ~ 2012  . COLONOSCOPY    . COLONOSCOPY W/ POLYPECTOMY  02/2003  . CYSTOSCOPY W/ STONE MANIPULATION  1980's   "basketted it out; no stent"  . ELECTROPHYSIOLOGIC STUDY N/A 05/30/2015   Procedure: Atrial Fibrillation Ablation;  Surgeon: Will Meredith Leeds, MD;  Location: [place] [facility];  Service: Cardiovascular;  Laterality: N/A;  . INGUINAL HERNIA REPAIR Bilateral  2004  . MOHS SURGERY Right    "temple"  . SQUAMOUS CELL CARCINOMA EXCISION     chest or back or left arm    reports that he quit smoking about 33 years ago. His smoking use included Cigarettes. He has a 51.00 pack-year smoking history. He has never used smokeless tobacco. He reports that he does not drink alcohol or use drugs. family history includes Cancer in his brother; Colon cancer (age of onset: 65) in his cousin; Heart failure in his mother; Stroke in his brother. Allergies  Allergen Reactions  . Pneumococcal Vaccines Swelling    Shoulder to elbow swelling  . Tape Other (See Comments)    Please use "paper" tape    Outpatient Medications Prior to Visit  Medication Sig Dispense Refill  . acyclovir (ZOVIRAX) 400 MG tablet TAKE 1 TABLET THREE TIMES A DAY 90 tablet 2  . atorvastatin (LIPITOR) 80 MG tablet Take 1 tablet (80 mg total) by mouth daily. 90 tablet 3  . cholecalciferol (VITAMIN D) 1000 UNITS tablet Take 1,000 Units by mouth daily.    . cycloSPORINE (RESTASIS) 0.05 % ophthalmic emulsion Place 1 drop into both eyes 2 (two) times daily.    Marland Kitchen diltiazem (CARDIZEM CD) 120 MG 24 hr capsule Take 1 capsule (120 mg total) by mouth daily. 90 capsule 2  . ELIQUIS 5 MG TABS tablet TAKE 1 TABLET (5 MG  TOTAL) BY MOUTH 2 (TWO) TIMES DAILY. 180 tablet 1  . finasteride (PROSCAR) 5 MG tablet Take 5 mg by mouth daily.    . hydroxypropyl methylcellulose / hypromellose (ISOPTO TEARS / GONIOVISC) 2.5 % ophthalmic solution Place 1 drop into both eyes daily as needed for dry eyes.    Marland Kitchen levothyroxine (SYNTHROID, LEVOTHROID) 25 MCG tablet TAKE 1 TABLET BY MOUTH EVERY DAY BEFORE BREAKFAST 90 tablet 1  . Multiple Vitamins-Minerals (PRESERVISION AREDS PO) Take 1 mg by mouth 2 (two) times daily.     Marland Kitchen omeprazole (PRILOSEC) 40 MG capsule Take 1 capsule (40 mg total) by mouth daily. 90 capsule 11  . tamsulosin (FLOMAX) 0.4 MG CAPS capsule TAKE 1 CAPSULE (0.4 MG TOTAL) BY MOUTH DAILY. 90 capsule 3  .  Vitamins-Lipotropics (LIPOFLAVONOID PO) Take 1 tablet by mouth 3 (three) times daily.     Marland Kitchen acetaminophen (TYLENOL) 325 MG tablet Take 325 mg by mouth 3 (three) times daily as needed for mild pain, moderate pain or headache.      No facility-administered medications prior to visit.     ROS Review of Systems  Constitutional: Negative for appetite change, diaphoresis, fatigue and unexpected weight change.  HENT: Negative.   Eyes: Negative for photophobia and visual disturbance.  Respiratory: Negative for apnea, cough, chest tightness, shortness of breath, wheezing and stridor.   Cardiovascular: Negative.  Negative for chest pain, palpitations and leg swelling.  Gastrointestinal: Negative for abdominal pain, constipation, diarrhea, nausea and vomiting.  Endocrine: Negative.  Negative for cold intolerance and heat intolerance.  Genitourinary: Negative.  Negative for difficulty urinating, dysuria, frequency, penile pain, penile swelling and scrotal swelling.  Musculoskeletal: Negative.  Negative for back pain and myalgias.  Skin: Negative.   Allergic/Immunologic: Negative.   Neurological: Negative.  Negative for dizziness, syncope, weakness and light-headedness.  Hematological: Negative for adenopathy. Does not bruise/bleed easily.  Psychiatric/Behavioral: Negative.     Objective:  BP 138/64 (BP Location: Left Arm, Patient Position: Sitting, Cuff Size: Normal)   Pulse (!) 57   Temp 98 F (36.7 C) (Oral)   Ht 5\' 8"  (1.727 m)   Wt 125 lb 4 oz (56.8 kg)   SpO2 98%   BMI 19.04 kg/m   BP Readings from Last 3 Encounters:  03/18/16 138/64  03/01/16 128/64  01/01/16 124/62    Wt Readings from Last 3 Encounters:  03/18/16 125 lb 4 oz (56.8 kg)  03/01/16 127 lb 9.6 oz (57.9 kg)  01/01/16 123 lb 12.8 oz (56.2 kg)    Physical Exam  Constitutional: No distress.  HENT:  Mouth/Throat: Oropharynx is clear and moist. No oropharyngeal exudate.  Eyes: Conjunctivae are normal. Right eye  exhibits no discharge. Left eye exhibits no discharge. No scleral icterus.  Neck: Normal range of motion. Neck supple. No JVD present. No tracheal deviation present. No thyromegaly present.  Cardiovascular: Normal rate, regular rhythm, normal heart sounds and intact distal pulses.  Exam reveals no gallop and no friction rub.   No murmur heard. Pulmonary/Chest: Effort normal and breath sounds normal. No stridor. No respiratory distress. He has no wheezes. He has no rales. He exhibits no tenderness.  Abdominal: Soft. Bowel sounds are normal. He exhibits no distension and no mass. There is no tenderness. There is no rebound and no guarding. Hernia confirmed negative in the right inguinal area and confirmed negative in the left inguinal area.  Genitourinary: Rectum normal, prostate normal, testes normal and penis normal. Rectal exam shows no external hemorrhoid, no internal hemorrhoid,  no fissure, no mass, no tenderness, anal tone normal and guaiac negative stool. Prostate is not enlarged and not tender. Right testis shows no mass, no swelling and no tenderness. Right testis is descended. Left testis shows no mass, no swelling and no tenderness. Left testis is descended. Circumcised. No penile erythema or penile tenderness. No discharge found.  Lymphadenopathy:    He has no cervical adenopathy.       Right: No inguinal adenopathy present.       Left: No inguinal adenopathy present.  Skin: He is not diaphoretic.  Vitals reviewed.   Lab Results  Component Value Date   WBC 6.6 03/18/2016   HGB 15.3 03/18/2016   HCT 44.3 03/18/2016   PLT 226.0 03/18/2016   GLUCOSE 97 03/18/2016   CHOL 97 03/18/2016   TRIG 68.0 03/18/2016   HDL 40.90 03/18/2016   LDLCALC 42 03/18/2016   ALT 23 03/18/2016   AST 19 03/18/2016   NA 142 03/18/2016   K 4.2 03/18/2016   CL 105 03/18/2016   CREATININE 0.83 03/18/2016   BUN 12 03/18/2016   CO2 32 03/18/2016   TSH 2.87 03/18/2016   PSA 0.15 03/18/2016   INR 1.0  04/15/2011   HGBA1C 5.1 04/15/2011    Nm Gastric Emptying  Result Date: 11/05/2015 CLINICAL DATA:  DYSPHAGIA,BLOATING,EARLY SATIETY,REFLUX SINCE 3/3/3017ABDOMINAL PAIN, IMPROVED SINCE 3/2017NO DM, NO H/H, NO SX EXAM: NUCLEAR MEDICINE GASTRIC EMPTYING SCAN TECHNIQUE: After oral ingestion of radiolabeled meal, sequential abdominal images were obtained for 120 minutes. Residual percentage of activity remaining within the stomach was calculated at 60 and 120 minutes. RADIOPHARMACEUTICALS:  2.2 mCi Tc-67m MDP labeled sulfur colloid orally COMPARISON:  None. FINDINGS: Expected location of the stomach in the left upper quadrant. Ingested meal empties the stomach gradually over the course of the study with 69% retention at 60 min and 34% retention at 120 min (normal retention less than 30% at a 120 min). IMPRESSION: 1. Slight delay in gastric emptying. No evidence gastric outlet obstruction. Findings could reflect a mild degree of gastroparesis. Electronically Signed   By: Lajean Manes M.D.   On: 11/05/2015 14:20    Assessment & Plan:   Mattias was seen today for annual exam, hypertension, hypothyroidism and hyperlipidemia.  Diagnoses and all orders for this visit:  Coronary artery disease involving native coronary artery of native heart without angina pectoris- he has had no recent episodes of angina and has good exercise endurance. We will continue statin therapy for cardiovascular risk reduction. -     Lipid panel; Future  Essential hypertension- his blood pressure is adequately well-controlled, electrolytes and renal function are normal. -     Comprehensive metabolic panel; Future -     CBC with Differential/Platelet; Future -     Urinalysis, Routine w reflex microscopic; Future  Acquired hypothyroidism- his TSH is in the normal range, he will remain on the current dose of levothyroxine. -     TSH; Future  Benign prostatic hyperplasia without lower urinary tract symptoms- his PSA remains low so  I'm not concerned about prostate cancer, he has no symptoms that need to be treated. He is doing well on Proscar. -     PSA; Future  Hyperlipidemia with target LDL less than 70- he is achieved his LDL goal is doing well on the statin. -     Lipid panel; Future  Other microscopic hematuria- he has asymptomatic, microscopic hematuria, a CT scan shows a 6 mm stone on the left side. He  is asymptomatic and this is small enough to pass. He knows to seek treatment urgently if he develops any flank or abdominal pain or other concerning symptoms. -     CT RENAL STONE STUDY; Future   I have discontinued Mr. Silverstone acetaminophen. I am also having him maintain his Vitamins-Lipotropics (LIPOFLAVONOID PO), Multiple Vitamins-Minerals (PRESERVISION AREDS PO), cycloSPORINE, cholecalciferol, hydroxypropyl methylcellulose / hypromellose, finasteride, diltiazem, omeprazole, levothyroxine, atorvastatin, ELIQUIS, tamsulosin, and acyclovir.  No orders of the defined types were placed in this encounter.  See AVS for instructions about healthy living and anticipatory guidance.  Follow-up: Return in about 6 months (around 09/16/2016).  [name], MD

## 2016-03-18 NOTE — Patient Instructions (Signed)

## 2016-03-23 ENCOUNTER — Encounter: Payer: Self-pay | Admitting: Internal Medicine

## 2016-03-23 ENCOUNTER — Ambulatory Visit (INDEPENDENT_AMBULATORY_CARE_PROVIDER_SITE_OTHER)
Admission: RE | Admit: 2016-03-23 | Discharge: 2016-03-23 | Disposition: A | Discharge status: Home / Self Care | Payer: Medicare Other | Source: Ambulatory Visit | Attending: Internal Medicine | Admitting: Internal Medicine

## 2016-03-23 DIAGNOSIS — R3129 Other microscopic hematuria: Secondary | ICD-10-CM

## 2016-03-24 ENCOUNTER — Telehealth: Payer: Self-pay | Admitting: Emergency Medicine

## 2016-03-24 NOTE — Telephone Encounter (Signed)
Pt called and asked that you give him a call back about his kidney stone. He has a few questions about it. Thanks.

## 2016-03-24 NOTE — Telephone Encounter (Signed)
Contact pt and he wanted to know where the stone was and what to do about it.   Informed pt that the stone was in fact a kidney stone and to go the ED once the pain starts. Informed that they would be able to control pain and to also break up the stone if needed. Pt stated understanding.

## 2016-03-24 NOTE — Assessment & Plan Note (Signed)

## 2016-03-30 ENCOUNTER — Other Ambulatory Visit: Payer: Self-pay | Admitting: Internal Medicine

## 2016-03-30 DIAGNOSIS — N200 Calculus of kidney: Secondary | ICD-10-CM | POA: Insufficient documentation

## 2016-04-01 ENCOUNTER — Ambulatory Visit: Payer: Medicare Other | Admitting: Internal Medicine

## 2016-04-02 ENCOUNTER — Telehealth: Payer: Self-pay | Admitting: Cardiology

## 2016-04-02 ENCOUNTER — Other Ambulatory Visit: Payer: Self-pay | Admitting: Urology

## 2016-04-02 NOTE — Telephone Encounter (Signed)
Pt having Extracorporeal Shock Wave Lithotripsy on the 15th.  States that [facility] sent request to our office for instructions of stopping Eliquis prior to procedure. Informed pt I have not received a fax from them yet.  He understands I will call their office on Monday if I do not receive anything from them by then. He thanks me for helping.

## 2016-04-02 NOTE — Telephone Encounter (Signed)
Roy Long is calling because he is scheduled for a procedure scheduled on 04/12/16 and would like some advice on when he should stop taking Eliquis. Please call, thanks.

## 2016-04-05 NOTE — Telephone Encounter (Signed)
Faxed clearance to stop Eliquis 48 hr prior to procedure on 1/18.  Faxed to [facility] [phone]. Informed patient information was faxed.

## 2016-04-09 ENCOUNTER — Encounter (HOSPITAL_COMMUNITY): Payer: Self-pay | Admitting: *Deleted

## 2016-04-12 ENCOUNTER — Encounter (HOSPITAL_COMMUNITY)
Admission: RE | Disposition: A | Discharge status: Home / Self Care | Payer: Self-pay | Source: Ambulatory Visit | Attending: Urology

## 2016-04-12 ENCOUNTER — Ambulatory Visit (HOSPITAL_COMMUNITY)
Admission: RE | Admit: 2016-04-12 | Discharge: 2016-04-12 | Disposition: A | Discharge status: Home / Self Care | Payer: Medicare Other | Source: Ambulatory Visit | Attending: Urology | Admitting: Urology

## 2016-04-12 ENCOUNTER — Encounter (HOSPITAL_COMMUNITY): Payer: Self-pay

## 2016-04-12 ENCOUNTER — Ambulatory Visit (HOSPITAL_COMMUNITY): Payer: Medicare Other

## 2016-04-12 DIAGNOSIS — M1990 Unspecified osteoarthritis, unspecified site: Secondary | ICD-10-CM | POA: Diagnosis not present

## 2016-04-12 DIAGNOSIS — E039 Hypothyroidism, unspecified: Secondary | ICD-10-CM | POA: Diagnosis not present

## 2016-04-12 DIAGNOSIS — N400 Enlarged prostate without lower urinary tract symptoms: Secondary | ICD-10-CM | POA: Diagnosis not present

## 2016-04-12 DIAGNOSIS — I4891 Unspecified atrial fibrillation: Secondary | ICD-10-CM | POA: Insufficient documentation

## 2016-04-12 DIAGNOSIS — E7800 Pure hypercholesterolemia, unspecified: Secondary | ICD-10-CM | POA: Insufficient documentation

## 2016-04-12 DIAGNOSIS — Z87891 Personal history of nicotine dependence: Secondary | ICD-10-CM | POA: Diagnosis not present

## 2016-04-12 DIAGNOSIS — N200 Calculus of kidney: Secondary | ICD-10-CM | POA: Diagnosis not present

## 2016-04-12 DIAGNOSIS — Z8249 Family history of ischemic heart disease and other diseases of the circulatory system: Secondary | ICD-10-CM | POA: Insufficient documentation

## 2016-04-12 DIAGNOSIS — I10 Essential (primary) hypertension: Secondary | ICD-10-CM | POA: Diagnosis not present

## 2016-04-12 DIAGNOSIS — Z7901 Long term (current) use of anticoagulants: Secondary | ICD-10-CM | POA: Diagnosis not present

## 2016-04-12 HISTORY — PX: EXTRACORPOREAL SHOCK WAVE LITHOTRIPSY: SHX1557

## 2016-04-12 SURGERY — LITHOTRIPSY, ESWL
Anesthesia: LOCAL | Laterality: Left

## 2016-04-12 MED ORDER — SODIUM CHLORIDE 0.9 % IV SOLN
INTRAVENOUS | Status: DC
  Administered 2016-04-12: 10:00:00 via INTRAVENOUS

## 2016-04-12 MED ORDER — DIAZEPAM 5 MG PO TABS
10.0000 mg | ORAL_TABLET | ORAL | Status: AC
  Administered 2016-04-12: 10 mg via ORAL
  Filled 2016-04-12: qty 2

## 2016-04-12 MED ORDER — DIPHENHYDRAMINE HCL 25 MG PO CAPS
25.0000 mg | ORAL_CAPSULE | ORAL | Status: AC
  Administered 2016-04-12: 25 mg via ORAL
  Filled 2016-04-12: qty 1

## 2016-04-12 MED ORDER — CIPROFLOXACIN HCL 500 MG PO TABS
500.0000 mg | ORAL_TABLET | ORAL | Status: AC
  Administered 2016-04-12: 500 mg via ORAL
  Filled 2016-04-12: qty 1

## 2016-04-12 NOTE — Progress Notes (Signed)
Pt returned form [facility] at 1110. He had been drinking (332ml po) and I liter of IVF. He is having minimal pain. He has been up to BR x3 but unable to void. Return to chair and bladder scan done

## 2016-04-12 NOTE — H&P (Signed)
CC: I have kidney stones.  HPI: Roy Long is a 66 year-old male patient who was referred by Dr. Janith Lima, MD who is here for renal calculi.  The problem is on the left side. This is not his first kidney stone. He is not currently having flank pain, back pain, groin pain, nausea, vomiting, fever or chills. He has not caught a stone in his urine strainer since his symptoms began.   He has had ureteroscopy for treatment of his stones in the past.   Patient with 6 mm left lower pole stone seen on CT scan in December 2017. CT performed for microhematuria. Patient denies flank pain this time. He is very concerned as he passed a stone 30 years ago required surgery. From this incredibly painful and does not want to go through this again. He is requesting treatment for the stone.     CC: BPH New Patient  HPI: Patient is currently treated with flomax for his symptoms.   PSA 0.15 in December 2017 (on finasteride). The patient takes Flomax for his urinary symptoms which are minimal. He is also on finasteride but he is on this to prevent hair loss not for urinary symptoms.     AUA Symptom Score: Less than 50% of the time he has the sensation of not emptying his bladder completely when finished urinating. He never has to urinate again less that two hours after he has finished urinating. He does not have to stop and start again several times when he urinates. He never finds it difficult to postpone urination. Less than 50% of the time he has a weak urinary stream. He never has to push or strain to begin urination. He has to get up to urinate 2 times from the time he goes to bed until the time he gets up in the morning.   Calculated AUA Symptom Score: 6    ALLERGIES:     MEDICATIONS: Finasteride 5 mg tablet  Levothyroxine Sodium 25 mcg tablet  Omeprazole 40 mg capsule,delayed release  Tamsulosin Hcl 0.4 mg capsule, ext release 24 hr  Acyclovir 400 mg tablet  Atorvastatin Calcium 80 mg  tablet  Diltiazem 12Hr Er 120 mg capsule, extended release 12 hr  Eliquis 5 mg tablet  Restasis     GU PSH: Cysto Uretero Remove Stone - 1983    NON-GU PSH: Hernia Repair - 2004 Neck Spinal Fusion - 2011    GU PMH: None   NON-GU PMH: Anxiety Arrhythmia Arthritis Atrial Fibrillation GERD Heart disease, unspecified Hypercholesterolemia Hypertension Hypothyroidism    FAMILY HISTORY: Dementia - Father Diabetes - Father, Aunt Encephalitis - Father father deceased at age 24 - Father heart failure - Mother mother deceased at age 74 - Mother   SOCIAL HISTORY: Marital Status: Single Current Smoking Status: Patient does not smoke anymore. Has not smoked since 03/29/1984. Smoked for 17 years. Smoked 2 packs per day.   Tobacco Use Assessment Completed: Used Tobacco in last 30 days? Does not drink anymore.  Drinks 3 caffeinated drinks per day. 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    REVIEW OF SYSTEMS:    GU Review Male:   Patient denies frequent urination, hard to postpone urination, burning/ pain with urination, get up at night to urinate, leakage of urine, stream starts and stops, trouble starting your stream, have to strain to urinate , erection problems, and penile pain.  Gastrointestinal (Upper):   Patient reports indigestion/ heartburn. Patient denies nausea and vomiting.  Gastrointestinal (Lower):  Patient reports diarrhea. Patient denies constipation.  Constitutional:   Patient reports weight loss and night sweats. Patient denies fever and fatigue.  Skin:   Patient denies skin rash/ lesion and itching.  Eyes:   Patient denies blurred vision and double vision.  Ears/ Nose/ Throat:   Patient denies sore throat and sinus problems.  Hematologic/Lymphatic:   Patient denies swollen glands and easy bruising.  Cardiovascular:   Patient denies leg swelling and chest pains.  Respiratory:   Patient reports shortness of breath. Patient denies cough.  Endocrine:   Patient  denies excessive thirst.  Musculoskeletal:   Patient denies back pain and joint pain.  Neurological:   Patient denies headaches and dizziness.  Psychologic:   Patient reports depression. Patient denies anxiety.   VITAL SIGNS:      04/02/2016 09:49 AM  Weight 128 lb / 58.06 kg  Height 68 in / 172.72 cm  BP 153/79 mmHg  Pulse 82 /min  Temperature 97.4 F / 36 C  BMI 19.5 kg/m   GU PHYSICAL EXAMINATION:    Prostate: Prostate 1 + size. Left lobe normal consistency, right lobe normal consistency. Symmetrical lobes. No prostate nodule. Left lobe no tenderness, right lobe no tenderness.    MULTI-SYSTEM PHYSICAL EXAMINATION:    Constitutional: Well-nourished. No physical deformities. Normally developed. Good grooming.  Neck: Neck symmetrical, not swollen. Normal tracheal position.  Respiratory: No labored breathing, no use of accessory muscles.   Cardiovascular: Normal temperature, normal extremity pulses, no swelling, no varicosities.  Lymphatic: No enlargement of neck, axillae, groin.  Skin: No paleness, no jaundice, no cyanosis. No lesion, no ulcer, no rash.  Neurologic / Psychiatric: Oriented to time, oriented to place, oriented to person. No depression, no anxiety, no agitation.  Gastrointestinal: No mass, no tenderness, no rigidity, non obese abdomen.  Eyes: Normal conjunctivae. Normal eyelids.  Ears, Nose, Mouth, and Throat: Left ear no scars, no lesions, no masses. Right ear no scars, no lesions, no masses. Nose no scars, no lesions, no masses. Normal hearing. Normal lips.  Musculoskeletal: Normal gait and station of head and neck.     PAST DATA REVIEWED:  Source Of History:  Patient  Lab Test Review:   PSA  Records Review:   Previous Doctor Records  Urine Test Review:   Urinalysis  X-Ray Review: C.T. Stone Protocol: Reviewed Films. Reviewed Report. Discussed With Patient.     PROCEDURES:         KUB - [id number]  A single view of the abdomen is obtained.  Calculi:  Lower pole  left kidney calculi. Left calculi size: 6 mm               Urinalysis w/Scope Dipstick Dipstick Cont'd Micro  Color: Yellow Bilirubin: Neg WBC/hpf: NS (Not Seen)  Appearance: Clear Ketones: Neg RBC/hpf: 20 - 40/hpf  Specific Gravity: 1.020 Blood: 3+ Bacteria: Rare (0-9/hpf)  pH: 6.0 Protein: Neg Cystals: NS (Not Seen)  Glucose: Neg Urobilinogen: 0.2 Casts: NS (Not Seen)    Nitrites: Neg Trichomonas: Not Present    Leukocyte Esterase: Neg Mucous: Not Present      Epithelial Cells: NS (Not Seen)      Yeast: NS (Not Seen)      Sperm: Not Present    Notes: microscopic performed on unspun urine    ASSESSMENT:      ICD-10 Details  1 GU:   Kidney Stone - N20.0   2   BPH w/o LUTS - N40.0   3   Encounter  for Prostate Cancer screening - Z12.5   4   Asymptomatic microscopic hematuria - R31.21    PLAN:           Orders X-Rays: KUB          Schedule         Document Letter(s):  Created for Janith Lima, MD   Created for Patient: Clinical Summary    The patient and I talked at length about surgical treatment for their kidney stones. Etiologies of kidney stones including dehydration, poor fluid intake, intake of well water, congenital renal disease, previous bowel surgery, idiopathic, and others were discussed with the patient.   Metabolic evaluation of kidney stone disease was discussed with the patient. Alternative treatment options including increased water intake, increased lemonade intake, and dietary moderation with calcium and oxalate containing foods were discussed with the patient in detail.   The risks, benefits, and some of the possible complications of surgical treatments including cystoscopy, ureteroscopy, renoscopy, laser lithotripsy, extracorporeal shock wave lithotripsy, stent insertion and others were discussed with the patient. All questions were answered.  The patient gave fully informed consent to proceed with an extracorporeal shock wave lithotripsy for the  treatment of their kidney stones.  The patient was given instructions to call for abdominal pain, pelvic pain, perirectal pain, nausea, vomiting, diarrhea, fever over 100 degrees F, chills, hematuria, dysuria, frequency, urgency, or urge incontinence.         Notes:   1. Left renal stone  I discussed with the patient options for treatment of the stone which include active surveillance, lithotripsy, and ureteroscopy. He is very concerned about having to pass a stone in the future. We did talk about ways to treat including ureteroscopy and lithotripsy. After discussing the risks and benefits of both, the patient elected to undergo lithotripsy. His stone is visible on KUB performed today. He is recovering alcoholic and requests only minimal pain medication is given at the time of surgery.   2. Microscopic hematuria  -ensure resolution after treating above   3. BPH  -continue flomax   4. Prostate cancer screening  up to date

## 2016-04-12 NOTE — Discharge Instructions (Signed)
Lithotripsy, Care After °Refer to this sheet in the next few weeks. These instructions provide you with information on caring for yourself after your procedure. Your health care provider may also give you more specific instructions. Your treatment has been planned according to current medical practices, but problems sometimes occur. Call your health care provider if you have any problems or questions after your procedure. °WHAT TO EXPECT AFTER THE PROCEDURE  °· Your urine may have a red tinge for a few days after treatment. Blood loss is usually minimal. °· You may have soreness in the back or flank area. This usually goes away after a few days. The procedure can cause blotches or bruises on the back where the pressure wave enters the skin. These marks usually cause only minimal discomfort and should disappear in a short time. °· Stone fragments should begin to pass within 24 hours of treatment. However, a delayed passage is not unusual. °· You may have pain, discomfort, and feel sick to your stomach (nauseated) when the crushed fragments of stone are passed down the tube from the kidney to the bladder. Stone fragments can pass soon after the procedure and may last for up to 4-8 weeks. °· A small number of patients may have severe pain when stone fragments are not able to pass, which leads to an obstruction. °· If your stone is greater than 1 inch (2.5 cm) in diameter or if you have multiple stones that have a combined diameter greater than 1 inch (2.5 cm), you may require more than one treatment. °· If you had a stent placed prior to your procedure, you may experience some discomfort, especially during urination. You may experience the pain or discomfort in your flank or back, or you may experience a sharp pain or discomfort at the base of your penis or in your lower abdomen. The discomfort usually lasts only a few minutes after urinating. °HOME CARE INSTRUCTIONS  °· Rest at home until you feel your energy  improving. °· Only take over-the-counter or prescription medicines for pain, discomfort, or fever as directed by your health care provider. Depending on the type of lithotripsy, you may need to take antibiotics and anti-inflammatory medicines for a few days. °· Drink enough water and fluids to keep your urine clear or pale yellow. This helps "flush" your kidneys. It helps pass any remaining pieces of stone and prevents stones from coming back. °· Most people can resume daily activities within 1-2 days after standard lithotripsy. It can take longer to recover from laser and percutaneous lithotripsy. °· Strain all urine through the provided strainer. Keep all particulate matter and stones for your health care provider to see. The stone may be as small as a grain of salt. It is very important to use the strainer each and every time you pass your urine. Any stones that are found can be sent to a medical lab for examination. °· Visit your health care provider for a follow-up appointment in a few weeks. Your doctor may remove your stent if you have one. Your health care provider will also check to see whether stone particles still remain. °SEEK MEDICAL CARE IF:  °· Your pain is not relieved by medicine. °· You have a lasting nauseous feeling. °· You feel there is too much blood in the urine. °· You develop persistent problems with frequent or painful urination that does not at least partially improve after 2 days following the procedure. °· You have a congested cough. °· You feel   lightheaded.  You develop a rash or any other signs that might suggest an allergic problem.  You develop any reaction or side effects to your medicine(s). SEEK IMMEDIATE MEDICAL CARE IF:   You experience severe back or flank pain or both.  You see nothing but blood when you urinate.  You cannot pass any urine at all.  You have a fever or shaking chills.  You develop shortness of breath, difficulty breathing, or chest pain.  You  develop vomiting that will not stop after 6-8 hours.  You have a fainting episode. This information is not intended to replace advice given to you by your health care provider. Make sure you discuss any questions you have with your health care provider. Document Released: 04/04/2007 Document Revised: 12/04/2014 Document Reviewed: 09/28/2012 Elsevier Interactive Patient Education  2017 [employer].  Moderate Conscious Sedation, Adult Sedation is the use of medicines to promote relaxation and relieve discomfort and anxiety. Moderate conscious sedation is a type of sedation. Under moderate conscious sedation, you are less alert than normal, but you are still able to respond to instructions, touch, or both. Moderate conscious sedation is used during short medical and dental procedures. It is milder than deep sedation, which is a type of sedation under which you cannot be easily woken up. It is also milder than general anesthesia, which is the use of medicines to make you unconscious. Moderate conscious sedation allows you to return to your regular activities sooner. Tell a health care provider about:  Any allergies you have.  All medicines you are taking, including vitamins, herbs, eye drops, creams, and over-the-counter medicines.  Use of steroids (by mouth or creams).  Any problems you or family members have had with sedatives and anesthetic medicines.  Any blood disorders you have.  Any surgeries you have had.  Any medical conditions you have, such as sleep apnea.  Whether you are pregnant or may be pregnant.  Any use of cigarettes, alcohol, marijuana, or street drugs. What are the risks? Generally, this is a safe procedure. However, problems may occur, including:  Getting too much medicine (oversedation).  Nausea.  Allergic reaction to medicines.  Trouble breathing. If this happens, a breathing tube may be used to help with breathing. It will be removed when you are awake and  breathing on your own.  Heart trouble.  Lung trouble. What happens before the procedure? Staying hydrated  Follow instructions from your health care provider about hydration, which may include:  Up to 2 hours before the procedure - you may continue to drink clear liquids, such as water, clear fruit juice, black coffee, and plain tea. Eating and drinking restrictions  Follow instructions from your health care provider about eating and drinking, which may include:  8 hours before the procedure - stop eating heavy meals or foods such as meat, fried foods, or fatty foods.  6 hours before the procedure - stop eating light meals or foods, such as toast or cereal.  6 hours before the procedure - stop drinking milk or drinks that contain milk.  2 hours before the procedure - stop drinking clear liquids. Medicine  Ask your health care provider about:  Changing or stopping your regular medicines. This is especially important if you are taking diabetes medicines or blood thinners.  Taking medicines such as aspirin and ibuprofen. These medicines can thin your blood. Do not take these medicines before your procedure if your health care provider instructs you not to. Tests and exams  You  will have a physical exam.  You may have blood tests done to show:  How well your kidneys and liver are working.  How well your blood can clot. General instructions  Plan to have someone take you home from the hospital or clinic.  If you will be going home right after the procedure, plan to have someone with you for 24 hours. What happens during the procedure?  An IV tube will be inserted into one of your veins.  Medicine to help you relax (sedative) will be given through the IV tube.  The medical or dental procedure will be performed. What happens after the procedure?  Your blood pressure, heart rate, breathing rate, and blood oxygen level will be monitored often until the medicines you were given  have worn off.  Do not drive for 24 hours. This information is not intended to replace advice given to you by your health care provider. Make sure you discuss any questions you have with your health care provider. Document Released: 12/08/2000 Document Revised: 08/19/2015 Document Reviewed: 07/05/2015 Elsevier Interactive Patient Education  2017 [employer].

## 2016-04-17 ENCOUNTER — Other Ambulatory Visit: Payer: Self-pay | Admitting: Internal Medicine

## 2016-05-01 IMAGING — NM NM MISC PROCEDURE
8 series · 48 of 48 positions shown · non-contrast
Comparison: none

[Series 1: rest · 6.51mm/px · 6 of 64 frames shown (1 of 2)]
[frame 6/64]
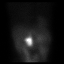
[frame 16/64]
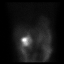
[frame 27/64]
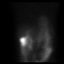
[frame 38/64]
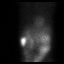
[frame 48/64]
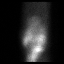
[frame 59/64]
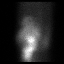

[Series 1: wbr_r-proj_st rest · 6.51mm/px · 6 of 64 frames shown (1 of 2)]
[frame 6/64]
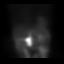
[frame 16/64]
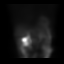
[frame 27/64]
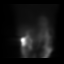
[frame 38/64]
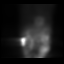
[frame 48/64]
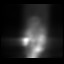
[frame 59/64]
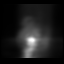

[Series 2: rest · 6.51mm/px · 6 of 64 frames shown (2 of 2)]
[frame 6/64]
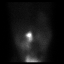
[frame 16/64]
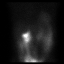
[frame 27/64]
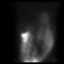
[frame 38/64]
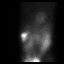
[frame 48/64]
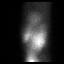
[frame 59/64]
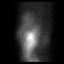

[Series 2: wbr_r-proj_st rest · 6.51mm/px · 6 of 64 frames shown (2 of 2)]
[frame 6/64]
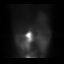
[frame 16/64]
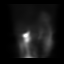
[frame 27/64]
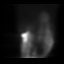
[frame 38/64]
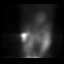
[frame 48/64]
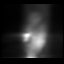
[frame 59/64]
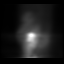

[Series 3: stress · 6.51mm/px · 6 of 64 frames shown (1 of 2)]
[frame 6/64]
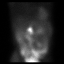
[frame 16/64]
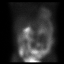
[frame 27/64]
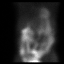
[frame 38/64]
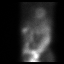
[frame 48/64]
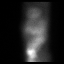
[frame 59/64]
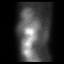

[Series 3: wbr_s-proj_st stress · 6.51mm/px · 6 of 512 frames shown (1 of 2)]
[frame 43/512]
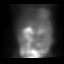
[frame 128/512]
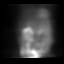
[frame 214/512]
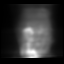
[frame 299/512]
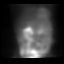
[frame 384/512]
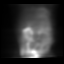
[frame 470/512]
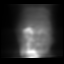

[Series 3: wbr_s-proj_st stress · 6.51mm/px · 6 of 64 frames shown (2 of 2)]
[frame 6/64]
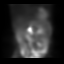
[frame 16/64]
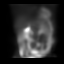
[frame 27/64]
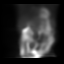
[frame 38/64]
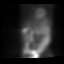
[frame 48/64]
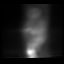
[frame 59/64]
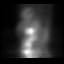

[Series 3: stress · 6.51mm/px · 6 of 512 frames shown (2 of 2)]
[frame 43/512  full-range]
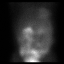
[frame 128/512  full-range]
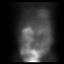
[frame 214/512  full-range]
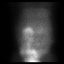
[frame 299/512  full-range]
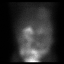
[frame 384/512  full-range]
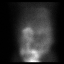
[frame 470/512  full-range]
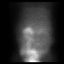

[48 of 48 positions shown; findings below may reference images not displayed]

Canned report from images found in remote index.

Refer to host system for actual result text.

## 2016-05-03 ENCOUNTER — Other Ambulatory Visit: Payer: Self-pay | Admitting: Cardiology

## 2016-05-03 ENCOUNTER — Other Ambulatory Visit: Payer: Self-pay | Admitting: Internal Medicine

## 2016-05-03 MED ORDER — ACYCLOVIR 400 MG PO TABS: 400.0000 mg | ORAL_TABLET | Freq: Three times a day (TID) | ORAL | 3 refills | Status: DC

## 2016-05-18 ENCOUNTER — Other Ambulatory Visit: Payer: Self-pay | Admitting: Cardiology

## 2016-05-27 ENCOUNTER — Telehealth: Payer: Self-pay | Admitting: Cardiology

## 2016-05-27 ENCOUNTER — Encounter (HOSPITAL_COMMUNITY): Payer: Self-pay | Admitting: Nurse Practitioner

## 2016-05-27 ENCOUNTER — Ambulatory Visit (HOSPITAL_COMMUNITY)
Admission: RE | Admit: 2016-05-27 | Discharge: 2016-05-27 | Disposition: A | Discharge status: Home / Self Care | Payer: Medicare Other | Source: Ambulatory Visit | Attending: Nurse Practitioner | Admitting: Nurse Practitioner

## 2016-05-27 VITALS — BP 122/78 | HR 83 | Ht 68.0 in | Wt 127.4 lb

## 2016-05-27 DIAGNOSIS — R9431 Abnormal electrocardiogram [ECG] [EKG]: Secondary | ICD-10-CM | POA: Diagnosis not present

## 2016-05-27 DIAGNOSIS — I481 Persistent atrial fibrillation: Secondary | ICD-10-CM | POA: Insufficient documentation

## 2016-05-27 DIAGNOSIS — Z7901 Long term (current) use of anticoagulants: Secondary | ICD-10-CM | POA: Insufficient documentation

## 2016-05-27 DIAGNOSIS — Z79899 Other long term (current) drug therapy: Secondary | ICD-10-CM | POA: Diagnosis not present

## 2016-05-27 DIAGNOSIS — Z87891 Personal history of nicotine dependence: Secondary | ICD-10-CM | POA: Diagnosis not present

## 2016-05-27 DIAGNOSIS — R002 Palpitations: Secondary | ICD-10-CM

## 2016-05-27 NOTE — Telephone Encounter (Signed)
New message       Pt has AFIB.  Talk to the nurse regarding his "delayed" heartbeats.  Please advise

## 2016-05-27 NOTE — Telephone Encounter (Signed)
Pt calls in reporting skipped beats.  He is "noticing it in my neck first".  He describes having a few regular beats then there is a delay, and then a few regular beats again. States that he has never felt anything like this before, this is not like before.  He says that it is not fast like when he was in [place]. Appt made to see Roderic Palau, NP in the AFib clinic this afternoon to address concern/s. Pt agreeable to plan.

## 2016-05-27 NOTE — Progress Notes (Signed)
Patient ID: Roy Long, male   DOB: 01/01/1951, 66 y.o.   MRN: [id number]     Primary Care Physician: Roy Calico, MD Referring Physician: Dr. Donne Long is a 66 y.o. male with a h/o afib ablation x one month ago, 05/30/15, in afib clinic, for complaints of palpitations he felt this am. He states that he never felt this type of beat before. Different from afib. He described this as many regular beats then a skipped beat or a pause. He did not feel bad with this. After he made the appointment to be seen, the palpations stopped. Otherwise, no change in his health or condition.  Today, he denies symptoms of palpitations, orthopnea, PND, lower extremity edema,, presyncope, syncope, or neurologic sequela. Positive for exertional chest pain, mild dyspnea, positional dizziness.The patient is tolerating medications without difficulties and is otherwise without complaint today.   Past Medical History:  Diagnosis Date  . Age-related macular degeneration, dry, left eye   . Alcohol dependence ([place])   . Arthritis    "maybe a little bit in my left knee and right forefinger" (05/30/2015)  . Atrial fibrillation ([place])    ablation 05-2015 with no reoccurance as of 09-2015  . Basal cell carcinoma   . Depression    hx  . Family history of adverse reaction to anesthesia    "father had head injuries and dementia; everytime he was put under less of his mentation came back"  . Gastroparesis   . GERD (gastroesophageal reflux disease)   . History of kidney stones   . Hypercholesterolemia dx'd 02/2015  . Hypertension   . Hypothyroidism   . Kidney stones   . Migraine aura without headache    "very rare now" (05/30/2015)  . Pneumonia ~ 2004  . Squamous carcinoma    hand, chest   Past Surgical History:  Procedure Laterality Date  . ANTERIOR CERVICAL DECOMP/DISCECTOMY FUSION  04/2009  . ATRIAL FIBRILLATION ABLATION  05/30/2015  . BACK SURGERY    . BASAL CELL CARCINOMA EXCISION     chest or back  or left arm  . CARDIOVERSION  06/2002  . CATARACT EXTRACTION W/ INTRAOCULAR LENS IMPLANT Left ~ 2012  . COLONOSCOPY    . COLONOSCOPY W/ POLYPECTOMY  02/2003  . CYSTOSCOPY W/ STONE MANIPULATION  1980's   "basketted it out; no stent"  . ELECTROPHYSIOLOGIC STUDY N/A 05/30/2015   Procedure: Atrial Fibrillation Ablation;  Surgeon: Will Meredith Leeds, MD;  Location: [facility];  Service: Cardiovascular;  Laterality: N/A;  . INGUINAL HERNIA REPAIR Bilateral 2004  . INGUINAL HERNIA REPAIR Bilateral 2004  . MOHS SURGERY Right    "temple"  . SQUAMOUS CELL CARCINOMA EXCISION     chest or back or left arm    Current Outpatient Prescriptions  Medication Sig Dispense Refill  . acyclovir (ZOVIRAX) 400 MG tablet Take 1 tablet (400 mg total) by mouth 3 (three) times daily. (Patient taking differently: Take 400 mg by mouth daily. ) 90 tablet 3  . atorvastatin (LIPITOR) 80 MG tablet Take 1 tablet (80 mg total) by mouth daily. 90 tablet 3  . cholecalciferol (VITAMIN D) 1000 UNITS tablet Take 1,000 Units by mouth daily.    . cycloSPORINE (RESTASIS) 0.05 % ophthalmic emulsion Place 1 drop into both eyes 2 (two) times daily.    Marland Kitchen diltiazem (CARDIZEM CD) 120 MG 24 hr capsule TAKE 1 CAPSULE (120 MG TOTAL) BY MOUTH DAILY. 90 capsule 2  . ELIQUIS 5 MG TABS  tablet TAKE 1 TABLET (5 MG TOTAL) BY MOUTH 2 (TWO) TIMES DAILY. 180 tablet 1  . finasteride (PROSCAR) 5 MG tablet Take 5 mg by mouth daily.    . fluticasone (FLONASE) 50 MCG/ACT nasal spray Place 1 spray into both nostrils daily as needed for allergies or rhinitis.    . hydroxypropyl methylcellulose / hypromellose (ISOPTO TEARS / GONIOVISC) 2.5 % ophthalmic solution Place 1 drop into both eyes daily as needed for dry eyes.    Marland Kitchen levothyroxine (SYNTHROID, LEVOTHROID) 25 MCG tablet Take 1 tablet (25 mcg total) by mouth daily before breakfast. 90 tablet 3  . loratadine (CLARITIN) 10 MG tablet Take 10 mg by mouth daily as needed for allergies.    . Multiple  Vitamins-Minerals (PRESERVISION AREDS PO) Take 1 mg by mouth 2 (two) times daily.     Marland Kitchen omeprazole (PRILOSEC) 40 MG capsule Take 1 capsule (40 mg total) by mouth daily. 90 capsule 11  . tamsulosin (FLOMAX) 0.4 MG CAPS capsule TAKE 1 CAPSULE (0.4 MG TOTAL) BY MOUTH DAILY. 90 capsule 3  . Vitamins-Lipotropics (LIPOFLAVONOID PO) Take 1 tablet by mouth 3 (three) times daily.      No current facility-administered medications for this encounter.     Allergies  Allergen Reactions  . Pneumococcal Vaccines Swelling    Shoulder to elbow swelling  . Tape Other (See Comments)    Please use "paper" tape    Social History   Social History  . Marital status: Single    Spouse name: N/A  . Number of children: 0  . Years of education: N/A   Occupational History  . band teacher     retired   Social History Main Topics  . Smoking status: Former Smoker    Packs/day: 3.00    Years: 17.00    Types: Cigarettes    Quit date: 03/30/1983  . Smokeless tobacco: Never Used  . Alcohol use No     Comment: 05/30/2015 "recovering alcoholic; dry date [id number]"  . Drug use: No     Comment: 03/07/2015 "stopped all drugs back in the 1980's"  . Sexual activity: Not Currently    Birth control/ protection: None   Other Topics Concern  . Not on file   Social History Narrative  . No narrative on file    Family History  Problem Relation Age of Onset  . Stroke Brother   . Cancer Brother     basosquamous cell carcinoma-head  . Heart failure Mother   . Colon cancer Cousin 17  . Heart disease Neg Hx   . Hyperlipidemia Neg Hx   . Hypertension Neg Hx   . Diabetes Neg Hx     ROS- All systems are reviewed and negative except as per the HPI above  Physical Exam: Vitals:   05/27/16 1514  BP: 122/78  Pulse: 83  Weight: 127 lb 6.4 oz (57.8 kg)  Height: 5\' 8"  (1.727 m)    GEN- The patient is well appearing, alert and oriented x 3 today.   Head- normocephalic, atraumatic Eyes-  Sclera clear, conjunctiva  pink Ears- hearing intact Oropharynx- clear Neck- supple, no JVP Lymph- no cervical lymphadenopathy Lungs- Clear to ausculation bilaterally, normal work of breathing Heart- Regular rate and rhythm, no murmurs, rubs or gallops, PMI not laterally displaced GI- soft, NT, ND, + BS Extremities- no clubbing, cyanosis, or edema MS- no significant deformity or atrophy Skin- no rash or lesion Psych- euthymic mood, full affect Neuro- strength and sensation are intact  EKG-SR  at 68 bpm, septal infarct age undetermined, unchanged Epic records reviewed  Assessment and Plan: 1. Persistent afib Maintaining SR s/p ablation He describes was sounds to be premature contractions that have now stopped Reassured these are benign If continues and are frequent, can place a monitor Continue apixaban, diltiazem   afib clinic as needed  Butch Penny C. Skie Vitrano, [facility] [address] [place],  [address] [phone]

## 2016-06-08 ENCOUNTER — Telehealth: Payer: Self-pay | Admitting: Cardiology

## 2016-06-08 NOTE — Telephone Encounter (Signed)
New Message:     Pt would like for you to call him,concerning his Eliquis.

## 2016-06-08 NOTE — Telephone Encounter (Signed)
Pt has upcoming cataract surgery on 3/26.  Inquiring as to when he should stop Eliquis. Informed pt that I would address clearance/Eliquis w/ Dr. [name] when he returns to the office on 3/19. Patient verbalized understanding and agreeable to plan.

## 2016-06-22 ENCOUNTER — Encounter (HOSPITAL_COMMUNITY): Payer: Self-pay | Admitting: Urology

## 2016-06-28 NOTE — Telephone Encounter (Signed)
Late entry:  Completed 3/23.  See scanned doc

## 2016-07-28 ENCOUNTER — Telehealth: Payer: Self-pay | Admitting: Cardiology

## 2016-07-28 NOTE — Telephone Encounter (Signed)
New Message:   Pt needs to know if it is alright for him to take Omega 3 along with his other medicine? His eye doctor wants him to take the Omega 3.

## 2016-07-29 NOTE — Telephone Encounter (Signed)
Advised pt I will forward to Dr. [name] to advise on. Pt aware it may be beginning of next week before hearing back from me.  Patient verbalized understanding and agreeable to plan.

## 2016-07-29 NOTE — Telephone Encounter (Signed)
Follow up    Pt is calling to find out if omega 3 is ok to take with his eliquis. He is asking what is the maximum amount of omega 3 he can use. He would also like to try using melatonin. Is this ok and if so how much can he take?

## 2016-08-02 NOTE — Telephone Encounter (Signed)
Advised pt ok to begin Omega 3 and Melatonin OTC if he wishes, per Dr. [name]. Informed that Omega 3 one g Patient verbalized understanding and agreeable to plan.  Will update patient's chart when he returns to the office, being that he isn't sure what dosage he will begin yet. Pt thanks me for helping.

## 2016-08-31 ENCOUNTER — Encounter: Payer: Self-pay | Admitting: Cardiology

## 2016-08-31 ENCOUNTER — Ambulatory Visit (INDEPENDENT_AMBULATORY_CARE_PROVIDER_SITE_OTHER): Payer: Medicare Other | Admitting: Cardiology

## 2016-08-31 VITALS — BP 110/62 | HR 73 | Ht 68.0 in | Wt 129.2 lb

## 2016-08-31 DIAGNOSIS — K3184 Gastroparesis: Secondary | ICD-10-CM

## 2016-08-31 DIAGNOSIS — I10 Essential (primary) hypertension: Secondary | ICD-10-CM | POA: Diagnosis not present

## 2016-08-31 DIAGNOSIS — N200 Calculus of kidney: Secondary | ICD-10-CM | POA: Diagnosis not present

## 2016-08-31 DIAGNOSIS — I480 Paroxysmal atrial fibrillation: Secondary | ICD-10-CM

## 2016-08-31 NOTE — Progress Notes (Signed)
Electrophysiology Office Note   Date:  08/31/2016   ID:  Amine, Adelson 1950/07/19, MRN [id number]  PCP:  Janith Lima, MD  Cardiologist:  Peter Martinique Primary Electrophysiologist:  Constance Haw, MD    Chief Complaint  Patient presents with  . Follow-up    PAF     History of Present Illness: Roy Long is a 66 y.o. male who presents today for electrophysiology evaluation.   He has a history of atrial fibrillation on flecainide, hypertension, hyperlipidemia, depression. He was admitted to [facility] on 12/ 8/ 16 with palpitations noting atrial flutter with 2 to one conduction. He has atrial fibrillation because back to the early 2006 and has also had atrial flutter during that time. He underwent cardioversion in 2003 and has had one to 2 episodes of atrial fibrillation every 6 months control pill the pocket strategy with flecainide. He has had multiple episodes over the last 3-4 months occurring 4-5 times.  He had ablation for AF on 05/30/15.  Today, denies symptoms of chest pain, shortness of breath, orthopnea, PND, lower extremity edema, claudication, dizziness, presyncope, syncope, bleeding, or neurologic sequela. The patient is tolerating medications without difficulties and is otherwise without complaint today. He does have episodes of palpitations. They occur a few times a week. He says that he feels like he is having skipped beats. Otherwise he feels well. He is able to do all of his daily activities. He does say that he has had his kidney stones treated, and has been put on a diet due to kidney stones. He is also on a specific diet due to gastroparesis and is trying to follow heart healthy diet.    Past Medical History:  Diagnosis Date  . Age-related macular degeneration, dry, left eye   . Alcohol dependence ([place])   . Arthritis    "maybe a little bit in my left knee and right forefinger" (05/30/2015)  . Atrial fibrillation ([place])    ablation 05-2015 with no  reoccurance as of 09-2015  . Basal cell carcinoma   . Depression    hx  . Family history of adverse reaction to anesthesia    "father had head injuries and dementia; everytime he was put under less of his mentation came back"  . Gastroparesis   . GERD (gastroesophageal reflux disease)   . History of kidney stones   . Hypercholesterolemia dx'd 02/2015  . Hypertension   . Hypothyroidism   . Kidney stones   . Migraine aura without headache    "very rare now" (05/30/2015)  . Pneumonia ~ 2004  . Squamous carcinoma    hand, chest   Past Surgical History:  Procedure Laterality Date  . ANTERIOR CERVICAL DECOMP/DISCECTOMY FUSION  04/2009  . ATRIAL FIBRILLATION ABLATION  05/30/2015  . BACK SURGERY    . BASAL CELL CARCINOMA EXCISION     chest or back or left arm  . CARDIOVERSION  06/2002  . CATARACT EXTRACTION W/ INTRAOCULAR LENS IMPLANT Left ~ 2012  . COLONOSCOPY    . COLONOSCOPY W/ POLYPECTOMY  02/2003  . CYSTOSCOPY W/ STONE MANIPULATION  1980's   "basketted it out; no stent"  . ELECTROPHYSIOLOGIC STUDY N/A 05/30/2015   Procedure: Atrial Fibrillation Ablation;  Surgeon: Letia Guidry Meredith Leeds, MD;  Location: [facility];  Service: Cardiovascular;  Laterality: N/A;  . EXTRACORPOREAL SHOCK WAVE LITHOTRIPSY Left 04/12/2016   Procedure: LEFT EXTRACORPOREAL SHOCK WAVE LITHOTRIPSY (ESWL);  Surgeon: Irine Seal, MD;  Location: [facility];  Service: Urology;  Laterality: Left;  . INGUINAL HERNIA REPAIR Bilateral 2004  . INGUINAL HERNIA REPAIR Bilateral 2004  . MOHS SURGERY Right    "temple"  . SQUAMOUS CELL CARCINOMA EXCISION     chest or back or left arm     Current Outpatient Prescriptions  Medication Sig Dispense Refill  . acyclovir (ZOVIRAX) 400 MG tablet Take 1 tablet (400 mg total) by mouth 3 (three) times daily. (Patient taking differently: Take 400 mg by mouth daily. ) 90 tablet 3  . atorvastatin (LIPITOR) 80 MG tablet Take 1 tablet (80 mg total) by mouth daily. 90 tablet 3  .  cholecalciferol (VITAMIN D) 1000 UNITS tablet Take 1,000 Units by mouth daily.    . cycloSPORINE (RESTASIS) 0.05 % ophthalmic emulsion Place 1 drop into both eyes 2 (two) times daily.    Marland Kitchen diltiazem (CARDIZEM CD) 120 MG 24 hr capsule TAKE 1 CAPSULE (120 MG TOTAL) BY MOUTH DAILY. 90 capsule 2  . ELIQUIS 5 MG TABS tablet TAKE 1 TABLET (5 MG TOTAL) BY MOUTH 2 (TWO) TIMES DAILY. 180 tablet 1  . finasteride (PROSCAR) 5 MG tablet Take 5 mg by mouth daily.    . fluticasone (FLONASE) 50 MCG/ACT nasal spray Place 1 spray into both nostrils daily as needed for allergies or rhinitis.    . hydroxypropyl methylcellulose / hypromellose (ISOPTO TEARS / GONIOVISC) 2.5 % ophthalmic solution Place 1 drop into both eyes daily as needed for dry eyes.    Marland Kitchen levothyroxine (SYNTHROID, LEVOTHROID) 25 MCG tablet Take 1 tablet (25 mcg total) by mouth daily before breakfast. 90 tablet 3  . loratadine (CLARITIN) 10 MG tablet Take 10 mg by mouth daily as needed for allergies.    . Melatonin CR 3 MG TBCR Take 6 mg by mouth at bedtime.    . Multiple Vitamins-Minerals (PRESERVISION AREDS PO) Take 1 mg by mouth 2 (two) times daily.     . Omega 3 1000 MG CAPS Take 1 capsule by mouth daily.    Marland Kitchen omeprazole (PRILOSEC) 40 MG capsule Take 1 capsule (40 mg total) by mouth daily. 90 capsule 11  . tamsulosin (FLOMAX) 0.4 MG CAPS capsule TAKE 1 CAPSULE (0.4 MG TOTAL) BY MOUTH DAILY. 90 capsule 3  . Vitamins-Lipotropics (LIPOFLAVONOID PO) Take 1 tablet by mouth 3 (three) times daily.      No current facility-administered medications for this visit.     Allergies:   Pneumococcal vaccines and Tape   Social History:  The patient  reports that he quit smoking about 33 years ago. His smoking use included Cigarettes. He has a 51.00 pack-year smoking history. He has never used smokeless tobacco. He reports that he does not drink alcohol or use drugs.   Family History:  The patient's family history includes Cancer in his brother; Colon cancer  (age of onset: 40) in his cousin; Heart failure in his mother; Stroke in his brother.    ROS:  Please see the history of present illness.   Otherwise, review of systems is positive for Palpitations, blood in urine, easy bruising.   All other systems are reviewed and negative.   PHYSICAL EXAM: VS:  BP 110/62   Pulse 73   Ht 5\' 8"  (1.727 m)   Wt 129 lb 3.2 oz (58.6 kg)   SpO2 98%   BMI 19.64 kg/m  , BMI Body mass index is 19.64 kg/m. GEN: Well nourished, well developed, in no acute distress  HEENT: normal  Neck: no JVD, carotid bruits,  or masses Cardiac: RRR; no murmurs, rubs, or gallops,no edema  Respiratory:  clear to auscultation bilaterally, normal work of breathing GI: soft, nontender, nondistended, + BS MS: no deformity or atrophy  Skin: warm and dry Neuro:  Strength and sensation are intact Psych: euthymic mood, full affect  EKG:  EKG is not ordered today. Personal review of the ekg ordered 05/27/16 shows sinus rhythm, septal Q waves, rate 81  Recent Labs: 03/18/2016: ALT 23; BUN 12; Creatinine, Ser 0.83; Hemoglobin 15.3; Platelets 226.0; Potassium 4.2; Sodium 142; TSH 2.87    Lipid Panel     Component Value Date/Time   CHOL 97 03/18/2016 1100   TRIG 68.0 03/18/2016 1100   HDL 40.90 03/18/2016 1100   CHOLHDL 2 03/18/2016 1100   VLDL 13.6 03/18/2016 1100   LDLCALC 42 03/18/2016 1100     Wt Readings from Last 3 Encounters:  08/31/16 129 lb 3.2 oz (58.6 kg)  05/27/16 127 lb 6.4 oz (57.8 kg)  04/12/16 126 lb 9.6 oz (57.4 kg)      Other studies Reviewed: Additional studies/ records that were reviewed today include: TTE and MPI 12/12/14 Review of the above records today demonstrates:  Normal perfusion. LVEF 64% with normal wall motion. Excellent exercise tolerance. This is a low risk study.  - Left ventricle: The cavity size was normal. Systolic function was normal. The estimated ejection fraction was in the range of 60% to 65%. Wall motion was normal; there  were no regional wall motion abnormalities. Doppler parameters are consistent with abnormal left ventricular relaxation (grade 1 diastolic dysfunction). - Mitral valve: Mildly thickened leaflets .   ASSESSMENT AND PLAN:  1. Persistent atrial fibrillation/atrial flutter:  Appears to be in sinus rhythm on exam today. He is having palpitations, which could be secondary to PVCs with compensatory pauses. I told him that if his symptoms increase, Jariah Tarkowski plan to fit him with a monitor likely for one week. Due to his multiple nutrition issues, Kamaile Zachow refer her to a nutritionist.  This patients CHA2DS2-VASc Score and unadjusted Ischemic Stroke Rate (% per year) is equal to 2.2 % stroke rate/year from a score of 2  Above score calculated as 1 point each if present [CHF, HTN, DM, Vascular=MI/PAD/Aortic Plaque, Age if 65-74, or Male] Above score calculated as 2 points each if present [Age > 75, or Stroke/TIA/TE]  2.  Hypertension: Well-controlled today. No changes.  Current medicines are reviewed at length with the patient today.   The patient has concerns regarding his medicines.  The following changes were made today:  none  Labs/ tests ordered today include:  Orders Placed This Encounter  Procedures  . Ambulatory referral to Nutrition and Diabetic Education     Disposition:   FU with Dorian Duval 6 months.  Signed, Zakyia Gagan Meredith Leeds, MD  08/31/2016 4:47 PM     [address] [place] [address] [phone] (office) [phone] (fax)

## 2016-08-31 NOTE — Patient Instructions (Addendum)
Medication Instructions:    Your physician recommends that you continue on your current medications as directed. Please refer to the Current Medication list given to you today.  - If you need a refill on your cardiac medications before your next appointment, please call your pharmacy.   Labwork:  None ordered  Testing/Procedures:  None ordered  Follow-Up:  You have been referred to nutrition. The office will call you to arrange.   Your physician wants you to follow-up in:6 months with Dr. [name].  You will receive a reminder letter in the mail two months in advance. If you don't receive a letter, please call our office to schedule the follow-up appointment.  Thank you for choosing [facility]!!   Trinidad Curet, RN [phone]  Any Other Special Instructions Will Be Listed Below (If Applicable).

## 2016-09-22 ENCOUNTER — Ambulatory Visit (INDEPENDENT_AMBULATORY_CARE_PROVIDER_SITE_OTHER): Payer: Medicare Other | Admitting: Family Medicine

## 2016-09-22 ENCOUNTER — Encounter: Payer: Self-pay | Admitting: Family Medicine

## 2016-09-22 VITALS — BP 144/62 | HR 71 | Temp 98.2°F | Resp 12 | Ht 68.0 in | Wt 128.0 lb

## 2016-09-22 DIAGNOSIS — S40811A Abrasion of right upper arm, initial encounter: Secondary | ICD-10-CM | POA: Diagnosis not present

## 2016-09-22 MED ORDER — MUPIROCIN 2 % EX OINT
1.0000 | TOPICAL_OINTMENT | Freq: Three times a day (TID) | CUTANEOUS | 1 refills | Status: DC
Start: 2016-09-22 — End: 2017-04-05

## 2016-09-22 NOTE — Progress Notes (Signed)
Subjective:    Patient ID: Roy Long, male    DOB: [birth year], 66 y.o.   MRN: [id number]  HPI This is a 66 yo male who presents today with cut on right arm about 2 months ago.Marland Kitchen He cut it with some vinyl siding and it peeled back, was not able to clean it for several hours. Was weepy at first then healed. It continues to feel like something is under the skin. No drainage, no increase in size of lesion or redness.   Has had several skin cancers removed by derm. Has a similar area of firmness under the skin on right hand where he had a rose thorn injury.   Past Medical History:  Diagnosis Date  . Age-related macular degeneration, dry, left eye   . Alcohol dependence ([place])   . Arthritis    "maybe a little bit in my left knee and right forefinger" (05/30/2015)  . Atrial fibrillation ([place])    ablation 05-2015 with no reoccurance as of 09-2015  . Basal cell carcinoma   . Depression    hx  . Family history of adverse reaction to anesthesia    "father had head injuries and dementia; everytime he was put under less of his mentation came back"  . Gastroparesis   . GERD (gastroesophageal reflux disease)   . History of kidney stones   . Hypercholesterolemia dx'd 02/2015  . Hypertension   . Hypothyroidism   . Kidney stones   . Migraine aura without headache    "very rare now" (05/30/2015)  . Pneumonia ~ 2004  . Squamous carcinoma    hand, chest   Past Surgical History:  Procedure Laterality Date  . ANTERIOR CERVICAL DECOMP/DISCECTOMY FUSION  04/2009  . ATRIAL FIBRILLATION ABLATION  05/30/2015  . BACK SURGERY    . BASAL CELL CARCINOMA EXCISION     chest or back or left arm  . CARDIOVERSION  06/2002  . CATARACT EXTRACTION W/ INTRAOCULAR LENS IMPLANT Left ~ 2012  . COLONOSCOPY    . COLONOSCOPY W/ POLYPECTOMY  02/2003  . CYSTOSCOPY W/ STONE MANIPULATION  1980's   "basketted it out; no stent"  . ELECTROPHYSIOLOGIC STUDY N/A 05/30/2015   Procedure: Atrial Fibrillation Ablation;  Surgeon: Will  Meredith Leeds, MD;  Location: [facility];  Service: Cardiovascular;  Laterality: N/A;  . EXTRACORPOREAL SHOCK WAVE LITHOTRIPSY Left 04/12/2016   Procedure: LEFT EXTRACORPOREAL SHOCK WAVE LITHOTRIPSY (ESWL);  Surgeon: Irine Seal, MD;  [facility];  Service: Urology;  Laterality: Left;  . INGUINAL HERNIA REPAIR Bilateral 2004  . INGUINAL HERNIA REPAIR Bilateral 2004  . MOHS SURGERY Right    "temple"  . SQUAMOUS CELL CARCINOMA EXCISION     chest or back or left arm   Family History  Problem Relation Age of Onset  . Stroke Brother   . Cancer Brother        basosquamous cell carcinoma-head  . Heart failure Mother   . Colon cancer Cousin 69  . Heart disease Neg Hx   . Hyperlipidemia Neg Hx   . Hypertension Neg Hx   . Diabetes Neg Hx    Social History  Substance Use Topics  . Smoking status: Former Smoker    Packs/day: 3.00    Years: 17.00    Types: Cigarettes    Quit date: 03/30/1983  . Smokeless tobacco: Never Used  . Alcohol use No     Comment: 05/30/2015 "recovering alcoholic; dry date 9/51/8841"      Review of  Systems Per HPI    Objective:   Physical Exam  Constitutional: He is oriented to person, place, and time. He appears well-developed and well-nourished. No distress.  HENT:  Head: Normocephalic and atraumatic.  Neurological: He is alert and oriented to person, place, and time.  Skin: Skin is warm and dry. Lesion noted. He is not diaphoretic.     Psychiatric: He has a normal mood and affect. His behavior is normal. Judgment and thought content normal.  Vitals reviewed.        BP (!) 144/62 (BP Location: Left Arm, Patient Position: Sitting, Cuff Size: Normal)   Pulse 71   Temp 98.2 F (36.8 C) (Oral)   Resp 12   Ht 5\' 8"  (1.727 m)   Wt 128 lb (58.1 kg)   SpO2 98%   BMI 19.46 kg/m  Wt Readings from Last 3 Encounters:  09/22/16 128 lb (58.1 kg)  08/31/16 129 lb 3.2 oz (58.6 kg)  05/27/16 127 lb 6.4 oz (57.8 kg)    Assessment & Plan:  1.  Abrasion of arm, right, initial encounter -  Patient Instructions  Use ointment twice a day for 7-10 days Clean and dry with with regular soap, can use gentle pressure from wash cloth to take flaking off If not better, please let me know   - mupirocin ointment (BACTROBAN) 2 %; Apply 1 application topically 3 (three) times daily.  Dispense: 22 g; Refill: 1   Clarene Reamer, FNP-BC  [facility]  09/22/2016 11:03 AM

## 2016-09-22 NOTE — Patient Instructions (Signed)
Use ointment twice a day for 7-10 days Clean and dry with with regular soap, can use gentle pressure from wash cloth to take flaking off If not better, please let me know

## 2016-09-27 ENCOUNTER — Encounter: Payer: Medicare Other | Attending: Internal Medicine | Admitting: Dietician

## 2016-09-27 DIAGNOSIS — N200 Calculus of kidney: Secondary | ICD-10-CM | POA: Diagnosis not present

## 2016-09-27 DIAGNOSIS — K3184 Gastroparesis: Secondary | ICD-10-CM | POA: Diagnosis not present

## 2016-09-27 DIAGNOSIS — Z713 Dietary counseling and surveillance: Secondary | ICD-10-CM | POA: Insufficient documentation

## 2016-09-27 NOTE — Progress Notes (Signed)
Medical Nutrition Therapy:  Appt start time: 1400 end time:  1520.   Assessment:  Primary concerns today: Patient is here alone.  He would like to get information regarding a diet that would be healthy.  He struggles with mild gastroparesis, decreased esophageal motility, and wants his diet to be heart healthy.  He also has had kidney stones but the cause of the stone has not been identified.  He has been following the general kidney stone diet as well as the gastroparesis diet.  He states that he never has an appetite and always feels full.  He fears eating at time due to problems in the past.  He is getting bored with his meals and restrictions.  He considers that he is chronically sad as his personality and took antidepressants in the past but did not feel different off them.  History includes HTN, GERD, hypercholesterolemia, HTN, kidney stones, gastroparesis, hypothyroidism, and ETOH and has been in recovery for several years.  Weight hx:   120 lbs UBW until 2 years ago. 128 lbs today. 137 lbs highest weight 2 years ago.  He reports that he gained weight with aging and inactivity.  He then lost weight after the cardiac ablation  and symptoms of gastroparesis and diarrhea began.  Patient lives alone.  He is a retired Chief Technology Officer.  He retired in 2004 and began cooking healthy with increased vegetables and whole grains.  He goes to the [facility] 5 times per week, enjoys walking, and started yoga.  Preferred Learning Style:   No preference indicated   Learning Readiness:   Ready  Change in progress   MEDICATIONS: see list    DIETARY INTAKE:  Usual eating pattern includes 3 meals and 1-2 snacks per day.  Everyday foods include low fiber, increased sugar, desserts, cooked onions and garlic.  Tolerates green beans, acorn squash, peeled and seeded yellow squash.  Avoided foods include whole grains, high fiber foods except for small amounts and very well cooked, raw vegetables.  Raw onions and garlic.  Limits added salt. Tries to follow a Kidney stone prevention diet.  He tolerates fat generally. Tried a probiotic in the past. 24-hr recall:  B (7:30-8 AM): peanut butter and jelly sandwich, 2% milk OR special K or Cornflakes OR waffles with syrup or toast with jelly and scrambled eggs Snk ( AM): none or occasional yogurt  L ( PM): similar to breakfast OR McDonald's fish fillet sandwich, apple pie, sprite OR [employer]. Delux cheese burger without onions and lettuce Snk ( PM): yogurt or cookie D ( PM): chicken, fish, very well cooked beef, or ground beef, with potatoes (most often) or rice or pasta, 1 vegetable OR frozen dinner Snk (9 PM): cake, cookies, and or ice cream with herbal tea Beverages:(around 8 cups per day max),  water, coffee with milk and sugar, 2% milk, herbal tea with splenda, 1 can regular soda per day.  Usual physical activity:  [employer] 5 times per week, enjoys walking, and started yoga.  Estimated energy needs: Patient aims for 2000 calories per day 2000 calories 80 g protein  Progress Towards Goal(s):  In progress.   Nutritional Diagnosis:  NB-1.1 Food and nutrition-related knowledge deficit As related to GI concerns and kidney stones.  As evidenced by patient report.    Intervention:  Nutrition counseling/education related to his GI symptoms, kidney stones and his nutritional needs.  We discussed his needed restrictions and parts of his diet where he can begin trials of  food.  Suggested adding one new food every 2-3 days to determine response.  Discussed basic meal planning and recipe ideas for him to try.  Continue the elevation of your bed. Consider stopping eating for 3 hours prior to bed. Calorie [employer] app can be helpful to know the nutrition of foods eaten out. Consider trying a probiotic again. Consider checking your vitamin B-12 level on your next MD visit. Challenge your food rules.    Continue to chew your foods well, eat slowly, eat  in a relaxing environment. Consider trying:   Canned mandarin oranges  Peeled fresh apple   Peeled fresh nectarine  Peeled and seeded zucchini   Butternut squash (for instance in soup).  Roasted vegetables  Tofu  Small portion of raw carrot  Known to tolerate well so continue:  Cantaloupe, honeydew  Applesauce  Peaches  Canned pears    Green beans  Peeled and seeded yellow squash  Tomato sauce  Acorn squash  Sweet potatoes  White potatoes  Chicken and rice stew casserole  Lean ground meat  Other tender cuts of lean meat.  Fish     Avoid:  Banana  Teaching Method Utilized:  Visual Auditory Hands on  Handouts given during visit include:  Probiotic list  Types of fat  Barriers to learning/adherence to lifestyle change: fear of eating certain foods based on illness in the past  Demonstrated degree of understanding via:  Teach Back   Monitoring/Evaluation:  Dietary intake, exercise, and body weight prn.

## 2016-09-27 NOTE — Patient Instructions (Addendum)
Continue the elevation of your bed. Consider stopping eating for 3 hours prior to bed. Calorie [employer] app can be helpful to know the nutrition of foods eaten out. Consider trying a probiotic again. Consider checking your vitamin B-12 level on your next MD visit. Challenge your food rules.    Continue to chew your foods well, eat slowly, eat in a relaxing environment. Consider trying:   Canned mandarin oranges  Peeled fresh apple   Peeled fresh nectarine  Peeled and seeded zucchini   Butternut squash (for instance in soup).  Roasted vegetables  Tofu  Small portion of raw carrot    Known to tolerate well so continue:  Cantaloupe, honeydew  Applesauce  Peaches  Canned pears    Green beans  Peeled and seeded yellow squash  Tomato sauce  Acorn squash  Sweet potatoes  White potatoes  Chicken and rice stew casserole  Lean ground meat  Other tender cuts of lean meat.  Fish     Avoid:  Banana

## 2016-10-07 ENCOUNTER — Other Ambulatory Visit (INDEPENDENT_AMBULATORY_CARE_PROVIDER_SITE_OTHER): Payer: Medicare Other

## 2016-10-07 ENCOUNTER — Ambulatory Visit (INDEPENDENT_AMBULATORY_CARE_PROVIDER_SITE_OTHER): Payer: Medicare Other | Admitting: Internal Medicine

## 2016-10-07 ENCOUNTER — Encounter: Payer: Self-pay | Admitting: Internal Medicine

## 2016-10-07 VITALS — BP 118/68 | HR 68 | Temp 98.1°F | Resp 16 | Ht 68.0 in | Wt 128.0 lb

## 2016-10-07 DIAGNOSIS — C44602 Unspecified malignant neoplasm of skin of right upper limb, including shoulder: Secondary | ICD-10-CM | POA: Diagnosis not present

## 2016-10-07 DIAGNOSIS — F418 Other specified anxiety disorders: Secondary | ICD-10-CM

## 2016-10-07 DIAGNOSIS — E033 Postinfectious hypothyroidism: Secondary | ICD-10-CM

## 2016-10-07 DIAGNOSIS — C44601 Unspecified malignant neoplasm of skin of unspecified upper limb, including shoulder: Secondary | ICD-10-CM | POA: Insufficient documentation

## 2016-10-07 LAB — TSH: TSH: 4.24 u[IU]/mL (ref 0.35–4.50)

## 2016-10-07 MED ORDER — VORTIOXETINE HBR 5 MG PO TABS: 1.0000 | ORAL_TABLET | Freq: Every day | ORAL | 0 refills | Status: DC

## 2016-10-07 MED ORDER — VORTIOXETINE HBR 10 MG PO TABS: 1.0000 | ORAL_TABLET | Freq: Every day | ORAL | 0 refills | Status: DC

## 2016-10-07 NOTE — Progress Notes (Signed)
Subjective:  Patient ID: Roy Long, male    DOB: Apr 03, 1950  Age: 66 y.o. MRN: [id number]  CC: Hypothyroidism   HPI Roy Long presents for f/up - He complains of a lesion on his right forearm that hasn't healed over the last few months. He complains of worsening symptoms of depression with sadness, ruminations about death and passive suicidal thoughts, decreasing appetite, no weight changes, no insomnia anxiety or panic.  Outpatient Medications Prior to Visit  Medication Sig Dispense Refill  . acyclovir (ZOVIRAX) 400 MG tablet Take 1 tablet (400 mg total) by mouth 3 (three) times daily. (Patient taking differently: Take 400 mg by mouth daily. ) 90 tablet 3  . atorvastatin (LIPITOR) 80 MG tablet Take 1 tablet (80 mg total) by mouth daily. 90 tablet 3  . cholecalciferol (VITAMIN D) 1000 UNITS tablet Take 1,000 Units by mouth daily.    . cycloSPORINE (RESTASIS) 0.05 % ophthalmic emulsion Place 1 drop into both eyes 2 (two) times daily.    Marland Kitchen diltiazem (CARDIZEM CD) 120 MG 24 hr capsule TAKE 1 CAPSULE (120 MG TOTAL) BY MOUTH DAILY. 90 capsule 2  . ELIQUIS 5 MG TABS tablet TAKE 1 TABLET (5 MG TOTAL) BY MOUTH 2 (TWO) TIMES DAILY. 180 tablet 1  . finasteride (PROSCAR) 5 MG tablet Take 5 mg by mouth daily.    . fluticasone (FLONASE) 50 MCG/ACT nasal spray Place 1 spray into both nostrils daily as needed for allergies or rhinitis.    . hydroxypropyl methylcellulose / hypromellose (ISOPTO TEARS / GONIOVISC) 2.5 % ophthalmic solution Place 1 drop into both eyes daily as needed for dry eyes.    Marland Kitchen levothyroxine (SYNTHROID, LEVOTHROID) 25 MCG tablet Take 1 tablet (25 mcg total) by mouth daily before breakfast. 90 tablet 3  . loratadine (CLARITIN) 10 MG tablet Take 10 mg by mouth daily as needed for allergies.    . Melatonin CR 3 MG TBCR Take 6 mg by mouth at bedtime.    . Multiple Vitamins-Minerals (PRESERVISION AREDS PO) Take 1 mg by mouth 2 (two) times daily.     . mupirocin ointment  (BACTROBAN) 2 % Apply 1 application topically 3 (three) times daily. 22 g 1  . Omega 3 1000 MG CAPS Take 1 capsule by mouth daily.    Marland Kitchen omeprazole (PRILOSEC) 40 MG capsule Take 1 capsule (40 mg total) by mouth daily. 90 capsule 11  . tamsulosin (FLOMAX) 0.4 MG CAPS capsule TAKE 1 CAPSULE (0.4 MG TOTAL) BY MOUTH DAILY. 90 capsule 3  . Vitamins-Lipotropics (LIPOFLAVONOID PO) Take 1 tablet by mouth 3 (three) times daily.      No facility-administered medications prior to visit.     ROS Review of Systems  Constitutional: Positive for fatigue. Negative for activity change, appetite change, diaphoresis and unexpected weight change.  HENT: Negative.   Eyes: Negative for visual disturbance.  Respiratory: Negative for cough, chest tightness, shortness of breath and wheezing.   Cardiovascular: Negative for chest pain, palpitations and leg swelling.  Gastrointestinal: Negative for abdominal pain, blood in stool, constipation, diarrhea, nausea and vomiting.  Endocrine: Negative.  Negative for cold intolerance and heat intolerance.  Genitourinary: Negative.  Negative for difficulty urinating.  Musculoskeletal: Negative.   Skin: Positive for color change. Negative for wound.  Allergic/Immunologic: Negative.   Neurological: Negative.  Negative for dizziness, weakness and numbness.  Hematological: Negative for adenopathy. Does not bruise/bleed easily.  Psychiatric/Behavioral: Positive for dysphoric mood. Negative for confusion, decreased concentration and suicidal ideas. The patient  is not nervous/anxious.     Objective:  BP 118/68 (BP Location: Left Arm, Patient Position: Sitting, Cuff Size: Normal)   Pulse 68   Temp 98.1 F (36.7 C) (Oral)   Resp 16   Ht 5\' 8"  (1.727 m)   Wt 128 lb (58.1 kg)   SpO2 99%   BMI 19.46 kg/m   BP Readings from Last 3 Encounters:  10/07/16 118/68  09/22/16 (!) 144/62  08/31/16 110/62    Wt Readings from Last 3 Encounters:  10/07/16 128 lb (58.1 kg)    09/27/16 128 lb (58.1 kg)  09/22/16 128 lb (58.1 kg)    Physical Exam  Constitutional: He is oriented to person, place, and time. No distress.  HENT:  Mouth/Throat: Oropharynx is clear and moist. No oropharyngeal exudate.  Eyes: Conjunctivae are normal. Right eye exhibits no discharge. Left eye exhibits no discharge. No scleral icterus.  Neck: Normal range of motion. Neck supple. No JVD present. No thyromegaly present.  Cardiovascular: Normal rate, regular rhythm and intact distal pulses.  Exam reveals no gallop and no friction rub.   No murmur heard. Pulmonary/Chest: Effort normal and breath sounds normal. No respiratory distress. He has no wheezes. He has no rales. He exhibits no tenderness.  Abdominal: Soft. Bowel sounds are normal. He exhibits no distension and no mass. There is no tenderness. There is no rebound and no guarding.  Musculoskeletal: Normal range of motion. He exhibits no edema, tenderness or deformity.  Lymphadenopathy:    He has no cervical adenopathy.  Neurological: He is alert and oriented to person, place, and time.  Skin: Skin is warm and dry. No rash noted. He is not diaphoretic. No erythema. No pallor.  Dorsum of right mid forearm shows a 5-6 mm lesion with a base of erythema, some raised pearly areas, and a center area of hard crusting.  Psychiatric: His behavior is normal. Judgment normal. His mood appears not anxious. His affect is not angry. His speech is not rapid and/or pressured, not delayed and not tangential. He is not slowed and not withdrawn. Cognition and memory are normal. He exhibits a depressed mood. He expresses no homicidal and no suicidal ideation. He expresses no suicidal plans and no homicidal plans.  tearful He is attentive.  Vitals reviewed.   Lab Results  Component Value Date   WBC 6.6 03/18/2016   HGB 15.3 03/18/2016   HCT 44.3 03/18/2016   PLT 226.0 03/18/2016   GLUCOSE 97 03/18/2016   CHOL 97 03/18/2016   TRIG 68.0 03/18/2016    HDL 40.90 03/18/2016   LDLCALC 42 03/18/2016   ALT 23 03/18/2016   AST 19 03/18/2016   NA 142 03/18/2016   K 4.2 03/18/2016   CL 105 03/18/2016   CREATININE 0.83 03/18/2016   BUN 12 03/18/2016   CO2 32 03/18/2016   TSH 4.24 10/07/2016   PSA 0.15 03/18/2016   INR 1.0 04/15/2011   HGBA1C 5.1 04/15/2011    No results found.  Assessment & Plan:   Jimie was seen today for hypothyroidism.  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 -     Cancel: TSH; Future -     TSH; Future  Depression with anxiety- will start Trintellix at 5 mg a day for 3 weeks, then I have asked him to try to advance to 10 mg a day for 3 weeks,  then I would like to discuss with him increasing up to 20 mg a day. I think this is a good combination for his depression and will improve his cognitive functioning. -     vortioxetine HBr (TRINTELLIX) 5 MG TABS; Take 1 tablet (5 mg total) by mouth daily. -     vortioxetine HBr (TRINTELLIX) 10 MG TABS; Take 1 tablet (10 mg total) by mouth daily.  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 -     Ambulatory referral to Dermatology   I am having Mr. Kissner start on vortioxetine HBr and vortioxetine HBr. I am also having him maintain his Vitamins-Lipotropics (LIPOFLAVONOID PO), Multiple Vitamins-Minerals (PRESERVISION AREDS PO), cycloSPORINE, cholecalciferol, hydroxypropyl methylcellulose / hypromellose, finasteride, omeprazole, atorvastatin, tamsulosin, levothyroxine, diltiazem, acyclovir, ELIQUIS, loratadine, fluticasone, Melatonin CR, Omega 3, and mupirocin ointment.  Meds ordered this encounter  Medications  . vortioxetine HBr (TRINTELLIX) 5 MG TABS    Sig: Take 1 tablet (5 mg total) by mouth daily.    Dispense:  21  tablet    Refill:  0  . vortioxetine HBr (TRINTELLIX) 10 MG TABS    Sig: Take 1 tablet (10 mg total) by mouth daily.    Dispense:  21 tablet    Refill:  0     Follow-up: Return in about 6 weeks (around 11/18/2016).  [name], MD

## 2016-10-07 NOTE — Patient Instructions (Signed)
Major Depressive Disorder, Adult Major depressive disorder (MDD) is a mental health condition. It may also be called clinical depression or unipolar depression. MDD usually causes feelings of sadness, hopelessness, or helplessness. MDD can also cause physical symptoms. It can interfere with work, school, relationships, and other everyday activities. MDD may be mild, moderate, or severe. It may occur once (single episode major depressive disorder) or it may occur multiple times (recurrent major depressive disorder). What are the causes? The exact cause of this condition is not known. MDD is most likely caused by a combination of things, which may include:  Genetic factors. These are traits that are passed along from parent to child.  Individual factors. Your personality, your behavior, and the way you handle your thoughts and feelings may contribute to MDD. This includes personality traits and behaviors learned from others.  Physical factors, such as: ? Differences in the part of your brain that controls emotion. This part of your brain may be different than it is in people who do not have MDD. ? Long-term (chronic) medical or psychiatric illnesses.  Social factors. Traumatic experiences or major life changes may play a role in the development of MDD.  What increases the risk? This condition is more likely to develop in women. The following factors may also make you more likely to develop MDD:  A family history of depression.  Troubled family relationships.  Abnormally low levels of certain brain chemicals.  Traumatic events in childhood, especially abuse or the loss of a parent.  Being under a lot of stress, or long-term stress, especially from upsetting life experiences or losses.  A history of: ? Chronic physical illness. ? Other mental health disorders. ? Substance abuse.  Poor living conditions.  Experiencing social exclusion or discrimination on a regular basis.  What are  the signs or symptoms? The main symptoms of MDD typically include:  Constant depressed or irritable mood.  Loss of interest in things and activities.  MDD symptoms may also include:  Sleeping or eating too much or too little.  Unexplained weight change.  Fatigue or low energy.  Feelings of worthlessness or guilt.  Difficulty thinking clearly or making decisions.  Thoughts of suicide or of harming others.  Physical agitation or weakness.  Isolation.  Severe cases of MDD may also occur with other symptoms, such as:  Delusions or hallucinations, in which you imagine things that are not real (psychotic depression).  Low-level depression that lasts at least a year (chronic depression or persistent depressive disorder).  Extreme sadness and hopelessness (melancholic depression).  Trouble speaking and moving (catatonic depression).  How is this diagnosed? This condition may be diagnosed based on:  Your symptoms.  Your medical history, including your mental health history. This may involve tests to evaluate your mental health. You may be asked questions about your lifestyle, including any drug and alcohol use, and how long you have had symptoms of MDD.  A physical exam.  Blood tests to rule out other conditions.  You must have a depressed mood and at least four other MDD symptoms most of the day, nearly every day in the same 2-week timeframe before your health care provider can confirm a diagnosis of MDD. How is this treated? This condition is usually treated by mental health professionals, such as psychologists, psychiatrists, and clinical social workers. You may need more than one type of treatment. Treatment may include:  Psychotherapy. This is also called talk therapy or counseling. Types of psychotherapy include: ? Cognitive behavioral   therapy (CBT). This type of therapy teaches you to recognize unhealthy feelings, thoughts, and behaviors, and replace them with  positive thoughts and actions. ? Interpersonal therapy (IPT). This helps you to improve the way you relate to and communicate with others. ? Family therapy. This treatment includes members of your family.  Medicine to treat anxiety and depression, or to help you control certain emotions and behaviors.  Lifestyle changes, such as: ? Limiting alcohol and drug use. ? Exercising regularly. ? Getting plenty of sleep. ? Making healthy eating choices. ? Spending more time outdoors.  Treatments involving stimulation of the brain can be used in situations with extremely severe symptoms, or when medicine or other therapies do not work over time. These treatments include electroconvulsive therapy, transcranial magnetic stimulation, and vagal nerve stimulation. Follow these instructions at home: Activity  Return to your normal activities as told by your health care provider.  Exercise regularly and spend time outdoors as told by your health care provider. General instructions  Take over-the-counter and prescription medicines only as told by your health care provider.  Do not drink alcohol. If you drink alcohol, limit your alcohol intake to no more than 1 drink a day for nonpregnant women and 2 drinks a day for men. One drink equals 12 oz of beer, 5 oz of wine, or 1 oz of hard liquor. Alcohol can affect any antidepressant medicines you are taking. Talk to your health care provider about your alcohol use.  Eat a healthy diet and get plenty of sleep.  Find activities that you enjoy doing, and make time to do them.  Consider joining a support group. Your health care provider may be able to recommend a support group.  Keep all follow-up visits as told by your health care provider. This is important. Where to find more information: National Alliance on Mental Illness  www.nami.org  [place]. National Institute of Mental Health  www.nimh.nih.gov  National Suicide Prevention  Lifeline  1-800-273-TALK (8255). This is free, 24-hour help.  Contact a health care provider if:  Your symptoms get worse.  You develop new symptoms. Get help right away if:  You self-harm.  You have serious thoughts about hurting yourself or others.  You see, hear, taste, smell, or feel things that are not present (hallucinate). This information is not intended to replace advice given to you by your health care provider. Make sure you discuss any questions you have with your health care provider. Document Released: 07/10/2012 Document Revised: 11/20/2015 Document Reviewed: 09/24/2015 Elsevier Interactive Patient Education  2017 [employer]vier Inc.  

## 2016-10-08 ENCOUNTER — Encounter: Payer: Self-pay | Admitting: Internal Medicine

## 2016-10-27 ENCOUNTER — Other Ambulatory Visit: Payer: Self-pay | Admitting: Physician Assistant

## 2016-10-27 DIAGNOSIS — E785 Hyperlipidemia, unspecified: Secondary | ICD-10-CM

## 2016-11-04 ENCOUNTER — Other Ambulatory Visit: Payer: Self-pay | Admitting: Cardiology

## 2016-11-04 NOTE — Telephone Encounter (Signed)
Patient of Dr [name]. Last seen by Dr Martinique 12/05/2014

## 2016-11-17 ENCOUNTER — Encounter: Payer: Self-pay | Admitting: Internal Medicine

## 2016-11-17 ENCOUNTER — Ambulatory Visit (INDEPENDENT_AMBULATORY_CARE_PROVIDER_SITE_OTHER): Payer: Medicare Other | Admitting: Internal Medicine

## 2016-11-17 VITALS — BP 128/68 | HR 72 | Temp 98.1°F | Resp 16 | Ht 68.0 in | Wt 125.0 lb

## 2016-11-17 DIAGNOSIS — F418 Other specified anxiety disorders: Secondary | ICD-10-CM | POA: Diagnosis not present

## 2016-11-17 DIAGNOSIS — Z23 Encounter for immunization: Secondary | ICD-10-CM | POA: Diagnosis not present

## 2016-11-17 DIAGNOSIS — K2100 Gastro-esophageal reflux disease with esophagitis, without bleeding: Secondary | ICD-10-CM

## 2016-11-17 DIAGNOSIS — K229 Disease of esophagus, unspecified: Secondary | ICD-10-CM

## 2016-11-17 MED ORDER — VORTIOXETINE HBR 10 MG PO TABS: 1.0000 | ORAL_TABLET | Freq: Every day | ORAL | 1 refills | Status: DC

## 2016-11-17 MED ORDER — OMEPRAZOLE 40 MG PO CPDR: 40.0000 mg | DELAYED_RELEASE_CAPSULE | ORAL | 3 refills | Status: DC

## 2016-11-17 MED ORDER — OMEPRAZOLE 20 MG PO CPDR: 20.0000 mg | DELAYED_RELEASE_CAPSULE | Freq: Every day | ORAL | 3 refills | Status: DC

## 2016-11-17 NOTE — Patient Instructions (Signed)
Food Choices for Gastroesophageal Reflux Disease, Adult When you have gastroesophageal reflux disease (GERD), the foods you eat and your eating habits are very important. Choosing the right foods can help ease your discomfort. What guidelines do I need to follow?  Choose fruits, vegetables, whole grains, and low-fat dairy products.  Choose low-fat meat, fish, and poultry.  Limit fats such as oils, salad dressings, butter, nuts, and avocado.  Keep a food diary. This helps you identify foods that cause symptoms.  Avoid foods that cause symptoms. These may be different for everyone.  Eat small meals often instead of 3 large meals a day.  Eat your meals slowly, in a place where you are relaxed.  Limit fried foods.  Cook foods using methods other than frying.  Avoid drinking alcohol.  Avoid drinking large amounts of liquids with your meals.  Avoid bending over or lying down until 2-3 hours after eating. What foods are not recommended? These are some foods and drinks that may make your symptoms worse: Vegetables  Tomatoes. Tomato juice. Tomato and spaghetti sauce. Chili peppers. Onion and garlic. Horseradish. Fruits  Oranges, grapefruit, and lemon (fruit and juice). Meats  High-fat meats, fish, and poultry. This includes hot dogs, ribs, ham, sausage, salami, and bacon. Dairy  Whole milk and chocolate milk. Sour cream. Cream. Butter. Ice cream. Cream cheese. Drinks  Coffee and tea. Bubbly (carbonated) drinks or energy drinks. Condiments  Hot sauce. Barbecue sauce. Sweets/Desserts  Chocolate and cocoa. Donuts. Peppermint and spearmint. Fats and Oils  High-fat foods. This includes French fries and potato chips. Other  Vinegar. Strong spices. This includes black pepper, white pepper, red pepper, cayenne, curry powder, cloves, ginger, and chili powder. The items listed above may not be a complete list of foods and drinks to avoid. Contact your dietitian for more information.    This information is not intended to replace advice given to you by your health care provider. Make sure you discuss any questions you have with your health care provider. Document Released: 09/14/2011 Document Revised: 08/21/2015 Document Reviewed: 01/17/2013 Elsevier Interactive Patient Education  2017 Elsevier Inc.  

## 2016-11-17 NOTE — Progress Notes (Signed)
Subjective:  Patient ID: Roy Long, male    DOB: 1950/06/13  Age: 66 y.o. MRN: [id number]  CC: Depression   HPI Roy Long presents for f/up on depression. His mood is much better after a few weeks of trintellix and he wants to continue the 10 mg dose. Unfortunately, he feels like the new antidepressant has made his heartburn worse. In addition to taking 40 mg of Prilosec in the morning he wants to add a 20 mg dose in the evening.  Outpatient Medications Prior to Visit  Medication Sig Dispense Refill  . acyclovir (ZOVIRAX) 400 MG tablet Take 1 tablet (400 mg total) by mouth 3 (three) times daily. (Patient taking differently: Take 400 mg by mouth daily. ) 90 tablet 3  . atorvastatin (LIPITOR) 80 MG tablet TAKE 1 TABLET (80 MG TOTAL) BY MOUTH DAILY. 90 tablet 3  . cholecalciferol (VITAMIN D) 1000 UNITS tablet Take 1,000 Units by mouth daily.    . cycloSPORINE (RESTASIS) 0.05 % ophthalmic emulsion Place 1 drop into both eyes 2 (two) times daily.    Marland Kitchen diltiazem (CARDIZEM CD) 120 MG 24 hr capsule TAKE 1 CAPSULE (120 MG TOTAL) BY MOUTH DAILY. 90 capsule 2  . ELIQUIS 5 MG TABS tablet TAKE 1 TABLET (5 MG TOTAL) BY MOUTH 2 (TWO) TIMES DAILY. 180 tablet 1  . finasteride (PROSCAR) 5 MG tablet Take 5 mg by mouth daily.    . fluticasone (FLONASE) 50 MCG/ACT nasal spray Place 1 spray into both nostrils daily as needed for allergies or rhinitis.    . hydroxypropyl methylcellulose / hypromellose (ISOPTO TEARS / GONIOVISC) 2.5 % ophthalmic solution Place 1 drop into both eyes daily as needed for dry eyes.    Marland Kitchen levothyroxine (SYNTHROID, LEVOTHROID) 25 MCG tablet Take 1 tablet (25 mcg total) by mouth daily before breakfast. 90 tablet 3  . loratadine (CLARITIN) 10 MG tablet Take 10 mg by mouth daily as needed for allergies.    . Melatonin CR 3 MG TBCR Take 6 mg by mouth at bedtime.    . Multiple Vitamins-Minerals (PRESERVISION AREDS PO) Take 1 mg by mouth 2 (two) times daily.     . mupirocin ointment  (BACTROBAN) 2 % Apply 1 application topically 3 (three) times daily. 22 g 1  . Omega 3 1000 MG CAPS Take 1 capsule by mouth daily.    . tamsulosin (FLOMAX) 0.4 MG CAPS capsule TAKE 1 CAPSULE (0.4 MG TOTAL) BY MOUTH DAILY. 90 capsule 3  . Vitamins-Lipotropics (LIPOFLAVONOID PO) Take 1 tablet by mouth 3 (three) times daily.     Marland Kitchen omeprazole (PRILOSEC) 40 MG capsule Take 1 capsule (40 mg total) by mouth daily. 90 capsule 11  . vortioxetine HBr (TRINTELLIX) 10 MG TABS Take 1 tablet (10 mg total) by mouth daily. 21 tablet 0  . vortioxetine HBr (TRINTELLIX) 5 MG TABS Take 1 tablet (5 mg total) by mouth daily. 21 tablet 0   No facility-administered medications prior to visit.     ROS Review of Systems  Constitutional: Negative for activity change, appetite change, diaphoresis and fatigue.  HENT: Negative.  Negative for trouble swallowing and voice change.   Eyes: Negative.   Respiratory: Negative.  Negative for cough, chest tightness, shortness of breath and wheezing.   Cardiovascular: Negative for chest pain, palpitations and leg swelling.  Gastrointestinal: Negative for abdominal pain, constipation, diarrhea, nausea and vomiting.  Endocrine: Negative.   Genitourinary: Negative.  Negative for difficulty urinating.  Musculoskeletal: Negative.   Allergic/Immunologic: Negative.  Neurological: Negative.   Hematological: Negative.   Psychiatric/Behavioral: Negative for dysphoric mood, sleep disturbance and suicidal ideas. The patient is not nervous/anxious.     Objective:  BP 128/68 (BP Location: Left Arm, Patient Position: Sitting, Cuff Size: Normal)   Pulse 72   Temp 98.1 F (36.7 C) (Oral)   Ht 5\' 8"  (1.727 m)   Wt 125 lb (56.7 kg)   SpO2 98%   BMI 19.01 kg/m   BP Readings from Last 3 Encounters:  11/17/16 128/68  10/07/16 118/68  09/22/16 (!) 144/62    Wt Readings from Last 3 Encounters:  11/17/16 125 lb (56.7 kg)  10/07/16 128 lb (58.1 kg)  09/27/16 128 lb (58.1 kg)     Physical Exam  Constitutional: He is oriented to person, place, and time. No distress.  HENT:  Mouth/Throat: No oropharyngeal exudate.  Eyes: Right eye exhibits no discharge. Left eye exhibits no discharge. No scleral icterus.  Neck: Normal range of motion. Neck supple. No JVD present. No thyromegaly present.  Cardiovascular: Normal rate, regular rhythm and intact distal pulses.  Exam reveals no gallop and no friction rub.   No murmur heard. Pulmonary/Chest: Effort normal and breath sounds normal. No respiratory distress. He has no wheezes. He has no rales. He exhibits no tenderness.  Abdominal: Soft. Bowel sounds are normal. He exhibits no distension and no mass. There is no tenderness. There is no rebound and no guarding.  Musculoskeletal: Normal range of motion.  Lymphadenopathy:    He has no cervical adenopathy.  Neurological: He is alert and oriented to person, place, and time.  Skin: He is not diaphoretic.  Psychiatric: He has a normal mood and affect. Judgment normal. His mood appears not anxious. His affect is not inappropriate. His speech is not rapid and/or pressured, not delayed and not tangential. He is not agitated, not hyperactive, not slowed and not withdrawn. Cognition and memory are normal. He does not exhibit a depressed mood. He expresses no homicidal and no suicidal ideation. He expresses no suicidal plans and no homicidal plans. He is attentive.  Vitals reviewed.   Lab Results  Component Value Date   WBC 6.6 03/18/2016   HGB 15.3 03/18/2016   HCT 44.3 03/18/2016   PLT 226.0 03/18/2016   GLUCOSE 97 03/18/2016   CHOL 97 03/18/2016   TRIG 68.0 03/18/2016   HDL 40.90 03/18/2016   LDLCALC 42 03/18/2016   ALT 23 03/18/2016   AST 19 03/18/2016   NA 142 03/18/2016   K 4.2 03/18/2016   CL 105 03/18/2016   CREATININE 0.83 03/18/2016   BUN 12 03/18/2016   CO2 32 03/18/2016   TSH 4.24 10/07/2016   PSA 0.15 03/18/2016   INR 1.0 04/15/2011   HGBA1C 5.1 04/15/2011     No results found.  Assessment & Plan:   Roy Long was seen today for depression.  Diagnoses and all orders for this visit:  Depression with anxiety -     vortioxetine HBr (TRINTELLIX) 10 MG TABS; Take 1 tablet (10 mg total) by mouth daily.  GERD with esophagitis  Irregular Z line of esophagus -     omeprazole (PRILOSEC) 40 MG capsule; Take 1 capsule (40 mg total) by mouth every morning. -     omeprazole (PRILOSEC) 20 MG capsule; Take 1 capsule (20 mg total) by mouth daily.  Gastroesophageal reflux disease with esophagitis -     omeprazole (PRILOSEC) 40 MG capsule; Take 1 capsule (40 mg total) by mouth every morning. -  omeprazole (PRILOSEC) 20 MG capsule; Take 1 capsule (20 mg total) by mouth daily.  Need for influenza vaccination -     Flu vaccine HIGH DOSE PF (Fluzone High dose)   I have changed Mr. Cech omeprazole. I am also having him start on omeprazole. Additionally, I am having him maintain his Vitamins-Lipotropics (LIPOFLAVONOID PO), Multiple Vitamins-Minerals (PRESERVISION AREDS PO), cycloSPORINE, cholecalciferol, hydroxypropyl methylcellulose / hypromellose, finasteride, tamsulosin, levothyroxine, diltiazem, acyclovir, loratadine, fluticasone, Melatonin CR, Omega 3, mupirocin ointment, atorvastatin, ELIQUIS, and vortioxetine HBr.  Meds ordered this encounter  Medications  . omeprazole (PRILOSEC) 40 MG capsule    Sig: Take 1 capsule (40 mg total) by mouth every morning.    Dispense:  90 capsule    Refill:  3  . omeprazole (PRILOSEC) 20 MG capsule    Sig: Take 1 capsule (20 mg total) by mouth daily.    Dispense:  90 capsule    Refill:  3  . vortioxetine HBr (TRINTELLIX) 10 MG TABS    Sig: Take 1 tablet (10 mg total) by mouth daily.    Dispense:  90 tablet    Refill:  1     Follow-up: Return in about 4 months (around 03/19/2017).  [name], MD

## 2016-11-18 ENCOUNTER — Encounter: Payer: Self-pay | Admitting: Internal Medicine

## 2016-12-10 ENCOUNTER — Other Ambulatory Visit: Payer: Self-pay | Admitting: Gastroenterology

## 2016-12-10 DIAGNOSIS — K229 Disease of esophagus, unspecified: Secondary | ICD-10-CM

## 2016-12-10 DIAGNOSIS — K2100 Gastro-esophageal reflux disease with esophagitis, without bleeding: Secondary | ICD-10-CM

## 2017-02-07 ENCOUNTER — Other Ambulatory Visit: Payer: Self-pay | Admitting: Cardiology

## 2017-02-07 ENCOUNTER — Other Ambulatory Visit: Payer: Self-pay | Admitting: Internal Medicine

## 2017-02-07 DIAGNOSIS — N400 Enlarged prostate without lower urinary tract symptoms: Secondary | ICD-10-CM

## 2017-02-21 ENCOUNTER — Telehealth: Payer: Self-pay | Admitting: Cardiology

## 2017-02-21 DIAGNOSIS — I493 Ventricular premature depolarization: Secondary | ICD-10-CM

## 2017-02-21 DIAGNOSIS — R002 Palpitations: Secondary | ICD-10-CM

## 2017-02-21 NOTE — Telephone Encounter (Signed)
New Message  Pt c/o BP issue: STAT if pt c/o blurred vision, one-sided weakness or slurred speech  1. What are your last 5 BP readings? Old machine yesterday 137/82 today on old machine 172/94, 160/97... New Machine 161/80   2. Are you having any other symptoms (ex. Dizziness, headache, blurred vision, passed out)? Irregular heartbeat   3. What is your BP issue? Per pt would like to discuss his high bp .please call back.

## 2017-02-21 NOTE — Telephone Encounter (Signed)
Continuing to have PACs Since last visit he has been having different kinds of heartbeats.  "The last few days I have been having these different beats and they seem opposite of each other". States he hasn't been feeling bad.  Denies any pain, numbness, SOB, dizziness, weakness. The abnormal rhythm seems to be "sticking around".  "Generally when I notice that when I get up and exert myself it seems to go back into normal rhythm -- but this isn't stopping this time." Reports that sometime earlier today he was having a HA, which went away.  And this evening it seemed like it was going to come back but it didn't. Reports BPs this afternoon: 160/97 172/94 161/80 186/84  He understands I will have Dr [name] review and call him with recommendation/s.

## 2017-02-22 MED ORDER — AMLODIPINE BESYLATE 5 MG PO TABS: 5.0000 mg | ORAL_TABLET | Freq: Every day | ORAL | 3 refills | Status: DC

## 2017-02-22 MED ORDER — DILTIAZEM HCL ER COATED BEADS 240 MG PO CP24: 240.0000 mg | ORAL_CAPSULE | Freq: Every day | ORAL | 3 refills | Status: DC

## 2017-02-22 NOTE — Telephone Encounter (Signed)
Advised pt Dr. [name] recommends 30 day monitor & increase Diltiazem to 240 mg daily and add Norvasc 5 mg daily. He is going to start w/ increased Diltiazem for several days before determining if he wants/needs to start Norvasc.  Pt would like to make sure monitor won't be too expensive w/ his insurance and he is aware I will discuss this w/ billing tomorrow and let him know (won't order monitor until then). He also understands I will let him know if we need to "push out" 12/10 OV scheduled w/ Camnitz. Patient verbalized understanding and agreeable to above stated plan.

## 2017-02-23 NOTE — Telephone Encounter (Signed)
Informed pt that event monitor did not need pre-cert w/ insurance prior to ordering.   Explained that I have placed monitor order w/ benefits quote request.  He understands [employer], the monitor company, will contact him to inform him of his out-of-pocket cost. Pt will let me know once he has heard from monitor company and we will go from there on scheduling him to have monitor placed.

## 2017-02-25 ENCOUNTER — Encounter: Payer: Self-pay | Admitting: Cardiology

## 2017-03-07 ENCOUNTER — Ambulatory Visit: Payer: Medicare Other | Admitting: Cardiology

## 2017-03-15 NOTE — Telephone Encounter (Signed)
Followed up with pt who reports he has not heard anything from [employer] on a quote for monitor.  Pt tells me that he would like to hold off for now, until he follows up w/ Dr. [name] in several weeks -- he reports he has not been feeling abnormal rhythm like he was at the end of November. Appt made for 04/05/17 to review further w/ physician. Pt also tells me that he hasn't started the recommended Amlodipine but that he was going to have to d/t BP still elevated.  He will call the office if SE begin and/or BP not improved w/ the start of Amlodipine. Patient verbalized understanding and agreeable to plan.

## 2017-03-21 ENCOUNTER — Telehealth: Payer: Self-pay | Admitting: Family Medicine

## 2017-03-21 ENCOUNTER — Ambulatory Visit: Payer: Medicare Other | Admitting: Family Medicine

## 2017-03-21 ENCOUNTER — Encounter: Payer: Self-pay | Admitting: Family Medicine

## 2017-03-21 VITALS — BP 118/68 | HR 76 | Temp 98.1°F | Wt 130.6 lb

## 2017-03-21 DIAGNOSIS — H1033 Unspecified acute conjunctivitis, bilateral: Secondary | ICD-10-CM

## 2017-03-21 MED ORDER — NEOMYCIN-POLYMYXIN-HC 3.5-10000-1 OP SUSP: 4.0000 [drp] | Freq: Four times a day (QID) | OPHTHALMIC | 0 refills | Status: DC

## 2017-03-21 MED FILL — NEOMYCIN/POLY/HC EYE DROPS: 3.5-10000-1 | 5 days supply | Qty: 8 | Fill #0

## 2017-03-21 NOTE — Progress Notes (Signed)
   Subjective:    Patient ID: Roy Long, male    DOB: [birth year], 66 y.o.   MRN: [id number]  HPI Here for 3 days of redness, itching, and yellow mucus in both eyes. No other URI symptoms. He does not wear contact lenses.   Review of Systems  Constitutional: Negative.   HENT: Negative.   Eyes: Positive for discharge, redness and itching. Negative for pain.  Respiratory: Negative.        Objective:   Physical Exam  Constitutional: He appears well-developed and well-nourished.  HENT:  Right Ear: External ear normal.  Left Ear: External ear normal.  Nose: Nose normal.  Mouth/Throat: Oropharynx is clear and moist.  Eyes: EOM are normal. Pupils are equal, round, and reactive to light.  Both conjunctivae are pink, no DC present   Neck: Neck supple. No thyromegaly present.  Pulmonary/Chest: Effort normal and breath sounds normal. No respiratory distress. He has no wheezes. He has no rales.  Lymphadenopathy:    He has no cervical adenopathy.          Assessment & Plan:  Conjunctivitis, treat with Cortisporin drops and warm compresses. Alysia Penna, MD

## 2017-03-21 NOTE — Telephone Encounter (Signed)
Pt   Called  Stating  They  Did  Not have   The   Cortisporin  At    [facility]      Spoke  To Audrea Muscat at  [facility]  They  Have  The  rx    Pt notified

## 2017-03-24 ENCOUNTER — Telehealth: Payer: Self-pay

## 2017-03-24 NOTE — Telephone Encounter (Signed)
Fax from pharmacy neomycin-poly-HC-eye drops are on back order/ unavailable. Will need to call pt to advise that PCP is out of the office and won't be back until Monday.

## 2017-03-25 NOTE — Telephone Encounter (Signed)
Called pharmacy and they stated that an Rx was called in yesterday for tobramycin with dexamethasone by another provider. Called pt and left a VM asking them to call back if they need something to be sent in or not.

## 2017-03-25 NOTE — Telephone Encounter (Signed)
Try polytrim eye drops 1 drop  Qid  To affected eye  1 bottle disp

## 2017-03-25 NOTE — Telephone Encounter (Signed)
Sent to Dr. Regis Bill for alternative approval due to PCP being out of the office.

## 2017-04-05 ENCOUNTER — Ambulatory Visit: Payer: Medicare Other | Admitting: Cardiology

## 2017-04-05 ENCOUNTER — Encounter: Payer: Self-pay | Admitting: Cardiology

## 2017-04-05 VITALS — BP 118/62 | HR 69 | Ht 68.0 in | Wt 127.0 lb

## 2017-04-05 DIAGNOSIS — I10 Essential (primary) hypertension: Secondary | ICD-10-CM

## 2017-04-05 DIAGNOSIS — I480 Paroxysmal atrial fibrillation: Secondary | ICD-10-CM | POA: Diagnosis not present

## 2017-04-05 NOTE — Progress Notes (Signed)
Electrophysiology Office Note   Date:  04/05/2017   ID:  Roy, Long 06/12/1950, MRN [id number]  PCP:  Janith Lima, MD  Cardiologist:  Peter Martinique Primary Electrophysiologist:  Constance Haw, MD    Chief Complaint  Patient presents with  . Follow-up    PAF     History of Present Illness: Roy Long is a 67 y.o. male who presents today for electrophysiology evaluation.   He has a history of atrial fibrillation on flecainide, hypertension, hyperlipidemia, depression. He was admitted to [facility] on 12/ 8/ 16 with palpitations noting atrial flutter with 2 to one conduction. He has atrial fibrillation because back to the early 2006 and has also had atrial flutter during that time. He underwent cardioversion in 2003 and has had one to 2 episodes of atrial fibrillation every 6 months control pill the pocket strategy with flecainide. He has had multiple episodes over the last 3-4 months occurring 4-5 times.  He had ablation for AF on 05/30/15.  Today, denies symptoms of palpitations, chest pain, shortness of breath, orthopnea, PND, lower extremity edema, claudication, dizziness, presyncope, syncope, bleeding, or neurologic sequela. The patient is tolerating medications without difficulties.  Continue to have palpitations, but they are much improved.  His palpitations occur mainly every 3-4 weeks and last 10-15 seconds.  He is pleased with the results he has had since the increased dose of diltiazem.  He otherwise has no major complaint.    Past Medical History:  Diagnosis Date  . Age-related macular degeneration, dry, left eye   . Alcohol dependence ([place])   . Arthritis    "maybe a little bit in my left knee and right forefinger" (05/30/2015)  . Atrial fibrillation ([place])    ablation 05-2015 with no reoccurance as of 09-2015  . Basal cell carcinoma   . Depression    hx  . Family history of adverse reaction to anesthesia    "father had head injuries and dementia;  everytime he was put under less of his mentation came back"  . Gastroparesis   . GERD (gastroesophageal reflux disease)   . History of kidney stones   . Hypercholesterolemia dx'd 02/2015  . Hypertension   . Hypothyroidism   . Kidney stones   . Migraine aura without headache    "very rare now" (05/30/2015)  . Pneumonia ~ 2004  . Squamous carcinoma    hand, chest   Past Surgical History:  Procedure Laterality Date  . ANTERIOR CERVICAL DECOMP/DISCECTOMY FUSION  04/2009  . ATRIAL FIBRILLATION ABLATION  05/30/2015  . BACK SURGERY    . BASAL CELL CARCINOMA EXCISION     chest or back or left arm  . CARDIOVERSION  06/2002  . CATARACT EXTRACTION W/ INTRAOCULAR LENS IMPLANT Left ~ 2012  . COLONOSCOPY    . COLONOSCOPY W/ POLYPECTOMY  02/2003  . CYSTOSCOPY W/ STONE MANIPULATION  1980's   "basketted it out; no stent"  . ELECTROPHYSIOLOGIC STUDY N/A 05/30/2015   Procedure: Atrial Fibrillation Ablation;  Surgeon: Will Meredith Leeds, MD;  Location: [facility];  Service: Cardiovascular;  Laterality: N/A;  . EXTRACORPOREAL SHOCK WAVE LITHOTRIPSY Left 04/12/2016   Procedure: LEFT EXTRACORPOREAL SHOCK WAVE LITHOTRIPSY (ESWL);  Surgeon: Irine Seal, MD;  Location: [facility];  Service: Urology;  Laterality: Left;  . INGUINAL HERNIA REPAIR Bilateral 2004  . INGUINAL HERNIA REPAIR Bilateral 2004  . MOHS SURGERY Right    "temple"  . SQUAMOUS CELL CARCINOMA EXCISION  chest or back or left arm     Current Outpatient Medications  Medication Sig Dispense Refill  . acyclovir (ZOVIRAX) 400 MG tablet Take 1 tablet (400 mg total) by mouth 3 (three) times daily. (Patient taking differently: Take 400 mg by mouth daily. ) 90 tablet 3  . amLODipine (NORVASC) 5 MG tablet Take 1 tablet (5 mg total) by mouth daily. 30 tablet 3  . atorvastatin (LIPITOR) 80 MG tablet TAKE 1 TABLET (80 MG TOTAL) BY MOUTH DAILY. 90 tablet 3  . cholecalciferol (VITAMIN D) 1000 UNITS tablet Take 1,000 Units by mouth daily.    .  cycloSPORINE (RESTASIS) 0.05 % ophthalmic emulsion Place 1 drop into both eyes 2 (two) times daily.    Marland Kitchen diltiazem (CARDIZEM CD) 240 MG 24 hr capsule Take 1 capsule (240 mg total) by mouth daily. 30 capsule 3  . ELIQUIS 5 MG TABS tablet TAKE 1 TABLET (5 MG TOTAL) BY MOUTH 2 (TWO) TIMES DAILY. 180 tablet 1  . finasteride (PROSCAR) 5 MG tablet TAKE 1 TABLET BY MOUTH DAILY 90 tablet 3  . fluticasone (FLONASE) 50 MCG/ACT nasal spray Place 1 spray into both nostrils daily as needed for allergies or rhinitis.    . hydroxypropyl methylcellulose / hypromellose (ISOPTO TEARS / GONIOVISC) 2.5 % ophthalmic solution Place 1 drop into both eyes daily as needed for dry eyes.    Marland Kitchen levothyroxine (SYNTHROID, LEVOTHROID) 25 MCG tablet Take 1 tablet (25 mcg total) by mouth daily before breakfast. 90 tablet 3  . loratadine (CLARITIN) 10 MG tablet Take 10 mg by mouth daily as needed for allergies.    . Multiple Vitamins-Minerals (PRESERVISION AREDS PO) Take 1 mg by mouth 2 (two) times daily.     Marland Kitchen omeprazole (PRILOSEC) 20 MG capsule Take 1 capsule (20 mg total) by mouth daily. 90 capsule 3  . omeprazole (PRILOSEC) 40 MG capsule Take 1 capsule (40 mg total) by mouth every morning. 90 capsule 3  . tamsulosin (FLOMAX) 0.4 MG CAPS capsule TAKE 1 CAPSULE (0.4 MG TOTAL) BY MOUTH DAILY. 90 capsule 3  . Vitamins-Lipotropics (LIPOFLAVONOID PO) Take 1 tablet by mouth 3 (three) times daily.     Marland Kitchen vortioxetine HBr (TRINTELLIX) 10 MG TABS Take 1 tablet (10 mg total) by mouth daily. 90 tablet 1   No current facility-administered medications for this visit.     Allergies:   Pneumococcal vaccines and Tape   Social History:  The patient  reports that he quit smoking about 34 years ago. His smoking use included cigarettes. He has a 51.00 pack-year smoking history. he has never used smokeless tobacco. He reports that he does not drink alcohol or use drugs.   Family History:  The patient's family history includes Cancer in his  brother; Colon cancer (age of onset: 23) in his cousin; Heart failure in his mother; Stroke in his brother.  ROS:  Please see the history of present illness.   Otherwise, review of systems is positive for palpitations, visual changes.   All other systems are reviewed and negative.   PHYSICAL EXAM: VS:  BP 118/62   Pulse 69   Ht 5\' 8"  (1.727 m)   Wt 127 lb (57.6 kg)   SpO2 99%   BMI 19.31 kg/m  , BMI Body mass index is 19.31 kg/m. GEN: Well nourished, well developed, in no acute distress  HEENT: normal  Neck: no JVD, carotid bruits, or masses Cardiac: RRR; no murmurs, rubs, or gallops,no edema  Respiratory:  clear to auscultation  bilaterally, normal work of breathing GI: soft, nontender, nondistended, + BS MS: no deformity or atrophy  Skin: warm and dry Neuro:  Strength and sensation are intact Psych: euthymic mood, full affect  EKG:  EKG is ordered today. Personal review of the ekg ordered shows SR, septal infarct, rate 69   Recent Labs: 10/07/2016: TSH 4.24    Lipid Panel     Component Value Date/Time   CHOL 97 03/18/2016 1100   TRIG 68.0 03/18/2016 1100   HDL 40.90 03/18/2016 1100   CHOLHDL 2 03/18/2016 1100   VLDL 13.6 03/18/2016 1100   LDLCALC 42 03/18/2016 1100     Wt Readings from Last 3 Encounters:  04/05/17 127 lb (57.6 kg)  03/21/17 130 lb 9.6 oz (59.2 kg)  11/17/16 125 lb (56.7 kg)      Other studies Reviewed: Additional studies/ records that were reviewed today include: TTE and MPI 12/12/14 Review of the above records today demonstrates:  Normal perfusion. LVEF 64% with normal wall motion. Excellent exercise tolerance. This is a low risk study.  - Left ventricle: The cavity size was normal. Systolic function was normal. The estimated ejection fraction was in the range of 60% to 65%. Wall motion was normal; there were no regional wall motion abnormalities. Doppler parameters are consistent with abnormal left ventricular relaxation (grade 1  diastolic dysfunction). - Mitral valve: Mildly thickened leaflets .   ASSESSMENT AND PLAN:  1. Persistent atrial fibrillation/atrial flutter: In sinus rhythm today.  He is having short bursts of palpitations every few weeks that lasted 10-15 seconds.  His diltiazem dose was increased which greatly improved his symptoms.  No further changes.  This patients CHA2DS2-VASc Score and unadjusted Ischemic Stroke Rate (% per year) is equal to 2.2 % stroke rate/year from a score of 2  Above score calculated as 1 point each if present [CHF, HTN, DM, Vascular=MI/PAD/Aortic Plaque, Age if 65-74, or Male] Above score calculated as 2 points each if present [Age > 75, or Stroke/TIA/TE]  2.  Hypertension:Controlled today.  No changes.  Current medicines are reviewed at length with the patient today.   The patient has concerns regarding his medicines.  The following changes were made today: None  Labs/ tests ordered today include:  Orders Placed This Encounter  Procedures  . EKG 12-Lead     Disposition:   FU with Will Camnitz 6 months.  Signed, Will Meredith Leeds, MD  04/05/2017 12:28 PM     [facility] [address] [place]  [address] [phone] (office) [phone] (fax)

## 2017-04-05 NOTE — Patient Instructions (Signed)
Medication Instructions:  Your physician recommends that you continue on your current medications as directed. Please refer to the Current Medication list given to you today.  If you need a refill on your cardiac medications before your next appointment, please call your pharmacy.   Labwork: None ordered  Testing/Procedures: None ordered  Follow-Up: Your physician wants you to follow-up in: 6 months with Dr. Camnitz.  You will receive a reminder letter in the mail two months in advance. If you don't receive a letter, please call our office to schedule the follow-up appointment.  Thank you for choosing [facility]!!   Marlie Kuennen, RN [phone]         

## 2017-04-20 ENCOUNTER — Encounter: Payer: Self-pay | Admitting: Internal Medicine

## 2017-04-20 ENCOUNTER — Ambulatory Visit (INDEPENDENT_AMBULATORY_CARE_PROVIDER_SITE_OTHER): Payer: Medicare Other | Admitting: Internal Medicine

## 2017-04-20 ENCOUNTER — Other Ambulatory Visit (INDEPENDENT_AMBULATORY_CARE_PROVIDER_SITE_OTHER): Payer: Medicare Other

## 2017-04-20 ENCOUNTER — Other Ambulatory Visit: Payer: Self-pay

## 2017-04-20 VITALS — BP 120/60 | HR 72 | Temp 97.6°F | Resp 16 | Ht 68.0 in | Wt 127.0 lb

## 2017-04-20 DIAGNOSIS — E785 Hyperlipidemia, unspecified: Secondary | ICD-10-CM

## 2017-04-20 DIAGNOSIS — E039 Hypothyroidism, unspecified: Secondary | ICD-10-CM

## 2017-04-20 DIAGNOSIS — N400 Enlarged prostate without lower urinary tract symptoms: Secondary | ICD-10-CM

## 2017-04-20 DIAGNOSIS — F418 Other specified anxiety disorders: Secondary | ICD-10-CM | POA: Diagnosis not present

## 2017-04-20 DIAGNOSIS — Z0000 Encounter for general adult medical examination without abnormal findings: Secondary | ICD-10-CM

## 2017-04-20 DIAGNOSIS — I2510 Atherosclerotic heart disease of native coronary artery without angina pectoris: Secondary | ICD-10-CM | POA: Diagnosis not present

## 2017-04-20 LAB — COMPREHENSIVE METABOLIC PANEL
ALT: 19 U/L (ref 0–53)
AST: 17 U/L (ref 0–37)
Albumin: 4.3 g/dL (ref 3.5–5.2)
Alkaline Phosphatase: 84 U/L (ref 39–117)
BILIRUBIN TOTAL: 0.7 mg/dL (ref 0.2–1.2)
BUN: 10 mg/dL (ref 6–23)
CALCIUM: 9.2 mg/dL (ref 8.4–10.5)
CO2: 26 meq/L (ref 19–32)
CREATININE: 0.86 mg/dL (ref 0.40–1.50)
Chloride: 107 mEq/L (ref 96–112)
GFR: 93.8 mL/min (ref >60.00)
Glucose, Bld: 101 mg/dL — ABNORMAL HIGH (ref 70–99)
Potassium: 4 mEq/L (ref 3.5–5.1)
SODIUM: 144 meq/L (ref 135–145)
Total Protein: 6.8 g/dL (ref 6.0–8.3)

## 2017-04-20 LAB — LIPID PANEL
CHOL/HDL RATIO: 2
CHOLESTEROL: 105 mg/dL (ref 0–200)
HDL: 43.9 mg/dL (ref >39.00)
LDL Cholesterol: 39 mg/dL (ref 0–99)
NonHDL: 60.62
TRIGLYCERIDES: 108 mg/dL (ref 0.0–149.0)
VLDL: 21.6 mg/dL (ref 0.0–40.0)

## 2017-04-20 LAB — CBC WITH DIFFERENTIAL/PLATELET
BASOS ABS: 0 10*3/uL (ref 0.0–0.1)
Basophils Relative: 0.4 % (ref 0.0–3.0)
EOS ABS: 0.1 10*3/uL (ref 0.0–0.7)
Eosinophils Relative: 1.4 % (ref 0.0–5.0)
HEMATOCRIT: 43.7 % (ref 39.0–52.0)
Hemoglobin: 15.1 g/dL (ref 13.0–17.0)
LYMPHS ABS: 2.5 10*3/uL (ref 0.7–4.0)
Lymphocytes Relative: 33.7 % (ref 12.0–46.0)
MCHC: 34.5 g/dL (ref 30.0–36.0)
MCV: 91.3 fl (ref 78.0–100.0)
MONO ABS: 0.6 10*3/uL (ref 0.1–1.0)
Monocytes Relative: 7.6 % (ref 3.0–12.0)
NEUTROS ABS: 4.3 10*3/uL (ref 1.4–7.7)
NEUTROS PCT: 56.9 % (ref 43.0–77.0)
PLATELETS: 241 10*3/uL (ref 150.0–400.0)
RBC: 4.79 Mil/uL (ref 4.22–5.81)
RDW: 12.7 % (ref 11.5–15.5)
WBC: 7.5 10*3/uL (ref 4.0–10.5)

## 2017-04-20 LAB — PSA: PSA: 0.16 ng/mL (ref 0.10–4.00)

## 2017-04-20 LAB — TSH: TSH: 3.31 u[IU]/mL (ref 0.35–4.50)

## 2017-04-20 MED ORDER — DILTIAZEM HCL ER COATED BEADS 240 MG PO CP24: 240.0000 mg | ORAL_CAPSULE | Freq: Every day | ORAL | 3 refills | Status: DC

## 2017-04-20 MED ORDER — AMLODIPINE BESYLATE 5 MG PO TABS: 5.0000 mg | ORAL_TABLET | Freq: Every day | ORAL | 3 refills | Status: DC

## 2017-04-20 NOTE — Patient Instructions (Signed)

## 2017-04-20 NOTE — Progress Notes (Signed)
Subjective:  Patient ID: Roy Long, male    DOB: 11/18/1950  Age: 67 y.o. MRN: [id number]  CC: Hypothyroidism; Hyperlipidemia; and Annual Exam   HPI Roy Long presents for a CPX.  He feels well today and offers no complaints.  His depressive symptoms have resolved on 10 mg of Trintellix.  He feels like his thyroid dose is adequate as he has had no recent episodes of weight changes, fatigue, constipation, palpitations, or edema.  He is tolerating the cholesterol medicine well with no muscle or joint aches.  Past Medical History:  Diagnosis Date  . Age-related macular degeneration, dry, left eye   . Alcohol dependence ([place])   . Arthritis    "maybe a little bit in my left knee and right forefinger" (05/30/2015)  . Atrial fibrillation ([place])    ablation 05-2015 with no reoccurance as of 09-2015  . Basal cell carcinoma   . Depression    hx  . Family history of adverse reaction to anesthesia    "father had head injuries and dementia; everytime he was put under less of his mentation came back"  . Gastroparesis   . GERD (gastroesophageal reflux disease)   . History of kidney stones   . Hypercholesterolemia dx'd 02/2015  . Hypertension   . Hypothyroidism   . Kidney stones   . Migraine aura without headache    "very rare now" (05/30/2015)  . Pneumonia ~ 2004  . Squamous carcinoma    hand, chest   Past Surgical History:  Procedure Laterality Date  . ANTERIOR CERVICAL DECOMP/DISCECTOMY FUSION  04/2009  . ATRIAL FIBRILLATION ABLATION  05/30/2015  . BACK SURGERY    . BASAL CELL CARCINOMA EXCISION     chest or back or left arm  . CARDIOVERSION  06/2002  . CATARACT EXTRACTION W/ INTRAOCULAR LENS IMPLANT Left ~ 2012  . COLONOSCOPY    . COLONOSCOPY W/ POLYPECTOMY  02/2003  . CYSTOSCOPY W/ STONE MANIPULATION  1980's   "basketted it out; no stent"  . ELECTROPHYSIOLOGIC STUDY N/A 05/30/2015   Procedure: Atrial Fibrillation Ablation;  Surgeon: Will Meredith Leeds, MD;  Location: [facility];  Service: Cardiovascular;  Laterality: N/A;  . EXTRACORPOREAL SHOCK WAVE LITHOTRIPSY Left 04/12/2016   Procedure: LEFT EXTRACORPOREAL SHOCK WAVE LITHOTRIPSY (ESWL);  Surgeon: Irine Seal, MD;  Location: [facility];  Service: Urology;  Laterality: Left;  . INGUINAL HERNIA REPAIR Bilateral 2004  . INGUINAL HERNIA REPAIR Bilateral 2004  . MOHS SURGERY Right    "temple"  . SQUAMOUS CELL CARCINOMA EXCISION     chest or back or left arm    reports that he quit smoking about 34 years ago. His smoking use included cigarettes. He has a 51.00 pack-year smoking history. he has never used smokeless tobacco. He reports that he does not drink alcohol or use drugs. family history includes Cancer in his brother; Colon cancer (age of onset: 51) in his cousin; Heart failure in his mother; Stroke in his brother. Allergies  Allergen Reactions  . Pneumococcal Vaccines Swelling    Shoulder to elbow swelling  . Tape Other (See Comments)    Please use "paper" tape    Outpatient Medications Prior to Visit  Medication Sig Dispense Refill  . acyclovir (ZOVIRAX) 400 MG tablet Take 1 tablet (400 mg total) by mouth 3 (three) times daily. (Patient taking differently: Take 400 mg by mouth daily. ) 90 tablet 3  . atorvastatin (LIPITOR) 80 MG tablet TAKE 1 TABLET (80 MG TOTAL)  BY MOUTH DAILY. 90 tablet 3  . cholecalciferol (VITAMIN D) 1000 UNITS tablet Take 1,000 Units by mouth daily.    . cycloSPORINE (RESTASIS) 0.05 % ophthalmic emulsion Place 1 drop into both eyes 2 (two) times daily.    Marland Kitchen ELIQUIS 5 MG TABS tablet TAKE 1 TABLET (5 MG TOTAL) BY MOUTH 2 (TWO) TIMES DAILY. 180 tablet 1  . finasteride (PROSCAR) 5 MG tablet TAKE 1 TABLET BY MOUTH DAILY 90 tablet 3  . fluticasone (FLONASE) 50 MCG/ACT nasal spray Place 1 spray into both nostrils daily as needed for allergies or rhinitis.    Marland Kitchen loratadine (CLARITIN) 10 MG tablet Take 10 mg by mouth daily as needed for allergies.    Marland Kitchen omeprazole (PRILOSEC) 20 MG  capsule Take 1 capsule (20 mg total) by mouth daily. 90 capsule 3  . omeprazole (PRILOSEC) 40 MG capsule Take 1 capsule (40 mg total) by mouth every morning. 90 capsule 3  . tamsulosin (FLOMAX) 0.4 MG CAPS capsule TAKE 1 CAPSULE (0.4 MG TOTAL) BY MOUTH DAILY. 90 capsule 3  . amLODipine (NORVASC) 5 MG tablet Take 1 tablet (5 mg total) by mouth daily. 30 tablet 3  . diltiazem (CARDIZEM CD) 240 MG 24 hr capsule Take 1 capsule (240 mg total) by mouth daily. 30 capsule 3  . hydroxypropyl methylcellulose / hypromellose (ISOPTO TEARS / GONIOVISC) 2.5 % ophthalmic solution Place 1 drop into both eyes daily as needed for dry eyes.    Marland Kitchen levothyroxine (SYNTHROID, LEVOTHROID) 25 MCG tablet Take 1 tablet (25 mcg total) by mouth daily before breakfast. 90 tablet 3  . Multiple Vitamins-Minerals (PRESERVISION AREDS PO) Take 1 mg by mouth 2 (two) times daily.     . Vitamins-Lipotropics (LIPOFLAVONOID PO) Take 1 tablet by mouth 3 (three) times daily.     Marland Kitchen vortioxetine HBr (TRINTELLIX) 10 MG TABS Take 1 tablet (10 mg total) by mouth daily. 90 tablet 1   No facility-administered medications prior to visit.     ROS Review of Systems  Constitutional: Negative for diaphoresis, fatigue and unexpected weight change.  HENT: Negative.   Eyes: Negative for visual disturbance.  Respiratory: Negative for cough, chest tightness, shortness of breath and wheezing.   Cardiovascular: Negative for chest pain, palpitations and leg swelling.  Gastrointestinal: Negative.  Negative for abdominal pain, constipation, diarrhea, nausea and vomiting.  Endocrine: Negative.  Negative for cold intolerance and heat intolerance.  Genitourinary: Negative.  Negative for difficulty urinating, scrotal swelling and testicular pain.  Musculoskeletal: Negative for arthralgias and myalgias.  Skin: Negative.  Negative for rash.  Neurological: Negative.  Negative for dizziness.  Hematological: Negative for adenopathy. Does not bruise/bleed easily.   Psychiatric/Behavioral: Negative.     Objective:  BP 120/60 (BP Location: Left Arm, Patient Position: Sitting, Cuff Size: Normal)   Pulse 72   Temp 97.6 F (36.4 C) (Oral)   Resp 16   Ht 5\' 8"  (1.727 m)   Wt 127 lb (57.6 kg)   SpO2 98%   BMI 19.31 kg/m   BP Readings from Last 3 Encounters:  04/20/17 120/60  04/05/17 118/62  03/21/17 118/68    Wt Readings from Last 3 Encounters:  04/20/17 127 lb (57.6 kg)  04/05/17 127 lb (57.6 kg)  03/21/17 130 lb 9.6 oz (59.2 kg)    Physical Exam  Constitutional: No distress.  HENT:  Mouth/Throat: Oropharynx is clear and moist. No oropharyngeal exudate.  Eyes: Conjunctivae are normal. Left eye exhibits no discharge. No scleral icterus.  Neck: Normal range  of motion. Neck supple. No JVD present. No thyromegaly present.  Cardiovascular: Normal rate, regular rhythm and normal heart sounds. Exam reveals no gallop.  No murmur heard. Pulmonary/Chest: Effort normal and breath sounds normal. No respiratory distress. He has no wheezes. He has no rales.  Abdominal: Soft. Bowel sounds are normal. He exhibits no distension and no mass. There is no tenderness. Hernia confirmed negative in the right inguinal area and confirmed negative in the left inguinal area.  Genitourinary: Rectum normal, prostate normal, testes normal and penis normal. Rectal exam shows no external hemorrhoid, no internal hemorrhoid, no fissure, no mass, no tenderness, anal tone normal and guaiac negative stool. Prostate is not enlarged and not tender. Right testis shows no mass, no swelling and no tenderness. Left testis shows no mass, no swelling and no tenderness. Circumcised. No penile erythema or penile tenderness. No discharge found.  Lymphadenopathy:    He has no cervical adenopathy.       Right: No inguinal adenopathy present.       Left: No inguinal adenopathy present.  Skin: He is not diaphoretic.  Vitals reviewed.   Lab Results  Component Value Date   WBC 7.5  04/20/2017   HGB 15.1 04/20/2017   HCT 43.7 04/20/2017   PLT 241.0 04/20/2017   GLUCOSE 101 (H) 04/20/2017   CHOL 105 04/20/2017   TRIG 108.0 04/20/2017   HDL 43.90 04/20/2017   LDLCALC 39 04/20/2017   ALT 19 04/20/2017   AST 17 04/20/2017   NA 144 04/20/2017   K 4.0 04/20/2017   CL 107 04/20/2017   CREATININE 0.86 04/20/2017   BUN 10 04/20/2017   CO2 26 04/20/2017   TSH 3.31 04/20/2017   PSA 0.16 04/20/2017   INR 1.0 04/15/2011   HGBA1C 5.1 04/15/2011    No results found.  Assessment & Plan:   Roy Long was seen today for hypothyroidism, hyperlipidemia and annual exam.  Diagnoses and all orders for this visit:  Coronary artery disease involving native coronary artery of native heart without angina pectoris- He is active and has had no symptoms suggestive of angina.  Will continue risk factor modification. -     Lipid panel; Future  Acquired hypothyroidism- His TSH is in the normal range.  He will remain on dose of levothyroxine. -     CBC with Differential/Platelet; Future -     TSH; Future -     levothyroxine (SYNTHROID, LEVOTHROID) 25 MCG tablet; Take 1 tablet (25 mcg total) by mouth daily before breakfast.  Benign prostatic hyperplasia without lower urinary tract symptoms- His PSA remains low so I am not concerned about prostate cancer.  His symptoms are adequately well controlled. -     PSA; Future  Hyperlipidemia with target LDL less than 70- He has achieved his LDL goal and is doing well on the statin. -     CBC with Differential/Platelet; Future -     Comprehensive metabolic panel; Future -     Lipid panel; Future  Routine general medical examination at a health care facility  Depression with anxiety- Will continue Trintellix at the current dose. -     vortioxetine HBr (TRINTELLIX) 10 MG TABS; Take 1 tablet (10 mg total) by mouth daily.   I have discontinued Jefry L. Durante's Vitamins-Lipotropics (LIPOFLAVONOID PO), Multiple Vitamins-Minerals (PRESERVISION  AREDS PO), and hydroxypropyl methylcellulose / hypromellose. I am also having him maintain his cycloSPORINE, cholecalciferol, acyclovir, loratadine, fluticasone, atorvastatin, ELIQUIS, omeprazole, omeprazole, finasteride, tamsulosin, levothyroxine, and vortioxetine HBr.  Meds  ordered this encounter  Medications  . levothyroxine (SYNTHROID, LEVOTHROID) 25 MCG tablet    Sig: Take 1 tablet (25 mcg total) by mouth daily before breakfast.    Dispense:  90 tablet    Refill:  1  . vortioxetine HBr (TRINTELLIX) 10 MG TABS    Sig: Take 1 tablet (10 mg total) by mouth daily.    Dispense:  90 tablet    Refill:  1   See AVS for instructions about healthy living and anticipatory guidance.  Follow-up: Return in about 6 months (around 10/18/2017).  [name], MD

## 2017-04-21 ENCOUNTER — Encounter: Payer: Self-pay | Admitting: Internal Medicine

## 2017-04-21 ENCOUNTER — Other Ambulatory Visit: Payer: Self-pay | Admitting: Internal Medicine

## 2017-04-21 DIAGNOSIS — F418 Other specified anxiety disorders: Secondary | ICD-10-CM

## 2017-04-21 MED ORDER — VORTIOXETINE HBR 10 MG PO TABS: 1.0000 | ORAL_TABLET | Freq: Every day | ORAL | 1 refills | Status: DC

## 2017-04-21 MED ORDER — LEVOTHYROXINE SODIUM 25 MCG PO TABS: 25.0000 ug | ORAL_TABLET | Freq: Every day | ORAL | 1 refills | Status: DC

## 2017-04-22 ENCOUNTER — Encounter: Payer: Self-pay | Admitting: Internal Medicine

## 2017-04-22 NOTE — Assessment & Plan Note (Signed)

## 2017-05-17 ENCOUNTER — Other Ambulatory Visit: Payer: Self-pay | Admitting: Cardiology

## 2017-05-26 ENCOUNTER — Other Ambulatory Visit: Payer: Self-pay | Admitting: Internal Medicine

## 2017-08-03 ENCOUNTER — Ambulatory Visit: Payer: Medicare Other | Admitting: Internal Medicine

## 2017-08-03 ENCOUNTER — Encounter: Payer: Self-pay | Admitting: Internal Medicine

## 2017-08-03 VITALS — BP 144/78 | HR 68 | Temp 97.8°F | Resp 16 | Ht 68.0 in | Wt 131.0 lb

## 2017-08-03 DIAGNOSIS — I10 Essential (primary) hypertension: Secondary | ICD-10-CM

## 2017-08-03 DIAGNOSIS — I2510 Atherosclerotic heart disease of native coronary artery without angina pectoris: Secondary | ICD-10-CM | POA: Diagnosis not present

## 2017-08-03 MED ORDER — OLMESARTAN MEDOXOMIL 20 MG PO TABS: 20.0000 mg | ORAL_TABLET | Freq: Every day | ORAL | 1 refills | Status: DC

## 2017-08-03 NOTE — Progress Notes (Signed)
Subjective:  Patient ID: Roy Long, male    DOB: 03-12-1951  Age: 67 y.o. MRN: [id number]  CC: Hypertension   HPI Roy Long presents for f/up - for the last 2 months he has had intermittent episodes of painless swelling around his ankles and feet.  He tells me that he saw his cardiologist about 2 or 3 months ago and there was some concern that his blood pressure was not adequately well controlled so amlodipine was added to diltiazem.  He says the swelling goes away when he elevates his feet.  He has gotten confused a couple of times with his medication and he took extra doses of tamsulosin and did not take the amlodipine or diltiazem for a day or 2 and he noticed that the swelling around his ankle and feet resolved.  Outpatient Medications Prior to Visit  Medication Sig Dispense Refill  . acyclovir (ZOVIRAX) 400 MG tablet TAKE 1 TABLET (400 MG TOTAL) BY MOUTH 3 (THREE) TIMES DAILY. 90 tablet 2  . atorvastatin (LIPITOR) 80 MG tablet TAKE 1 TABLET (80 MG TOTAL) BY MOUTH DAILY. 90 tablet 3  . cholecalciferol (VITAMIN D) 1000 UNITS tablet Take 1,000 Units by mouth daily.    . cycloSPORINE (RESTASIS) 0.05 % ophthalmic emulsion Place 1 drop into both eyes 2 (two) times daily.    Marland Kitchen ELIQUIS 5 MG TABS tablet TAKE 1 TABLET BY MOUTH TWICE A DAY 180 tablet 1  . finasteride (PROSCAR) 5 MG tablet TAKE 1 TABLET BY MOUTH DAILY 90 tablet 3  . fluticasone (FLONASE) 50 MCG/ACT nasal spray Place 1 spray into both nostrils daily as needed for allergies or rhinitis.    Marland Kitchen levothyroxine (SYNTHROID, LEVOTHROID) 25 MCG tablet Take 1 tablet (25 mcg total) by mouth daily before breakfast. 90 tablet 1  . loratadine (CLARITIN) 10 MG tablet Take 10 mg by mouth daily as needed for allergies.    Marland Kitchen omeprazole (PRILOSEC) 20 MG capsule Take 1 capsule (20 mg total) by mouth daily. 90 capsule 3  . omeprazole (PRILOSEC) 40 MG capsule Take 1 capsule (40 mg total) by mouth every morning. 90 capsule 3  . tamsulosin (FLOMAX)  0.4 MG CAPS capsule TAKE 1 CAPSULE (0.4 MG TOTAL) BY MOUTH DAILY. 90 capsule 3  . vortioxetine HBr (TRINTELLIX) 10 MG TABS Take 1 tablet (10 mg total) by mouth daily. 90 tablet 1  . diltiazem (CARDIZEM CD) 240 MG 24 hr capsule Take 1 capsule (240 mg total) by mouth daily. 90 capsule 3  . amLODipine (NORVASC) 5 MG tablet Take 1 tablet (5 mg total) by mouth daily. 90 tablet 3   No facility-administered medications prior to visit.     ROS Review of Systems  Constitutional: Negative.  Negative for appetite change, diaphoresis, fatigue and unexpected weight change.  HENT: Negative.   Eyes: Negative.   Respiratory: Negative.  Negative for cough, chest tightness, shortness of breath and wheezing.   Cardiovascular: Positive for leg swelling. Negative for chest pain and palpitations.  Gastrointestinal: Negative.  Negative for abdominal pain, constipation, diarrhea, nausea and vomiting.  Endocrine: Negative.   Genitourinary: Negative.  Negative for difficulty urinating.  Musculoskeletal: Negative.  Negative for arthralgias, myalgias and neck pain.  Skin: Negative.   Neurological: Negative.  Negative for dizziness, weakness, light-headedness and headaches.  Hematological: Negative for adenopathy. Does not bruise/bleed easily.  Psychiatric/Behavioral: Negative.     Objective:  BP (!) 144/78 (BP Location: Left Arm, Patient Position: Sitting, Cuff Size: Normal)   Pulse 68  Temp 97.8 F (36.6 C) (Oral)   Resp 16   Ht 5\' 8"  (1.727 m)   Wt 131 lb (59.4 kg)   SpO2 99%   BMI 19.92 kg/m   BP Readings from Last 3 Encounters:  08/03/17 (!) 144/78  04/20/17 120/60  04/05/17 118/62    Wt Readings from Last 3 Encounters:  08/03/17 131 lb (59.4 kg)  04/20/17 127 lb (57.6 kg)  04/05/17 127 lb (57.6 kg)    Physical Exam  Constitutional: He is oriented to person, place, and time. No distress.  HENT:  Mouth/Throat: Oropharynx is clear and moist. No oropharyngeal exudate.  Eyes: Conjunctivae  are normal. No scleral icterus.  Neck: Normal range of motion. Neck supple. No JVD present. No tracheal deviation present. No thyromegaly present.  Cardiovascular: Normal rate, regular rhythm and normal heart sounds. Exam reveals no gallop and no friction rub.  No murmur heard. Pulses:      Carotid pulses are 1+ on the right side, and 1+ on the left side.      Radial pulses are 1+ on the right side, and 1+ on the left side.       Femoral pulses are 1+ on the right side, and 1+ on the left side.      Popliteal pulses are 1+ on the right side, and 1+ on the left side.       Dorsalis pedis pulses are 1+ on the right side, and 1+ on the left side.       Posterior tibial pulses are 1+ on the right side, and 1+ on the left side.  Pulmonary/Chest: Effort normal and breath sounds normal. He has no wheezes. He has no rales.  Abdominal: Soft. Bowel sounds are normal. There is no tenderness.  Musculoskeletal: Normal range of motion. He exhibits no edema, tenderness or deformity.  There is no edema or swelling today.  Sensation and capillary refill is excellent in the feet.  Lymphadenopathy:    He has no cervical adenopathy.  Neurological: He is alert and oriented to person, place, and time.  Skin: Skin is warm and dry. He is not diaphoretic.  Vitals reviewed.   Lab Results  Component Value Date   WBC 7.5 04/20/2017   HGB 15.1 04/20/2017   HCT 43.7 04/20/2017   PLT 241.0 04/20/2017   GLUCOSE 101 (H) 04/20/2017   CHOL 105 04/20/2017   TRIG 108.0 04/20/2017   HDL 43.90 04/20/2017   LDLCALC 39 04/20/2017   ALT 19 04/20/2017   AST 17 04/20/2017   NA 144 04/20/2017   K 4.0 04/20/2017   CL 107 04/20/2017   CREATININE 0.86 04/20/2017   BUN 10 04/20/2017   CO2 26 04/20/2017   TSH 3.31 04/20/2017   PSA 0.16 04/20/2017   INR 1.0 04/15/2011   HGBA1C 5.1 04/15/2011    No results found.  Assessment & Plan:   Roy Long was seen today for hypertension.  Diagnoses and all orders for this  visit:  Essential hypertension- Based on his symptoms and exam I think the lower extremity edema has been caused by the combination of amlodipine and diltiazem.  I think he still needs to take diltiazem for control of atrial fibrillation/flutter but I do not think amlodipine is a good choice for blood pressure control.  His blood pressure today is not adequately well controlled so I have asked him to add an ARB to the diltiazem. -     olmesartan (BENICAR) 20 MG tablet; Take 1 tablet (20  mg total) by mouth daily.  Coronary artery disease involving native coronary artery of native heart without angina pectoris -     olmesartan (BENICAR) 20 MG tablet; Take 1 tablet (20 mg total) by mouth daily.   I have discontinued Burk L. Loh's amLODipine. I am also having him start on olmesartan. Additionally, I am having him maintain his cycloSPORINE, cholecalciferol, loratadine, fluticasone, atorvastatin, omeprazole, omeprazole, finasteride, tamsulosin, diltiazem, levothyroxine, vortioxetine HBr, ELIQUIS, and acyclovir.  Meds ordered this encounter  Medications  . olmesartan (BENICAR) 20 MG tablet    Sig: Take 1 tablet (20 mg total) by mouth daily.    Dispense:  90 tablet    Refill:  1     Follow-up: Return in about 3 months (around 11/03/2017).  [name], MD

## 2017-08-03 NOTE — Patient Instructions (Signed)

## 2017-09-27 NOTE — Progress Notes (Signed)
Electrophysiology Office Note   Date:  09/28/2017   ID:  Roy Long, Roy Long 1950-10-03, MRN [id number]  PCP:  Janith Lima, MD  Cardiologist:  Peter Martinique Primary Electrophysiologist:  Constance Haw, MD    Chief Complaint  Patient presents with  . Follow-up    PAF     History of Present Illness: Roy Long is a 67 y.o. male who presents today for electrophysiology evaluation.   He has a history of atrial fibrillation on flecainide, hypertension, hyperlipidemia, depression. He was admitted to [facility] on 12/ 8/ 16 with palpitations noting atrial flutter with 2 to one conduction. He has atrial fibrillation because back to the early 2006 and has also had atrial flutter during that time. He underwent cardioversion in 2003 and has had one to 2 episodes of atrial fibrillation every 6 months control pill the pocket strategy with flecainide. He has had multiple episodes over the last 3-4 months occurring 4-5 times.  He had ablation for AF on 05/30/15.  Today, denies symptoms of palpitations, chest pain, shortness of breath, orthopnea, PND, lower extremity edema, claudication, dizziness, presyncope, syncope, bleeding, or neurologic sequela. The patient is tolerating medications without difficulties.  Overall he is doing well.  He has not had prolonged episodes of atrial fibrillation.  That he has had blood in his urine.  He has been seeing his urologist and is worried that it could be a kidney stone issue.  Otherwise he is felt well.    Past Medical History:  Diagnosis Date  . Age-related macular degeneration, dry, left eye   . Alcohol dependence ([place])   . Arthritis    "maybe a little bit in my left knee and right forefinger" (05/30/2015)  . Atrial fibrillation ([place])    ablation 05-2015 with no reoccurance as of 09-2015  . Basal cell carcinoma   . Depression    hx  . Family history of adverse reaction to anesthesia    "father had head injuries and dementia; everytime he was  put under less of his mentation came back"  . Gastroparesis   . GERD (gastroesophageal reflux disease)   . History of kidney stones   . Hypercholesterolemia dx'd 02/2015  . Hypertension   . Hypothyroidism   . Kidney stones   . Migraine aura without headache    "very rare now" (05/30/2015)  . Pneumonia ~ 2004  . Squamous carcinoma    hand, chest   Past Surgical History:  Procedure Laterality Date  . ANTERIOR CERVICAL DECOMP/DISCECTOMY FUSION  04/2009  . ATRIAL FIBRILLATION ABLATION  05/30/2015  . BACK SURGERY    . BASAL CELL CARCINOMA EXCISION     chest or back or left arm  . CARDIOVERSION  06/2002  . CATARACT EXTRACTION W/ INTRAOCULAR LENS IMPLANT Left ~ 2012  . COLONOSCOPY    . COLONOSCOPY W/ POLYPECTOMY  02/2003  . CYSTOSCOPY W/ STONE MANIPULATION  1980's   "basketted it out; no stent"  . ELECTROPHYSIOLOGIC STUDY N/A 05/30/2015   Procedure: Atrial Fibrillation Ablation;  Surgeon: Shaurya Rawdon Meredith Leeds, MD;  Location: [facility];  Service: Cardiovascular;  Laterality: N/A;  . EXTRACORPOREAL SHOCK WAVE LITHOTRIPSY Left 04/12/2016   Procedure: LEFT EXTRACORPOREAL SHOCK WAVE LITHOTRIPSY (ESWL);  Surgeon: Irine Seal, MD;  Location: [facility];  Service: Urology;  Laterality: Left;  . INGUINAL HERNIA REPAIR Bilateral 2004  . INGUINAL HERNIA REPAIR Bilateral 2004  . MOHS SURGERY Right    "temple"  . SQUAMOUS CELL CARCINOMA EXCISION  chest or back or left arm     Current Outpatient Medications  Medication Sig Dispense Refill  . acyclovir (ZOVIRAX) 400 MG tablet TAKE 1 TABLET (400 MG TOTAL) BY MOUTH 3 (THREE) TIMES DAILY. 90 tablet 2  . atorvastatin (LIPITOR) 80 MG tablet TAKE 1 TABLET (80 MG TOTAL) BY MOUTH DAILY. 90 tablet 3  . cholecalciferol (VITAMIN D) 1000 UNITS tablet Take 1,000 Units by mouth daily.    . cycloSPORINE (RESTASIS) 0.05 % ophthalmic emulsion Place 1 drop into both eyes 2 (two) times daily.    Marland Kitchen diltiazem (CARDIZEM CD) 240 MG 24 hr capsule Take 1 capsule (240 mg  total) by mouth daily. 90 capsule 3  . ELIQUIS 5 MG TABS tablet TAKE 1 TABLET BY MOUTH TWICE A DAY 180 tablet 1  . finasteride (PROSCAR) 5 MG tablet TAKE 1 TABLET BY MOUTH DAILY 90 tablet 3  . levothyroxine (SYNTHROID, LEVOTHROID) 25 MCG tablet Take 1 tablet (25 mcg total) by mouth daily before breakfast. 90 tablet 1  . loratadine (CLARITIN) 10 MG tablet Take 10 mg by mouth daily as needed for allergies.    Marland Kitchen omeprazole (PRILOSEC) 20 MG capsule Take 1 capsule (20 mg total) by mouth daily. 90 capsule 3  . omeprazole (PRILOSEC) 40 MG capsule Take 1 capsule (40 mg total) by mouth every morning. 90 capsule 3  . tamsulosin (FLOMAX) 0.4 MG CAPS capsule TAKE 1 CAPSULE (0.4 MG TOTAL) BY MOUTH DAILY. 90 capsule 3  . vortioxetine HBr (TRINTELLIX) 10 MG TABS Take 1 tablet (10 mg total) by mouth daily. 90 tablet 1   No current facility-administered medications for this visit.     Allergies:   Pneumococcal vaccines; Tape; and Amlodipine   Social History:  The patient  reports that he quit smoking about 34 years ago. His smoking use included cigarettes. He has a 51.00 pack-year smoking history. He has never used smokeless tobacco. He reports that he does not drink alcohol or use drugs.   Family History:  The patient's family history includes Cancer in his brother; Colon cancer (age of onset: 56) in his cousin; Heart failure in his mother; Stroke in his brother.   ROS:  Please see the history of present illness.   Otherwise, review of systems is positive for leg swelling, blood in urine, easy bruising.   All other systems are reviewed and negative.   PHYSICAL EXAM: VS:  BP 120/62   Pulse 74   Ht 5\' 8"  (1.727 m)   Wt 133 lb 9.6 oz (60.6 kg)   BMI 20.31 kg/m  , BMI Body mass index is 20.31 kg/m. GEN: Well nourished, well developed, in no acute distress  HEENT: normal  Neck: no JVD, carotid bruits, or masses Cardiac: RRR; no murmurs, rubs, or gallops,no edema  Respiratory:  clear to auscultation  bilaterally, normal work of breathing GI: soft, nontender, nondistended, + BS MS: no deformity or atrophy  Skin: warm and dry Neuro:  Strength and sensation are intact Psych: euthymic mood, full affect  EKG:  EKG is not ordered today. Personal review of the ekg ordered 04/05/17 shows SR, PRWP, rate 69   Recent Labs: 04/20/2017: ALT 19; BUN 10; Creatinine, Ser 0.86; Hemoglobin 15.1; Platelets 241.0; Potassium 4.0; Sodium 144; TSH 3.31    Lipid Panel     Component Value Date/Time   CHOL 105 04/20/2017 1019   TRIG 108.0 04/20/2017 1019   HDL 43.90 04/20/2017 1019   CHOLHDL 2 04/20/2017 1019   VLDL 21.6 04/20/2017  [place] 04/20/2017 1019     Wt Readings from Last 3 Encounters:  09/28/17 133 lb 9.6 oz (60.6 kg)  08/03/17 131 lb (59.4 kg)  04/20/17 127 lb (57.6 kg)      Other studies Reviewed: Additional studies/ records that were reviewed today include: TTE and MPI 12/12/14 Review of the above records today demonstrates:  Normal perfusion. LVEF 64% with normal wall motion. Excellent exercise tolerance. This is a low risk study.  - Left ventricle: The cavity size was normal. Systolic function was normal. The estimated ejection fraction was in the range of 60% to 65%. Wall motion was normal; there were no regional wall motion abnormalities. Doppler parameters are consistent with abnormal left ventricular relaxation (grade 1 diastolic dysfunction). - Mitral valve: Mildly thickened leaflets .   ASSESSMENT AND PLAN:  1. Persistent atrial fibrillation/atrial flutter: Sinus rhythm today.  He continues to have short bursts that last 10 to 15 seconds.  He is currently on diltiazem which is improved his symptoms.  No changes.  This patients CHA2DS2-VASc Score and unadjusted Ischemic Stroke Rate (% per year) is equal to 2.2 % stroke rate/year from a score of 2  Above score calculated as 1 point each if present [CHF, HTN, DM, Vascular=MI/PAD/Aortic Plaque, Age  if 65-74, or Male] Above score calculated as 2 points each if present [Age > 75, or Stroke/TIA/TE]  2.  Hypertension:Controlled today.  No changes.  Current medicines are reviewed at length with the patient today.   The patient has concerns regarding his medicines.  The following changes were made today: None  Labs/ tests ordered today include:  No orders of the defined types were placed in this encounter.    Disposition:   FU with Kaedan Richert 12 months.  Signed, Lyndon Chenoweth Meredith Leeds, MD  09/28/2017 12:04 PM     [address] [place] [address] [phone] (office) [phone] (fax)

## 2017-09-28 ENCOUNTER — Ambulatory Visit: Payer: Medicare Other | Admitting: Cardiology

## 2017-09-28 ENCOUNTER — Encounter: Payer: Self-pay | Admitting: Cardiology

## 2017-09-28 VITALS — BP 120/62 | HR 74 | Ht 68.0 in | Wt 133.6 lb

## 2017-09-28 DIAGNOSIS — I10 Essential (primary) hypertension: Secondary | ICD-10-CM | POA: Diagnosis not present

## 2017-09-28 DIAGNOSIS — I480 Paroxysmal atrial fibrillation: Secondary | ICD-10-CM

## 2017-09-28 NOTE — Patient Instructions (Signed)
Medication Instructions:  Your physician recommends that you continue on your current medications as directed. Please refer to the Current Medication list given to you today.  Labwork: None ordered     *We will only notify you of abnormal results, otherwise continue current treatment plan.  Testing/Procedures: None ordered  Follow-Up: Your physician wants you to follow-up in: 1 year with Dr. [name].  You will receive a reminder letter in the mail two months in advance. If you don't receive a letter, please call our office to schedule the follow-up appointment.   * If you need a refill on your cardiac medications before your next appointment, please call your pharmacy.   *Please note that any paperwork needing to be filled out by the provider will need to be addressed at the front desk prior to seeing the provider. Please note that any FMLA, disability or other documents regarding health condition is subject to a $25.00 charge that must be received prior to completion of paperwork in the form of a money order or check.  Thank you for choosing [facility]!!   Trinidad Curet, RN [phone]  Any Other Special Instructions Will Be Listed Below (If Applicable).

## 2017-10-25 ENCOUNTER — Other Ambulatory Visit: Payer: Self-pay | Admitting: Internal Medicine

## 2017-10-25 DIAGNOSIS — F418 Other specified anxiety disorders: Secondary | ICD-10-CM

## 2017-10-25 DIAGNOSIS — E039 Hypothyroidism, unspecified: Secondary | ICD-10-CM

## 2017-10-31 ENCOUNTER — Other Ambulatory Visit: Payer: Self-pay | Admitting: Cardiology

## 2017-10-31 DIAGNOSIS — E785 Hyperlipidemia, unspecified: Secondary | ICD-10-CM

## 2017-11-14 ENCOUNTER — Other Ambulatory Visit: Payer: Self-pay | Admitting: Cardiology

## 2017-11-15 ENCOUNTER — Ambulatory Visit: Payer: Medicare Other | Admitting: Internal Medicine

## 2018-01-05 ENCOUNTER — Ambulatory Visit (INDEPENDENT_AMBULATORY_CARE_PROVIDER_SITE_OTHER): Payer: Medicare Other | Admitting: Orthopedic Surgery

## 2018-01-05 ENCOUNTER — Encounter (INDEPENDENT_AMBULATORY_CARE_PROVIDER_SITE_OTHER): Payer: Self-pay | Admitting: Orthopedic Surgery

## 2018-01-05 ENCOUNTER — Ambulatory Visit (INDEPENDENT_AMBULATORY_CARE_PROVIDER_SITE_OTHER): Payer: Self-pay

## 2018-01-05 DIAGNOSIS — M1712 Unilateral primary osteoarthritis, left knee: Secondary | ICD-10-CM | POA: Diagnosis not present

## 2018-01-05 DIAGNOSIS — M25562 Pain in left knee: Secondary | ICD-10-CM | POA: Diagnosis not present

## 2018-01-05 NOTE — Progress Notes (Signed)
Office Visit Note   Patient: Roy Long           Date of Birth: Jun 26, 1950           MRN: [id number] Visit Date: 01/05/2018 Requested by: Janith Lima, MD [address], [place] [address] PCP: Janith Lima, MD  Subjective: Chief Complaint  Patient presents with  . Left Knee - Pain    HPI: Patient presents with left knee pain.  Describes global pain which started in the spring when he was doing a lot of garden work.  Has not gotten much better.  Reports occasional swelling weakness but no giving way or locking.  Does have some difficulty with stairs.  He does do a lot of yoga and when he standing in the start position he has symptoms.  Takes Tylenol occasionally.  MRI scan from 2016 does show some mild medial compartment arthritis.  He does walk for exercise and at times walking is painful but he has not had a lot of pain over the last 2 days.  He denies any mechanical symptoms in the knee.  Currently the symptoms are not as bad as they were in 2016.              ROS: All systems reviewed are negative as they relate to the chief complaint within the history of present illness.  Patient denies  fevers or chills.   Assessment & Plan: Visit Diagnoses:  1. Left knee pain, unspecified chronicity   2. Unilateral primary osteoarthritis, left knee     Plan: Impression is left knee mild arthritis with reasonable looking radiographs today.  No effusion or mechanical symptoms in that left knee.  I think he likely has to manage this mild arthritis that he has in the medial compartment.  He has no flexion contracture so I think this is a relatively early problem.  If he cannot manage this with activity modification and over-the-counter medication including Tylenol and anti-inflammatories (although he cannot really take anti-inflammatories because he is on Eliquis), and I would favor an injection.  Otherwise I will see him back as needed.  No surgical indication at this  time  Follow-Up Instructions: Return if symptoms worsen or fail to improve.   Orders:  Orders Placed This Encounter  Procedures  . XR KNEE 3 VIEW LEFT   No orders of the defined types were placed in this encounter.     Procedures: No procedures performed   Clinical Data: No additional findings.  Objective: Vital Signs: There were no vitals taken for this visit.  Physical Exam:   Constitutional: Patient appears well-developed HEENT:  Head: Normocephalic Eyes:EOM are normal Neck: Normal range of motion Cardiovascular: Normal rate Pulmonary/chest: Effort normal Neurologic: Patient is alert Skin: Skin is warm Psychiatric: Patient has normal mood and affect    Ortho Exam: Ortho exam demonstrates normal gait alignment.  No effusion in either knee.  Collaterals are stable.  Cruciates are stable.  Range of motion is full.  No focal joint line tenderness today.  Pedal pulses palpable.  Specialty Comments:  No specialty comments available.  Imaging: No results found.   PMFS History: Patient Active Problem List   Diagnosis Date Noted  . Kidney stone on left side 03/30/2016  . IBS (irritable bowel syndrome) 06/27/2015  . Coronary artery disease involving native coronary artery of native heart without angina pectoris 06/26/2015  . OAB (overactive bladder) 04/16/2015  . Atrial flutter ([place])   .  Hyperlipidemia with target LDL less than 70 01/18/2014  . Depression with anxiety 10/24/2013  . BPH (benign prostatic hyperplasia) 04/23/2011  . Hypothyroidism 04/15/2011  . Routine general medical examination at a health care facility 04/15/2011  . Atrial fibrillation ([place])   . Hypertension    Past Medical History:  Diagnosis Date  . Age-related macular degeneration, dry, left eye   . Alcohol dependence ([place])   . Arthritis    "maybe a little bit in my left knee and right forefinger" (05/30/2015)  . Atrial fibrillation ([place])    ablation 05-2015 with no reoccurance as of  09-2015  . Basal cell carcinoma   . Depression    hx  . Family history of adverse reaction to anesthesia    "father had head injuries and dementia; everytime he was put under less of his mentation came back"  . Gastroparesis   . GERD (gastroesophageal reflux disease)   . History of kidney stones   . Hypercholesterolemia dx'd 02/2015  . Hypertension   . Hypothyroidism   . Kidney stones   . Migraine aura without headache    "very rare now" (05/30/2015)  . Pneumonia ~ 2004  . Squamous carcinoma    hand, chest    Family History  Problem Relation Age of Onset  . Stroke Brother   . Cancer Brother        basosquamous cell carcinoma-head  . Heart failure Mother   . Colon cancer Cousin 80  . Heart disease Neg Hx   . Hyperlipidemia Neg Hx   . Hypertension Neg Hx   . Diabetes Neg Hx     Past Surgical History:  Procedure Laterality Date  . ANTERIOR CERVICAL DECOMP/DISCECTOMY FUSION  04/2009  . ATRIAL FIBRILLATION ABLATION  05/30/2015  . BACK SURGERY    . BASAL CELL CARCINOMA EXCISION     chest or back or left arm  . CARDIOVERSION  06/2002  . CATARACT EXTRACTION W/ INTRAOCULAR LENS IMPLANT Left ~ 2012  . COLONOSCOPY    . COLONOSCOPY W/ POLYPECTOMY  02/2003  . CYSTOSCOPY W/ STONE MANIPULATION  1980's   "basketted it out; no stent"  . ELECTROPHYSIOLOGIC STUDY N/A 05/30/2015   Procedure: Atrial Fibrillation Ablation;  Surgeon: Will Meredith Leeds, MD;  Location: [facility];  Service: Cardiovascular;  Laterality: N/A;  . EXTRACORPOREAL SHOCK WAVE LITHOTRIPSY Left 04/12/2016   Procedure: LEFT EXTRACORPOREAL SHOCK WAVE LITHOTRIPSY (ESWL);  Surgeon: Irine Seal, MD;  Location: [facility];  Service: Urology;  Laterality: Left;  . INGUINAL HERNIA REPAIR Bilateral 2004  . INGUINAL HERNIA REPAIR Bilateral 2004  . MOHS SURGERY Right    "temple"  . SQUAMOUS CELL CARCINOMA EXCISION     chest or back or left arm   Social History   Occupational History  . Occupation: band Pharmacist, hospital    Comment:  retired  Tobacco Use  . Smoking status: Former Smoker    Packs/day: 3.00    Years: 17.00    Pack years: 51.00    Types: Cigarettes    Last attempt to quit: 03/30/1983    Years since quitting: 34.7  . Smokeless tobacco: Never Used  Substance and Sexual Activity  . Alcohol use: No    Alcohol/week: 0.0 standard drinks    Comment: 05/30/2015 "recovering alcoholic; dry date 4/48/1856"  . Drug use: No    Types: Marijuana, LSD, Mescaline    Comment: 03/07/2015 "stopped all drugs back in the 1980's"  . Sexual activity: Not Currently    Birth control/protection:  None

## 2018-01-17 ENCOUNTER — Other Ambulatory Visit: Payer: Self-pay | Admitting: Gastroenterology

## 2018-01-17 DIAGNOSIS — K2100 Gastro-esophageal reflux disease with esophagitis, without bleeding: Secondary | ICD-10-CM

## 2018-01-17 DIAGNOSIS — K229 Disease of esophagus, unspecified: Secondary | ICD-10-CM

## 2018-01-18 ENCOUNTER — Other Ambulatory Visit: Payer: Self-pay | Admitting: Internal Medicine

## 2018-01-18 DIAGNOSIS — K2100 Gastro-esophageal reflux disease with esophagitis, without bleeding: Secondary | ICD-10-CM

## 2018-01-18 DIAGNOSIS — K229 Disease of esophagus, unspecified: Secondary | ICD-10-CM

## 2018-02-07 ENCOUNTER — Ambulatory Visit: Payer: Medicare Other | Admitting: Internal Medicine

## 2018-02-07 ENCOUNTER — Encounter: Payer: Self-pay | Admitting: Internal Medicine

## 2018-02-07 ENCOUNTER — Other Ambulatory Visit (INDEPENDENT_AMBULATORY_CARE_PROVIDER_SITE_OTHER): Payer: Medicare Other

## 2018-02-07 VITALS — BP 160/68 | HR 68 | Temp 98.0°F | Resp 16 | Ht 68.0 in | Wt 137.5 lb

## 2018-02-07 DIAGNOSIS — I10 Essential (primary) hypertension: Secondary | ICD-10-CM

## 2018-02-07 DIAGNOSIS — E039 Hypothyroidism, unspecified: Secondary | ICD-10-CM

## 2018-02-07 DIAGNOSIS — K219 Gastro-esophageal reflux disease without esophagitis: Secondary | ICD-10-CM | POA: Diagnosis not present

## 2018-02-07 LAB — BASIC METABOLIC PANEL
BUN: 11 mg/dL (ref 6–23)
CALCIUM: 9.3 mg/dL (ref 8.4–10.5)
CHLORIDE: 103 meq/L (ref 96–112)
CO2: 29 meq/L (ref 19–32)
Creatinine, Ser: 0.79 mg/dL (ref 0.40–1.50)
GFR: 103.21 mL/min (ref >60.00)
Glucose, Bld: 93 mg/dL (ref 70–99)
Potassium: 4.1 mEq/L (ref 3.5–5.1)
SODIUM: 138 meq/L (ref 135–145)

## 2018-02-07 LAB — VITAMIN D 25 HYDROXY (VIT D DEFICIENCY, FRACTURES): VITD: 43.65 ng/mL (ref 30.00–100.00)

## 2018-02-07 LAB — TSH: TSH: 3.5 u[IU]/mL (ref 0.35–4.50)

## 2018-02-07 MED ORDER — DEXLANSOPRAZOLE 60 MG PO CPDR: 60.0000 mg | DELAYED_RELEASE_CAPSULE | Freq: Every day | ORAL | 1 refills | Status: DC

## 2018-02-07 MED ORDER — HYDRALAZINE HCL 25 MG PO TABS: 25.0000 mg | ORAL_TABLET | Freq: Three times a day (TID) | ORAL | 3 refills | Status: DC

## 2018-02-07 NOTE — Progress Notes (Signed)
Subjective:  Patient ID: Roy Long, male    DOB: Oct 07, 1950  Age: 67 y.o. MRN: [id number]  CC: Hypertension and Hypothyroidism   HPI [name] presents for f/up - He complains of persistent throat clearing and laryngitis.  He has to take omeprazole twice a day to get symptom relief.  He denies heartburn, odynophagia, dysphagia, loss of appetite, or weight loss.  Outpatient Medications Prior to Visit  Medication Sig Dispense Refill  . acyclovir (ZOVIRAX) 400 MG tablet TAKE 1 TABLET (400 MG TOTAL) BY MOUTH 3 (THREE) TIMES DAILY. 90 tablet 2  . atorvastatin (LIPITOR) 80 MG tablet TAKE 1 TABLET BY MOUTH EVERY DAY 90 tablet 3  . cholecalciferol (VITAMIN D) 1000 UNITS tablet Take 1,000 Units by mouth daily.    . cycloSPORINE (RESTASIS) 0.05 % ophthalmic emulsion Place 1 drop into both eyes 2 (two) times daily.    Marland Kitchen ELIQUIS 5 MG TABS tablet TAKE 1 TABLET BY MOUTH TWICE A DAY 180 tablet 1  . finasteride (PROSCAR) 5 MG tablet TAKE 1 TABLET BY MOUTH DAILY 90 tablet 3  . levothyroxine (SYNTHROID, LEVOTHROID) 25 MCG tablet TAKE 1 TABLET (25 MCG TOTAL) BY MOUTH DAILY BEFORE BREAKFAST. 90 tablet 1  . loratadine (CLARITIN) 10 MG tablet Take 10 mg by mouth daily as needed for allergies.    . tamsulosin (FLOMAX) 0.4 MG CAPS capsule TAKE 1 CAPSULE (0.4 MG TOTAL) BY MOUTH DAILY. 90 capsule 3  . TRINTELLIX 10 MG TABS tablet TAKE 1 TABLET BY MOUTH EVERY DAY 90 tablet 1  . omeprazole (PRILOSEC) 20 MG capsule TAKE 1 CAPSULE BY MOUTH EVERY DAY 90 capsule 1  . omeprazole (PRILOSEC) 40 MG capsule Take 1 capsule (40 mg total) by mouth every morning. 90 capsule 3  . diltiazem (CARDIZEM CD) 240 MG 24 hr capsule Take 1 capsule (240 mg total) by mouth daily. 90 capsule 3   No facility-administered medications prior to visit.     ROS Review of Systems  Constitutional: Negative.  Negative for appetite change, diaphoresis, fatigue and unexpected weight change.  HENT: Positive for voice change. Negative for  trouble swallowing.   Respiratory: Negative for cough, chest tightness, shortness of breath and wheezing.   Cardiovascular: Negative for chest pain and leg swelling.  Gastrointestinal: Negative for abdominal pain, constipation, diarrhea, nausea and vomiting.  Endocrine: Negative for cold intolerance and heat intolerance.  Genitourinary: Negative.  Negative for difficulty urinating.  Musculoskeletal: Negative.  Negative for arthralgias and myalgias.  Skin: Negative.  Negative for color change and pallor.  Neurological: Negative.  Negative for dizziness, weakness and light-headedness.  Hematological: Negative for adenopathy. Does not bruise/bleed easily.  Psychiatric/Behavioral: Negative.     Objective:  BP (!) 160/68 (BP Location: Left Arm, Patient Position: Sitting, Cuff Size: Normal)   Pulse 68   Temp 98 F (36.7 C) (Oral)   Resp 16   Ht 5\' 8"  (1.727 m)   Wt 137 lb 8 oz (62.4 kg)   SpO2 99%   BMI 20.91 kg/m   BP Readings from Last 3 Encounters:  02/07/18 (!) 160/68  09/28/17 120/62  08/03/17 (!) 144/78    Wt Readings from Last 3 Encounters:  02/07/18 137 lb 8 oz (62.4 kg)  09/28/17 133 lb 9.6 oz (60.6 kg)  08/03/17 131 lb (59.4 kg)    Physical Exam  Constitutional: He is oriented to person, place, and time. No distress.  HENT:  Mouth/Throat: Oropharynx is clear and moist. No oropharyngeal exudate.  Eyes: Conjunctivae  are normal. No scleral icterus.  Neck: Normal range of motion. Neck supple. No JVD present. No thyromegaly present.  Cardiovascular: Normal rate, regular rhythm and normal heart sounds. Exam reveals no gallop and no friction rub.  No murmur heard. Pulmonary/Chest: Effort normal and breath sounds normal. No respiratory distress. He has no wheezes. He has no rhonchi. He has no rales.  Abdominal: Soft. Bowel sounds are normal. He exhibits no mass. There is no hepatosplenomegaly. There is no tenderness.  Musculoskeletal: Normal range of motion. He exhibits no  edema, tenderness or deformity.  Lymphadenopathy:    He has no cervical adenopathy.  Neurological: He is alert and oriented to person, place, and time.  Skin: Skin is warm and dry. No rash noted. He is not diaphoretic.  Vitals reviewed.   Lab Results  Component Value Date   WBC 7.5 04/20/2017   HGB 15.1 04/20/2017   HCT 43.7 04/20/2017   PLT 241.0 04/20/2017   GLUCOSE 93 02/07/2018   CHOL 105 04/20/2017   TRIG 108.0 04/20/2017   HDL 43.90 04/20/2017   LDLCALC 39 04/20/2017   ALT 19 04/20/2017   AST 17 04/20/2017   NA 138 02/07/2018   K 4.1 02/07/2018   CL 103 02/07/2018   CREATININE 0.79 02/07/2018   BUN 11 02/07/2018   CO2 29 02/07/2018   TSH 3.50 02/07/2018   PSA 0.16 04/20/2017   INR 1.0 04/15/2011   HGBA1C 5.1 04/15/2011    No results found.  Assessment & Plan:   Griffen was seen today for hypertension and hypothyroidism.  Diagnoses and all orders for this visit:  Acquired hypothyroidism- His TSH is in the normal range.  He will remain on the current dose of levothyroxine. -     TSH; Future  Essential hypertension- His blood pressure is not adequately well controlled on the CCB.  He experienced dizziness with prior ARB therapy so he is not willing to try an ARB.  I will try to control his blood pressure with hydralazine. -     Basic metabolic panel; Future -     hydrALAZINE (APRESOLINE) 25 MG tablet; Take 1 tablet (25 mg total) by mouth 3 (three) times daily. -     VITAMIN D 25 Hydroxy (Vit-D Deficiency, Fractures); Future  Laryngopharyngeal reflux (LPR)- I will upgrade him to a more potent PPI. -     dexlansoprazole (DEXILANT) 60 MG capsule; Take 1 capsule (60 mg total) by mouth daily.   I have discontinued [name]'s omeprazole and omeprazole. I am also having him start on hydrALAZINE and dexlansoprazole. Additionally, I am having him maintain his cycloSPORINE, cholecalciferol, loratadine, finasteride, tamsulosin, diltiazem, acyclovir, levothyroxine,  TRINTELLIX, atorvastatin, and ELIQUIS.  Meds ordered this encounter  Medications  . hydrALAZINE (APRESOLINE) 25 MG tablet    Sig: Take 1 tablet (25 mg total) by mouth 3 (three) times daily.    Dispense:  90 tablet    Refill:  3  . dexlansoprazole (DEXILANT) 60 MG capsule    Sig: Take 1 capsule (60 mg total) by mouth daily.    Dispense:  90 capsule    Refill:  1     Follow-up: Return in about 2 months (around 04/09/2018).  [name], MD

## 2018-02-07 NOTE — Patient Instructions (Signed)

## 2018-02-11 ENCOUNTER — Other Ambulatory Visit: Payer: Self-pay | Admitting: Internal Medicine

## 2018-02-14 ENCOUNTER — Other Ambulatory Visit: Payer: Self-pay | Admitting: Internal Medicine

## 2018-02-25 ENCOUNTER — Other Ambulatory Visit: Payer: Self-pay | Admitting: Internal Medicine

## 2018-02-26 ENCOUNTER — Other Ambulatory Visit: Payer: Self-pay | Admitting: Internal Medicine

## 2018-02-26 DIAGNOSIS — A6000 Herpesviral infection of urogenital system, unspecified: Secondary | ICD-10-CM

## 2018-02-26 MED ORDER — ACYCLOVIR 400 MG PO TABS: 400.0000 mg | ORAL_TABLET | Freq: Three times a day (TID) | ORAL | 1 refills | Status: DC

## 2018-03-31 ENCOUNTER — Other Ambulatory Visit: Payer: Self-pay | Admitting: Internal Medicine

## 2018-03-31 DIAGNOSIS — N400 Enlarged prostate without lower urinary tract symptoms: Secondary | ICD-10-CM

## 2018-04-04 ENCOUNTER — Other Ambulatory Visit: Payer: Self-pay | Admitting: Internal Medicine

## 2018-04-04 DIAGNOSIS — E039 Hypothyroidism, unspecified: Secondary | ICD-10-CM

## 2018-04-19 ENCOUNTER — Other Ambulatory Visit: Payer: Self-pay | Admitting: Internal Medicine

## 2018-04-19 DIAGNOSIS — F418 Other specified anxiety disorders: Secondary | ICD-10-CM

## 2018-04-25 ENCOUNTER — Ambulatory Visit (INDEPENDENT_AMBULATORY_CARE_PROVIDER_SITE_OTHER): Payer: Medicare Other | Admitting: Internal Medicine

## 2018-04-25 ENCOUNTER — Ambulatory Visit (INDEPENDENT_AMBULATORY_CARE_PROVIDER_SITE_OTHER): Payer: Medicare Other | Admitting: *Deleted

## 2018-04-25 ENCOUNTER — Other Ambulatory Visit (INDEPENDENT_AMBULATORY_CARE_PROVIDER_SITE_OTHER): Payer: Medicare Other

## 2018-04-25 ENCOUNTER — Encounter: Payer: Self-pay | Admitting: Internal Medicine

## 2018-04-25 VITALS — BP 142/70 | HR 75 | Ht 68.0 in | Wt 132.0 lb

## 2018-04-25 VITALS — BP 142/70 | HR 75 | Temp 98.0°F | Ht 68.0 in | Wt 132.8 lb

## 2018-04-25 DIAGNOSIS — E039 Hypothyroidism, unspecified: Secondary | ICD-10-CM

## 2018-04-25 DIAGNOSIS — E785 Hyperlipidemia, unspecified: Secondary | ICD-10-CM

## 2018-04-25 DIAGNOSIS — K219 Gastro-esophageal reflux disease without esophagitis: Secondary | ICD-10-CM

## 2018-04-25 DIAGNOSIS — I2510 Atherosclerotic heart disease of native coronary artery without angina pectoris: Secondary | ICD-10-CM

## 2018-04-25 DIAGNOSIS — Z0000 Encounter for general adult medical examination without abnormal findings: Secondary | ICD-10-CM | POA: Diagnosis not present

## 2018-04-25 DIAGNOSIS — N200 Calculus of kidney: Secondary | ICD-10-CM

## 2018-04-25 DIAGNOSIS — N400 Enlarged prostate without lower urinary tract symptoms: Secondary | ICD-10-CM

## 2018-04-25 DIAGNOSIS — I10 Essential (primary) hypertension: Secondary | ICD-10-CM | POA: Insufficient documentation

## 2018-04-25 LAB — CBC WITH DIFFERENTIAL/PLATELET
Basophils Absolute: 0 10*3/uL (ref 0.0–0.1)
Basophils Relative: 0.6 % (ref 0.0–3.0)
Eosinophils Absolute: 0.2 10*3/uL (ref 0.0–0.7)
Eosinophils Relative: 2.4 % (ref 0.0–5.0)
HCT: 45.2 % (ref 39.0–52.0)
Hemoglobin: 15.3 g/dL (ref 13.0–17.0)
Lymphocytes Relative: 34.9 % (ref 12.0–46.0)
Lymphs Abs: 2.2 10*3/uL (ref 0.7–4.0)
MCHC: 33.8 g/dL (ref 30.0–36.0)
MCV: 92.5 fl (ref 78.0–100.0)
Monocytes Absolute: 0.6 10*3/uL (ref 0.1–1.0)
Monocytes Relative: 10.4 % (ref 3.0–12.0)
NEUTROS ABS: 3.2 10*3/uL (ref 1.4–7.7)
Neutrophils Relative %: 51.7 % (ref 43.0–77.0)
Platelets: 233 10*3/uL (ref 150.0–400.0)
RBC: 4.88 Mil/uL (ref 4.22–5.81)
RDW: 12.8 % (ref 11.5–15.5)
WBC: 6.2 10*3/uL (ref 4.0–10.5)

## 2018-04-25 LAB — COMPREHENSIVE METABOLIC PANEL
ALT: 15 U/L (ref 0–53)
AST: 16 U/L (ref 0–37)
Albumin: 4.6 g/dL (ref 3.5–5.2)
Alkaline Phosphatase: 76 U/L (ref 39–117)
BUN: 12 mg/dL (ref 6–23)
CO2: 24 mEq/L (ref 19–32)
Calcium: 9.7 mg/dL (ref 8.4–10.5)
Chloride: 107 mEq/L (ref 96–112)
Creatinine, Ser: 0.87 mg/dL (ref 0.40–1.50)
GFR: 86.82 mL/min (ref >60.00)
Glucose, Bld: 94 mg/dL (ref 70–99)
Potassium: 4.2 mEq/L (ref 3.5–5.1)
Sodium: 141 mEq/L (ref 135–145)
TOTAL PROTEIN: 6.6 g/dL (ref 6.0–8.3)
Total Bilirubin: 0.7 mg/dL (ref 0.2–1.2)

## 2018-04-25 LAB — TSH: TSH: 3.37 u[IU]/mL (ref 0.35–4.50)

## 2018-04-25 LAB — LIPID PANEL
CHOL/HDL RATIO: 3
Cholesterol: 106 mg/dL (ref 0–200)
HDL: 38.9 mg/dL — ABNORMAL LOW (ref >39.00)
LDL Cholesterol: 47 mg/dL (ref 0–99)
NONHDL: 66.82
Triglycerides: 97 mg/dL (ref 0.0–149.0)
VLDL: 19.4 mg/dL (ref 0.0–40.0)

## 2018-04-25 LAB — PSA: PSA: 0.15 ng/mL (ref 0.10–4.00)

## 2018-04-25 MED ORDER — HYDRALAZINE HCL 50 MG PO TABS: 50.0000 mg | ORAL_TABLET | Freq: Three times a day (TID) | ORAL | 1 refills | Status: DC

## 2018-04-25 MED ORDER — OMEPRAZOLE 40 MG PO CPDR: 40.0000 mg | DELAYED_RELEASE_CAPSULE | Freq: Every day | ORAL | 1 refills | Status: DC

## 2018-04-25 NOTE — Progress Notes (Addendum)
Subjective:   Roy Long is a 68 y.o. male who presents for Medicare Annual/Subsequent preventive examination.  Review of Systems:  No ROS.  Medicare Wellness Visit. Additional risk factors are reflected in the social history.  Cardiac Risk Factors include: advanced age (>52men, >89 women);dyslipidemia;male gender;hypertension Sleep patterns: has difficulty falling asleep, gets up 1 times nightly to void and sleeps 6 hours nightly. Naps during the day. Sleep tips discussed.  Home Safety/Smoke Alarms: Feels safe in home. Smoke alarms in place.  Living environment; residence and Firearm Safety: 1-story house/ trailer. Lives alone, no needs for DME, good support system Seat Belt Safety/Bike Helmet: Wears seat belt.   PSA-  Lab Results  Component Value Date   PSA 0.16 04/20/2017   PSA 0.15 03/18/2016   PSA 0.17 02/26/2015       Objective:    Vitals: BP (!) 142/70   Pulse 75   Ht 5\' 8"  (1.727 m)   Wt 132 lb (59.9 kg)   SpO2 98%   BMI 20.07 kg/m   Body mass index is 20.07 kg/m.  Advanced Directives 04/25/2018 09/27/2016 04/12/2016 03/24/2016 10/13/2015 09/17/2015 05/30/2015  Does Patient Have a Medical Advance Directive? Yes Yes No Yes Yes Yes Yes  Type of Paramedic of [place];Living will - - [place];Living will [place];Living will [place];Living will [place];Living will  Does patient want to make changes to medical advance directive? Yes (ED - Information included in AVS) - - - - - No - Patient declined  Copy of [place] in Chart? Yes - validated most recent copy scanned in chart (See row information) - - Yes - - Yes  Would patient like information on creating a medical advance directive? - - No - Patient declined - - - -    Tobacco Social History   Tobacco Use  Smoking Status Former Smoker  . Packs/day: 3.00  . Years: 17.00  . Pack years: 51.00   . Types: Cigarettes  . Last attempt to quit: 03/30/1983  . Years since quitting: 35.0  Smokeless Tobacco Never Used     Counseling given: Not Answered  Past Medical History:  Diagnosis Date  . Age-related macular degeneration, dry, left eye   . Alcohol dependence ([place])   . Arthritis    "maybe a little bit in my left knee and right forefinger" (05/30/2015)  . Atrial fibrillation ([place])    ablation 05-2015 with no reoccurance as of 09-2015  . Basal cell carcinoma   . Depression    hx  . Family history of adverse reaction to anesthesia    "father had head injuries and dementia; everytime he was put under less of his mentation came back"  . Gastroparesis   . GERD (gastroesophageal reflux disease)   . History of kidney stones   . Hypercholesterolemia dx'd 02/2015  . Hypertension   . Hypothyroidism   . Kidney stones   . Migraine aura without headache    "very rare now" (05/30/2015)  . Pneumonia ~ 2004  . Squamous carcinoma    hand, chest   Past Surgical History:  Procedure Laterality Date  . ANTERIOR CERVICAL DECOMP/DISCECTOMY FUSION  04/2009  . ATRIAL FIBRILLATION ABLATION  05/30/2015  . BACK SURGERY    . BASAL CELL CARCINOMA EXCISION     chest or back or left arm  . CARDIOVERSION  06/2002  . CATARACT EXTRACTION W/ INTRAOCULAR LENS IMPLANT Left ~  2012  . COLONOSCOPY    . COLONOSCOPY W/ POLYPECTOMY  02/2003  . CYSTOSCOPY W/ STONE MANIPULATION  1980's   "basketted it out; no stent"  . ELECTROPHYSIOLOGIC STUDY N/A 05/30/2015   Procedure: Atrial Fibrillation Ablation;  Surgeon: Will Meredith Leeds, MD;  Location: [facility];  Service: Cardiovascular;  Laterality: N/A;  . EXTRACORPOREAL SHOCK WAVE LITHOTRIPSY Left 04/12/2016   Procedure: LEFT EXTRACORPOREAL SHOCK WAVE LITHOTRIPSY (ESWL);  Surgeon: Irine Seal, MD;  Location: [facility];  Service: Urology;  Laterality: Left;  . INGUINAL HERNIA REPAIR Bilateral 2004  . INGUINAL HERNIA REPAIR Bilateral 2004  . MOHS SURGERY Right     "temple"  . SQUAMOUS CELL CARCINOMA EXCISION     chest or back or left arm   Family History  Problem Relation Age of Onset  . Stroke Brother   . Cancer Brother        basosquamous cell carcinoma-head  . Heart failure Mother   . Colon cancer Cousin 27  . Heart disease Neg Hx   . Hyperlipidemia Neg Hx   . Hypertension Neg Hx   . Diabetes Neg Hx    Social History   Socioeconomic History  . Marital status: Single    Spouse name: Not on file  . Number of children: 0  . Years of education: Not on file  . Highest education level: Not on file  Occupational History  . Occupation: band Pharmacist, hospital    Comment: retired  Scientific laboratory technician  . Financial resource strain: Not hard at all  . Food insecurity:    Worry: Never true    Inability: Never true  . Transportation needs:    Medical: No    Non-medical: No  Tobacco Use  . Smoking status: Former Smoker    Packs/day: 3.00    Years: 17.00    Pack years: 51.00    Types: Cigarettes    Last attempt to quit: 03/30/1983    Years since quitting: 35.0  . Smokeless tobacco: Never Used  Substance and Sexual Activity  . Alcohol use: No    Alcohol/week: 0.0 standard drinks    Comment: 05/30/2015 "recovering alcoholic; dry date 7/85/8850"  . Drug use: No    Types: Marijuana, LSD, Mescaline    Comment: 03/07/2015 "stopped all drugs back in the 1980's"  . Sexual activity: Not Currently    Birth control/protection: None  Lifestyle  . Physical activity:    Days per week: 5 days    Minutes per session: 50 min  . Stress: Only a little  Relationships  . Social connections:    Talks on phone: More than three times a week    Gets together: Three times a week    Attends religious service: Not on file    Active member of club or organization: Yes    Attends meetings of clubs or organizations: More than 4 times per year    Relationship status: Not on file  Other Topics Concern  . Not on file  Social History Narrative  . Not on file    Outpatient  Encounter Medications as of 04/25/2018  Medication Sig  . Multiple Vitamins-Minerals (PRESERVISION AREDS PO) Take 1 tablet by mouth daily.  Marland Kitchen acyclovir (ZOVIRAX) 400 MG tablet Take 1 tablet (400 mg total) by mouth 3 (three) times daily.  Marland Kitchen atorvastatin (LIPITOR) 80 MG tablet TAKE 1 TABLET BY MOUTH EVERY DAY  . cholecalciferol (VITAMIN D) 1000 UNITS tablet Take 1,000 Units by mouth daily.  . cycloSPORINE (RESTASIS)  0.05 % ophthalmic emulsion Place 1 drop into both eyes 2 (two) times daily.  Marland Kitchen diltiazem (CARDIZEM CD) 240 MG 24 hr capsule Take 1 capsule (240 mg total) by mouth daily.  Marland Kitchen ELIQUIS 5 MG TABS tablet TAKE 1 TABLET BY MOUTH TWICE A DAY  . finasteride (PROSCAR) 5 MG tablet TAKE 1 TABLET BY MOUTH DAILY  . levothyroxine (SYNTHROID, LEVOTHROID) 25 MCG tablet TAKE 1 TABLET (25 MCG TOTAL) BY MOUTH DAILY BEFORE BREAKFAST.  Marland Kitchen omeprazole (PRILOSEC) 40 MG capsule Take 1 capsule (40 mg total) by mouth daily.  . tamsulosin (FLOMAX) 0.4 MG CAPS capsule TAKE 1 CAPSULE (0.4 MG TOTAL) BY MOUTH DAILY.  Marland Kitchen TRINTELLIX 10 MG TABS tablet TAKE 1 TABLET BY MOUTH EVERY DAY  . [DISCONTINUED] dexlansoprazole (DEXILANT) 60 MG capsule Take 1 capsule (60 mg total) by mouth daily.  . [DISCONTINUED] hydrALAZINE (APRESOLINE) 25 MG tablet Take 1 tablet (25 mg total) by mouth 3 (three) times daily.  . [DISCONTINUED] loratadine (CLARITIN) 10 MG tablet Take 10 mg by mouth daily as needed for allergies.   No facility-administered encounter medications on file as of 04/25/2018.     Activities of Daily Living In your present state of health, do you have any difficulty performing the following activities: 04/25/2018  Hearing? N  Vision? N  Difficulty concentrating or making decisions? N  Walking or climbing stairs? N  Dressing or bathing? N  Doing errands, shopping? N  Preparing Food and eating ? N  Using the Toilet? N  In the past six months, have you accidently leaked urine? N  Do you have problems with loss of bowel  control? N  Managing your Medications? N  Managing your Finances? N  Housekeeping or managing your Housekeeping? N  Some recent data might be hidden    Patient Care Team: Janith Lima, MD as PCP - General (Internal Medicine) Constance Haw, MD as PCP - Electrophysiology (Cardiology)   Assessment:   This is a routine wellness examination for Mehmet. Physical assessment deferred to PCP.  Exercise Activities and Dietary recommendations Current Exercise Habits: Home exercise routine;Structured exercise class, Type of exercise: walking;strength training/weights, Time (Minutes): 55, Frequency (Times/Week): 5, Weekly Exercise (Minutes/Week): 275, Intensity: Mild, Exercise limited by: None identified  Diet (meal preparation, eat out, water intake, caffeinated beverages, dairy products, fruits and vegetables): in general, a "healthy" diet    Has gastroparesis which causes him to maintain a poor appetite.    Discussed supplementing with premier and/or other nutritional supplements. Encouraged patient to maintain daily water and healthy fluid intake.  Goals    . Patient Stated     Continue to exercise and do my gardening. I want to travel as much as possible.       Fall Risk Fall Risk  04/25/2018 04/22/2017 09/27/2016 03/24/2016 02/27/2015  Falls in the past year? 0 No No No No  Number falls in past yr: 0 - - - -  Injury with Fall? 0 - - - -  Follow up Falls evaluation completed - - - -    Depression Screen PHQ 2/9 Scores 04/25/2018 02/07/2018 08/03/2017 04/20/2017  PHQ - 2 Score 1 0 0 0  PHQ- 9 Score 4 4 2 3     Cognitive Function       Ad8 score reviewed for issues:  Issues making decisions: no  Less interest in hobbies / activities: no  Repeats questions, stories (family complaining): no  Trouble using ordinary gadgets (microwave, computer, phone):no  Forgets the month  or year: no  Mismanaging finances: no  Remembering appts: no  Daily problems with thinking and/or  memory: no Ad8 score is= 0  Immunization History  Administered Date(s) Administered  . DTaP 11/16/2006  . Influenza Split 01/18/2012  . Influenza, High Dose Seasonal PF 12/28/2013, 11/17/2016  . Influenza,trivalent, recombinat, inj, PF 11/16/2017  . Influenza-Unspecified 02/07/2015, 01/07/2016  . Pneumococcal Polysaccharide-23 04/07/2011  . Tdap 02/16/2012  . Zoster 05/04/2011   Screening Tests Health Maintenance  Topic Date Due  . COLONOSCOPY  03/08/2021  . TETANUS/TDAP  02/15/2022  . INFLUENZA VACCINE  Completed  . Hepatitis C Screening  Completed      Plan:    Reviewed health maintenance screenings with patient today and relevant education, vaccines, and/or referrals were provided.   Continue doing brain stimulating activities (puzzles, reading, adult coloring books, staying active) to keep memory sharp.   Continue to eat heart healthy diet (full of fruits, vegetables, whole grains, lean protein, water--limit salt, fat, and sugar intake) and increase physical activity as tolerated.  I have personally reviewed and noted the following in the patient's chart:   . Medical and social history . Use of alcohol, tobacco or illicit drugs  . Current medications and supplements . Functional ability and status . Nutritional status . Physical activity . Advanced directives . List of other physicians . Vitals . Screenings to include cognitive, depression, and falls . Referrals and appointments  In addition, I have reviewed and discussed with patient certain preventive protocols, quality metrics, and best practice recommendations. A written personalized care plan for preventive services as well as general preventive health recommendations were provided to patient.   Medical screening examination/treatment/procedure(s) were performed by non-physician practitioner and as supervising physician I was immediately available for consultation/collaboration. I agree with above. [name], MD   Michiel Cowboy, RN  04/25/2018

## 2018-04-25 NOTE — Patient Instructions (Signed)

## 2018-04-25 NOTE — Progress Notes (Signed)
Subjective:  Patient ID: Roy Long, male    DOB: January 01, 1951  Age: 68 y.o. MRN: [id number]  CC: Annual Exam; Gastroesophageal Reflux; and Hypothyroidism   HPI Roy Long presents for a CPX.  Complains that his current PPI is too expensive and he wants to switch to a generic.  He continues to have chronic throat clearing but denies heartburn, odynophagia, or dysphagia.    He is concerned that his blood pressure is not adequately well controlled.  Outpatient Medications Prior to Visit  Medication Sig Dispense Refill  . acyclovir (ZOVIRAX) 400 MG tablet Take 1 tablet (400 mg total) by mouth 3 (three) times daily. 270 tablet 1  . atorvastatin (LIPITOR) 80 MG tablet TAKE 1 TABLET BY MOUTH EVERY DAY 90 tablet 3  . cholecalciferol (VITAMIN D) 1000 UNITS tablet Take 1,000 Units by mouth daily.    . cycloSPORINE (RESTASIS) 0.05 % ophthalmic emulsion Place 1 drop into both eyes 2 (two) times daily.    Marland Kitchen ELIQUIS 5 MG TABS tablet TAKE 1 TABLET BY MOUTH TWICE A DAY 180 tablet 1  . finasteride (PROSCAR) 5 MG tablet TAKE 1 TABLET BY MOUTH DAILY 90 tablet 1  . levothyroxine (SYNTHROID, LEVOTHROID) 25 MCG tablet TAKE 1 TABLET (25 MCG TOTAL) BY MOUTH DAILY BEFORE BREAKFAST. 90 tablet 0  . tamsulosin (FLOMAX) 0.4 MG CAPS capsule TAKE 1 CAPSULE (0.4 MG TOTAL) BY MOUTH DAILY. 90 capsule 3  . TRINTELLIX 10 MG TABS tablet TAKE 1 TABLET BY MOUTH EVERY DAY 90 tablet 1  . dexlansoprazole (DEXILANT) 60 MG capsule Take 1 capsule (60 mg total) by mouth daily. 90 capsule 1  . hydrALAZINE (APRESOLINE) 25 MG tablet Take 1 tablet (25 mg total) by mouth 3 (three) times daily. 90 tablet 3  . loratadine (CLARITIN) 10 MG tablet Take 10 mg by mouth daily as needed for allergies.    Marland Kitchen diltiazem (CARDIZEM CD) 240 MG 24 hr capsule Take 1 capsule (240 mg total) by mouth daily. 90 capsule 3   No facility-administered medications prior to visit.     ROS Review of Systems  Constitutional: Negative for appetite  change, diaphoresis, fatigue and unexpected weight change.  HENT: Positive for voice change. Negative for sore throat and trouble swallowing.   Eyes: Negative for visual disturbance.  Respiratory: Negative for cough, chest tightness, shortness of breath and wheezing.   Cardiovascular: Negative for chest pain, palpitations and leg swelling.  Gastrointestinal: Negative for abdominal pain, constipation, diarrhea, nausea and vomiting.  Endocrine: Negative for cold intolerance and heat intolerance.  Genitourinary: Negative.  Negative for difficulty urinating, dysuria, flank pain, hematuria, testicular pain and urgency.  Musculoskeletal: Negative.  Negative for arthralgias and myalgias.  Skin: Negative.  Negative for color change, pallor and rash.  Neurological: Negative.  Negative for dizziness, weakness, light-headedness and numbness.  Hematological: Negative for adenopathy. Does not bruise/bleed easily.  Psychiatric/Behavioral: Negative.     Objective:  BP (!) 142/70 (BP Location: Left Arm, Patient Position: Sitting, Cuff Size: Normal)   Pulse 75   Temp 98 F (36.7 C) (Oral)   Ht 5\' 8"  (1.727 m)   Wt 132 lb 12 oz (60.2 kg)   SpO2 98%   BMI 20.18 kg/m   BP Readings from Last 3 Encounters:  04/25/18 (!) 142/70  04/25/18 (!) 142/70  02/07/18 (!) 160/68    Wt Readings from Last 3 Encounters:  04/25/18 132 lb (59.9 kg)  04/25/18 132 lb 12 oz (60.2 kg)  02/07/18 137 lb 8  oz (62.4 kg)    Physical Exam Vitals signs reviewed.  Constitutional:      Appearance: He is normal weight.  HENT:     Nose: Nose normal. No congestion or rhinorrhea.     Mouth/Throat:     Mouth: Mucous membranes are moist.     Pharynx: Oropharynx is clear. No oropharyngeal exudate or posterior oropharyngeal erythema.  Eyes:     General: No scleral icterus.    Conjunctiva/sclera: Conjunctivae normal.  Neck:     Musculoskeletal: Normal range of motion and neck supple. No neck rigidity or muscular tenderness.    Cardiovascular:     Rate and Rhythm: Normal rate and regular rhythm.     Pulses: Normal pulses.     Heart sounds: No murmur. No gallop.   Pulmonary:     Effort: Pulmonary effort is normal.     Breath sounds: No stridor. No wheezing, rhonchi or rales.  Abdominal:     General: Abdomen is flat.     Palpations: There is no hepatomegaly, splenomegaly or mass.     Tenderness: There is no abdominal tenderness.     Hernia: No hernia is present. There is no hernia in the right inguinal area or left inguinal area.  Genitourinary:    Pubic Area: No rash.      Penis: Circumcised. No discharge, swelling or lesions.      Scrotum/Testes: Normal.        Right: Mass, tenderness or swelling not present.        Left: Mass, tenderness or swelling not present.     Epididymis:     Right: Normal. Not inflamed or enlarged. No mass or tenderness.     Left: Normal. Not inflamed or enlarged. No mass or tenderness.     Prostate: Normal. Not enlarged and no nodules present.     Rectum: Normal. Guaiac result negative. No mass, tenderness, anal fissure, external hemorrhoid or internal hemorrhoid. Normal anal tone.  Musculoskeletal: Normal range of motion.        General: No swelling.     Right lower leg: No edema.     Left lower leg: No edema.  Lymphadenopathy:     Cervical: No cervical adenopathy.     Lower Body: No right inguinal adenopathy. No left inguinal adenopathy.  Skin:    General: Skin is warm and dry.     Findings: No erythema or rash.  Neurological:     General: No focal deficit present.     Mental Status: He is alert and oriented to person, place, and time.  Psychiatric:        Mood and Affect: Mood normal.        Behavior: Behavior normal.        Thought Content: Thought content normal.        Judgment: Judgment normal.     Lab Results  Component Value Date   WBC 6.2 04/25/2018   HGB 15.3 04/25/2018   HCT 45.2 04/25/2018   PLT 233.0 04/25/2018   GLUCOSE 94 04/25/2018   CHOL 106  04/25/2018   TRIG 97.0 04/25/2018   HDL 38.90 (L) 04/25/2018   LDLCALC 47 04/25/2018   ALT 15 04/25/2018   AST 16 04/25/2018   NA 141 04/25/2018   K 4.2 04/25/2018   CL 107 04/25/2018   CREATININE 0.87 04/25/2018   BUN 12 04/25/2018   CO2 24 04/25/2018   TSH 3.37 04/25/2018   PSA 0.15 04/25/2018   INR 1.0 04/15/2011  HGBA1C 5.1 04/15/2011    No results found.  Assessment & Plan:   Derrel was seen today for annual exam, gastroesophageal reflux and hypothyroidism.  Diagnoses and all orders for this visit:  Laryngopharyngeal reflux (LPR)- Will change to a generic PPI. -     CBC with Differential/Platelet; Future -     omeprazole (PRILOSEC) 40 MG capsule; Take 1 capsule (40 mg total) by mouth daily.  Acquired hypothyroidism- His TSH is in the normal range.  He will stay on the current dose of levothyroxine. -     CBC with Differential/Platelet; Future -     TSH; Future  Benign prostatic hyperplasia without lower urinary tract symptoms- His PSA is low which is reassuring that he does not have prostate cancer.  He has no symptoms that need to be treated. -     PSA; Future  Hyperlipidemia with target LDL less than 70- He has achieved his LDL goal is doing well on the statin. -     Comprehensive metabolic panel; Future -     Lipid panel; Future  Routine general medical examination at a health care facility  Kidney stone on left side  Coronary artery disease involving native coronary artery of native heart without angina pectoris- He denies any recent anginal symptoms.  I will continue to work on risk factor modifications. -     Lipid panel; Future -     hydrALAZINE (APRESOLINE) 50 MG tablet; Take 1 tablet (50 mg total) by mouth 3 (three) times daily.  Essential hypertension- I will increase the dose of hydralazine to try to get better control of his blood pressure. -     hydrALAZINE (APRESOLINE) 50 MG tablet; Take 1 tablet (50 mg total) by mouth 3 (three) times daily.   I  have discontinued Tejuan L. Paige's loratadine, hydrALAZINE, and dexlansoprazole. I am also having him start on omeprazole and hydrALAZINE. Additionally, I am having him maintain his cycloSPORINE, cholecalciferol, diltiazem, atorvastatin, ELIQUIS, finasteride, acyclovir, tamsulosin, levothyroxine, and TRINTELLIX.  Meds ordered this encounter  Medications  . omeprazole (PRILOSEC) 40 MG capsule    Sig: Take 1 capsule (40 mg total) by mouth daily.    Dispense:  90 capsule    Refill:  1  . hydrALAZINE (APRESOLINE) 50 MG tablet    Sig: Take 1 tablet (50 mg total) by mouth 3 (three) times daily.    Dispense:  270 tablet    Refill:  1     Follow-up: Return in about 6 months (around 10/24/2018).  [name], MD

## 2018-04-25 NOTE — Patient Instructions (Addendum)
Continue doing brain stimulating activities (puzzles, reading, adult coloring books, staying active) to keep memory sharp.   Continue to eat heart healthy diet (full of fruits, vegetables, whole grains, lean protein, water--limit salt, fat, and sugar intake) and increase physical activity as tolerated.   Roy Long , Thank you for taking time to come for your Medicare Wellness Visit. I appreciate your ongoing commitment to your health goals. Please review the following plan we discussed and let me know if I can assist you in the future.   These are the goals we discussed: Goals    . Patient Stated     Continue to exercise and do my gardening. I want to travel as much as possible.       This is a list of the screening recommended for you and due dates:  Health Maintenance  Topic Date Due  . Colon Cancer Screening  03/08/2021  . Tetanus Vaccine  02/15/2022  . Flu Shot  Completed  .  Hepatitis C: One time screening is recommended by Center for Disease Control  (CDC) for  adults born from [birth year] through 1967.   Completed

## 2018-05-24 ENCOUNTER — Other Ambulatory Visit: Payer: Self-pay | Admitting: *Deleted

## 2018-05-24 ENCOUNTER — Other Ambulatory Visit: Payer: Self-pay

## 2018-05-24 MED ORDER — DILTIAZEM HCL ER COATED BEADS 240 MG PO CP24: 240.0000 mg | ORAL_CAPSULE | Freq: Every day | ORAL | 0 refills | Status: DC

## 2018-05-24 MED ORDER — APIXABAN 5 MG PO TABS: 5.0000 mg | ORAL_TABLET | Freq: Two times a day (BID) | ORAL | 2 refills | Status: DC

## 2018-05-24 NOTE — Telephone Encounter (Signed)
Eliquis 5mg  refill request received; pt is 68 yrs old, wt-60.2kg, Crea-0.87 on 04/25/2018, last seen by Dr. [name] on 09/28/2017; will send in refill requested pharmacy.

## 2018-06-26 ENCOUNTER — Other Ambulatory Visit: Payer: Self-pay | Admitting: Internal Medicine

## 2018-06-26 DIAGNOSIS — E039 Hypothyroidism, unspecified: Secondary | ICD-10-CM

## 2018-08-07 ENCOUNTER — Other Ambulatory Visit: Payer: Self-pay | Admitting: Internal Medicine

## 2018-08-07 ENCOUNTER — Other Ambulatory Visit: Payer: Self-pay | Admitting: Cardiology

## 2018-09-19 ENCOUNTER — Other Ambulatory Visit: Payer: Self-pay | Admitting: Internal Medicine

## 2018-09-19 DIAGNOSIS — E039 Hypothyroidism, unspecified: Secondary | ICD-10-CM

## 2018-10-05 ENCOUNTER — Other Ambulatory Visit: Payer: Self-pay | Admitting: Cardiology

## 2018-10-05 ENCOUNTER — Other Ambulatory Visit: Payer: Self-pay | Admitting: Internal Medicine

## 2018-10-05 DIAGNOSIS — F418 Other specified anxiety disorders: Secondary | ICD-10-CM

## 2018-10-05 DIAGNOSIS — E785 Hyperlipidemia, unspecified: Secondary | ICD-10-CM

## 2018-10-05 MED ORDER — ATORVASTATIN CALCIUM 80 MG PO TABS: 80.0000 mg | ORAL_TABLET | Freq: Every day | ORAL | 0 refills | Status: DC

## 2018-10-15 ENCOUNTER — Other Ambulatory Visit: Payer: Self-pay | Admitting: Internal Medicine

## 2018-10-15 DIAGNOSIS — I2510 Atherosclerotic heart disease of native coronary artery without angina pectoris: Secondary | ICD-10-CM

## 2018-10-15 DIAGNOSIS — I10 Essential (primary) hypertension: Secondary | ICD-10-CM

## 2018-10-24 ENCOUNTER — Other Ambulatory Visit: Payer: Medicare Other

## 2018-10-24 ENCOUNTER — Other Ambulatory Visit: Payer: Self-pay | Admitting: Internal Medicine

## 2018-10-24 ENCOUNTER — Encounter: Payer: Self-pay | Admitting: Internal Medicine

## 2018-10-24 ENCOUNTER — Other Ambulatory Visit: Payer: Self-pay | Admitting: Cardiology

## 2018-10-24 ENCOUNTER — Ambulatory Visit (INDEPENDENT_AMBULATORY_CARE_PROVIDER_SITE_OTHER): Payer: Medicare Other | Admitting: Internal Medicine

## 2018-10-24 ENCOUNTER — Other Ambulatory Visit (INDEPENDENT_AMBULATORY_CARE_PROVIDER_SITE_OTHER): Payer: Medicare Other

## 2018-10-24 ENCOUNTER — Other Ambulatory Visit: Payer: Self-pay

## 2018-10-24 VITALS — BP 140/68 | HR 69 | Temp 98.2°F | Resp 16 | Ht 68.0 in | Wt 133.5 lb

## 2018-10-24 DIAGNOSIS — R0609 Other forms of dyspnea: Secondary | ICD-10-CM

## 2018-10-24 DIAGNOSIS — I480 Paroxysmal atrial fibrillation: Secondary | ICD-10-CM | POA: Diagnosis not present

## 2018-10-24 DIAGNOSIS — R0600 Dyspnea, unspecified: Secondary | ICD-10-CM

## 2018-10-24 DIAGNOSIS — I10 Essential (primary) hypertension: Secondary | ICD-10-CM | POA: Diagnosis not present

## 2018-10-24 DIAGNOSIS — E039 Hypothyroidism, unspecified: Secondary | ICD-10-CM | POA: Diagnosis not present

## 2018-10-24 DIAGNOSIS — J301 Allergic rhinitis due to pollen: Secondary | ICD-10-CM

## 2018-10-24 DIAGNOSIS — K219 Gastro-esophageal reflux disease without esophagitis: Secondary | ICD-10-CM

## 2018-10-24 LAB — CBC WITH DIFFERENTIAL/PLATELET
Basophils Absolute: 0 10*3/uL (ref 0.0–0.1)
Basophils Relative: 0.6 % (ref 0.0–3.0)
Eosinophils Absolute: 0.1 10*3/uL (ref 0.0–0.7)
Eosinophils Relative: 1.5 % (ref 0.0–5.0)
HCT: 43.5 % (ref 39.0–52.0)
Hemoglobin: 14.7 g/dL (ref 13.0–17.0)
Lymphocytes Relative: 41.5 % (ref 12.0–46.0)
Lymphs Abs: 2.9 10*3/uL (ref 0.7–4.0)
MCHC: 33.9 g/dL (ref 30.0–36.0)
MCV: 92.5 fl (ref 78.0–100.0)
Monocytes Absolute: 0.7 10*3/uL (ref 0.1–1.0)
Monocytes Relative: 9.9 % (ref 3.0–12.0)
Neutro Abs: 3.2 10*3/uL (ref 1.4–7.7)
Neutrophils Relative %: 46.5 % (ref 43.0–77.0)
Platelets: 210 10*3/uL (ref 150.0–400.0)
RBC: 4.7 Mil/uL (ref 4.22–5.81)
RDW: 12.9 % (ref 11.5–15.5)
WBC: 7 10*3/uL (ref 4.0–10.5)

## 2018-10-24 LAB — BASIC METABOLIC PANEL
BUN: 15 mg/dL (ref 6–23)
CO2: 25 mEq/L (ref 19–32)
Calcium: 9.7 mg/dL (ref 8.4–10.5)
Chloride: 103 mEq/L (ref 96–112)
Creatinine, Ser: 0.86 mg/dL (ref 0.40–1.50)
GFR: 87.86 mL/min (ref >60.00)
Glucose, Bld: 94 mg/dL (ref 70–99)
Potassium: 3.8 mEq/L (ref 3.5–5.1)
Sodium: 136 mEq/L (ref 135–145)

## 2018-10-24 LAB — BRAIN NATRIURETIC PEPTIDE: Pro B Natriuretic peptide (BNP): 18 pg/mL (ref 0.0–100.0)

## 2018-10-24 LAB — TROPONIN I (HIGH SENSITIVITY): High Sens Troponin I: <2 ng/L — ABNORMAL LOW (ref 5–17)

## 2018-10-24 LAB — TSH: TSH: 3.94 u[IU]/mL (ref 0.35–4.50)

## 2018-10-24 MED ORDER — LEVOTHYROXINE SODIUM 25 MCG PO TABS: ORAL_TABLET | ORAL | 1 refills | Status: DC

## 2018-10-24 MED ORDER — FLUTICASONE PROPIONATE 50 MCG/ACT NA SUSP: 2.0000 | Freq: Every day | NASAL | 1 refills | Status: DC

## 2018-10-24 MED ORDER — MONTELUKAST SODIUM 10 MG PO TABS: 10.0000 mg | ORAL_TABLET | Freq: Every day | ORAL | 1 refills | Status: DC

## 2018-10-24 NOTE — Progress Notes (Signed)
Subjective:  Patient ID: Roy Long, male    DOB: 03-07-1951  Age: 68 y.o. MRN: [id number]  CC: Hypertension, Hypothyroidism, Atrial Fibrillation, and Allergic Rhinitis    HPI LACY TAGLIERI presents for f/up - He is very active and complains of chronic dyspnea on exertion.  He thinks it has worsened recently but he attributes it to the hot humid weather.  He denies chest pain, diaphoresis, dizziness, or lightheadedness.  He tells me his blood pressure has been well controlled.  He complains of chronic unchanged fatigue.  Outpatient Medications Prior to Visit  Medication Sig Dispense Refill  . acyclovir (ZOVIRAX) 400 MG tablet Take 1 tablet (400 mg total) by mouth 3 (three) times daily. 270 tablet 1  . apixaban (ELIQUIS) 5 MG TABS tablet Take 1 tablet (5 mg total) by mouth 2 (two) times daily. 180 tablet 2  . atorvastatin (LIPITOR) 80 MG tablet Take 1 tablet (80 mg total) by mouth daily. Please make overdue appt with Dr. [name] before anymore refills. 1st attempt 30 tablet 0  . cholecalciferol (VITAMIN D) 1000 UNITS tablet Take 1,000 Units by mouth daily.    . cycloSPORINE (RESTASIS) 0.05 % ophthalmic emulsion Place 1 drop into both eyes 2 (two) times daily.    . finasteride (PROSCAR) 5 MG tablet TAKE 1 TABLET BY MOUTH DAILY 90 tablet 1  . hydrALAZINE (APRESOLINE) 50 MG tablet TAKE 1 TABLET BY MOUTH THREE TIMES A DAY 270 tablet 1  . Multiple Vitamins-Minerals (PRESERVISION AREDS PO) Take 1 tablet by mouth daily.    . tamsulosin (FLOMAX) 0.4 MG CAPS capsule TAKE 1 CAPSULE (0.4 MG TOTAL) BY MOUTH DAILY. 90 capsule 3  . TRINTELLIX 10 MG TABS tablet TAKE 1 TABLET BY MOUTH EVERY DAY 90 tablet 1  . diltiazem (CARDIZEM CD) 240 MG 24 hr capsule Take 1 capsule (240 mg total) by mouth daily. Please make annual appt with Dr. [name] for future refills. Thank you 90 capsule 0  . levothyroxine (SYNTHROID) 25 MCG tablet TAKE 1 TABLET BY MOUTH DAILY BEFORE BREAKFAST. 90 tablet 0  . omeprazole  (PRILOSEC) 40 MG capsule Take 1 capsule (40 mg total) by mouth daily. 90 capsule 1   No facility-administered medications prior to visit.     ROS Review of Systems  Constitutional: Positive for fatigue. Negative for diaphoresis and unexpected weight change.  HENT: Positive for congestion, postnasal drip and rhinorrhea. Negative for nosebleeds, sinus pressure, sinus pain, sneezing, sore throat, tinnitus and trouble swallowing.   Eyes: Negative for visual disturbance.  Respiratory: Positive for shortness of breath. Negative for cough and chest tightness.   Cardiovascular: Negative for chest pain, palpitations and leg swelling.  Gastrointestinal: Negative for abdominal pain, constipation, diarrhea, nausea and vomiting.  Endocrine: Negative for cold intolerance and heat intolerance.  Genitourinary: Negative.  Negative for difficulty urinating.  Musculoskeletal: Negative.  Negative for arthralgias and myalgias.  Skin: Negative.  Negative for color change.  Neurological: Negative for dizziness, weakness, light-headedness and headaches.  Hematological: Negative for adenopathy. Does not bruise/bleed easily.  Psychiatric/Behavioral: Negative.     Objective:  BP 140/68 (BP Location: Left Arm, Patient Position: Sitting, Cuff Size: Normal)   Pulse 69   Temp 98.2 F (36.8 C) (Oral)   Resp 16   Ht 5\' 8"  (1.727 m)   Wt 133 lb 8 oz (60.6 kg)   SpO2 98%   BMI 20.30 kg/m   BP Readings from Last 3 Encounters:  10/24/18 140/68  04/25/18 (!) 142/70  04/25/18 Marland Kitchen)  142/70    Wt Readings from Last 3 Encounters:  10/24/18 133 lb 8 oz (60.6 kg)  04/25/18 132 lb (59.9 kg)  04/25/18 132 lb 12 oz (60.2 kg)    Physical Exam Vitals signs reviewed.  Constitutional:      Appearance: He is not ill-appearing or diaphoretic.  HENT:     Nose: Nose normal.     Mouth/Throat:     Mouth: Mucous membranes are moist.     Pharynx: No oropharyngeal exudate.  Eyes:     General: No scleral icterus.     Conjunctiva/sclera: Conjunctivae normal.  Neck:     Musculoskeletal: Normal range of motion. No neck rigidity or muscular tenderness.  Cardiovascular:     Rate and Rhythm: Normal rate and regular rhythm.     Heart sounds: No murmur.     Comments: EKG ----  Sinus  Rhythm  -Anteroseptal infarct -age undetermined.   ABNORMAL - no change from the prior EKG Pulmonary:     Breath sounds: No wheezing, rhonchi or rales.  Abdominal:     General: Abdomen is flat. Bowel sounds are normal.     Palpations: There is no hepatomegaly or splenomegaly.     Tenderness: There is no abdominal tenderness.  Musculoskeletal: Normal range of motion.     Right lower leg: No edema.     Left lower leg: No edema.  Lymphadenopathy:     Cervical: No cervical adenopathy.  Skin:    General: Skin is warm and dry.     Coloration: Skin is not pale.  Neurological:     General: No focal deficit present.     Mental Status: He is oriented to person, place, and time. Mental status is at baseline.  Psychiatric:        Mood and Affect: Mood normal.        Behavior: Behavior normal.     Lab Results  Component Value Date   WBC 7.0 10/24/2018   HGB 14.7 10/24/2018   HCT 43.5 10/24/2018   PLT 210.0 10/24/2018   GLUCOSE 94 10/24/2018   CHOL 106 04/25/2018   TRIG 97.0 04/25/2018   HDL 38.90 (L) 04/25/2018   LDLCALC 47 04/25/2018   ALT 15 04/25/2018   AST 16 04/25/2018   NA 136 10/24/2018   K 3.8 10/24/2018   CL 103 10/24/2018   CREATININE 0.86 10/24/2018   BUN 15 10/24/2018   CO2 25 10/24/2018   TSH 3.94 10/24/2018   PSA 0.15 04/25/2018   INR 1.0 04/15/2011   HGBA1C 5.1 04/15/2011    No results found.  Assessment & Plan:   Gurshan was seen today for hypertension, hypothyroidism, atrial fibrillation and allergic rhinitis .  Diagnoses and all orders for this visit:  Essential hypertension- His blood pressure is adequately well controlled. -     CBC with Differential/Platelet; Future -     Basic  metabolic panel; Future -     EKG 12-Lead  Acquired hypothyroidism - His TSH is in the normal range.  He will remain on the current dose of levothyroxine. -     TSH; Future  DOE (dyspnea on exertion)- He underwent a Lexiscan 3 years ago that was negative for ischemia.  It sounds like his symptoms are chronic and not related to cardiac ischemia.  His EKG is negative for ischemic changes.  His BNP and troponin are normal.  He will see his cardiologist within the next month and they can decide whether or not he needs  another ischemia work-up.  For now I have asked him to work on his conditioning. -     CBC with Differential/Platelet; Future -     Brain natriuretic peptide; Future -     Troponin I (High Sensitivity); Future -     EKG 12-Lead   Paroxysmal atrial fibrillation ([place])- He is maintaining sinus rhythm.  Will continue anticoagulation with the DOAC. -     EKG 12-Lead  Seasonal allergic rhinitis due to pollen -     fluticasone (FLONASE) 50 MCG/ACT nasal spray; Place 2 sprays into both nostrils daily. -     montelukast (SINGULAIR) 10 MG tablet; Take 1 tablet (10 mg total) by mouth at bedtime.   I am having Zymeir L. Lessner start on fluticasone and montelukast. I am also having him maintain his cycloSPORINE, cholecalciferol, acyclovir, tamsulosin, Multiple Vitamins-Minerals (PRESERVISION AREDS PO), apixaban, finasteride, Trintellix, atorvastatin, hydrALAZINE, and levothyroxine.  Meds ordered this encounter  Medications  . fluticasone (FLONASE) 50 MCG/ACT nasal spray    Sig: Place 2 sprays into both nostrils daily.    Dispense:  48 g    Refill:  1  . montelukast (SINGULAIR) 10 MG tablet    Sig: Take 1 tablet (10 mg total) by mouth at bedtime.    Dispense:  90 tablet    Refill:  1  . levothyroxine (SYNTHROID) 25 MCG tablet    Sig: TAKE 1 TABLET BY MOUTH DAILY BEFORE BREAKFAST.    Dispense:  90 tablet    Refill:  1     Follow-up: Return in about 6 months (around 04/26/2019).   [name], MD

## 2018-10-24 NOTE — Patient Instructions (Signed)
Hypothyroidism  Hypothyroidism is when the thyroid gland does not make enough of certain hormones (it is underactive). The thyroid gland is a small gland located in the lower front part of the neck, just in front of the windpipe (trachea). This gland makes hormones that help control how the body uses food for energy (metabolism) as well as how the heart and brain function. These hormones also play a role in keeping your bones strong. When the thyroid is underactive, it produces too little of the hormones thyroxine (T4) and triiodothyronine (T3). What are the causes? This condition may be caused by:  Hashimoto's disease. This is a disease in which the body's disease-fighting system (immune system) attacks the thyroid gland. This is the most common cause.  Viral infections.  Pregnancy.  Certain medicines.  Birth defects.  Past radiation treatments to the head or neck for cancer.  Past treatment with radioactive iodine.  Past exposure to radiation in the environment.  Past surgical removal of part or all of the thyroid.  Problems with a gland in the center of the brain (pituitary gland).  Lack of enough iodine in the diet. What increases the risk? You are more likely to develop this condition if:  You are male.  You have a family history of thyroid conditions.  You use a medicine called lithium.  You take medicines that affect the immune system (immunosuppressants). What are the signs or symptoms? Symptoms of this condition include:  Feeling as though you have no energy (lethargy).  Not being able to tolerate cold.  Weight gain that is not explained by a change in diet or exercise habits.  Lack of appetite.  Dry skin.  Coarse hair.  Menstrual irregularity.  Slowing of thought processes.  Constipation.  Sadness or depression. How is this diagnosed? This condition may be diagnosed based on:  Your symptoms, your medical history, and a physical exam.  Blood  tests. You may also have imaging tests, such as an ultrasound or MRI. How is this treated? This condition is treated with medicine that replaces the thyroid hormones that your body does not make. After you begin treatment, it may take several weeks for symptoms to go away. Follow these instructions at home:  Take over-the-counter and prescription medicines only as told by your health care provider.  If you start taking any new medicines, tell your health care provider.  Keep all follow-up visits as told by your health care provider. This is important. ? As your condition improves, your dosage of thyroid hormone medicine may change. ? You will need to have blood tests regularly so that your health care provider can monitor your condition. Contact a health care provider if:  Your symptoms do not get better with treatment.  You are taking thyroid replacement medicine and you: ? Sweat a lot. ? Have tremors. ? Feel anxious. ? Lose weight rapidly. ? Cannot tolerate heat. ? Have emotional swings. ? Have diarrhea. ? Feel weak. Get help right away if you have:  Chest pain.  An irregular heartbeat.  A rapid heartbeat.  Difficulty breathing. Summary  Hypothyroidism is when the thyroid gland does not make enough of certain hormones (it is underactive).  When the thyroid is underactive, it produces too little of the hormones thyroxine (T4) and triiodothyronine (T3).  The most common cause is Hashimoto's disease, a disease in which the body's disease-fighting system (immune system) attacks the thyroid gland. The condition can also be caused by viral infections, medicine, pregnancy, or past   radiation treatment to the head or neck.  Symptoms may include weight gain, dry skin, constipation, feeling as though you do not have energy, and not being able to tolerate cold.  This condition is treated with medicine to replace the thyroid hormones that your body does not make. This information  is not intended to replace advice given to you by your health care provider. Make sure you discuss any questions you have with your health care provider. Document Released: 03/15/2005 Document Revised: 02/25/2017 Document Reviewed: 02/23/2017 Elsevier Patient Education  2020 [employer]  

## 2018-10-26 ENCOUNTER — Encounter: Payer: Self-pay | Admitting: Internal Medicine

## 2018-11-16 ENCOUNTER — Other Ambulatory Visit: Payer: Self-pay | Admitting: Cardiology

## 2018-11-16 DIAGNOSIS — E785 Hyperlipidemia, unspecified: Secondary | ICD-10-CM

## 2018-11-23 ENCOUNTER — Ambulatory Visit (INDEPENDENT_AMBULATORY_CARE_PROVIDER_SITE_OTHER): Payer: Medicare Other | Admitting: Internal Medicine

## 2018-11-23 ENCOUNTER — Other Ambulatory Visit: Payer: Self-pay

## 2018-11-23 ENCOUNTER — Encounter: Payer: Self-pay | Admitting: Internal Medicine

## 2018-11-23 ENCOUNTER — Ambulatory Visit (INDEPENDENT_AMBULATORY_CARE_PROVIDER_SITE_OTHER)
Admission: RE | Admit: 2018-11-23 | Discharge: 2018-11-23 | Disposition: A | Discharge status: Home / Self Care | Payer: Medicare Other | Source: Ambulatory Visit | Attending: Internal Medicine | Admitting: Internal Medicine

## 2018-11-23 VITALS — BP 146/74 | HR 78 | Temp 98.2°F | Resp 16 | Ht 68.0 in | Wt 133.0 lb

## 2018-11-23 DIAGNOSIS — M79661 Pain in right lower leg: Secondary | ICD-10-CM | POA: Diagnosis not present

## 2018-11-23 DIAGNOSIS — M7989 Other specified soft tissue disorders: Secondary | ICD-10-CM

## 2018-11-23 NOTE — Progress Notes (Signed)
Subjective:  Patient ID: Roy Long, male    DOB: 1950-06-09  Age: 68 y.o. MRN: [id number]  CC: Leg Swelling   HPI Roy Long presents for f/up - He complains of a localized area of swelling and pain in his right lower leg just above the ankle on the anterior medial surface.  He noticed the symptoms after he had been working in a cactus garden but he does not recall any specific trauma or injury.  Outpatient Medications Prior to Visit  Medication Sig Dispense Refill  . acyclovir (ZOVIRAX) 400 MG tablet Take 1 tablet (400 mg total) by mouth 3 (three) times daily. 270 tablet 1  . apixaban (ELIQUIS) 5 MG TABS tablet Take 1 tablet (5 mg total) by mouth 2 (two) times daily. 180 tablet 2  . atorvastatin (LIPITOR) 80 MG tablet TAKE 1 TABLET BY MOUTH EVERY DAY NEED OFFICE VISIT 30 tablet 0  . cholecalciferol (VITAMIN D) 1000 UNITS tablet Take 1,000 Units by mouth daily.    . cycloSPORINE (RESTASIS) 0.05 % ophthalmic emulsion Place 1 drop into both eyes 2 (two) times daily.    Marland Kitchen diltiazem (CARDIZEM CD) 240 MG 24 hr capsule Take 1 capsule (240 mg total) by mouth daily. 90 capsule 0  . finasteride (PROSCAR) 5 MG tablet TAKE 1 TABLET BY MOUTH DAILY 90 tablet 1  . fluticasone (FLONASE) 50 MCG/ACT nasal spray Place 2 sprays into both nostrils daily. 48 g 1  . hydrALAZINE (APRESOLINE) 50 MG tablet TAKE 1 TABLET BY MOUTH THREE TIMES A DAY 270 tablet 1  . levothyroxine (SYNTHROID) 25 MCG tablet TAKE 1 TABLET BY MOUTH DAILY BEFORE BREAKFAST. 90 tablet 1  . montelukast (SINGULAIR) 10 MG tablet Take 1 tablet (10 mg total) by mouth at bedtime. 90 tablet 1  . Multiple Vitamins-Minerals (PRESERVISION AREDS PO) Take 1 tablet by mouth daily.    Marland Kitchen omeprazole (PRILOSEC) 40 MG capsule TAKE 1 CAPSULE BY MOUTH EVERY DAY 90 capsule 1  . tamsulosin (FLOMAX) 0.4 MG CAPS capsule TAKE 1 CAPSULE (0.4 MG TOTAL) BY MOUTH DAILY. 90 capsule 3  . TRINTELLIX 10 MG TABS tablet TAKE 1 TABLET BY MOUTH EVERY DAY 90 tablet  1   No facility-administered medications prior to visit.     ROS Review of Systems  Constitutional: Negative.  Negative for diaphoresis and fatigue.  HENT: Negative.   Eyes: Negative for visual disturbance.  Respiratory: Negative for cough, chest tightness, shortness of breath and wheezing.   Cardiovascular: Positive for leg swelling. Negative for chest pain and palpitations.  Gastrointestinal: Negative for abdominal pain, constipation, diarrhea, nausea and vomiting.  Endocrine: Negative.   Genitourinary: Negative.  Negative for difficulty urinating.  Musculoskeletal: Negative.  Negative for arthralgias and myalgias.  Skin: Negative for color change, pallor, rash and wound.  Neurological: Negative.  Negative for dizziness and light-headedness.  Hematological: Negative for adenopathy. Does not bruise/bleed easily.  Psychiatric/Behavioral: Negative.     Objective:  BP (!) 146/74 (BP Location: Left Arm, Patient Position: Sitting, Cuff Size: Normal)   Pulse 78   Temp 98.2 F (36.8 C) (Oral)   Resp 16   Ht 5\' 8"  (1.727 m)   Wt 133 lb (60.3 kg)   SpO2 97%   BMI 20.22 kg/m   BP Readings from Last 3 Encounters:  11/23/18 (!) 146/74  10/24/18 140/68  04/25/18 (!) 142/70    Wt Readings from Last 3 Encounters:  11/23/18 133 lb (60.3 kg)  10/24/18 133 lb 8 oz (  60.6 kg)  04/25/18 132 lb (59.9 kg)    Physical Exam Vitals signs reviewed.  HENT:     Nose: Nose normal.     Mouth/Throat:     Mouth: Mucous membranes are moist.     Pharynx: Oropharynx is clear.  Eyes:     General: No scleral icterus.    Conjunctiva/sclera: Conjunctivae normal.  Neck:     Musculoskeletal: Normal range of motion. No neck rigidity or muscular tenderness.  Cardiovascular:     Rate and Rhythm: Regular rhythm.     Pulses: Normal pulses.     Heart sounds: No murmur.  Pulmonary:     Effort: Pulmonary effort is normal.     Breath sounds: No stridor. No wheezing, rhonchi or rales.  Abdominal:      General: Abdomen is flat. Bowel sounds are normal. There is no distension.     Palpations: There is no hepatomegaly or splenomegaly.     Tenderness: There is no abdominal tenderness.  Musculoskeletal: Normal range of motion.       Legs:  Lymphadenopathy:     Cervical: No cervical adenopathy.  Skin:    General: Skin is warm and dry.  Neurological:     General: No focal deficit present.     Lab Results  Component Value Date   WBC 7.0 10/24/2018   HGB 14.7 10/24/2018   HCT 43.5 10/24/2018   PLT 210.0 10/24/2018   GLUCOSE 94 10/24/2018   CHOL 106 04/25/2018   TRIG 97.0 04/25/2018   HDL 38.90 (L) 04/25/2018   LDLCALC 47 04/25/2018   ALT 15 04/25/2018   AST 16 04/25/2018   NA 136 10/24/2018   K 3.8 10/24/2018   CL 103 10/24/2018   CREATININE 0.86 10/24/2018   BUN 15 10/24/2018   CO2 25 10/24/2018   TSH 3.94 10/24/2018   PSA 0.15 04/25/2018   INR 1.0 04/15/2011   HGBA1C 5.1 04/15/2011    Dg Tibia/fibula Right  Result Date: 11/23/2018 CLINICAL DATA:  Pain and swelling for 1 day, no known injury. EXAM: RIGHT TIBIA AND FIBULA - 2 VIEW COMPARISON:  None. FINDINGS: Mild soft tissue edema is noted along the medial aspect of the distal leg. No subjacent osseous abnormality. No subcutaneous gas or foreign bodies. The tibia and fibula are intact. Alignment at the knee and ankle is grossly preserved though incompletely evaluated on these nondedicated radiographs IMPRESSION: Mild soft tissue edema along the medial aspect of the distal leg. No gas or foreign body. No acute osseous abnormality. Electronically Signed   By: Lovena Le M.D.   On: 11/23/2018 16:30    Assessment & Plan:   Lige was seen today for leg swelling.  Diagnoses and all orders for this visit:  Pain and swelling of lower leg, right- He has a paucity of symptoms.  Examination is positive for localized subcutaneous swelling.  Plain films are positive for mild soft tissue edema but no gas, foreign body, or osseous  lesions.  I think he has sustained an insect sting.  I recommended a course of steroids but he is not willing to take the risk of taking steroids and the potential side effects.  He will let me know if he develops any new or worsening symptoms.  I have recommended that he started taking 5 mg of Xyzal each day. -     DG Tibia/Fibula Right; Future   I am having Tregan L. Privott maintain his cycloSPORINE, cholecalciferol, acyclovir, tamsulosin, Multiple Vitamins-Minerals (PRESERVISION AREDS PO),  apixaban, finasteride, Trintellix, hydrALAZINE, fluticasone, montelukast, omeprazole, diltiazem, levothyroxine, and atorvastatin.  No orders of the defined types were placed in this encounter.    Follow-up: No follow-ups on file.  [name], MD

## 2018-11-26 ENCOUNTER — Encounter: Payer: Self-pay | Admitting: Internal Medicine

## 2018-11-26 NOTE — Patient Instructions (Signed)
Insect Bite, Adult An insect bite can make your skin red, itchy, and swollen. An insect bite is different from an insect sting, which happens when an insect injects poison (venom) into the skin. Some insects can spread disease to people through a bite. However, most insect bites do not lead to disease and are not serious. What are the causes? Insects may bite for a variety of reasons, including:  Hunger.  To defend themselves. Insects that bite include:  Spiders.  Mosquitoes.  Ticks.  Fleas.  Ants.  Flies.  Kissing bugs.  Chiggers. What are the signs or symptoms? Symptoms of this condition include:  Itching or pain in the bite area.  Redness and swelling in the bite area.  An open wound (skin ulcer). In many cases, symptoms last for 2-4 days. In rare cases, a person may have a severe allergic reaction (anaphylactic reaction) to a bite. Symptoms of an anaphylactic reaction may include:  Feeling warm in the face (flushed). This may include redness.  Itchy, red, swollen areas of skin (hives).  Swelling of the eyes, lips, face, mouth, tongue, or throat.  Difficulty breathing, speaking, or swallowing.  Noisy breathing (wheezing).  Dizziness or light-headedness.  Fainting.  Pain or cramping in the abdomen.  Vomiting.  Diarrhea. How is this diagnosed? This condition is usually diagnosed based on symptoms and a physical exam. How is this treated? Treatment is usually not needed. Symptoms often go away on their own. When treatment is recommended, it may involve:  Applying a cream or lotion to the bite area. This treatment helps with itching.  Taking an antibiotic medicine. This treatment is needed if the bite area gets infected.  Getting a tetanus shot, if you are not up to date on this vaccine.  Applying ice to the affected area.  Allergy medicines called antihistamines. This treatment may be needed if you develop itching or an allergic reaction to the  insect bite.  Giving yourself an epinephrine injection if you have an anaphylactic reaction to a bite. To give the injection, you will use what is commonly called an auto-injector "pen" (pre-filled automatic epinephrine injection device). Your health care provider will teach you how to use an auto-injector pen. Follow these instructions at home: Bite area care   Do not scratch the bite area.  Keep the bite area clean and dry. Wash it every day with soap and water as told by your health care provider.  Check the bite area every day for signs of infection. Check for: ? Redness, swelling, or pain. ? Fluid or blood. ? Warmth. ? Pus or a bad smell. Managing pain, itching, and swelling   You may apply cortisone cream, calamine lotion, or a paste made of baking soda and water to the bite area as told by your health care provider.  If directed, put ice on the bite area. ? Put ice in a plastic bag. ? Place a towel between your skin and the bag. ? Leave the ice on for 20 minutes, 2-3 times a day. General instructions  Apply or take over-the-counter and prescription medicines only as told by your health care provider.  If you were prescribed an antibiotic medicine, take or apply it as told by your health care provider. Do not stop using the antibiotic even if your condition improves.  Keep all follow-up visits as told by your health care provider. This is important. How is this prevented? To help reduce your risk of insect bites:  When you are outdoors,   wear clothing that covers your arms and legs. This is especially important in the early morning and evening.  Use insect repellent. The best insect repellents contain DEET, picaridin, oil of lemon eucalyptus (OLE), or IR3535.  Consider spraying your clothing with a pesticide called permethrin. Permethrin helps prevent insect bites. It works for several weeks and for up to 5-6 clothing washes. Do not apply permethrin directly to the skin.   If your home windows do not have screens, consider installing them.  If you will be sleeping in an area where there are mosquitoes, consider covering your sleeping area with a mosquito net. Contact a health care provider if:  You have redness, swelling, or pain in the bite area.  You have fluid or blood coming from the bite area.  The bite area feels warm to the touch.  You have pus or a bad smell coming from the bite area.  You have a fever. Get help right away if:  You have joint pain.  You have a rash.  You feel unusually tired or sleepy.  You have neck pain.  You have a headache.  You have unusual weakness.  You develop symptoms of an anaphylactic reaction. These may include: ? Flushed skin. ? Hives. ? Swelling of the eyes, lips, face, mouth, tongue, or throat. ? Difficulty breathing, speaking, or swallowing. ? Wheezing. ? Dizziness or light-headedness. ? Fainting. ? Pain or cramping in the abdomen. ? Vomiting. ? Diarrhea. These symptoms may represent a serious problem that is an emergency. Do not wait to see if the symptoms will go away. Do the following right away:  Use the auto-injector pen as you have been instructed.  Get medical help. Call your local emergency services (911 in the [place] Do not drive yourself to the hospital. Summary  An insect bite can make your skin red, itchy, and swollen.  Treatment is usually not needed. Symptoms often go away on their own. When treatment is recommended, it may involve taking medicine, applying medicine to the area, or applying ice.  Apply or take over-the-counter and prescription medicines only as told by your health care provider.  Use insect repellent to help prevent insect bites.  Contact a health care provider if you have any signs of infection in the bite area. This information is not intended to replace advice given to you by your health care provider. Make sure you discuss any questions you have with  your health care provider. Document Released: 04/22/2004 Document Revised: 09/23/2017 Document Reviewed: 09/23/2017 Elsevier Patient Education  2020 [employer].  

## 2018-11-27 ENCOUNTER — Encounter: Payer: Self-pay | Admitting: Internal Medicine

## 2018-11-27 ENCOUNTER — Telehealth: Payer: Self-pay

## 2018-11-27 NOTE — Telephone Encounter (Signed)
Pt is going to attempt to send pics via mychart.

## 2018-11-27 NOTE — Telephone Encounter (Signed)
Can he send a picture?  TJ

## 2018-11-27 NOTE — Telephone Encounter (Signed)
Copied from [place] [phone]. Topic: General - Other >> Nov 27, 2018  8:19 AM Roy Long wrote: Reason for CRM: Patient called to inform Dr Ronnald Ramp that the place on his leg that he was seen for on 11/23/2018 is getting worst and that the discoloration is spreading. Please advise. Patient can be reached at Ph# [phone]

## 2018-11-27 NOTE — Telephone Encounter (Signed)
Contacted pt, he states that the area is now red and grey around the perimeter. There is a new area that is discolored below the area of concern. The area feels harder than it did originally. There is no drainage. The pain while walking has become worse.   Please advise

## 2018-12-02 ENCOUNTER — Other Ambulatory Visit: Payer: Self-pay

## 2018-12-02 ENCOUNTER — Encounter (HOSPITAL_COMMUNITY): Payer: Self-pay | Admitting: Emergency Medicine

## 2018-12-02 ENCOUNTER — Ambulatory Visit (HOSPITAL_COMMUNITY)
Admission: EM | Admit: 2018-12-02 | Discharge: 2018-12-02 | Disposition: A | Discharge status: Home / Self Care | Payer: Medicare Other | Attending: Radiology | Admitting: Radiology

## 2018-12-02 DIAGNOSIS — R21 Rash and other nonspecific skin eruption: Secondary | ICD-10-CM

## 2018-12-02 MED ORDER — CEPHALEXIN 500 MG PO CAPS: 500.0000 mg | ORAL_CAPSULE | Freq: Four times a day (QID) | ORAL | 0 refills | Status: DC

## 2018-12-02 MED ORDER — SULFAMETHOXAZOLE-TRIMETHOPRIM 800-160 MG PO TABS: 1.0000 | ORAL_TABLET | Freq: Two times a day (BID) | ORAL | 0 refills | Status: DC

## 2018-12-02 NOTE — ED Triage Notes (Signed)
Few days ago PT was working in his cactus garden and developed a area of pain and swelling over right lower leg when he undressed. PT has redness and swelling to right lower leg and swelling and bruising around ankle. No known injury. Redness has worsened. Saw PCP and got xrays, was not started on meds.

## 2018-12-02 NOTE — ED Provider Notes (Addendum)
[facility]    CSN: [id number] Arrival date & time: 12/02/18  1145      History   Chief Complaint Chief Complaint  Patient presents with  . Appointment  . Rash  . Joint Swelling    HPI Roy Long is a 68 y.o. male.   68 y.o. male presents with right lower leg pain and erythema X 10 days. Patient states that initially he thought he had a cactus needle stuck in his leg from his cactus garden but was seen by his PMD who thought it was an insect bite xrays WNL no medications prescribed at that time. Patient states that condition is persistent and worsening since that time.  Condition is acute in nature. Condition is made better by nothing. Condition is made worse by weight bearing activity that patient describes as radiates the pain toward the foot. . Patient denies any releif from ice prior to there arrival at this facility. Pedal pulses intact. Bruising noted to arch and plantar aspect of foot.. patient is on eliquis      Past Medical History:  Diagnosis Date  . Age-related macular degeneration, dry, left eye   . Alcohol dependence ([place])   . Arthritis    "maybe a little bit in my left knee and right forefinger" (05/30/2015)  . Atrial fibrillation ([place])    ablation 05-2015 with no reoccurance as of 09-2015  . Basal cell carcinoma   . Depression    hx  . Family history of adverse reaction to anesthesia    "father had head injuries and dementia; everytime he was put under less of his mentation came back"  . Gastroparesis   . GERD (gastroesophageal reflux disease)   . History of kidney stones   . Hypercholesterolemia dx'd 02/2015  . Hypertension   . Hypothyroidism   . Kidney stones   . Migraine aura without headache    "very rare now" (05/30/2015)  . Pneumonia ~ 2004  . Squamous carcinoma    hand, chest    Patient Active Problem List   Diagnosis Date Noted  . Pain and swelling of lower leg, right 11/23/2018  . DOE (dyspnea on exertion) 10/24/2018  .  Seasonal allergic rhinitis due to pollen 10/24/2018  . Essential hypertension 04/25/2018  . Laryngopharyngeal reflux (LPR) 02/07/2018  . Kidney stone on left side 03/30/2016  . IBS (irritable bowel syndrome) 06/27/2015  . Coronary artery disease involving native coronary artery of native heart without angina pectoris 06/26/2015  . OAB (overactive bladder) 04/16/2015  . Atrial flutter ([place])   . Hyperlipidemia with target LDL less than 70 01/18/2014  . Depression with anxiety 10/24/2013  . BPH (benign prostatic hyperplasia) 04/23/2011  . Hypothyroidism 04/15/2011  . Routine general medical examination at a health care facility 04/15/2011  . Atrial fibrillation ([place])   . Hypertension     Past Surgical History:  Procedure Laterality Date  . ANTERIOR CERVICAL DECOMP/DISCECTOMY FUSION  04/2009  . ATRIAL FIBRILLATION ABLATION  05/30/2015  . BACK SURGERY    . BASAL CELL CARCINOMA EXCISION     chest or back or left arm  . CARDIOVERSION  06/2002  . CATARACT EXTRACTION W/ INTRAOCULAR LENS IMPLANT Left ~ 2012  . COLONOSCOPY    . COLONOSCOPY W/ POLYPECTOMY  02/2003  . CYSTOSCOPY W/ STONE MANIPULATION  1980's   "basketted it out; no stent"  . ELECTROPHYSIOLOGIC STUDY N/A 05/30/2015   Procedure: Atrial Fibrillation Ablation;  Surgeon: Will Meredith Leeds, MD;  Location: [facility];  Service: Cardiovascular;  Laterality: N/A;  . EXTRACORPOREAL SHOCK WAVE LITHOTRIPSY Left 04/12/2016   Procedure: LEFT EXTRACORPOREAL SHOCK WAVE LITHOTRIPSY (ESWL);  Surgeon: Irine Seal, MD;  Location: [facility];  Service: Urology;  Laterality: Left;  . INGUINAL HERNIA REPAIR Bilateral 2004  . INGUINAL HERNIA REPAIR Bilateral 2004  . MOHS SURGERY Right    "temple"  . SQUAMOUS CELL CARCINOMA EXCISION     chest or back or left arm       Home Medications    Prior to Admission medications   Medication Sig Start Date End Date Taking? Authorizing Provider  acyclovir (ZOVIRAX) 400 MG tablet Take 1 tablet (400 mg  total) by mouth 3 (three) times daily. 02/26/18  Yes Janith Lima, MD  apixaban (ELIQUIS) 5 MG TABS tablet Take 1 tablet (5 mg total) by mouth 2 (two) times daily. 05/24/18  Yes Camnitz, Will Hassell Done, MD  atorvastatin (LIPITOR) 80 MG tablet TAKE 1 TABLET BY MOUTH EVERY DAY NEED OFFICE VISIT 11/16/18  Yes Camnitz, Ocie Doyne, MD  cholecalciferol (VITAMIN D) 1000 UNITS tablet Take 1,000 Units by mouth daily.   Yes [provider]  cycloSPORINE (RESTASIS) 0.05 % ophthalmic emulsion Place 1 drop into both eyes 2 (two) times daily.   Yes [provider]  diltiazem (CARDIZEM CD) 240 MG 24 hr capsule Take 1 capsule (240 mg total) by mouth daily. 10/24/18  Yes Camnitz, Will Hassell Done, MD  finasteride (PROSCAR) 5 MG tablet TAKE 1 TABLET BY MOUTH DAILY 08/07/18  Yes Janith Lima, MD  fluticasone [facility]) 50 MCG/ACT nasal spray Place 2 sprays into both nostrils daily. 10/24/18  Yes Janith Lima, MD  hydrALAZINE (APRESOLINE) 50 MG tablet TAKE 1 TABLET BY MOUTH THREE TIMES A DAY 10/15/18  Yes Janith Lima, MD  levothyroxine (SYNTHROID) 25 MCG tablet TAKE 1 TABLET BY MOUTH DAILY BEFORE BREAKFAST. 10/24/18  Yes Janith Lima, MD  montelukast (SINGULAIR) 10 MG tablet Take 1 tablet (10 mg total) by mouth at bedtime. 10/24/18  Yes Janith Lima, MD  Multiple Vitamins-Minerals (PRESERVISION AREDS PO) Take 1 tablet by mouth daily.   Yes [provider]  omeprazole (PRILOSEC) 40 MG capsule TAKE 1 CAPSULE BY MOUTH EVERY DAY 10/24/18  Yes Janith Lima, MD  tamsulosin (FLOMAX) 0.4 MG CAPS capsule TAKE 1 CAPSULE (0.4 MG TOTAL) BY MOUTH DAILY. 03/31/18  Yes Janith Lima, MD  TRINTELLIX 10 MG TABS tablet TAKE 1 TABLET BY MOUTH EVERY DAY 10/05/18  Yes Janith Lima, MD    Family History Family History  Problem Relation Age of Onset  . Stroke Brother   . Cancer Brother        basosquamous cell carcinoma-head  . Heart failure Mother   . Colon cancer Cousin 56  . Heart disease Neg Hx    . Hyperlipidemia Neg Hx   . Hypertension Neg Hx   . Diabetes Neg Hx     Social History Social History   Tobacco Use  . Smoking status: Former Smoker    Packs/day: 3.00    Years: 17.00    Pack years: 51.00    Types: Cigarettes    Quit date: 03/30/1983    Years since quitting: 35.7  . Smokeless tobacco: Never Used  Substance Use Topics  . Alcohol use: No    Alcohol/week: 0.0 standard drinks    Comment: 05/30/2015 "recovering alcoholic; dry date [id number]"  . Drug use: No    Types: Marijuana, LSD, Mescaline  Comment: 03/07/2015 "stopped all drugs back in the 1980's"     Allergies   Pneumococcal vaccines, Tape, and Amlodipine   Review of Systems Review of Systems  Skin: Positive for rash ( right lower leg and ankle).     Physical Exam Triage Vital Signs ED Triage Vitals  Enc Vitals Group     BP 12/02/18 1238 (!) 176/77     Pulse Rate 12/02/18 1238 85     Resp 12/02/18 1238 16     Temp 12/02/18 1238 98.3 F (36.8 C)     Temp Source 12/02/18 1238 Oral     SpO2 12/02/18 1238 97 %     Weight --      Height --      Head Circumference --      Peak Flow --      Pain Score 12/02/18 1236 6     Pain Loc --      Pain Edu? --      Excl. in [place]? --    No data found.  Updated Vital Signs BP (!) 176/77   Pulse 85   Temp 98.3 F (36.8 C) (Oral)   Resp 16   SpO2 97%   Visual Acuity Right Eye Distance:   Left Eye Distance:   Bilateral Distance:    Right Eye Near:   Left Eye Near:    Bilateral Near:     Physical Exam Vitals signs and nursing note reviewed.  Constitutional:      Appearance: He is well-developed.  HENT:     Head: Normocephalic.  Neck:     Musculoskeletal: Normal range of motion.  Pulmonary:     Effort: Pulmonary effort is normal.  Musculoskeletal: Normal range of motion.        General: Swelling and tenderness present.     Right lower leg: Edema present.     Comments: erythema warm and tender to anterior medial aspct of right ankle. Not  circumventional.   Skin:    General: Skin is dry.  Neurological:     Mental Status: He is alert and oriented to person, place, and time.      UC Treatments / Results  Labs (all labs ordered are listed, but only abnormal results are displayed) Labs Reviewed - No data to display  EKG   Radiology No results found.  Procedures Procedures (including critical care time)  Medications Ordered in UC Medications - No data to display  Initial Impression / Assessment and Plan / UC Course  I have reviewed the triage vital signs and the nursing notes.  Pertinent labs & imaging results that were available during my care of the patient were reviewed by me and considered in my medical decision making (see chart for details).      Final Clinical Impressions(s) / UC Diagnoses   Final diagnoses:  None   Discharge Instructions   None    ED Prescriptions    None     Controlled Substance Prescriptions [place] Controlled Substance Registry consulted? Not Applicable   Jacqualine Mau, NP 12/02/18 1304    Jacqualine Mau, NP 12/02/18 1432

## 2018-12-02 NOTE — Discharge Instructions (Addendum)
Area has been marked please return in 24 hours if the area is worsening or no improvement is noted. Should the redness go all the way around your ankle or fevers start please go to nearest ED.

## 2018-12-07 ENCOUNTER — Other Ambulatory Visit: Payer: Self-pay

## 2018-12-07 ENCOUNTER — Encounter: Payer: Self-pay | Admitting: Internal Medicine

## 2018-12-07 ENCOUNTER — Other Ambulatory Visit: Payer: Medicare Other

## 2018-12-07 ENCOUNTER — Ambulatory Visit (INDEPENDENT_AMBULATORY_CARE_PROVIDER_SITE_OTHER): Payer: Medicare Other | Admitting: Internal Medicine

## 2018-12-07 VITALS — BP 158/58 | HR 88 | Temp 98.0°F | Resp 16 | Ht 68.0 in | Wt 133.0 lb

## 2018-12-07 DIAGNOSIS — S8011XA Contusion of right lower leg, initial encounter: Secondary | ICD-10-CM | POA: Diagnosis not present

## 2018-12-07 DIAGNOSIS — L02415 Cutaneous abscess of right lower limb: Secondary | ICD-10-CM | POA: Diagnosis not present

## 2018-12-07 NOTE — Progress Notes (Signed)
Subjective:  Patient ID: Roy Long, male    DOB: October 22, 1950  Age: 68 y.o. MRN: [id number]  CC: Recurrent Skin Infections   HPI Roy Long presents for f/up -  He remains concerned about an area on his RLE.  After I last saw him he noticed worsening pain, bruising, and swelling so he went to an urgent care center.  They thought he had an abscess/cellulitis so he is completing a course of Keflex and Bactrim DS.  He tells me the area looks better but he continues to complain about swelling and discomfort.  Outpatient Medications Prior to Visit  Medication Sig Dispense Refill  . acyclovir (ZOVIRAX) 400 MG tablet Take 1 tablet (400 mg total) by mouth 3 (three) times daily. 270 tablet 1  . apixaban (ELIQUIS) 5 MG TABS tablet Take 1 tablet (5 mg total) by mouth 2 (two) times daily. 180 tablet 2  . atorvastatin (LIPITOR) 80 MG tablet TAKE 1 TABLET BY MOUTH EVERY DAY NEED OFFICE VISIT 30 tablet 0  . cholecalciferol (VITAMIN D) 1000 UNITS tablet Take 1,000 Units by mouth daily.    . cycloSPORINE (RESTASIS) 0.05 % ophthalmic emulsion Place 1 drop into both eyes 2 (two) times daily.    Marland Kitchen diltiazem (CARDIZEM CD) 240 MG 24 hr capsule Take 1 capsule (240 mg total) by mouth daily. 90 capsule 0  . finasteride (PROSCAR) 5 MG tablet TAKE 1 TABLET BY MOUTH DAILY 90 tablet 1  . fluticasone (FLONASE) 50 MCG/ACT nasal spray Place 2 sprays into both nostrils daily. 48 g 1  . hydrALAZINE (APRESOLINE) 50 MG tablet TAKE 1 TABLET BY MOUTH THREE TIMES A DAY 270 tablet 1  . levothyroxine (SYNTHROID) 25 MCG tablet TAKE 1 TABLET BY MOUTH DAILY BEFORE BREAKFAST. 90 tablet 1  . montelukast (SINGULAIR) 10 MG tablet Take 1 tablet (10 mg total) by mouth at bedtime. 90 tablet 1  . Multiple Vitamins-Minerals (PRESERVISION AREDS PO) Take 1 tablet by mouth daily.    Marland Kitchen omeprazole (PRILOSEC) 40 MG capsule TAKE 1 CAPSULE BY MOUTH EVERY DAY 90 capsule 1  . tamsulosin (FLOMAX) 0.4 MG CAPS capsule TAKE 1 CAPSULE (0.4 MG  TOTAL) BY MOUTH DAILY. 90 capsule 3  . TRINTELLIX 10 MG TABS tablet TAKE 1 TABLET BY MOUTH EVERY DAY 90 tablet 1  . cephALEXin (KEFLEX) 500 MG capsule Take 1 capsule (500 mg total) by mouth 4 (four) times daily. 20 capsule 0  . sulfamethoxazole-trimethoprim (BACTRIM DS) 800-160 MG tablet Take 1 tablet by mouth 2 (two) times daily for 7 days. 14 tablet 0   No facility-administered medications prior to visit.     ROS Review of Systems  Constitutional: Negative.  Negative for chills, fatigue and fever.  HENT: Negative.   Eyes: Negative for visual disturbance.  Endocrine: Negative.   Genitourinary: Negative.  Negative for dysuria and hematuria.  Musculoskeletal: Negative.   Skin: Positive for color change. Negative for rash.  Neurological: Negative.  Negative for dizziness and weakness.  Hematological: Negative for adenopathy. Does not bruise/bleed easily.  Psychiatric/Behavioral: Negative.     Objective:  BP (!) 158/58 (BP Location: Left Arm, Patient Position: Sitting, Cuff Size: Normal)   Pulse 88   Temp 98 F (36.7 C) (Oral)   Resp 16   Ht 5\' 8"  (1.727 m)   Wt 133 lb (60.3 kg)   SpO2 97%   BMI 20.22 kg/m   BP Readings from Last 3 Encounters:  12/11/18 132/60  12/07/18 (!) 158/58  12/02/18 (!) 176/77    Wt Readings from Last 3 Encounters:  12/11/18 132 lb (59.9 kg)  12/07/18 133 lb (60.3 kg)  11/23/18 133 lb (60.3 kg)    Physical Exam Vitals signs reviewed.  Constitutional:      Appearance: He is not ill-appearing or diaphoretic.  HENT:     Nose: Nose normal.     Mouth/Throat:     Mouth: Mucous membranes are moist.     Pharynx: No oropharyngeal exudate.  Eyes:     Conjunctiva/sclera: Conjunctivae normal.  Neck:     Musculoskeletal: Normal range of motion. No neck rigidity or muscular tenderness.  Cardiovascular:     Rate and Rhythm: Normal rate and regular rhythm.     Pulses: Normal pulses.     Heart sounds: No murmur.  Pulmonary:     Effort: Pulmonary  effort is normal.     Breath sounds: No stridor. No wheezing, rhonchi or rales.  Abdominal:     General: Abdomen is flat. Bowel sounds are normal. There is no distension.     Tenderness: There is no abdominal tenderness.  Musculoskeletal:       Legs:  Neurological:     Mental Status: He is alert.     Lab Results  Component Value Date   WBC 7.0 10/24/2018   HGB 14.7 10/24/2018   HCT 43.5 10/24/2018   PLT 210.0 10/24/2018   GLUCOSE 94 10/24/2018   CHOL 106 04/25/2018   TRIG 97.0 04/25/2018   HDL 38.90 (L) 04/25/2018   LDLCALC 47 04/25/2018   ALT 15 04/25/2018   AST 16 04/25/2018   NA 136 10/24/2018   K 3.8 10/24/2018   CL 103 10/24/2018   CREATININE 0.86 10/24/2018   BUN 15 10/24/2018   CO2 25 10/24/2018   TSH 3.94 10/24/2018   PSA 0.15 04/25/2018   INR 1.0 04/15/2011   HGBA1C 5.1 04/15/2011   After informed verbal consent was obtained, using Betadine for cleansing and 1% Lidocaine (3 cc's were used) with epinephrine for anesthetic, with sterile technique a 6 mm punch incision was made and the subcutaneous area revealed a coagulum of blood consistent with a hematoma.  There was no exudate.  A culture specimen was sent.  Some of the hematoma was removed.  The cavity was packed with iodoform.  Hemostasis was obtained by pressure. The procedure was well tolerated without complications.  A dressing was applied.  Assessment & Plan:   Roy Long was seen today for recurrent skin infections.  Diagnoses and all orders for this visit:  Abscess of right lower leg- The culture is negative for white cells or bacteria.  If he had an infection here it is been adequately treated with the Bactrim and Keflex. -     WOUND CULTURE; Future  Traumatic hematoma of right lower leg, initial encounter- I think this is the primary cause for his symptoms and physical findings.  He appears to have had a minor trauma but he is anticoagulated so he developed a subsequent hematoma.  He will take out the  iodoform packing in 48 hours.  He will elevate the right lower extremity as much as possible for the next 1 to 2 weeks.  He will return in about 5 days for me to recheck this area.  He will let me know if he develops any new or worsening symptoms.   I have discontinued Paola L. Kings's sulfamethoxazole-trimethoprim and cephALEXin. I am also having him maintain his cycloSPORINE, cholecalciferol, acyclovir, tamsulosin,  Multiple Vitamins-Minerals (PRESERVISION AREDS PO), apixaban, finasteride, Trintellix, hydrALAZINE, fluticasone, montelukast, omeprazole, diltiazem, levothyroxine, and atorvastatin.  No orders of the defined types were placed in this encounter.    Follow-up: Return in about 5 days (around 12/12/2018).  [name], MD

## 2018-12-07 NOTE — Patient Instructions (Signed)
Hematoma A hematoma is a collection of blood under the skin, in an organ, in a body space, in a joint space, or in other tissue. The blood can thicken (clot) to form a lump that you can see and feel. The lump is often firm and may become sore and tender. Most hematomas get better in a few days to weeks. However, some hematomas may be serious and require medical care. Hematomas can range from very small to very large. What are the causes? This condition is caused by:  A blunt or penetrating injury.  A leakage from a blood vessel under the skin.  Some medical procedures, including surgeries, such as oral surgery, face lifts, and surgeries on the joints.  Some medical conditions that cause bleeding or bruising. There may be multiple hematomas that appear in different areas of the body. What increases the risk? You are more likely to develop this condition if:  You are an older adult.  You use blood thinners. What are the signs or symptoms?  Symptoms of this condition depend on where the hematoma is located.  Common symptoms of a hematoma that is under the skin include:  A firm lump on the body.  Pain and tenderness in the area.  Bruising. Blue, dark blue, purple-red, or yellowish skin (discoloration) may appear at the site of the hematoma if the hematoma is close to the surface of the skin. Common symptoms of a hematoma that is deep in the tissues or body spaces may be less obvious. They include:  A collection of blood in the stomach (intra-abdominal hematoma). This may cause pain in the abdomen, weakness, fainting, and shortness of breath.  A collection of blood in the head (intracranial hematoma). This may cause a headache or symptoms such as weakness, trouble speaking or understanding, or a change in consciousness. How is this diagnosed? This condition is diagnosed based on:  Your medical history.  A physical exam.  Imaging tests, such as an ultrasound or CT scan. These may  be needed if your health care provider suspects a hematoma in deeper tissues or body spaces.  Blood tests. These may be needed if your health care provider believes that the hematoma is caused by a medical condition. How is this treated? Treatment for this condition depends on the cause, size, and location of the hematoma. Treatment may include:  Doing nothing. The majority of hematomas do not need treatment as many of them go away on their own over time.  Surgery or close monitoring. This may be needed for large hematomas or hematomas that affect vital organs.  Medicines. Medicines may be given if there is an underlying medical cause for the hematoma. Follow these instructions at home: Managing pain, stiffness, and swelling   If directed, put ice on the affected area. ? Put ice in a plastic bag. ? Place a towel between your skin and the bag. ? Leave the ice on for 20 minutes, 2-3 times a day for the first couple of days.  If directed, apply heat to the affected area after applying ice for a couple of days. Use the heat source that your health care provider recommends, such as a moist heat pack or a heating pad. ? Place a towel between your skin and the heat source. ? Leave the heat on for 20-30 minutes. ? Remove the heat if your skin turns bright red. This is especially important if you are unable to feel pain, heat, or cold. You may have a greater   risk of getting burned.  Raise (elevate) the affected area above the level of your heart while you are sitting or lying down.  If told, wrap the affected area with an elastic bandage. The bandage applies pressure (compression) to the area, which may help to reduce swelling and promote healing. Do not wrap the bandage too tightly around the affected area.  If your hematoma is on a leg or foot (lower extremity) and is painful, your health care provider may recommend crutches. Use them as told by your health care provider. General instructions   Take over-the-counter and prescription medicines only as told by your health care provider.  Keep all follow-up visits as told by your health care provider. This is important. Contact a health care provider if:  You have a fever.  The swelling or discoloration gets worse.  You develop more hematomas. Get help right away if:  Your pain is worse or your pain is not controlled with medicine.  Your skin over the hematoma breaks or starts bleeding.  Your hematoma is in your chest or abdomen and you have weakness, shortness of breath, or a change in consciousness.  You have a hematoma on your scalp that is caused by a fall or injury, and you also have: ? A headache that gets worse. ? Trouble speaking or understanding speech. ? Weakness. ? Change in alertness or consciousness. Summary  A hematoma is a collection of blood under the skin, in an organ, in a body space, in a joint space, or in other tissue.  This condition usually does not need treatment because many hematomas go away on their own over time.  Large hematomas, or those that may affect vital organs, may need surgical drainage or monitoring. If the hematoma is caused by a medical condition, medicines may be prescribed.  Get help right away if your hematoma breaks or starts to bleed, you have shortness of breath, or you have a headache or trouble speaking after a fall. This information is not intended to replace advice given to you by your health care provider. Make sure you discuss any questions you have with your health care provider. Document Released: 10/28/2003 Document Revised: 08/18/2017 Document Reviewed: 08/18/2017 Elsevier Patient Education  2020 Elsevier Inc.  

## 2018-12-10 ENCOUNTER — Other Ambulatory Visit: Payer: Self-pay | Admitting: Cardiology

## 2018-12-10 DIAGNOSIS — E785 Hyperlipidemia, unspecified: Secondary | ICD-10-CM

## 2018-12-10 LAB — WOUND CULTURE
MICRO NUMBER:: 866476
RESULT:: NO GROWTH
SPECIMEN QUALITY:: ADEQUATE

## 2018-12-11 ENCOUNTER — Encounter: Payer: Self-pay | Admitting: Internal Medicine

## 2018-12-11 ENCOUNTER — Other Ambulatory Visit: Payer: Self-pay

## 2018-12-11 ENCOUNTER — Ambulatory Visit (INDEPENDENT_AMBULATORY_CARE_PROVIDER_SITE_OTHER): Payer: Medicare Other | Admitting: Internal Medicine

## 2018-12-11 VITALS — BP 132/60 | HR 77 | Temp 99.1°F | Resp 16 | Ht 68.0 in | Wt 132.0 lb

## 2018-12-11 DIAGNOSIS — S8011XA Contusion of right lower leg, initial encounter: Secondary | ICD-10-CM

## 2018-12-11 NOTE — Progress Notes (Signed)
Subjective:  Patient ID: Roy Long, male    DOB: 1950-10-01  Age: 68 y.o. MRN: [id number]  CC: Wound Check   HPI NNAEMEKA DRUM presents for f/up - He tells me the area on his right lower leg feels better.  There is less pain and swelling.  He took out the packing 2 days ago.  There has been no purulent exudate, redness, streaking, or drainage.  Outpatient Medications Prior to Visit  Medication Sig Dispense Refill  . acyclovir (ZOVIRAX) 400 MG tablet Take 1 tablet (400 mg total) by mouth 3 (three) times daily. 270 tablet 1  . apixaban (ELIQUIS) 5 MG TABS tablet Take 1 tablet (5 mg total) by mouth 2 (two) times daily. 180 tablet 2  . cholecalciferol (VITAMIN D) 1000 UNITS tablet Take 1,000 Units by mouth daily.    . cycloSPORINE (RESTASIS) 0.05 % ophthalmic emulsion Place 1 drop into both eyes 2 (two) times daily.    Marland Kitchen diltiazem (CARDIZEM CD) 240 MG 24 hr capsule Take 1 capsule (240 mg total) by mouth daily. 90 capsule 0  . finasteride (PROSCAR) 5 MG tablet TAKE 1 TABLET BY MOUTH DAILY 90 tablet 1  . fluticasone (FLONASE) 50 MCG/ACT nasal spray Place 2 sprays into both nostrils daily. 48 g 1  . hydrALAZINE (APRESOLINE) 50 MG tablet TAKE 1 TABLET BY MOUTH THREE TIMES A DAY 270 tablet 1  . levothyroxine (SYNTHROID) 25 MCG tablet TAKE 1 TABLET BY MOUTH DAILY BEFORE BREAKFAST. 90 tablet 1  . montelukast (SINGULAIR) 10 MG tablet Take 1 tablet (10 mg total) by mouth at bedtime. 90 tablet 1  . Multiple Vitamins-Minerals (PRESERVISION AREDS PO) Take 1 tablet by mouth daily.    Marland Kitchen omeprazole (PRILOSEC) 40 MG capsule TAKE 1 CAPSULE BY MOUTH EVERY DAY 90 capsule 1  . tamsulosin (FLOMAX) 0.4 MG CAPS capsule TAKE 1 CAPSULE (0.4 MG TOTAL) BY MOUTH DAILY. 90 capsule 3  . TRINTELLIX 10 MG TABS tablet TAKE 1 TABLET BY MOUTH EVERY DAY 90 tablet 1  . atorvastatin (LIPITOR) 80 MG tablet TAKE 1 TABLET BY MOUTH EVERY DAY NEED OFFICE VISIT 30 tablet 0   No facility-administered medications prior to visit.      ROS Review of Systems  All other systems reviewed and are negative.   Objective:  BP 132/60 (BP Location: Left Arm, Patient Position: Sitting, Cuff Size: Normal)   Pulse 77   Temp 99.1 F (37.3 C) (Oral)   Resp 16   Ht 5\' 8"  (1.727 m)   Wt 132 lb (59.9 kg)   SpO2 98%   BMI 20.07 kg/m   BP Readings from Last 3 Encounters:  12/11/18 132/60  12/07/18 (!) 158/58  12/02/18 (!) 176/77    Wt Readings from Last 3 Encounters:  12/11/18 132 lb (59.9 kg)  12/07/18 133 lb (60.3 kg)  11/23/18 133 lb (60.3 kg)    Physical Exam Musculoskeletal:       Legs:     Lab Results  Component Value Date   WBC 7.0 10/24/2018   HGB 14.7 10/24/2018   HCT 43.5 10/24/2018   PLT 210.0 10/24/2018   GLUCOSE 94 10/24/2018   CHOL 106 04/25/2018   TRIG 97.0 04/25/2018   HDL 38.90 (L) 04/25/2018   LDLCALC 47 04/25/2018   ALT 15 04/25/2018   AST 16 04/25/2018   NA 136 10/24/2018   K 3.8 10/24/2018   CL 103 10/24/2018   CREATININE 0.86 10/24/2018   BUN 15 10/24/2018   CO2  25 10/24/2018   TSH 3.94 10/24/2018   PSA 0.15 04/25/2018   INR 1.0 04/15/2011   HGBA1C 5.1 04/15/2011    No results found.  Assessment & Plan:   Esrom was seen today for wound check.  Diagnoses and all orders for this visit:  Traumatic hematoma of right lower leg, initial encounter- Improvement noted.  He was educated about local wound care.  I have asked him to rest and elevate the right lower extremity as much as possible.   I am having Richerd L. Scruton maintain his cycloSPORINE, cholecalciferol, acyclovir, tamsulosin, Multiple Vitamins-Minerals (PRESERVISION AREDS PO), apixaban, finasteride, Trintellix, hydrALAZINE, fluticasone, montelukast, omeprazole, diltiazem, and levothyroxine.  No orders of the defined types were placed in this encounter.    Follow-up: Return if symptoms worsen or fail to improve.  [name], MD

## 2018-12-11 NOTE — Patient Instructions (Signed)
Wound Care, Adult Taking care of your wound properly can help to prevent pain, infection, and scarring. It can also help your wound to heal more quickly. How to care for your wound Wound care      Follow instructions from your health care provider about how to take care of your wound. Make sure you: ? Wash your hands with soap and water before you change the bandage (dressing). If soap and water are not available, use hand sanitizer. ? Change your dressing as told by your health care provider. ? Leave stitches (sutures), skin glue, or adhesive strips in place. These skin closures may need to stay in place for 2 weeks or longer. If adhesive strip edges start to loosen and curl up, you may trim the loose edges. Do not remove adhesive strips completely unless your health care provider tells you to do that.  Check your wound area every day for signs of infection. Check for: ? Redness, swelling, or pain. ? Fluid or blood. ? Warmth. ? Pus or a bad smell.  Ask your health care provider if you should clean the wound with mild soap and water. Doing this may include: ? Using a clean towel to pat the wound dry after cleaning it. Do not rub or scrub the wound. ? Applying a cream or ointment. Do this only as told by your health care provider. ? Covering the incision with a clean dressing.  Ask your health care provider when you can leave the wound uncovered.  Keep the dressing dry until your health care provider says it can be removed. Do not take baths, swim, use a hot tub, or do anything that would put the wound underwater until your health care provider approves. Ask your health care provider if you can take showers. You may only be allowed to take sponge baths. Medicines   If you were prescribed an antibiotic medicine, cream, or ointment, take or use the antibiotic as told by your health care provider. Do not stop taking or using the antibiotic even if your condition improves.  Take  over-the-counter and prescription medicines only as told by your health care provider. If you were prescribed pain medicine, take it 30 or more minutes before you do any wound care or as told by your health care provider. General instructions  Return to your normal activities as told by your health care provider. Ask your health care provider what activities are safe.  Do not scratch or pick at the wound.  Do not use any products that contain nicotine or tobacco, such as cigarettes and e-cigarettes. These may delay wound healing. If you need help quitting, ask your health care provider.  Keep all follow-up visits as told by your health care provider. This is important.  Eat a diet that includes protein, vitamin A, vitamin C, and other nutrient-rich foods to help the wound heal. ? Foods rich in protein include meat, dairy, beans, nuts, and other sources. ? Foods rich in vitamin A include carrots and dark green, leafy vegetables. ? Foods rich in vitamin C include citrus, tomatoes, and other fruits and vegetables. ? Nutrient-rich foods have protein, carbohydrates, fat, vitamins, or minerals. Eat a variety of healthy foods including vegetables, fruits, and whole grains. Contact a health care provider if:  You received a tetanus shot and you have swelling, severe pain, redness, or bleeding at the injection site.  Your pain is not controlled with medicine.  You have redness, swelling, or pain around the wound.    You have fluid or blood coming from the wound.  Your wound feels warm to the touch.  You have pus or a bad smell coming from the wound.  You have a fever or chills.  You are nauseous or you vomit.  You are dizzy. Get help right away if:  You have a red streak going away from your wound.  The edges of the wound open up and separate.  Your wound is bleeding, and the bleeding does not stop with gentle pressure.  You have a rash.  You faint.  You have trouble breathing.  Summary  Always wash your hands with soap and water before changing your bandage (dressing).  To help with healing, eat foods that are rich in protein, vitamin A, vitamin C, and other nutrients.  Check your wound every day for signs of infection. Contact your health care provider if you suspect that your wound is infected. This information is not intended to replace advice given to you by your health care provider. Make sure you discuss any questions you have with your health care provider. Document Released: 12/23/2007 Document Revised: 07/03/2018 Document Reviewed: 09/30/2015 Elsevier Patient Education  2020 Elsevier Inc.  

## 2018-12-26 ENCOUNTER — Other Ambulatory Visit: Payer: Self-pay

## 2018-12-26 ENCOUNTER — Encounter: Payer: Self-pay | Admitting: Cardiology

## 2018-12-26 ENCOUNTER — Ambulatory Visit (INDEPENDENT_AMBULATORY_CARE_PROVIDER_SITE_OTHER): Payer: Medicare Other | Admitting: Cardiology

## 2018-12-26 DIAGNOSIS — E785 Hyperlipidemia, unspecified: Secondary | ICD-10-CM

## 2018-12-26 MED ORDER — ATORVASTATIN CALCIUM 80 MG PO TABS: ORAL_TABLET | ORAL | 3 refills | Status: DC

## 2018-12-26 NOTE — Progress Notes (Signed)
Electrophysiology Office Note   Date:  12/26/2018   ID:  Roy, Long 10-15-1950, MRN [id number]  PCP:  Janith Lima, MD  Cardiologist:  Peter Martinique Primary Electrophysiologist:  Constance Haw, MD    No chief complaint on file.    History of Present Illness: Roy Long is a 68 y.o. male who presents today for electrophysiology evaluation.   He has a history of atrial fibrillation on flecainide, hypertension, hyperlipidemia, depression. He was admitted to [facility] on 12/ 8/ 16 with palpitations noting atrial flutter with 2 to one conduction. He has atrial fibrillation because back to the early 2006 and has also had atrial flutter during that time. He underwent cardioversion in 2003 and has had one to 2 episodes of atrial fibrillation every 6 months control pill the pocket strategy with flecainide. He has had multiple episodes over the last 3-4 months occurring 4-5 times.  He had ablation for AF on 05/30/15.  Today, denies symptoms of  chest pain, shortness of breath, orthopnea, PND, lower extremity edema, claudication, dizziness, presyncope, syncope, bleeding, or neurologic sequela. The patient is tolerating medications without difficulties.  He has had a few episodes occasions, but nothing that makes him think that he is continually going into atrial fibrillation.  He has no chest pain or shortness of breath.  He did have a hematoma and infection in his leg.  He is working in his yard he does not remember hitting his leg.  It took a while to improve.  He did have to take antibiotics and have it drained.   Past Medical History:  Diagnosis Date  . Age-related macular degeneration, dry, left eye   . Alcohol dependence ([place])   . Arthritis    "maybe a little bit in my left knee and right forefinger" (05/30/2015)  . Atrial fibrillation ([place])    ablation 05-2015 with no reoccurance as of 09-2015  . Basal cell carcinoma   . Depression    hx  . Family history of adverse  reaction to anesthesia    "father had head injuries and dementia; everytime he was put under less of his mentation came back"  . Gastroparesis   . GERD (gastroesophageal reflux disease)   . History of kidney stones   . Hypercholesterolemia dx'd 02/2015  . Hypertension   . Hypothyroidism   . Kidney stones   . Migraine aura without headache    "very rare now" (05/30/2015)  . Pneumonia ~ 2004  . Squamous carcinoma    hand, chest   Past Surgical History:  Procedure Laterality Date  . ANTERIOR CERVICAL DECOMP/DISCECTOMY FUSION  04/2009  . ATRIAL FIBRILLATION ABLATION  05/30/2015  . BACK SURGERY    . BASAL CELL CARCINOMA EXCISION     chest or back or left arm  . CARDIOVERSION  06/2002  . CATARACT EXTRACTION W/ INTRAOCULAR LENS IMPLANT Left ~ 2012  . COLONOSCOPY    . COLONOSCOPY W/ POLYPECTOMY  02/2003  . CYSTOSCOPY W/ STONE MANIPULATION  1980's   "basketted it out; no stent"  . ELECTROPHYSIOLOGIC STUDY N/A 05/30/2015   Procedure: Atrial Fibrillation Ablation;  Surgeon:  Meredith Leeds, MD;  Location: [facility];  Service: Cardiovascular;  Laterality: N/A;  . EXTRACORPOREAL SHOCK WAVE LITHOTRIPSY Left 04/12/2016   Procedure: LEFT EXTRACORPOREAL SHOCK WAVE LITHOTRIPSY (ESWL);  Surgeon: Irine Seal, MD;  Location: [facility];  Service: Urology;  Laterality: Left;  . INGUINAL HERNIA REPAIR Bilateral 2004  . INGUINAL HERNIA REPAIR Bilateral  2004  . MOHS SURGERY Right    "temple"  . SQUAMOUS CELL CARCINOMA EXCISION     chest or back or left arm     Current Outpatient Medications  Medication Sig Dispense Refill  . acyclovir (ZOVIRAX) 400 MG tablet Take 1 tablet (400 mg total) by mouth 3 (three) times daily. 270 tablet 1  . apixaban (ELIQUIS) 5 MG TABS tablet Take 1 tablet (5 mg total) by mouth 2 (two) times daily. 180 tablet 2  . atorvastatin (LIPITOR) 80 MG tablet TAKE 1 TABLET BY MOUTH DAILY 90 tablet 3  . cholecalciferol (VITAMIN D) 1000 UNITS tablet Take 1,000 Units by mouth daily.     . cycloSPORINE (RESTASIS) 0.05 % ophthalmic emulsion Place 1 drop into both eyes 2 (two) times daily.    Marland Kitchen diltiazem (CARDIZEM CD) 240 MG 24 hr capsule Take 1 capsule (240 mg total) by mouth daily. 90 capsule 0  . finasteride (PROSCAR) 5 MG tablet TAKE 1 TABLET BY MOUTH DAILY 90 tablet 1  . fluticasone (FLONASE) 50 MCG/ACT nasal spray Place 2 sprays into both nostrils daily. 48 g 1  . hydrALAZINE (APRESOLINE) 50 MG tablet TAKE 1 TABLET BY MOUTH THREE TIMES A DAY 270 tablet 1  . levothyroxine (SYNTHROID) 25 MCG tablet TAKE 1 TABLET BY MOUTH DAILY BEFORE BREAKFAST. 90 tablet 1  . montelukast (SINGULAIR) 10 MG tablet Take 1 tablet (10 mg total) by mouth at bedtime. 90 tablet 1  . Multiple Vitamins-Minerals (PRESERVISION AREDS PO) Take 1 tablet by mouth daily.    Marland Kitchen omeprazole (PRILOSEC) 40 MG capsule TAKE 1 CAPSULE BY MOUTH EVERY DAY 90 capsule 1  . tamsulosin (FLOMAX) 0.4 MG CAPS capsule TAKE 1 CAPSULE (0.4 MG TOTAL) BY MOUTH DAILY. 90 capsule 3  . TRINTELLIX 10 MG TABS tablet TAKE 1 TABLET BY MOUTH EVERY DAY 90 tablet 1   No current facility-administered medications for this visit.     Allergies:   Pneumococcal vaccines, Tape, and Amlodipine   Social History:  The patient  reports that he quit smoking about 35 years ago. His smoking use included cigarettes. He has a 51.00 pack-year smoking history. He has never used smokeless tobacco. He reports that he does not drink alcohol or use drugs.   Family History:  The patient's family history includes Cancer in his brother; Colon cancer (age of onset: 9) in his cousin; Heart failure in his mother; Stroke in his brother.   ROS:  Please see the history of present illness.   Otherwise, review of systems is positive for none.   All other systems are reviewed and negative.   PHYSICAL EXAM: VS:  BP (!) 142/64   Pulse 79   Ht 5\' 8"  (1.727 m)   Wt 133 lb 6.4 oz (60.5 kg)   SpO2 98%   BMI 20.28 kg/m  , BMI Body mass index is 20.28 kg/m. GEN: Well  nourished, well developed, in no acute distress  HEENT: normal  Neck: no JVD, carotid bruits, or masses Cardiac: RRR; no murmurs, rubs, or gallops,no edema  Respiratory:  clear to auscultation bilaterally, normal work of breathing GI: soft, nontender, nondistended, + BS MS: no deformity or atrophy  Skin: warm and dry Neuro:  Strength and sensation are intact Psych: euthymic mood, full affect  EKG:  EKG is ordered today. Personal review of the ekg ordered 10/24/18 shows SR   Recent Labs: 04/25/2018: ALT 15 10/24/2018: BUN 15; Creatinine, Ser 0.86; Hemoglobin 14.7; Platelets 210.0; Potassium 3.8; Pro B Natriuretic  peptide (BNP) 18.0; Sodium 136; TSH 3.94    Lipid Panel     Component Value Date/Time   CHOL 106 04/25/2018 1048   TRIG 97.0 04/25/2018 1048   HDL 38.90 (L) 04/25/2018 1048   CHOLHDL 3 04/25/2018 1048   VLDL 19.4 04/25/2018 1048   LDLCALC 47 04/25/2018 1048     Wt Readings from Last 3 Encounters:  12/26/18 133 lb 6.4 oz (60.5 kg)  12/11/18 132 lb (59.9 kg)  12/07/18 133 lb (60.3 kg)      Other studies Reviewed: Additional studies/ records that were reviewed today include: TTE and MPI 12/12/14 Review of the above records today demonstrates:  Normal perfusion. LVEF 64% with normal wall motion. Excellent exercise tolerance. This is a low risk study.  - Left ventricle: The cavity size was normal. Systolic function was normal. The estimated ejection fraction was in the range of 60% to 65%. Wall motion was normal; there were no regional wall motion abnormalities. Doppler parameters are consistent with abnormal left ventricular relaxation (grade 1 diastolic dysfunction). - Mitral valve: Mildly thickened leaflets .   ASSESSMENT AND PLAN:  1. Persistent atrial fibrillation/atrial flutter: Status post ablation 05/30/2015.  Currently on Eliquis, diltiazem.  Has had short bursts but this has been improved with the addition of diltiazem.  He did have a hematoma,  but I do not think that this is spontaneous from his Eliquis.  No changes.  This patients CHA2DS2-VASc Score and unadjusted Ischemic Stroke Rate (% per year) is equal to 2.2 % stroke rate/year from a score of 2  Above score calculated as 1 point each if present [CHF, HTN, DM, Vascular=MI/PAD/Aortic Plaque, Age if 65-74, or Male] Above score calculated as 2 points each if present [Age > 75, or Stroke/TIA/TE]   2.  Hypertension:Elevated today.  He  check it over the next few weeks and call us back if it remains elevated.  Current medicines are reviewed at length with the patient today.   The patient has concerns regarding his medicines.  The following changes were made today: none  Labs/ tests ordered today include:  No orders of the defined types were placed in this encounter.    Disposition:   FU with   12 months.  Signed,  Meredith Leeds, MD  12/26/2018 9:50 AM     [facility] [address] [place] [address] [phone] (office) [phone] (fax)

## 2018-12-26 NOTE — Patient Instructions (Signed)
Medication Instructions:  Your physician recommends that you continue on your current medications as directed. Please refer to the Current Medication list given to you today.  If you need a refill on your cardiac medications before your next appointment, please call your pharmacy.   Labwork: None ordered  Testing/Procedures: None ordered  Follow-Up: Your physician wants you to follow-up in: 1 year with Dr. Camnitz.  You will receive a reminder letter in the mail two months in advance. If you don't receive a letter, please call our office to schedule the follow-up appointment.  Thank you for choosing [facility]!!   Kristain Filo, RN [phone]  Any Other Special Instructions Will Be Listed Below (If Applicable).        

## 2019-01-16 ENCOUNTER — Other Ambulatory Visit: Payer: Self-pay | Admitting: Cardiology

## 2019-01-17 ENCOUNTER — Other Ambulatory Visit: Payer: Self-pay | Admitting: Cardiology

## 2019-01-17 NOTE — Telephone Encounter (Signed)
Pt last saw Dr [name] 12/26/18, last labs 10/24/18 Creat 0.86, age 68, weight 60.5kg, based on specified criteria pt is on appropriate dosage of Eliquis 5mg  BID.  Will refill rx.

## 2019-01-20 ENCOUNTER — Other Ambulatory Visit: Payer: Self-pay | Admitting: Internal Medicine

## 2019-03-27 ENCOUNTER — Other Ambulatory Visit: Payer: Self-pay | Admitting: Internal Medicine

## 2019-03-27 DIAGNOSIS — I2510 Atherosclerotic heart disease of native coronary artery without angina pectoris: Secondary | ICD-10-CM

## 2019-03-27 DIAGNOSIS — I10 Essential (primary) hypertension: Secondary | ICD-10-CM

## 2019-03-27 DIAGNOSIS — A6000 Herpesviral infection of urogenital system, unspecified: Secondary | ICD-10-CM

## 2019-03-27 DIAGNOSIS — F418 Other specified anxiety disorders: Secondary | ICD-10-CM

## 2019-06-08 ENCOUNTER — Other Ambulatory Visit: Payer: Self-pay

## 2019-06-08 ENCOUNTER — Encounter: Payer: Self-pay | Admitting: Internal Medicine

## 2019-06-08 ENCOUNTER — Ambulatory Visit: Payer: Medicare PPO | Admitting: Internal Medicine

## 2019-06-08 VITALS — BP 118/68 | HR 67 | Temp 97.8°F | Ht 68.0 in | Wt 134.0 lb

## 2019-06-08 DIAGNOSIS — S61249A Puncture wound with foreign body of unspecified finger without damage to nail, initial encounter: Secondary | ICD-10-CM | POA: Insufficient documentation

## 2019-06-08 DIAGNOSIS — S6991XA Unspecified injury of right wrist, hand and finger(s), initial encounter: Secondary | ICD-10-CM

## 2019-06-08 DIAGNOSIS — S60452A Superficial foreign body of right middle finger, initial encounter: Secondary | ICD-10-CM | POA: Diagnosis not present

## 2019-06-08 NOTE — Progress Notes (Signed)
   Subjective:   Patient ID: Roy Long, male    DOB: 22-Apr-1950, 69 y.o.   MRN: [id number]  HPI The patient is a 69 YO man coming in for cactus thorn in his right middle finger. Was tending his cactus garden when he got this yesterday. Tried to remove at home unsuccessfully. Thinks there is still retained piece. This feels as if it is cutting into his joint when bending his fingers. Overnight has redness and swelling and pain.  Review of Systems  Constitutional: Negative.   HENT: Negative.   Eyes: Negative.   Respiratory: Negative for cough, chest tightness and shortness of breath.   Cardiovascular: Negative for chest pain, palpitations and leg swelling.  Gastrointestinal: Negative for abdominal distention, abdominal pain, constipation, diarrhea, nausea and vomiting.  Musculoskeletal: Positive for arthralgias, joint swelling and myalgias.  Skin: Negative.   Neurological: Negative.   Psychiatric/Behavioral: Negative.     Objective:  Physical Exam Constitutional:      Appearance: He is well-developed.  HENT:     Head: Normocephalic and atraumatic.  Cardiovascular:     Rate and Rhythm: Normal rate and regular rhythm.  Pulmonary:     Effort: Pulmonary effort is normal. No respiratory distress.     Breath sounds: Normal breath sounds. No wheezing or rales.  Abdominal:     General: Bowel sounds are normal. There is no distension.     Palpations: Abdomen is soft.     Tenderness: There is no abdominal tenderness. There is no rebound.  Musculoskeletal:        General: Tenderness present.     Cervical back: Normal range of motion.     Comments: Right middle finger with redness surrounding distal joint. Appreciate small puncture wound but unable to palpate retained fragment due to pain and swelling  Skin:    General: Skin is warm and dry.  Neurological:     Mental Status: He is alert and oriented to person, place, and time.     Coordination: Coordination normal.     Vitals:   06/08/19 1018  BP: 118/68  Pulse: 67  Temp: 97.8 F (36.6 C)  TempSrc: Oral  SpO2: 98%  Weight: 134 lb (60.8 kg)  Height: 5\' 8"  (1.727 m)    This visit occurred during the SARS-CoV-2 public health emergency.  Safety protocols were in place, including screening questions prior to the visit, additional usage of staff PPE, and extensive cleaning of exam room while observing appropriate contact time as indicated for disinfecting solutions.   Assessment & Plan:

## 2019-06-08 NOTE — Assessment & Plan Note (Signed)
Will refer to hand specialist and see if he can be seen today for retained fragment. Unable to palpate on exam and advised him that exploration so close to the joint would not be advisable except by specialist.

## 2019-06-08 NOTE — Patient Instructions (Addendum)
We will get you in with a hand specialist.

## 2019-06-11 DIAGNOSIS — S61249A Puncture wound with foreign body of unspecified finger without damage to nail, initial encounter: Secondary | ICD-10-CM | POA: Diagnosis not present

## 2019-06-11 DIAGNOSIS — S60452A Superficial foreign body of right middle finger, initial encounter: Secondary | ICD-10-CM | POA: Diagnosis not present

## 2019-06-21 ENCOUNTER — Other Ambulatory Visit: Payer: Self-pay | Admitting: Internal Medicine

## 2019-06-21 DIAGNOSIS — E039 Hypothyroidism, unspecified: Secondary | ICD-10-CM

## 2019-06-26 ENCOUNTER — Other Ambulatory Visit: Payer: Self-pay

## 2019-06-26 ENCOUNTER — Encounter: Payer: Self-pay | Admitting: Internal Medicine

## 2019-06-26 ENCOUNTER — Ambulatory Visit: Payer: Medicare PPO | Admitting: Internal Medicine

## 2019-06-26 VITALS — BP 150/60 | HR 64 | Temp 98.2°F | Resp 16 | Ht 68.0 in | Wt 134.0 lb

## 2019-06-26 DIAGNOSIS — I10 Essential (primary) hypertension: Secondary | ICD-10-CM | POA: Diagnosis not present

## 2019-06-26 DIAGNOSIS — E785 Hyperlipidemia, unspecified: Secondary | ICD-10-CM

## 2019-06-26 DIAGNOSIS — E039 Hypothyroidism, unspecified: Secondary | ICD-10-CM

## 2019-06-26 DIAGNOSIS — N400 Enlarged prostate without lower urinary tract symptoms: Secondary | ICD-10-CM

## 2019-06-26 DIAGNOSIS — Z0000 Encounter for general adult medical examination without abnormal findings: Secondary | ICD-10-CM

## 2019-06-26 LAB — CBC WITH DIFFERENTIAL/PLATELET
Basophils Absolute: 0 10*3/uL (ref 0.0–0.1)
Basophils Relative: 0.5 % (ref 0.0–3.0)
Eosinophils Absolute: 0.1 10*3/uL (ref 0.0–0.7)
Eosinophils Relative: 1.1 % (ref 0.0–5.0)
HCT: 41.6 % (ref 39.0–52.0)
Hemoglobin: 14.4 g/dL (ref 13.0–17.0)
Lymphocytes Relative: 45.6 % (ref 12.0–46.0)
Lymphs Abs: 3.2 10*3/uL (ref 0.7–4.0)
MCHC: 34.6 g/dL (ref 30.0–36.0)
MCV: 92 fl (ref 78.0–100.0)
Monocytes Absolute: 0.6 10*3/uL (ref 0.1–1.0)
Monocytes Relative: 8.6 % (ref 3.0–12.0)
Neutro Abs: 3.1 10*3/uL (ref 1.4–7.7)
Neutrophils Relative %: 44.2 % (ref 43.0–77.0)
Platelets: 229 10*3/uL (ref 150.0–400.0)
RBC: 4.52 Mil/uL (ref 4.22–5.81)
RDW: 13 % (ref 11.5–15.5)
WBC: 6.9 10*3/uL (ref 4.0–10.5)

## 2019-06-26 LAB — LIPID PANEL
Cholesterol: 97 mg/dL (ref 0–200)
HDL: 40.9 mg/dL (ref >39.00)
LDL Cholesterol: 36 mg/dL (ref 0–99)
NonHDL: 55.74
Total CHOL/HDL Ratio: 2
Triglycerides: 100 mg/dL (ref 0.0–149.0)
VLDL: 20 mg/dL (ref 0.0–40.0)

## 2019-06-26 LAB — HEPATIC FUNCTION PANEL
ALT: 16 U/L (ref 0–53)
AST: 17 U/L (ref 0–37)
Albumin: 4.6 g/dL (ref 3.5–5.2)
Alkaline Phosphatase: 77 U/L (ref 39–117)
Bilirubin, Direct: 0.1 mg/dL (ref 0.0–0.3)
Total Bilirubin: 0.5 mg/dL (ref 0.2–1.2)
Total Protein: 6.6 g/dL (ref 6.0–8.3)

## 2019-06-26 LAB — BASIC METABOLIC PANEL
BUN: 15 mg/dL (ref 6–23)
CO2: 29 mEq/L (ref 19–32)
Calcium: 9.3 mg/dL (ref 8.4–10.5)
Chloride: 104 mEq/L (ref 96–112)
Creatinine, Ser: 0.86 mg/dL (ref 0.40–1.50)
GFR: 87.69 mL/min (ref >60.00)
Glucose, Bld: 83 mg/dL (ref 70–99)
Potassium: 3.9 mEq/L (ref 3.5–5.1)
Sodium: 138 mEq/L (ref 135–145)

## 2019-06-26 LAB — PSA: PSA: 0.12 ng/mL (ref 0.10–4.00)

## 2019-06-26 LAB — TSH: TSH: 3.85 u[IU]/mL (ref 0.35–4.50)

## 2019-06-26 MED ORDER — LEVOTHYROXINE SODIUM 25 MCG PO TABS: ORAL_TABLET | ORAL | 1 refills | Status: DC

## 2019-06-26 NOTE — Patient Instructions (Signed)

## 2019-06-26 NOTE — Progress Notes (Signed)
Subjective:  Patient ID: Roy Long, male    DOB: 04-13-1950  Age: 69 y.o. MRN: [id number]  CC: Atrial Fibrillation, Hyperlipidemia, Hypothyroidism, Hypertension, and Annual Exam   HPI Roy Long presents for a CPX.  He has rare palpitations and only occur when he is at rest.  He denies any recent episodes of CP, DOE, dizziness, lightheadedness, or edema.  He feels like his thyroid dose is adequate.  Outpatient Medications Prior to Visit  Medication Sig Dispense Refill  . acyclovir (ZOVIRAX) 400 MG tablet TAKE 1 TABLET 3 TIMES A DAY 270 tablet 1  . atorvastatin (LIPITOR) 80 MG tablet TAKE 1 TABLET BY MOUTH DAILY 90 tablet 3  . cholecalciferol (VITAMIN D) 1000 UNITS tablet Take 1,000 Units by mouth daily.    . cycloSPORINE (RESTASIS) 0.05 % ophthalmic emulsion Place 1 drop into both eyes 2 (two) times daily.    Marland Kitchen diltiazem (CARDIZEM CD) 240 MG 24 hr capsule TAKE 1 CAPSULE BY MOUTH EVERY DAY 90 capsule 3  . ELIQUIS 5 MG TABS tablet TAKE 1 TABLET BY MOUTH TWICE A DAY 180 tablet 2  . finasteride (PROSCAR) 5 MG tablet TAKE 1 TABLET BY MOUTH EVERY DAY 90 tablet 1  . fluticasone (FLONASE) 50 MCG/ACT nasal spray Place 2 sprays into both nostrils daily. 48 g 1  . hydrALAZINE (APRESOLINE) 50 MG tablet TAKE 1 TABLET BY MOUTH THREE TIMES A DAY 270 tablet 1  . montelukast (SINGULAIR) 10 MG tablet Take 1 tablet (10 mg total) by mouth at bedtime. 90 tablet 1  . Multiple Vitamins-Minerals (PRESERVISION AREDS PO) Take 1 tablet by mouth daily.    Marland Kitchen omeprazole (PRILOSEC) 40 MG capsule TAKE 1 CAPSULE BY MOUTH EVERY DAY 90 capsule 1  . TRINTELLIX 10 MG TABS tablet TAKE 1 TABLET BY MOUTH EVERY DAY 90 tablet 1  . levothyroxine (SYNTHROID) 25 MCG tablet TAKE 1 TABLET BY MOUTH DAILY BEFORE BREAKFAST. 90 tablet 1  . tamsulosin (FLOMAX) 0.4 MG CAPS capsule TAKE 1 CAPSULE (0.4 MG TOTAL) BY MOUTH DAILY. 90 capsule 3   No facility-administered medications prior to visit.    ROS Review of Systems   Constitutional: Negative for appetite change, diaphoresis, fatigue and unexpected weight change.  HENT: Negative.   Eyes: Negative for visual disturbance.  Respiratory: Negative for cough, chest tightness, shortness of breath and wheezing.   Cardiovascular: Negative for chest pain, palpitations and leg swelling.  Gastrointestinal: Negative for abdominal pain, constipation, diarrhea, nausea and vomiting.  Endocrine: Negative.  Negative for cold intolerance and heat intolerance.  Genitourinary: Negative.  Negative for difficulty urinating, discharge, scrotal swelling, testicular pain and urgency.  Musculoskeletal: Negative for arthralgias and myalgias.  Skin: Negative.   Neurological: Negative for dizziness, syncope, weakness and light-headedness.  Hematological: Negative.  Negative for adenopathy.  Psychiatric/Behavioral: Negative.     Objective:  BP (!) 150/60 (BP Location: Left Arm, Patient Position: Sitting, Cuff Size: Normal)   Pulse 64   Temp 98.2 F (36.8 C) (Oral)   Resp 16   Ht 5\' 8"  (1.727 m)   Wt 134 lb (60.8 kg)   SpO2 97%   BMI 20.37 kg/m   BP Readings from Last 3 Encounters:  06/26/19 (!) 150/60  06/08/19 118/68  12/26/18 (!) 142/64    Wt Readings from Last 3 Encounters:  06/26/19 134 lb (60.8 kg)  06/08/19 134 lb (60.8 kg)  12/26/18 133 lb 6.4 oz (60.5 kg)    Physical Exam Vitals reviewed.  Constitutional:  Appearance: Normal appearance.  HENT:     Nose: Nose normal.     Mouth/Throat:     Mouth: Mucous membranes are moist.  Eyes:     General: No scleral icterus.    Conjunctiva/sclera: Conjunctivae normal.  Cardiovascular:     Rate and Rhythm: Normal rate and regular rhythm.     Heart sounds: No murmur. No gallop.   Pulmonary:     Effort: Pulmonary effort is normal.     Breath sounds: No stridor. No wheezing or rhonchi.  Abdominal:     General: Abdomen is flat. Bowel sounds are normal. There is no distension.     Palpations: Abdomen is soft.  There is no hepatomegaly, splenomegaly or mass.     Tenderness: There is no abdominal tenderness.     Hernia: No hernia is present. There is no hernia in the left inguinal area or right inguinal area.  Genitourinary:    Pubic Area: No rash.      Penis: Normal and circumcised. No discharge, swelling or lesions.      Testes: Normal.        Right: Mass, tenderness or swelling not present.        Left: Mass, tenderness or swelling not present.     Epididymis:     Right: Normal. Not inflamed or enlarged. No mass.     Left: Normal. Not inflamed or enlarged. No mass.     Prostate: Enlarged (1+ smooth symm BPH). Not tender and no nodules present.     Rectum: Normal. Guaiac result negative. No mass, tenderness, anal fissure, external hemorrhoid or internal hemorrhoid. Normal anal tone.  Musculoskeletal:        General: Normal range of motion.     Cervical back: Neck supple.     Right lower leg: No edema.     Left lower leg: No edema.  Lymphadenopathy:     Cervical: No cervical adenopathy.     Lower Body: No right inguinal adenopathy. No left inguinal adenopathy.  Skin:    General: Skin is warm and dry.  Neurological:     General: No focal deficit present.     Mental Status: He is alert.  Psychiatric:        Mood and Affect: Mood normal.        Behavior: Behavior normal.     Lab Results  Component Value Date   WBC 6.9 06/26/2019   HGB 14.4 06/26/2019   HCT 41.6 06/26/2019   PLT 229.0 06/26/2019   GLUCOSE 83 06/26/2019   CHOL 97 06/26/2019   TRIG 100.0 06/26/2019   HDL 40.90 06/26/2019   LDLCALC 36 06/26/2019   ALT 16 06/26/2019   AST 17 06/26/2019   NA 138 06/26/2019   K 3.9 06/26/2019   CL 104 06/26/2019   CREATININE 0.86 06/26/2019   BUN 15 06/26/2019   CO2 29 06/26/2019   TSH 3.85 06/26/2019   PSA 0.12 06/26/2019   INR 1.0 04/15/2011   HGBA1C 5.1 04/15/2011    No results found.  Assessment & Plan:   Roy Long was seen today for atrial fibrillation, hyperlipidemia,  hypothyroidism, hypertension and annual exam.  Diagnoses and all orders for this visit:  Acquired hypothyroidism- His TSH is in the normal range.  He will remain on the current dose of levothyroxine. -     TSH; Future -     TSH -     levothyroxine (SYNTHROID) 25 MCG tablet; TAKE 1 TABLET BY MOUTH DAILY BEFORE BREAKFAST.  Benign prostatic hyperplasia without lower urinary tract symptoms- His PSA is low which is a reassuring sign that he does not have prostate cancer.  His symptoms are adequately well controlled and he has stopped taking the alpha-blocker.  Will continue the 5 alpha reductase inhibitor. -     PSA; Future -     PSA  Routine general medical examination at a health care facility- Exam completed, labs reviewed, vaccines reviewed and updated, colon cancer screening is up-to-date, patient education was given.  Hyperlipidemia with target LDL less than 70- He has achieved his LDL goal and is doing well on the statin. -     Lipid panel; Future -     Hepatic function panel; Future -     Hepatic function panel -     Lipid panel  Essential hypertension- His BP is well controlled. -     CBC with Differential/Platelet; Future -     Basic metabolic panel; Future -     Basic metabolic panel -     CBC with Differential/Platelet   I have discontinued Estevan L. Disch's tamsulosin. I am also having him maintain his cycloSPORINE, cholecalciferol, Multiple Vitamins-Minerals (PRESERVISION AREDS PO), fluticasone, montelukast, omeprazole, atorvastatin, diltiazem, Eliquis, finasteride, Trintellix, acyclovir, hydrALAZINE, and levothyroxine.  Meds ordered this encounter  Medications  . levothyroxine (SYNTHROID) 25 MCG tablet    Sig: TAKE 1 TABLET BY MOUTH DAILY BEFORE BREAKFAST.    Dispense:  90 tablet    Refill:  1     Follow-up: Return in about 6 months (around 12/27/2019).  [name], MD

## 2019-07-05 ENCOUNTER — Other Ambulatory Visit: Payer: Self-pay | Admitting: Internal Medicine

## 2019-07-05 DIAGNOSIS — N400 Enlarged prostate without lower urinary tract symptoms: Secondary | ICD-10-CM

## 2019-07-09 ENCOUNTER — Other Ambulatory Visit: Payer: Self-pay | Admitting: Internal Medicine

## 2019-07-09 DIAGNOSIS — N400 Enlarged prostate without lower urinary tract symptoms: Secondary | ICD-10-CM

## 2019-07-31 ENCOUNTER — Other Ambulatory Visit: Payer: Self-pay | Admitting: Internal Medicine

## 2019-08-07 ENCOUNTER — Other Ambulatory Visit: Payer: Self-pay | Admitting: Internal Medicine

## 2019-08-07 DIAGNOSIS — J301 Allergic rhinitis due to pollen: Secondary | ICD-10-CM

## 2019-08-16 DIAGNOSIS — H353131 Nonexudative age-related macular degeneration, bilateral, early dry stage: Secondary | ICD-10-CM | POA: Diagnosis not present

## 2019-08-16 DIAGNOSIS — H11153 Pinguecula, bilateral: Secondary | ICD-10-CM | POA: Diagnosis not present

## 2019-08-16 DIAGNOSIS — H02831 Dermatochalasis of right upper eyelid: Secondary | ICD-10-CM | POA: Diagnosis not present

## 2019-08-16 DIAGNOSIS — H35033 Hypertensive retinopathy, bilateral: Secondary | ICD-10-CM | POA: Diagnosis not present

## 2019-08-16 DIAGNOSIS — H18413 Arcus senilis, bilateral: Secondary | ICD-10-CM | POA: Diagnosis not present

## 2019-08-16 DIAGNOSIS — H11823 Conjunctivochalasis, bilateral: Secondary | ICD-10-CM | POA: Diagnosis not present

## 2019-08-16 DIAGNOSIS — Z961 Presence of intraocular lens: Secondary | ICD-10-CM | POA: Diagnosis not present

## 2019-08-16 DIAGNOSIS — H02834 Dermatochalasis of left upper eyelid: Secondary | ICD-10-CM | POA: Diagnosis not present

## 2019-08-16 DIAGNOSIS — H04123 Dry eye syndrome of bilateral lacrimal glands: Secondary | ICD-10-CM | POA: Diagnosis not present

## 2019-09-11 DIAGNOSIS — L821 Other seborrheic keratosis: Secondary | ICD-10-CM | POA: Diagnosis not present

## 2019-09-11 DIAGNOSIS — S80861A Insect bite (nonvenomous), right lower leg, initial encounter: Secondary | ICD-10-CM | POA: Diagnosis not present

## 2019-09-11 DIAGNOSIS — L570 Actinic keratosis: Secondary | ICD-10-CM | POA: Diagnosis not present

## 2019-09-11 DIAGNOSIS — Z85828 Personal history of other malignant neoplasm of skin: Secondary | ICD-10-CM | POA: Diagnosis not present

## 2019-09-11 DIAGNOSIS — L814 Other melanin hyperpigmentation: Secondary | ICD-10-CM | POA: Diagnosis not present

## 2019-09-12 DIAGNOSIS — R3912 Poor urinary stream: Secondary | ICD-10-CM | POA: Diagnosis not present

## 2019-09-12 DIAGNOSIS — R3121 Asymptomatic microscopic hematuria: Secondary | ICD-10-CM | POA: Diagnosis not present

## 2019-09-12 DIAGNOSIS — N200 Calculus of kidney: Secondary | ICD-10-CM | POA: Diagnosis not present

## 2019-09-18 ENCOUNTER — Other Ambulatory Visit: Payer: Self-pay | Admitting: Internal Medicine

## 2019-09-18 DIAGNOSIS — I2510 Atherosclerotic heart disease of native coronary artery without angina pectoris: Secondary | ICD-10-CM

## 2019-09-18 DIAGNOSIS — I10 Essential (primary) hypertension: Secondary | ICD-10-CM

## 2019-09-18 DIAGNOSIS — F418 Other specified anxiety disorders: Secondary | ICD-10-CM

## 2019-09-25 ENCOUNTER — Other Ambulatory Visit: Payer: Self-pay | Admitting: Internal Medicine

## 2019-09-25 DIAGNOSIS — K219 Gastro-esophageal reflux disease without esophagitis: Secondary | ICD-10-CM

## 2019-10-29 ENCOUNTER — Other Ambulatory Visit: Payer: Self-pay | Admitting: Cardiology

## 2019-10-29 NOTE — Telephone Encounter (Signed)
Age 69, weight 61kg, SCr 0.86 on 06/26/19, afib indication, last OV Sept 2020

## 2019-11-01 ENCOUNTER — Other Ambulatory Visit: Payer: Self-pay | Admitting: Family

## 2019-11-01 DIAGNOSIS — J301 Allergic rhinitis due to pollen: Secondary | ICD-10-CM

## 2019-12-18 ENCOUNTER — Other Ambulatory Visit: Payer: Self-pay | Admitting: Internal Medicine

## 2019-12-18 DIAGNOSIS — E039 Hypothyroidism, unspecified: Secondary | ICD-10-CM

## 2019-12-24 ENCOUNTER — Other Ambulatory Visit: Payer: Self-pay | Admitting: Cardiology

## 2019-12-24 DIAGNOSIS — E785 Hyperlipidemia, unspecified: Secondary | ICD-10-CM

## 2020-01-14 ENCOUNTER — Other Ambulatory Visit: Payer: Self-pay | Admitting: Internal Medicine

## 2020-01-29 ENCOUNTER — Other Ambulatory Visit: Payer: Self-pay | Admitting: Cardiology

## 2020-02-12 ENCOUNTER — Ambulatory Visit: Payer: Medicare PPO | Admitting: Cardiology

## 2020-02-12 ENCOUNTER — Other Ambulatory Visit: Payer: Self-pay

## 2020-02-12 ENCOUNTER — Encounter: Payer: Self-pay | Admitting: Cardiology

## 2020-02-12 VITALS — BP 132/66 | HR 77 | Ht 68.0 in | Wt 136.0 lb

## 2020-02-12 DIAGNOSIS — I4819 Other persistent atrial fibrillation: Secondary | ICD-10-CM

## 2020-02-12 DIAGNOSIS — I480 Paroxysmal atrial fibrillation: Secondary | ICD-10-CM | POA: Diagnosis not present

## 2020-02-12 NOTE — Patient Instructions (Signed)
Medication Instructions:  Your physician recommends that you continue on your current medications as directed. Please refer to the Current Medication list given to you today.  *If you need a refill on your cardiac medications before your next appointment, please call your pharmacy*   Lab Work: None ordered If you have labs (blood work) drawn today and your tests are completely normal, you will receive your results only by: Marland Kitchen MyChart Message (if you have MyChart) OR . A paper copy in the mail If you have any lab test that is abnormal or we need to change your treatment, we will call you to review the results.   Testing/Procedures: None ordered   Follow-Up: At [facility], you and your health needs are our priority.  As part of our continuing mission to provide you with exceptional heart care, we have created designated Provider Care Teams.  These Care Teams include your primary Cardiologist (physician) and Advanced Practice Providers (APPs -  Physician Assistants and Nurse Practitioners) who all work together to provide you with the care you need, when you need it.  Your next appointment:   6 month(s)  The format for your next appointment:   In Person  Provider:   Allegra Lai, MD    Thank you for choosing [facility]!!   Trinidad Curet, RN [phone]

## 2020-02-12 NOTE — Progress Notes (Signed)
Electrophysiology Office Note   Date:  02/12/2020   ID:  Fortino, Haag Apr 03, 1950, MRN [id number]  PCP:  Janith Lima, MD  Cardiologist:  Peter Martinique Primary Electrophysiologist:  Constance Haw, MD    No chief complaint on file.    History of Present Illness: Roy Long is a 69 y.o. male who presents today for electrophysiology evaluation.     He has a history of atrial fibrillation, hypertension, hyperlipidemia, and depression.  He was admitted to [facility] 03/06/2015 with palpitations and was noted to be in atrial flutter with 2-1 conduction.  He has atrial fibrillation diagnosed in 2006.  He is also had atrial flutter during that time.  He is status post AF ablation 05/30/2015.  Today, denies symptoms of palpitations, chest pain, shortness of breath, orthopnea, PND, lower extremity edema, claudication, dizziness, presyncope, syncope, bleeding, or neurologic sequela. The patient is tolerating medications without difficulties. Since being seen he has done well. He has noted no further episodes of atrial fibrillation. He does have mild shortness of breath when he lifts weights at the gym. He can walk up to 4 miles an hour without shortness of breath. He has not had any lower extremity edema and has not had any chest discomfort.   Past Medical History:  Diagnosis Date  . Age-related macular degeneration, dry, left eye   . Alcohol dependence ([place])   . Arthritis    "maybe a little bit in my left knee and right forefinger" (05/30/2015)  . Atrial fibrillation ([place])    ablation 05-2015 with no reoccurance as of 09-2015  . Basal cell carcinoma   . Depression    hx  . Family history of adverse reaction to anesthesia    "father had head injuries and dementia; everytime he was put under less of his mentation came back"  . Gastroparesis   . GERD (gastroesophageal reflux disease)   . History of kidney stones   . Hypercholesterolemia dx'd 02/2015  . Hypertension     . Hypothyroidism   . Kidney stones   . Migraine aura without headache    "very rare now" (05/30/2015)  . Pneumonia ~ 2004  . Squamous carcinoma    hand, chest   Past Surgical History:  Procedure Laterality Date  . ANTERIOR CERVICAL DECOMP/DISCECTOMY FUSION  04/2009  . ATRIAL FIBRILLATION ABLATION  05/30/2015  . BACK SURGERY    . BASAL CELL CARCINOMA EXCISION     chest or back or left arm  . CARDIOVERSION  06/2002  . CATARACT EXTRACTION W/ INTRAOCULAR LENS IMPLANT Left ~ 2012  . COLONOSCOPY    . COLONOSCOPY W/ POLYPECTOMY  02/2003  . CYSTOSCOPY W/ STONE MANIPULATION  1980's   "basketted it out; no stent"  . ELECTROPHYSIOLOGIC STUDY N/A 05/30/2015   Procedure: Atrial Fibrillation Ablation;  Surgeon: Davion Meara Meredith Leeds, MD;  Location: [facility];  Service: Cardiovascular;  Laterality: N/A;  . EXTRACORPOREAL SHOCK WAVE LITHOTRIPSY Left 04/12/2016   Procedure: LEFT EXTRACORPOREAL SHOCK WAVE LITHOTRIPSY (ESWL);  Surgeon: Irine Seal, MD;  Location: [facility];  Service: Urology;  Laterality: Left;  . INGUINAL HERNIA REPAIR Bilateral 2004  . INGUINAL HERNIA REPAIR Bilateral 2004  . MOHS SURGERY Right    "temple"  . SQUAMOUS CELL CARCINOMA EXCISION     chest or back or left arm     Current Outpatient Medications  Medication Sig Dispense Refill  . acyclovir (ZOVIRAX) 400 MG tablet TAKE 1 TABLET 3 TIMES A DAY  270 tablet 1  . atorvastatin (LIPITOR) 80 MG tablet TAKE 1 TABLET BY MOUTH EVERY DAY 90 tablet 3  . cholecalciferol (VITAMIN D) 1000 UNITS tablet Take 1,000 Units by mouth daily.    . cycloSPORINE (RESTASIS) 0.05 % ophthalmic emulsion Place 1 drop into both eyes 2 (two) times daily.    Marland Kitchen diltiazem (CARDIZEM CD) 240 MG 24 hr capsule TAKE 1 CAPSULE BY MOUTH EVERY DAY 30 capsule 0  . ELIQUIS 5 MG TABS tablet TAKE 1 TABLET BY MOUTH TWICE A DAY 180 tablet 1  . finasteride (PROSCAR) 5 MG tablet TAKE 1 TABLET BY MOUTH EVERY DAY 90 tablet 1  . fluticasone (FLONASE) 50 MCG/ACT nasal spray  Place 2 sprays into both nostrils daily. 48 g 1  . hydrALAZINE (APRESOLINE) 50 MG tablet TAKE 1 TABLET BY MOUTH THREE TIMES A DAY 270 tablet 1  . levothyroxine (SYNTHROID) 25 MCG tablet TAKE 1 TABLET BY MOUTH EVERY DAY BEFORE BREAKFAST 90 tablet 0  . Multiple Vitamins-Minerals (PRESERVISION AREDS PO) Take 1 tablet by mouth daily.    Marland Kitchen omeprazole (PRILOSEC) 40 MG capsule TAKE 1 CAPSULE BY MOUTH EVERY DAY 90 capsule 1  . tamsulosin (FLOMAX) 0.4 MG CAPS capsule TAKE 1 CAPSULE BY MOUTH EVERY DAY 90 capsule 1  . TRINTELLIX 10 MG TABS tablet TAKE 1 TABLET BY MOUTH EVERY DAY 90 tablet 1   No current facility-administered medications for this visit.    Allergies:   Pneumococcal vaccines, Tape, and Amlodipine   Social History:  The patient  reports that he quit smoking about 36 years ago. His smoking use included cigarettes. He has a 51.00 pack-year smoking history. He has never used smokeless tobacco. He reports that he does not drink alcohol and does not use drugs.   Family History:  The patient's family history includes Cancer in his brother; Colon cancer (age of onset: 59) in his cousin; Heart failure in his mother; Stroke in his brother.   ROS:  Please see the history of present illness.   Otherwise, review of systems is positive for none.   All other systems are reviewed and negative.   PHYSICAL EXAM: VS:  BP 132/66   Pulse 77   Ht 5\' 8"  (1.727 m)   Wt 136 lb (61.7 kg)   SpO2 96%   BMI 20.68 kg/m  , BMI Body mass index is 20.68 kg/m. GEN: Well nourished, well developed, in no acute distress  HEENT: normal  Neck: no JVD, carotid bruits, or masses Cardiac: RRR; no murmurs, rubs, or gallops,no edema  Respiratory:  clear to auscultation bilaterally, normal work of breathing GI: soft, nontender, nondistended, + BS MS: no deformity or atrophy  Skin: warm and dry Neuro:  Strength and sensation are intact Psych: euthymic mood, full affect  EKG:  EKG is ordered today. Personal review of  the ekg ordered shows sinus rhythm, rate 74   Recent Labs: 06/26/2019: ALT 16; BUN 15; Creatinine, Ser 0.86; Hemoglobin 14.4; Platelets 229.0; Potassium 3.9; Sodium 138; TSH 3.85    Lipid Panel     Component Value Date/Time   CHOL 97 06/26/2019 1443   TRIG 100.0 06/26/2019 1443   HDL 40.90 06/26/2019 1443   CHOLHDL 2 06/26/2019 1443   VLDL 20.0 06/26/2019 1443   LDLCALC 36 06/26/2019 1443     Wt Readings from Last 3 Encounters:  02/12/20 136 lb (61.7 kg)  06/26/19 134 lb (60.8 kg)  06/08/19 134 lb (60.8 kg)      Other studies  Reviewed: Additional studies/ records that were reviewed today include: TTE and MPI 12/12/14 Review of the above records today demonstrates:  Normal perfusion. LVEF 64% with normal wall motion. Excellent exercise tolerance. This is a low risk study.  - Left ventricle: The cavity size was normal. Systolic function was normal. The estimated ejection fraction was in the range of 60% to 65%. Wall motion was normal; there were no regional wall motion abnormalities. Doppler parameters are consistent with abnormal left ventricular relaxation (grade 1 diastolic dysfunction). - Mitral valve: Mildly thickened leaflets .   ASSESSMENT AND PLAN:  1. Persistent atrial fibrillation/flutter: Status post ablation 05/30/2015.  Currently on Eliquis and diltiazem.  CHA2DS2-VASc of 2. He remains in sinus rhythm. No changes.  2.  Hypertension: Currently well controlled  3. Shortness of breath: He feels well at rest and with walking, but does get short of breath when he lifts heavy weights at the gym. He had an echo in 2016 that showed an a normal ejection fraction and a Myoview in 2017 which was low risk with good exercise capacity. At this point, we'll hold off on further cardiac work-up unless his shortness of breath worsens or does not significantly improve. My index of suspicion for cardiac causes is low. This could be due to the fact that he took  approximately a year and a half off of exercising during the pandemic.  Current medicines are reviewed at length with the patient today.   The patient has concerns regarding his medicines.  The following changes were made today: None  Labs/ tests ordered today include:  Orders Placed This Encounter  Procedures  . EKG 12-Lead     Disposition:   FU with Alden Bensinger 6 months.  Signed, Lila Lufkin Meredith Leeds, MD  02/12/2020 11:52 AM     [place] [id number] [phone] (office) [phone] (fax)

## 2020-02-22 ENCOUNTER — Other Ambulatory Visit: Payer: Self-pay | Admitting: Cardiology

## 2020-03-12 DIAGNOSIS — Z85828 Personal history of other malignant neoplasm of skin: Secondary | ICD-10-CM | POA: Diagnosis not present

## 2020-03-12 DIAGNOSIS — L821 Other seborrheic keratosis: Secondary | ICD-10-CM | POA: Diagnosis not present

## 2020-03-12 DIAGNOSIS — L814 Other melanin hyperpigmentation: Secondary | ICD-10-CM | POA: Diagnosis not present

## 2020-03-12 DIAGNOSIS — L570 Actinic keratosis: Secondary | ICD-10-CM | POA: Diagnosis not present

## 2020-03-12 DIAGNOSIS — C44722 Squamous cell carcinoma of skin of right lower limb, including hip: Secondary | ICD-10-CM | POA: Diagnosis not present

## 2020-03-17 ENCOUNTER — Other Ambulatory Visit: Payer: Self-pay | Admitting: Internal Medicine

## 2020-03-17 DIAGNOSIS — K219 Gastro-esophageal reflux disease without esophagitis: Secondary | ICD-10-CM

## 2020-04-08 ENCOUNTER — Other Ambulatory Visit: Payer: Self-pay | Admitting: Internal Medicine

## 2020-04-08 DIAGNOSIS — F418 Other specified anxiety disorders: Secondary | ICD-10-CM

## 2020-04-16 ENCOUNTER — Other Ambulatory Visit: Payer: Self-pay | Admitting: Cardiology

## 2020-04-16 ENCOUNTER — Other Ambulatory Visit: Payer: Self-pay | Admitting: Family

## 2020-04-16 ENCOUNTER — Other Ambulatory Visit: Payer: Self-pay | Admitting: Internal Medicine

## 2020-04-16 DIAGNOSIS — I10 Essential (primary) hypertension: Secondary | ICD-10-CM

## 2020-04-16 DIAGNOSIS — I2510 Atherosclerotic heart disease of native coronary artery without angina pectoris: Secondary | ICD-10-CM

## 2020-04-16 DIAGNOSIS — E039 Hypothyroidism, unspecified: Secondary | ICD-10-CM

## 2020-04-16 NOTE — Telephone Encounter (Signed)
Eliquis 5mg  refill request received. Patient is 70 years old, weight-61.7kg, Crea-0.86 on 06/26/2019, Diagnosis-Afib, and last seen by Dr. [name] on 02/12/2020. Dose is appropriate based on dosing criteria. Will send in refill to requested pharmacy.

## 2020-04-18 ENCOUNTER — Encounter: Payer: Self-pay | Admitting: Internal Medicine

## 2020-04-18 ENCOUNTER — Ambulatory Visit: Payer: Medicare PPO | Admitting: Internal Medicine

## 2020-04-18 ENCOUNTER — Other Ambulatory Visit: Payer: Self-pay

## 2020-04-18 VITALS — BP 140/60 | HR 70 | Temp 98.7°F | Resp 16 | Ht 68.0 in | Wt 135.0 lb

## 2020-04-18 DIAGNOSIS — E039 Hypothyroidism, unspecified: Secondary | ICD-10-CM

## 2020-04-18 DIAGNOSIS — G5793 Unspecified mononeuropathy of bilateral lower limbs: Secondary | ICD-10-CM | POA: Insufficient documentation

## 2020-04-18 DIAGNOSIS — I10 Essential (primary) hypertension: Secondary | ICD-10-CM

## 2020-04-18 LAB — BASIC METABOLIC PANEL
BUN: 14 mg/dL (ref 6–23)
CO2: 27 mEq/L (ref 19–32)
Calcium: 9.5 mg/dL (ref 8.4–10.5)
Chloride: 102 mEq/L (ref 96–112)
Creatinine, Ser: 0.88 mg/dL (ref 0.40–1.50)
GFR: 86.4 mL/min (ref >60.00)
Glucose, Bld: 112 mg/dL — ABNORMAL HIGH (ref 70–99)
Potassium: 4.2 mEq/L (ref 3.5–5.1)
Sodium: 135 mEq/L (ref 135–145)

## 2020-04-18 LAB — CBC WITH DIFFERENTIAL/PLATELET
Basophils Absolute: 0 10*3/uL (ref 0.0–0.1)
Basophils Relative: 0.3 % (ref 0.0–3.0)
Eosinophils Absolute: 0.1 10*3/uL (ref 0.0–0.7)
Eosinophils Relative: 1.7 % (ref 0.0–5.0)
HCT: 41.8 % (ref 39.0–52.0)
Hemoglobin: 14.6 g/dL (ref 13.0–17.0)
Lymphocytes Relative: 42.3 % (ref 12.0–46.0)
Lymphs Abs: 2.9 10*3/uL (ref 0.7–4.0)
MCHC: 34.8 g/dL (ref 30.0–36.0)
MCV: 91.3 fl (ref 78.0–100.0)
Monocytes Absolute: 0.6 10*3/uL (ref 0.1–1.0)
Monocytes Relative: 8.7 % (ref 3.0–12.0)
Neutro Abs: 3.3 10*3/uL (ref 1.4–7.7)
Neutrophils Relative %: 47 % (ref 43.0–77.0)
Platelets: 226 10*3/uL (ref 150.0–400.0)
RBC: 4.58 Mil/uL (ref 4.22–5.81)
RDW: 12.8 % (ref 11.5–15.5)
WBC: 6.9 10*3/uL (ref 4.0–10.5)

## 2020-04-18 LAB — FOLATE: Folate: 19.5 ng/mL (ref >5.9)

## 2020-04-18 LAB — VITAMIN B12: Vitamin B-12: 640 pg/mL (ref 211–911)

## 2020-04-18 LAB — HEMOGLOBIN A1C: Hgb A1c MFr Bld: 5.5 % (ref 4.6–6.5)

## 2020-04-18 LAB — TSH: TSH: 3.45 u[IU]/mL (ref 0.35–4.50)

## 2020-04-18 NOTE — Patient Instructions (Signed)
Hypothyroidism  Hypothyroidism is when the thyroid gland does not make enough of certain hormones (it is underactive). The thyroid gland is a small gland located in the lower front part of the neck, just in front of the windpipe (trachea). This gland makes hormones that help control how the body uses food for energy (metabolism) as well as how the heart and brain function. These hormones also play a role in keeping your bones strong. When the thyroid is underactive, it produces too little of the hormones thyroxine (T4) and triiodothyronine (T3). What are the causes? This condition may be caused by:  Hashimoto's disease. This is a disease in which the body's disease-fighting system (immune system) attacks the thyroid gland. This is the most common cause.  Viral infections.  Pregnancy.  Certain medicines.  Birth defects.  Past radiation treatments to the head or neck for cancer.  Past treatment with radioactive iodine.  Past exposure to radiation in the environment.  Past surgical removal of part or all of the thyroid.  Problems with a gland in the center of the brain (pituitary gland).  Lack of enough iodine in the diet. What increases the risk? You are more likely to develop this condition if:  You are male.  You have a family history of thyroid conditions.  You use a medicine called lithium.  You take medicines that affect the immune system (immunosuppressants). What are the signs or symptoms? Symptoms of this condition include:  Feeling as though you have no energy (lethargy).  Not being able to tolerate cold.  Weight gain that is not explained by a change in diet or exercise habits.  Lack of appetite.  Dry skin.  Coarse hair.  Menstrual irregularity.  Slowing of thought processes.  Constipation.  Sadness or depression. How is this diagnosed? This condition may be diagnosed based on:  Your symptoms, your medical history, and a physical exam.  Blood  tests. You may also have imaging tests, such as an ultrasound or MRI. How is this treated? This condition is treated with medicine that replaces the thyroid hormones that your body does not make. After you begin treatment, it may take several weeks for symptoms to go away. Follow these instructions at home:  Take over-the-counter and prescription medicines only as told by your health care provider.  If you start taking any new medicines, tell your health care provider.  Keep all follow-up visits as told by your health care provider. This is important. ? As your condition improves, your dosage of thyroid hormone medicine may change. ? You will need to have blood tests regularly so that your health care provider can monitor your condition. Contact a health care provider if:  Your symptoms do not get better with treatment.  You are taking thyroid hormone replacement medicine and you: ? Sweat a lot. ? Have tremors. ? Feel anxious. ? Lose weight rapidly. ? Cannot tolerate heat. ? Have emotional swings. ? Have diarrhea. ? Feel weak. Get help right away if you have:  Chest pain.  An irregular heartbeat.  A rapid heartbeat.  Difficulty breathing. Summary  Hypothyroidism is when the thyroid gland does not make enough of certain hormones (it is underactive).  When the thyroid is underactive, it produces too little of the hormones thyroxine (T4) and triiodothyronine (T3).  The most common cause is Hashimoto's disease, a disease in which the body's disease-fighting system (immune system) attacks the thyroid gland. The condition can also be caused by viral infections, medicine, pregnancy, or   past radiation treatment to the head or neck.  Symptoms may include weight gain, dry skin, constipation, feeling as though you do not have energy, and not being able to tolerate cold.  This condition is treated with medicine to replace the thyroid hormones that your body does not make. This  information is not intended to replace advice given to you by your health care provider. Make sure you discuss any questions you have with your health care provider. Document Revised: 12/14/2019 Document Reviewed: 11/29/2019 Elsevier Patient Education  2021 [employer] Inc.  

## 2020-04-18 NOTE — Progress Notes (Signed)
Subjective:  Patient ID: Roy Long, male    DOB: [birth year]  Age: 70 y.o. MRN: [id number]  CC: Hypothyroidism  This visit occurred during the SARS-CoV-2 public health emergency.  Safety protocols were in place, including screening questions prior to the visit, additional usage of staff PPE, and extensive cleaning of exam room while observing appropriate contact time as indicated for disinfecting solutions.    HPI Roy Long presents for f/up -  He complains for the last year that he has had a sensation that there are socks on his feet when in fact he is not wearing socks.  There is no discomfort associated with this.  He has no paresthesias in his upper extremities and denies neck or back pain.  Outpatient Medications Prior to Visit  Medication Sig Dispense Refill  . acyclovir (ZOVIRAX) 400 MG tablet TAKE 1 TABLET 3 TIMES A DAY 270 tablet 1  . atorvastatin (LIPITOR) 80 MG tablet TAKE 1 TABLET BY MOUTH EVERY DAY 90 tablet 3  . cholecalciferol (VITAMIN D) 1000 UNITS tablet Take 1,000 Units by mouth daily.    . cycloSPORINE (RESTASIS) 0.05 % ophthalmic emulsion Place 1 drop into both eyes 2 (two) times daily.    Marland Kitchen diltiazem (CARDIZEM CD) 240 MG 24 hr capsule TAKE 1 CAPSULE BY MOUTH EVERY DAY 30 capsule 10  . ELIQUIS 5 MG TABS tablet TAKE 1 TABLET BY MOUTH TWICE A DAY 180 tablet 1  . finasteride (PROSCAR) 5 MG tablet TAKE 1 TABLET BY MOUTH EVERY DAY 90 tablet 1  . fluticasone (FLONASE) 50 MCG/ACT nasal spray Place 2 sprays into both nostrils daily. 48 g 1  . hydrALAZINE (APRESOLINE) 50 MG tablet TAKE 1 TABLET BY MOUTH THREE TIMES A DAY 270 tablet 1  . Multiple Vitamins-Minerals (PRESERVISION AREDS PO) Take 1 tablet by mouth daily.    Marland Kitchen omeprazole (PRILOSEC) 40 MG capsule TAKE 1 CAPSULE BY MOUTH EVERY DAY 90 capsule 1  . tamsulosin (FLOMAX) 0.4 MG CAPS capsule TAKE 1 CAPSULE BY MOUTH EVERY DAY 90 capsule 1  . TRINTELLIX 10 MG TABS tablet TAKE 1 TABLET BY MOUTH EVERY DAY 90 tablet 1   . levothyroxine (SYNTHROID) 25 MCG tablet TAKE 1 TABLET BY MOUTH EVERY DAY BEFORE BREAKFAST 90 tablet 0   No facility-administered medications prior to visit.    ROS Review of Systems  Constitutional: Negative for appetite change, diaphoresis, fatigue and unexpected weight change.  HENT: Negative.   Eyes: Negative for visual disturbance.  Respiratory: Negative for cough, chest tightness, shortness of breath and wheezing.   Cardiovascular: Negative for chest pain, palpitations and leg swelling.  Gastrointestinal: Negative for abdominal pain, constipation, diarrhea, nausea and vomiting.  Endocrine: Negative.  Negative for cold intolerance and heat intolerance.  Genitourinary: Negative.  Negative for difficulty urinating.  Musculoskeletal: Negative for back pain, myalgias and neck pain.  Skin: Negative.  Negative for color change.  Neurological: Negative for dizziness, weakness, light-headedness and numbness.  Hematological: Negative for adenopathy. Does not bruise/bleed easily.  Psychiatric/Behavioral: Negative.     Objective:  BP 140/60 (BP Location: Left Arm, Patient Position: Sitting, Cuff Size: Normal)   Pulse 70   Temp 98.7 F (37.1 C) (Oral)   Resp 16   Ht 5\' 8"  (1.727 m)   Wt 135 lb (61.2 kg)   SpO2 98%   BMI 20.53 kg/m   BP Readings from Last 3 Encounters:  04/18/20 140/60  02/12/20 132/66  06/26/19 (!) 150/60    Wt Readings from Last  3 Encounters:  04/18/20 135 lb (61.2 kg)  02/12/20 136 lb (61.7 kg)  06/26/19 134 lb (60.8 kg)    Physical Exam Vitals reviewed.  Constitutional:      Appearance: Normal appearance.  HENT:     Nose: Nose normal.     Mouth/Throat:     Mouth: Mucous membranes are moist.  Eyes:     General: No scleral icterus.    Extraocular Movements: Extraocular movements intact.     Conjunctiva/sclera: Conjunctivae normal.     Pupils: Pupils are equal, round, and reactive to light.  Cardiovascular:     Rate and Rhythm: Normal rate and  regular rhythm.     Heart sounds: No murmur heard.   Pulmonary:     Effort: Pulmonary effort is normal.     Breath sounds: No stridor. No wheezing, rhonchi or rales.  Abdominal:     General: Abdomen is flat. Bowel sounds are normal. There is no distension.     Palpations: Abdomen is soft. There is no hepatomegaly, splenomegaly or mass.  Musculoskeletal:        General: Normal range of motion.     Cervical back: Neck supple.     Right lower leg: No edema.     Left lower leg: No edema.  Lymphadenopathy:     Cervical: No cervical adenopathy.  Skin:    General: Skin is warm and dry.     Coloration: Skin is not pale.  Neurological:     General: No focal deficit present.     Mental Status: He is alert and oriented to person, place, and time. Mental status is at baseline.     Cranial Nerves: Cranial nerves are intact.     Sensory: Sensation is intact. No sensory deficit.     Motor: No weakness, tremor or atrophy.     Coordination: Coordination is intact. Coordination normal.     Deep Tendon Reflexes: Reflexes normal.     Reflex Scores:      Tricep reflexes are 1+ on the right side and 1+ on the left side.      Bicep reflexes are 1+ on the right side and 1+ on the left side.      Brachioradialis reflexes are 0 on the right side and 0 on the left side.      Patellar reflexes are 1+ on the right side and 1+ on the left side.      Achilles reflexes are 0 on the right side and 0 on the left side. Psychiatric:        Mood and Affect: Mood normal.        Behavior: Behavior normal.     Lab Results  Component Value Date   WBC 6.9 04/18/2020   HGB 14.6 04/18/2020   HCT 41.8 04/18/2020   PLT 226.0 04/18/2020   GLUCOSE 112 (H) 04/18/2020   CHOL 97 06/26/2019   TRIG 100.0 06/26/2019   HDL 40.90 06/26/2019   LDLCALC 36 06/26/2019   ALT 16 06/26/2019   AST 17 06/26/2019   NA 135 04/18/2020   K 4.2 04/18/2020   CL 102 04/18/2020   CREATININE 0.88 04/18/2020   BUN 14 04/18/2020    CO2 27 04/18/2020   TSH 3.45 04/18/2020   PSA 0.12 06/26/2019   INR 1.0 04/15/2011   HGBA1C 5.5 04/18/2020    No results found.  Assessment & Plan:   Nyzier was seen today for hypothyroidism.  Diagnoses and all orders for this visit:  Acquired hypothyroidism- His TSH is on the normal range. Will cont the current T4 dose. -     Cancel: TSH; Future -     TSH; Future -     TSH -     levothyroxine (SYNTHROID) 25 MCG tablet; TAKE 1 TABLET BY MOUTH EVERY DAY BEFORE BREAKFAST  Neuropathy involving both lower extremities- Will evaluate for causes of sensory neuropathy. -     Vitamin B12; Future -     Hemoglobin A1c; Future -     RPR; Future -     Folate; Future -     Vitamin B1; Future -     Vitamin B1 -     Folate -     RPR -     Hemoglobin A1c -     Vitamin B12  Essential hypertension- His BP is adequately well controlled. -     CBC with Differential/Platelet; Future -     Basic metabolic panel; Future -     Basic metabolic panel -     CBC with Differential/Platelet   I am having Day L. Flater maintain his cycloSPORINE, cholecalciferol, Multiple Vitamins-Minerals (PRESERVISION AREDS PO), fluticasone, acyclovir, tamsulosin, atorvastatin, finasteride, diltiazem, omeprazole, Trintellix, Eliquis, hydrALAZINE, and levothyroxine.  Meds ordered this encounter  Medications  . levothyroxine (SYNTHROID) 25 MCG tablet    Sig: TAKE 1 TABLET BY MOUTH EVERY DAY BEFORE BREAKFAST    Dispense:  90 tablet    Refill:  1     Follow-up: Return in about 3 months (around 07/17/2020).  [name], MD

## 2020-04-20 MED ORDER — LEVOTHYROXINE SODIUM 25 MCG PO TABS
ORAL_TABLET | ORAL | 1 refills | Status: DC
Start: 2020-04-20 — End: 2020-10-13

## 2020-04-24 LAB — VITAMIN B1: Vitamin B1 (Thiamine): 29 nmol/L (ref 8–30)

## 2020-04-24 LAB — RPR: RPR Ser Ql: NONREACTIVE

## 2020-07-13 ENCOUNTER — Other Ambulatory Visit: Payer: Self-pay | Admitting: Internal Medicine

## 2020-07-21 ENCOUNTER — Other Ambulatory Visit: Payer: Self-pay | Admitting: Internal Medicine

## 2020-07-21 DIAGNOSIS — A6000 Herpesviral infection of urogenital system, unspecified: Secondary | ICD-10-CM

## 2020-08-18 DIAGNOSIS — H02831 Dermatochalasis of right upper eyelid: Secondary | ICD-10-CM | POA: Diagnosis not present

## 2020-08-18 DIAGNOSIS — H35033 Hypertensive retinopathy, bilateral: Secondary | ICD-10-CM | POA: Diagnosis not present

## 2020-08-18 DIAGNOSIS — Z961 Presence of intraocular lens: Secondary | ICD-10-CM | POA: Diagnosis not present

## 2020-08-18 DIAGNOSIS — H16223 Keratoconjunctivitis sicca, not specified as Sjogren's, bilateral: Secondary | ICD-10-CM | POA: Diagnosis not present

## 2020-08-18 DIAGNOSIS — H353131 Nonexudative age-related macular degeneration, bilateral, early dry stage: Secondary | ICD-10-CM | POA: Diagnosis not present

## 2020-08-18 DIAGNOSIS — H02834 Dermatochalasis of left upper eyelid: Secondary | ICD-10-CM | POA: Diagnosis not present

## 2020-08-18 DIAGNOSIS — H5310 Unspecified subjective visual disturbances: Secondary | ICD-10-CM | POA: Diagnosis not present

## 2020-08-21 DIAGNOSIS — H26491 Other secondary cataract, right eye: Secondary | ICD-10-CM | POA: Diagnosis not present

## 2020-08-21 DIAGNOSIS — H02831 Dermatochalasis of right upper eyelid: Secondary | ICD-10-CM | POA: Diagnosis not present

## 2020-08-21 DIAGNOSIS — H18413 Arcus senilis, bilateral: Secondary | ICD-10-CM | POA: Diagnosis not present

## 2020-08-21 DIAGNOSIS — Z961 Presence of intraocular lens: Secondary | ICD-10-CM | POA: Diagnosis not present

## 2020-09-13 ENCOUNTER — Other Ambulatory Visit: Payer: Self-pay | Admitting: Internal Medicine

## 2020-09-13 DIAGNOSIS — K219 Gastro-esophageal reflux disease without esophagitis: Secondary | ICD-10-CM

## 2020-09-18 DIAGNOSIS — L821 Other seborrheic keratosis: Secondary | ICD-10-CM | POA: Diagnosis not present

## 2020-09-18 DIAGNOSIS — L570 Actinic keratosis: Secondary | ICD-10-CM | POA: Diagnosis not present

## 2020-09-18 DIAGNOSIS — C44519 Basal cell carcinoma of skin of other part of trunk: Secondary | ICD-10-CM | POA: Diagnosis not present

## 2020-09-18 DIAGNOSIS — C44612 Basal cell carcinoma of skin of right upper limb, including shoulder: Secondary | ICD-10-CM | POA: Diagnosis not present

## 2020-09-18 DIAGNOSIS — Z85828 Personal history of other malignant neoplasm of skin: Secondary | ICD-10-CM | POA: Diagnosis not present

## 2020-09-18 DIAGNOSIS — L814 Other melanin hyperpigmentation: Secondary | ICD-10-CM | POA: Diagnosis not present

## 2020-09-23 ENCOUNTER — Other Ambulatory Visit: Payer: Self-pay

## 2020-09-23 ENCOUNTER — Ambulatory Visit: Payer: Medicare PPO | Admitting: Cardiology

## 2020-09-23 ENCOUNTER — Encounter: Payer: Self-pay | Admitting: Cardiology

## 2020-09-23 VITALS — BP 132/60 | HR 68 | Ht 68.0 in | Wt 136.0 lb

## 2020-09-23 DIAGNOSIS — I4819 Other persistent atrial fibrillation: Secondary | ICD-10-CM | POA: Diagnosis not present

## 2020-09-23 NOTE — Progress Notes (Signed)
Electrophysiology Office Note   Date:  09/23/2020   ID:  Roy Long, Roy Long 1950-08-19, MRN [id number]  PCP:  Janith Lima, MD  Cardiologist:  Peter Martinique Primary Electrophysiologist:  Constance Haw, MD    No chief complaint on file.    History of Present Illness: KHYRE GERMOND is a 70 y.o. male who presents today for electrophysiology evaluation.     He has a history of atrial fibrillation, hypertension, hyperlipidemia, and depression.  He was admitted to [facility] 03/06/2015 with palpitations and noted to be in atrial flutter with 2-1 conduction.  His atrial fibrillation was diagnosed in 2006.  He also had atrial flutter at that time.  He had an ablation for atrial fibrillation/flutter 05/30/2015.  Today, denies symptoms of palpitations, chest pain, shortness of breath, orthopnea, PND, lower extremity edema, claudication, dizziness, presyncope, syncope, bleeding, or neurologic sequela. The patient is tolerating medications without difficulties.  Since being seen he has done well.  He continues to exercise, though he has noted that he has had to slow down as he has gotten older.  He is otherwise able to do all of his daily activities.  He continues to have some mild shortness of breath, though this has remained stable.  He has not had any weight gain or swelling.   Past Medical History:  Diagnosis Date   Age-related macular degeneration, dry, left eye    Alcohol dependence ([place])    Arthritis    "maybe a little bit in my left knee and right forefinger" (05/30/2015)   Atrial fibrillation ([place])    ablation 05-2015 with no reoccurance as of 09-2015   Basal cell carcinoma    Depression    hx   Family history of adverse reaction to anesthesia    "father had head injuries and dementia; everytime he was put under less of his mentation came back"   Gastroparesis    GERD (gastroesophageal reflux disease)    History of kidney stones    Hypercholesterolemia dx'd 02/2015    Hypertension    Hypothyroidism    Kidney stones    Migraine aura without headache    "very rare now" (05/30/2015)   Pneumonia ~ 2004   Squamous carcinoma    hand, chest   Past Surgical History:  Procedure Laterality Date   ANTERIOR CERVICAL DECOMP/DISCECTOMY FUSION  04/2009   ATRIAL FIBRILLATION ABLATION  05/30/2015   BACK SURGERY     BASAL CELL CARCINOMA EXCISION     chest or back or left arm   CARDIOVERSION  06/2002   CATARACT EXTRACTION W/ INTRAOCULAR LENS IMPLANT Left ~ 2012   COLONOSCOPY     COLONOSCOPY W/ POLYPECTOMY  02/2003   CYSTOSCOPY W/ STONE MANIPULATION  1980's   "basketted it out; no stent"   ELECTROPHYSIOLOGIC STUDY N/A 05/30/2015   Procedure: Atrial Fibrillation Ablation;  Surgeon: Dannell Raczkowski Meredith Leeds, MD;  Location: [facility];  Service: Cardiovascular;  Laterality: N/A;   EXTRACORPOREAL SHOCK WAVE LITHOTRIPSY Left 04/12/2016   Procedure: LEFT EXTRACORPOREAL SHOCK WAVE LITHOTRIPSY (ESWL);  Surgeon: Irine Seal, MD;  Location: [facility];  Service: Urology;  Laterality: Left;   INGUINAL HERNIA REPAIR Bilateral 2004   INGUINAL HERNIA REPAIR Bilateral 2004   MOHS SURGERY Right    "temple"   SQUAMOUS CELL CARCINOMA EXCISION     chest or back or left arm     Current Outpatient Medications  Medication Sig Dispense Refill   acyclovir (ZOVIRAX) 400 MG tablet TAKE  1 TABLET 3 TIMES A DAY 270 tablet 1   atorvastatin (LIPITOR) 80 MG tablet TAKE 1 TABLET BY MOUTH EVERY DAY 90 tablet 3   cholecalciferol (VITAMIN D) 1000 UNITS tablet Take 1,000 Units by mouth daily.     cycloSPORINE (RESTASIS) 0.05 % ophthalmic emulsion Place 1 drop into both eyes 2 (two) times daily.     diltiazem (CARDIZEM CD) 240 MG 24 hr capsule TAKE 1 CAPSULE BY MOUTH EVERY DAY 30 capsule 10   ELIQUIS 5 MG TABS tablet TAKE 1 TABLET BY MOUTH TWICE A DAY 180 tablet 1   finasteride (PROSCAR) 5 MG tablet TAKE 1 TABLET BY MOUTH EVERY DAY 90 tablet 1   fluticasone (FLONASE) 50 MCG/ACT nasal spray Place 2 sprays  into both nostrils daily. 48 g 1   hydrALAZINE (APRESOLINE) 50 MG tablet TAKE 1 TABLET BY MOUTH THREE TIMES A DAY 270 tablet 1   levothyroxine (SYNTHROID) 25 MCG tablet TAKE 1 TABLET BY MOUTH EVERY DAY BEFORE BREAKFAST 90 tablet 1   Multiple Vitamins-Minerals (PRESERVISION AREDS PO) Take 1 tablet by mouth daily.     omeprazole (PRILOSEC) 40 MG capsule TAKE 1 CAPSULE BY MOUTH EVERY DAY 90 capsule 1   tamsulosin (FLOMAX) 0.4 MG CAPS capsule TAKE 1 CAPSULE BY MOUTH EVERY DAY 90 capsule 1   TRINTELLIX 10 MG TABS tablet TAKE 1 TABLET BY MOUTH EVERY DAY 90 tablet 1   No current facility-administered medications for this visit.    Allergies:   Pneumococcal vaccines, Tape, and Amlodipine   Social History:  The patient  reports that he quit smoking about 37 years ago. His smoking use included cigarettes. He has a 51.00 pack-year smoking history. He has never used smokeless tobacco. He reports that he does not drink alcohol and does not use drugs.   Family History:  The patient's family history includes Cancer in his brother; Colon cancer (age of onset: 28) in his cousin; Heart failure in his mother; Stroke in his brother.   ROS:  Please see the history of present illness.   Otherwise, review of systems is positive for none.   All other systems are reviewed and negative.   PHYSICAL EXAM: VS:  BP 132/60   Pulse 68   Ht 5\' 8"  (1.727 m)   Wt 136 lb (61.7 kg)   SpO2 99%   BMI 20.68 kg/m  , BMI Body mass index is 20.68 kg/m. GEN: Well nourished, well developed, in no acute distress  HEENT: normal  Neck: no JVD, carotid bruits, or masses Cardiac: RRR; no murmurs, rubs, or gallops,no edema  Respiratory:  clear to auscultation bilaterally, normal work of breathing GI: soft, nontender, nondistended, + BS MS: no deformity or atrophy  Skin: warm and dry Neuro:  Strength and sensation are intact Psych: euthymic mood, full affect  EKG:  EKG is ordered today. Personal review of the ekg ordered shows  sinus rhythm, rate 68  Recent Labs: 04/18/2020: BUN 14; Creatinine, Ser 0.88; Hemoglobin 14.6; Platelets 226.0; Potassium 4.2; Sodium 135; TSH 3.45    Lipid Panel     Component Value Date/Time   CHOL 97 06/26/2019 1443   TRIG 100.0 06/26/2019 1443   HDL 40.90 06/26/2019 1443   CHOLHDL 2 06/26/2019 1443   VLDL 20.0 06/26/2019 1443   LDLCALC 36 06/26/2019 1443     Wt Readings from Last 3 Encounters:  09/23/20 136 lb (61.7 kg)  04/18/20 135 lb (61.2 kg)  02/12/20 136 lb (61.7 kg)  Other studies Reviewed: Additional studies/ records that were reviewed today include: TTE and MPI 12/12/14 Review of the above records today demonstrates:   Normal perfusion. LVEF 64% with normal wall motion. Excellent exercise tolerance. This is a low risk study.  - Left ventricle: The cavity size was normal. Systolic function was   normal. The estimated ejection fraction was in the range of 60%   to 65%. Wall motion was normal; there were no regional wall   motion abnormalities. Doppler parameters are consistent with   abnormal left ventricular relaxation (grade 1 diastolic   dysfunction). - Mitral valve: Mildly thickened leaflets .   ASSESSMENT AND PLAN:  1.  Persistent atrial fibrillation/flutter: Status post ablation 05/30/2015.  Currently on Eliquis and diltiazem with a CHA2DS2-VASc of 2.  He has had no further episodes of atrial fibrillation, only mild palpitations.  He is currently comfortable with his overall control.  No changes.  2.  Hypertension: Currently well controlled   Current medicines are reviewed at length with the patient today.   The patient has concerns regarding his medicines.  The following changes were made today: None  Labs/ tests ordered today include:  Orders Placed This Encounter  Procedures   EKG 12-Lead      Disposition:   FU with Orvella Digiulio 12 months.  Signed, Deron Poole Meredith Leeds, MD  09/23/2020 10:38 AM     [facility] [address] [place] [address] [phone] (office) [phone] (fax)

## 2020-09-24 DIAGNOSIS — R351 Nocturia: Secondary | ICD-10-CM | POA: Diagnosis not present

## 2020-09-24 DIAGNOSIS — R3121 Asymptomatic microscopic hematuria: Secondary | ICD-10-CM | POA: Diagnosis not present

## 2020-09-24 DIAGNOSIS — N401 Enlarged prostate with lower urinary tract symptoms: Secondary | ICD-10-CM | POA: Diagnosis not present

## 2020-09-24 DIAGNOSIS — N200 Calculus of kidney: Secondary | ICD-10-CM | POA: Diagnosis not present

## 2020-10-06 ENCOUNTER — Encounter: Payer: Self-pay | Admitting: Internal Medicine

## 2020-10-06 ENCOUNTER — Other Ambulatory Visit: Payer: Self-pay

## 2020-10-06 ENCOUNTER — Ambulatory Visit (INDEPENDENT_AMBULATORY_CARE_PROVIDER_SITE_OTHER): Payer: Medicare PPO

## 2020-10-06 ENCOUNTER — Ambulatory Visit: Payer: Medicare PPO | Admitting: Internal Medicine

## 2020-10-06 ENCOUNTER — Other Ambulatory Visit: Payer: Self-pay | Admitting: Internal Medicine

## 2020-10-06 VITALS — BP 132/70 | HR 73 | Temp 98.3°F | Ht 68.0 in | Wt 135.0 lb

## 2020-10-06 DIAGNOSIS — R059 Cough, unspecified: Secondary | ICD-10-CM | POA: Diagnosis not present

## 2020-10-06 DIAGNOSIS — N400 Enlarged prostate without lower urinary tract symptoms: Secondary | ICD-10-CM

## 2020-10-06 DIAGNOSIS — E785 Hyperlipidemia, unspecified: Secondary | ICD-10-CM

## 2020-10-06 DIAGNOSIS — Z0000 Encounter for general adult medical examination without abnormal findings: Secondary | ICD-10-CM

## 2020-10-06 DIAGNOSIS — E039 Hypothyroidism, unspecified: Secondary | ICD-10-CM | POA: Diagnosis not present

## 2020-10-06 DIAGNOSIS — R072 Precordial pain: Secondary | ICD-10-CM | POA: Diagnosis not present

## 2020-10-06 DIAGNOSIS — G5793 Unspecified mononeuropathy of bilateral lower limbs: Secondary | ICD-10-CM | POA: Diagnosis not present

## 2020-10-06 DIAGNOSIS — R0789 Other chest pain: Secondary | ICD-10-CM | POA: Diagnosis not present

## 2020-10-06 DIAGNOSIS — J449 Chronic obstructive pulmonary disease, unspecified: Secondary | ICD-10-CM | POA: Diagnosis not present

## 2020-10-06 DIAGNOSIS — J439 Emphysema, unspecified: Secondary | ICD-10-CM | POA: Diagnosis not present

## 2020-10-06 DIAGNOSIS — Z23 Encounter for immunization: Secondary | ICD-10-CM

## 2020-10-06 DIAGNOSIS — F418 Other specified anxiety disorders: Secondary | ICD-10-CM

## 2020-10-06 LAB — CBC WITH DIFFERENTIAL/PLATELET
Basophils Absolute: 0 10*3/uL (ref 0.0–0.1)
Basophils Relative: 0.5 % (ref 0.0–3.0)
Eosinophils Absolute: 0.1 10*3/uL (ref 0.0–0.7)
Eosinophils Relative: 1.6 % (ref 0.0–5.0)
HCT: 40.4 % (ref 39.0–52.0)
Hemoglobin: 13.8 g/dL (ref 13.0–17.0)
Lymphocytes Relative: 45.7 % (ref 12.0–46.0)
Lymphs Abs: 2.7 10*3/uL (ref 0.7–4.0)
MCHC: 34.2 g/dL (ref 30.0–36.0)
MCV: 92.6 fl (ref 78.0–100.0)
Monocytes Absolute: 0.5 10*3/uL (ref 0.1–1.0)
Monocytes Relative: 9.4 % (ref 3.0–12.0)
Neutro Abs: 2.5 10*3/uL (ref 1.4–7.7)
Neutrophils Relative %: 42.8 % — ABNORMAL LOW (ref 43.0–77.0)
Platelets: 205 10*3/uL (ref 150.0–400.0)
RBC: 4.36 Mil/uL (ref 4.22–5.81)
RDW: 12.7 % (ref 11.5–15.5)
WBC: 5.8 10*3/uL (ref 4.0–10.5)

## 2020-10-06 LAB — HEPATIC FUNCTION PANEL
ALT: 15 U/L (ref 0–53)
AST: 18 U/L (ref 0–37)
Albumin: 4.3 g/dL (ref 3.5–5.2)
Alkaline Phosphatase: 73 U/L (ref 39–117)
Bilirubin, Direct: 0.1 mg/dL (ref 0.0–0.3)
Total Bilirubin: 0.5 mg/dL (ref 0.2–1.2)
Total Protein: 6.4 g/dL (ref 6.0–8.3)

## 2020-10-06 LAB — BASIC METABOLIC PANEL
BUN: 16 mg/dL (ref 6–23)
CO2: 25 mEq/L (ref 19–32)
Calcium: 9.4 mg/dL (ref 8.4–10.5)
Chloride: 105 mEq/L (ref 96–112)
Creatinine, Ser: 0.84 mg/dL (ref 0.40–1.50)
GFR: 87.33 mL/min (ref >60.00)
Glucose, Bld: 110 mg/dL — ABNORMAL HIGH (ref 70–99)
Potassium: 4 mEq/L (ref 3.5–5.1)
Sodium: 138 mEq/L (ref 135–145)

## 2020-10-06 LAB — LIPID PANEL
Cholesterol: 91 mg/dL (ref 0–200)
HDL: 39.9 mg/dL (ref >39.00)
LDL Cholesterol: 21 mg/dL (ref 0–99)
NonHDL: 50.81
Total CHOL/HDL Ratio: 2
Triglycerides: 150 mg/dL — ABNORMAL HIGH (ref 0.0–149.0)
VLDL: 30 mg/dL (ref 0.0–40.0)

## 2020-10-06 LAB — BRAIN NATRIURETIC PEPTIDE: Pro B Natriuretic peptide (BNP): 11 pg/mL (ref 0.0–100.0)

## 2020-10-06 LAB — VITAMIN B12: Vitamin B-12: 645 pg/mL (ref 211–911)

## 2020-10-06 LAB — TSH: TSH: 4.07 u[IU]/mL (ref 0.35–5.50)

## 2020-10-06 LAB — FOLATE: Folate: 18.8 ng/mL (ref >5.9)

## 2020-10-06 NOTE — Patient Instructions (Signed)
Health Maintenance, Male Adopting a healthy lifestyle and getting preventive care are important in promoting health and wellness. Ask your health care provider about: The right schedule for you to have regular tests and exams. Things you can do on your own to prevent diseases and keep yourself healthy. What should I know about diet, weight, and exercise? Eat a healthy diet  Eat a diet that includes plenty of vegetables, fruits, low-fat dairy products, and lean protein. Do not eat a lot of foods that are high in solid fats, added sugars, or sodium.  Maintain a healthy weight Body mass index (BMI) is a measurement that can be used to identify possible weight problems. It estimates body fat based on height and weight. Your health care provider can help determine your BMI and help you achieve or maintain ahealthy weight. Get regular exercise Get regular exercise. This is one of the most important things you can do for your health. Most adults should: Exercise for at least 150 minutes each week. The exercise should increase your heart rate and make you sweat (moderate-intensity exercise). Do strengthening exercises at least twice a week. This is in addition to the moderate-intensity exercise. Spend less time sitting. Even light physical activity can be beneficial. Watch cholesterol and blood lipids Have your blood tested for lipids and cholesterol at 72 years of age, then havethis test every 5 years. You may need to have your cholesterol levels checked more often if: Your lipid or cholesterol levels are high. You are older than 72 years of age. You are at high risk for heart disease. What should I know about cancer screening? Many types of cancers can be detected early and may often be prevented. Depending on your health history and family history, you may need to have cancer screening at various ages. This may include screening for: Colorectal cancer. Prostate cancer. Skin cancer. Lung  cancer. What should I know about heart disease, diabetes, and high blood pressure? Blood pressure and heart disease High blood pressure causes heart disease and increases the risk of stroke. This is more likely to develop in people who have high blood pressure readings, are of African descent, or are overweight. Talk with your health care provider about your target blood pressure readings. Have your blood pressure checked: Every 3-5 years if you are 18-39 years of age. Every year if you are 40 years old or older. If you are between the ages of 65 and 75 and are a current or former smoker, ask your health care provider if you should have a one-time screening for abdominal aortic aneurysm (AAA). Diabetes Have regular diabetes screenings. This checks your fasting blood sugar level. Have the screening done: Once every three years after age 45 if you are at a normal weight and have a low risk for diabetes. More often and at a younger age if you are overweight or have a high risk for diabetes. What should I know about preventing infection? Hepatitis B If you have a higher risk for hepatitis B, you should be screened for this virus. Talk with your health care provider to find out if you are at risk forhepatitis B infection. Hepatitis C Blood testing is recommended for: Everyone born from 1945 through 1965. Anyone with known risk factors for hepatitis C. Sexually transmitted infections (STIs) You should be screened each year for STIs, including gonorrhea and chlamydia, if: You are sexually active and are younger than 72 years of age. You are older than 72 years of age   and your health care provider tells you that you are at risk for this type of infection. Your sexual activity has changed since you were last screened, and you are at increased risk for chlamydia or gonorrhea. Ask your health care provider if you are at risk. Ask your health care provider about whether you are at high risk for HIV.  Your health care provider may recommend a prescription medicine to help prevent HIV infection. If you choose to take medicine to prevent HIV, you should first get tested for HIV. You should then be tested every 3 months for as long as you are taking the medicine. Follow these instructions at home: Lifestyle Do not use any products that contain nicotine or tobacco, such as cigarettes, e-cigarettes, and chewing tobacco. If you need help quitting, ask your health care provider. Do not use street drugs. Do not share needles. Ask your health care provider for help if you need support or information about quitting drugs. Alcohol use Do not drink alcohol if your health care provider tells you not to drink. If you drink alcohol: Limit how much you have to 0-2 drinks a day. Be aware of how much alcohol is in your drink. In the [place] one drink equals one 12 oz bottle of beer (355 mL), one 5 oz glass of wine (148 mL), or one 1 oz glass of hard liquor (44 mL). General instructions Schedule regular health, dental, and eye exams. Stay current with your vaccines. Tell your health care provider if: You often feel depressed. You have ever been abused or do not feel safe at home. Summary Adopting a healthy lifestyle and getting preventive care are important in promoting health and wellness. Follow your health care provider's instructions about healthy diet, exercising, and getting tested or screened for diseases. Follow your health care provider's instructions on monitoring your cholesterol and blood pressure. This information is not intended to replace advice given to you by your health care provider. Make sure you discuss any questions you have with your healthcare provider. Document Revised: 03/08/2018 Document Reviewed: 03/08/2018 Elsevier Patient Education  2022 [employer]  

## 2020-10-06 NOTE — Progress Notes (Signed)
Subjective:  Patient ID: Roy Long, male    DOB: [birth year]  Age: 70 y.o. MRN: [id number]  CC: Annual Exam, Hypothyroidism, and Atrial Fibrillation  This visit occurred during the SARS-CoV-2 public health emergency.  Safety protocols were in place, including screening questions prior to the visit, additional usage of staff PPE, and extensive cleaning of exam room while observing appropriate contact time as indicated for disinfecting solutions.    HPI Roy Long presents for a CPX and f/up -   He complains of chronic DOE and a recent episode of chest pain that happened about 2 weeks ago.  He tells me he told his cardiologist about this and that his EKG was okay and his cardiologist was not concerned about the CP.  He describes the chest pain as sharp and located around his left breast and is worsened with sneezing, coughing, and deep breath.  He has a mild intermittent cough that is nonproductive.  He complains of chronic fatigue.  He also complains of chronic neuropathy symptoms with tingling in his toes, worse on the left than the right.  There is no pain with the neuropathy.  Outpatient Medications Prior to Visit  Medication Sig Dispense Refill   acyclovir (ZOVIRAX) 400 MG tablet TAKE 1 TABLET 3 TIMES A DAY 270 tablet 1   atorvastatin (LIPITOR) 80 MG tablet TAKE 1 TABLET BY MOUTH EVERY DAY 90 tablet 3   cholecalciferol (VITAMIN D) 1000 UNITS tablet Take 1,000 Units by mouth daily.     cycloSPORINE (RESTASIS) 0.05 % ophthalmic emulsion Place 1 drop into both eyes 2 (two) times daily.     diltiazem (CARDIZEM CD) 240 MG 24 hr capsule TAKE 1 CAPSULE BY MOUTH EVERY DAY 30 capsule 10   ELIQUIS 5 MG TABS tablet TAKE 1 TABLET BY MOUTH TWICE A DAY 180 tablet 1   finasteride (PROSCAR) 5 MG tablet TAKE 1 TABLET BY MOUTH EVERY DAY 90 tablet 1   fluticasone (FLONASE) 50 MCG/ACT nasal spray Place 2 sprays into both nostrils daily. 48 g 1   hydrALAZINE (APRESOLINE) 50 MG tablet TAKE 1 TABLET BY  MOUTH THREE TIMES A DAY 270 tablet 1   levothyroxine (SYNTHROID) 25 MCG tablet TAKE 1 TABLET BY MOUTH EVERY DAY BEFORE BREAKFAST 90 tablet 1   Multiple Vitamins-Minerals (PRESERVISION AREDS PO) Take 1 tablet by mouth daily.     omeprazole (PRILOSEC) 40 MG capsule TAKE 1 CAPSULE BY MOUTH EVERY DAY 90 capsule 1   tamsulosin (FLOMAX) 0.4 MG CAPS capsule TAKE 1 CAPSULE BY MOUTH EVERY DAY 90 capsule 1   TRINTELLIX 10 MG TABS tablet TAKE 1 TABLET BY MOUTH EVERY DAY 90 tablet 1   No facility-administered medications prior to visit.    ROS Review of Systems  Constitutional:  Positive for fatigue. Negative for chills, diaphoresis and unexpected weight change.  HENT: Negative.  Negative for sore throat.   Respiratory:  Positive for cough and shortness of breath. Negative for chest tightness and wheezing.   Cardiovascular:  Negative for chest pain, palpitations and leg swelling.  Gastrointestinal:  Negative for abdominal pain, constipation, diarrhea, nausea and vomiting.  Endocrine: Negative.   Genitourinary: Negative.   Musculoskeletal:  Negative for arthralgias, back pain, myalgias and neck pain.  Skin: Negative.   Neurological: Negative.  Negative for dizziness and syncope.  Hematological:  Negative for adenopathy. Does not bruise/bleed easily.  Psychiatric/Behavioral: Negative.     Objective:  BP 132/70 (BP Location: Left Arm, Patient Position: Sitting, Cuff Size: Normal)  Pulse 73   Temp 98.3 F (36.8 C) (Oral)   Ht 5\' 8"  (1.727 m)   Wt 135 lb (61.2 kg)   SpO2 97%   BMI 20.53 kg/m   BP Readings from Last 3 Encounters:  10/06/20 132/70  09/23/20 132/60  04/18/20 140/60    Wt Readings from Last 3 Encounters:  10/06/20 135 lb (61.2 kg)  09/23/20 136 lb (61.7 kg)  04/18/20 135 lb (61.2 kg)    Physical Exam Vitals reviewed.  Constitutional:      Appearance: Normal appearance.  HENT:     Nose: Nose normal.     Mouth/Throat:     Mouth: Mucous membranes are moist.  Eyes:      General: No scleral icterus.    Conjunctiva/sclera: Conjunctivae normal.  Cardiovascular:     Rate and Rhythm: Normal rate and regular rhythm.     Heart sounds: No murmur heard.   No friction rub. No gallop.  Pulmonary:     Effort: Pulmonary effort is normal.     Breath sounds: No stridor. No wheezing or rales.  Abdominal:     General: Abdomen is flat.     Palpations: Abdomen is soft. There is no mass.     Tenderness: There is no abdominal tenderness. There is no guarding.  Genitourinary:    Comments: He deferred on the GU/rectal exam. Musculoskeletal:        General: Normal range of motion.     Cervical back: Neck supple.     Right lower leg: No edema.     Left lower leg: No edema.  Skin:    General: Skin is warm and dry.     Coloration: Skin is not pale.  Neurological:     General: No focal deficit present.     Mental Status: He is alert. Mental status is at baseline.     Sensory: No sensory deficit.     Gait: Gait normal.     Deep Tendon Reflexes: Reflexes normal.  Psychiatric:        Mood and Affect: Mood normal.        Behavior: Behavior normal.    Lab Results  Component Value Date   WBC 5.8 10/06/2020   HGB 13.8 10/06/2020   HCT 40.4 10/06/2020   PLT 205.0 10/06/2020   GLUCOSE 110 (H) 10/06/2020   CHOL 91 10/06/2020   TRIG 150.0 (H) 10/06/2020   HDL 39.90 10/06/2020   LDLCALC 21 10/06/2020   ALT 15 10/06/2020   AST 18 10/06/2020   NA 138 10/06/2020   K 4.0 10/06/2020   CL 105 10/06/2020   CREATININE 0.84 10/06/2020   BUN 16 10/06/2020   CO2 25 10/06/2020   TSH 4.07 10/06/2020   PSA 0.13 10/09/2020   INR 1.0 04/15/2011   HGBA1C 5.5 04/18/2020    DG Chest 2 View  Result Date: 10/07/2020 CLINICAL DATA:  LEFT side chest pain and chest wall pain with coughing, sneezing and inhalation, shortness of breath with exercise for 1 week, no known injury, former smoker, atrial fibrillation, hypertension EXAM: CHEST - 2 VIEW COMPARISON:  03/06/2015 FINDINGS:  Normal heart size, mediastinal contours, and pulmonary vascularity. Emphysematous changes with chronic RIGHT apical scarring. No acute infiltrate, pleural effusion, or pneumothorax. Osseous demineralization with biconvex thoracic scoliosis and evidence of prior inferior cervical spine fusion. IMPRESSION: COPD changes with RIGHT apical scarring. No acute abnormalities. Electronically Signed   By: Lavonia Dana M.D.   On: 10/07/2020 14:09  Assessment & Plan:   Roy Long was seen today for annual exam, hypothyroidism and atrial fibrillation.  Diagnoses and all orders for this visit:  Precordial pain- His exam, chest x-ray, and labs are reassuring.  This was likely pleurisy.  He tells me its resolving and does not require treatment. -     DG Chest 2 View; Future -     Basic metabolic panel; Future -     Brain natriuretic peptide; Future -     D-dimer, quantitative; Future -     D-dimer, quantitative -     Brain natriuretic peptide -     Basic metabolic panel  Acquired hypothyroidism- His TSH is in the normal range.  He will stay on the current dose of levothyroxine. -     TSH; Future -     TSH  Neuropathy involving both lower extremities- I will screen for secondary causes of neuropathy. -     Basic metabolic panel; Future -     Vitamin B1; Future -     CBC with Differential/Platelet; Future -     Folate; Future -     Vitamin B12; Future -     Vitamin B12 -     Folate -     CBC with Differential/Platelet -     Vitamin B1 -     Basic metabolic panel  Benign prostatic hyperplasia without lower urinary tract symptoms- His PSA is low.  This is a reassuring sign that he does not have prostate cancer. -     PSA; Future  Hyperlipidemia with target LDL less than 70- LDL goal achieved. Doing well on the statin  -     Hepatic function panel; Future -     Lipid panel; Future -     Lipid panel -     Hepatic function panel  Routine general medical examination at a health care facility- Exam  completed, labs reviewed, vaccines reviewed and updated, cancer screenings are up-to-date, patient education was given.  I am having Roy Long maintain his cycloSPORINE, cholecalciferol, Multiple Vitamins-Minerals (PRESERVISION AREDS PO), fluticasone, tamsulosin, atorvastatin, diltiazem, Eliquis, hydrALAZINE, levothyroxine, finasteride, acyclovir, omeprazole, and Trintellix.  No orders of the defined types were placed in this encounter.    Follow-up: Return in about 3 months (around 01/06/2021).  [name], MD

## 2020-10-09 ENCOUNTER — Other Ambulatory Visit (INDEPENDENT_AMBULATORY_CARE_PROVIDER_SITE_OTHER): Payer: Medicare PPO

## 2020-10-09 DIAGNOSIS — N400 Enlarged prostate without lower urinary tract symptoms: Secondary | ICD-10-CM | POA: Diagnosis not present

## 2020-10-09 LAB — PSA: PSA: 0.13 ng/mL (ref 0.10–4.00)

## 2020-10-10 LAB — VITAMIN B1: Vitamin B1 (Thiamine): 26 nmol/L (ref 8–30)

## 2020-10-10 LAB — D-DIMER, QUANTITATIVE: D-Dimer, Quant: <0.22 mcg/mL FEU (ref <0.50)

## 2020-10-10 MED ORDER — SHINGRIX 50 MCG/0.5ML IM SUSR: 0.5000 mL | Freq: Once | INTRAMUSCULAR | 1 refills | Status: AC

## 2020-10-13 ENCOUNTER — Other Ambulatory Visit: Payer: Self-pay | Admitting: Cardiology

## 2020-10-13 ENCOUNTER — Other Ambulatory Visit: Payer: Self-pay | Admitting: Internal Medicine

## 2020-10-13 DIAGNOSIS — E039 Hypothyroidism, unspecified: Secondary | ICD-10-CM

## 2020-10-13 NOTE — Telephone Encounter (Signed)
Prescription refill request for Eliquis received. Indication: afib  Last office visit: [place], 09/23/2020 Scr: 0.84, 10/06/2020 Age: 70 yo  Weight: 61.2 kg   Pt is on the correct dose of Eliquis per dosing criteria, prescription refill sent for Eliquis 5 mg BID.

## 2020-10-31 ENCOUNTER — Other Ambulatory Visit: Payer: Self-pay | Admitting: Internal Medicine

## 2020-10-31 DIAGNOSIS — J301 Allergic rhinitis due to pollen: Secondary | ICD-10-CM

## 2020-11-02 ENCOUNTER — Other Ambulatory Visit: Payer: Self-pay | Admitting: Internal Medicine

## 2020-11-02 DIAGNOSIS — N400 Enlarged prostate without lower urinary tract symptoms: Secondary | ICD-10-CM

## 2020-11-03 ENCOUNTER — Other Ambulatory Visit: Payer: Self-pay | Admitting: Internal Medicine

## 2020-11-03 ENCOUNTER — Other Ambulatory Visit: Payer: Self-pay | Admitting: Cardiology

## 2020-11-03 DIAGNOSIS — I2510 Atherosclerotic heart disease of native coronary artery without angina pectoris: Secondary | ICD-10-CM

## 2020-11-03 DIAGNOSIS — I10 Essential (primary) hypertension: Secondary | ICD-10-CM

## 2020-11-04 ENCOUNTER — Other Ambulatory Visit: Payer: Self-pay | Admitting: Internal Medicine

## 2020-11-04 DIAGNOSIS — J301 Allergic rhinitis due to pollen: Secondary | ICD-10-CM

## 2020-11-17 ENCOUNTER — Other Ambulatory Visit: Payer: Self-pay | Admitting: Cardiology

## 2020-11-17 ENCOUNTER — Other Ambulatory Visit: Payer: Self-pay | Admitting: Internal Medicine

## 2020-11-17 DIAGNOSIS — E785 Hyperlipidemia, unspecified: Secondary | ICD-10-CM

## 2020-12-22 ENCOUNTER — Other Ambulatory Visit: Payer: Self-pay | Admitting: Internal Medicine

## 2020-12-22 DIAGNOSIS — K219 Gastro-esophageal reflux disease without esophagitis: Secondary | ICD-10-CM

## 2021-01-07 ENCOUNTER — Other Ambulatory Visit: Payer: Self-pay

## 2021-01-07 ENCOUNTER — Encounter: Payer: Self-pay | Admitting: Internal Medicine

## 2021-01-07 ENCOUNTER — Ambulatory Visit: Payer: Medicare PPO | Admitting: Internal Medicine

## 2021-01-07 VITALS — BP 130/76 | HR 66 | Temp 97.6°F | Resp 16 | Ht 68.0 in | Wt 134.0 lb

## 2021-01-07 DIAGNOSIS — J431 Panlobular emphysema: Secondary | ICD-10-CM | POA: Diagnosis not present

## 2021-01-07 DIAGNOSIS — E039 Hypothyroidism, unspecified: Secondary | ICD-10-CM | POA: Diagnosis not present

## 2021-01-07 DIAGNOSIS — Z23 Encounter for immunization: Secondary | ICD-10-CM

## 2021-01-07 LAB — TSH: TSH: 3.18 u[IU]/mL (ref 0.35–5.50)

## 2021-01-07 MED ORDER — SPIRIVA RESPIMAT 2.5 MCG/ACT IN AERS: 2.0000 | INHALATION_SPRAY | Freq: Every day | RESPIRATORY_TRACT | 1 refills | Status: DC

## 2021-01-07 NOTE — Progress Notes (Signed)
Subjective:  Patient ID: Roy Long, male    DOB: 1950-12-14  Age: 70 y.o. MRN: [id number]  CC: COPD  This visit occurred during the SARS-CoV-2 public health emergency.  Safety protocols were in place, including screening questions prior to the visit, additional usage of staff PPE, and extensive cleaning of exam room while observing appropriate contact time as indicated for disinfecting solutions.    HPI Roy Long presents for f/up -  He complains of chronic dyspnea on exertion.  He has a remote history of tobacco abuse.  He had a chest x-ray done about 3 months ago that showed COPD.  He denies cough, diaphoresis, chest pain, dizziness, or lightheadedness.  Outpatient Medications Prior to Visit  Medication Sig Dispense Refill   acyclovir (ZOVIRAX) 400 MG tablet TAKE 1 TABLET 3 TIMES A DAY 270 tablet 1   atorvastatin (LIPITOR) 80 MG tablet TAKE 1 TABLET BY MOUTH EVERY DAY 90 tablet 3   cholecalciferol (VITAMIN D) 1000 UNITS tablet Take 1,000 Units by mouth daily.     cycloSPORINE (RESTASIS) 0.05 % ophthalmic emulsion Place 1 drop into both eyes 2 (two) times daily.     diltiazem (CARDIZEM CD) 240 MG 24 hr capsule TAKE 1 CAPSULE BY MOUTH EVERY DAY 90 capsule 3   ELIQUIS 5 MG TABS tablet TAKE 1 TABLET BY MOUTH TWICE A DAY 180 tablet 1   finasteride (PROSCAR) 5 MG tablet TAKE 1 TABLET BY MOUTH EVERY DAY 90 tablet 1   fluticasone (FLONASE) 50 MCG/ACT nasal spray Place 2 sprays into both nostrils daily. 48 g 1   hydrALAZINE (APRESOLINE) 50 MG tablet TAKE 1 TABLET BY MOUTH THREE TIMES A DAY 270 tablet 1   levothyroxine (SYNTHROID) 25 MCG tablet TAKE 1 TABLET BY MOUTH EVERY DAY BEFORE BREAKFAST 90 tablet 1   montelukast (SINGULAIR) 10 MG tablet TAKE 1 TABLET BY MOUTH EVERYDAY AT BEDTIME 90 tablet 1   Multiple Vitamins-Minerals (PRESERVISION AREDS PO) Take 1 tablet by mouth daily.     omeprazole (PRILOSEC) 40 MG capsule TAKE 1 CAPSULE BY MOUTH EVERY DAY 90 capsule 1   tamsulosin (FLOMAX)  0.4 MG CAPS capsule TAKE 1 CAPSULE BY MOUTH EVERY DAY 90 capsule 1   TRINTELLIX 10 MG TABS tablet TAKE 1 TABLET BY MOUTH EVERY DAY 90 tablet 1   No facility-administered medications prior to visit.    ROS Review of Systems  Constitutional:  Negative for appetite change, diaphoresis and fatigue.  HENT: Negative.    Eyes:  Negative for visual disturbance.  Respiratory:  Positive for shortness of breath. Negative for cough, chest tightness and wheezing.   Cardiovascular:  Negative for chest pain, palpitations and leg swelling.  Gastrointestinal:  Negative for abdominal pain, constipation, diarrhea, nausea and vomiting.  Endocrine: Negative.   Genitourinary: Negative.  Negative for difficulty urinating.  Musculoskeletal:  Negative for arthralgias and myalgias.  Skin: Negative.  Negative for color change and pallor.  Neurological:  Negative for dizziness, weakness and headaches.  Hematological:  Negative for adenopathy. Does not bruise/bleed easily.  Psychiatric/Behavioral: Negative.     Objective:  BP 130/76 (BP Location: Left Arm, Patient Position: Sitting, Cuff Size: Large)   Pulse 66   Temp 97.6 F (36.4 C) (Oral)   Resp 16   Ht 5\' 8"  (1.727 m)   Wt 134 lb (60.8 kg)   SpO2 97%   BMI 20.37 kg/m   BP Readings from Last 3 Encounters:  01/07/21 130/76  10/06/20 132/70  09/23/20 132/60  Wt Readings from Last 3 Encounters:  01/07/21 134 lb (60.8 kg)  10/06/20 135 lb (61.2 kg)  09/23/20 136 lb (61.7 kg)    Physical Exam Vitals reviewed.  HENT:     Nose: Nose normal.     Mouth/Throat:     Mouth: Mucous membranes are moist.  Eyes:     General: No scleral icterus.    Conjunctiva/sclera: Conjunctivae normal.  Cardiovascular:     Rate and Rhythm: Normal rate and regular rhythm.     Heart sounds: No murmur heard. Pulmonary:     Effort: Pulmonary effort is normal.     Breath sounds: No stridor. No wheezing, rhonchi or rales.  Abdominal:     General: Abdomen is flat.      Palpations: There is no mass.     Tenderness: There is no abdominal tenderness. There is no guarding.  Musculoskeletal:        General: Normal range of motion.     Cervical back: Neck supple.     Right lower leg: No edema.     Left lower leg: No edema.  Lymphadenopathy:     Cervical: No cervical adenopathy.  Skin:    General: Skin is warm and dry.  Neurological:     General: No focal deficit present.     Mental Status: He is alert.  Psychiatric:        Mood and Affect: Mood normal.        Behavior: Behavior normal.    Lab Results  Component Value Date   WBC 5.8 10/06/2020   HGB 13.8 10/06/2020   HCT 40.4 10/06/2020   PLT 205.0 10/06/2020   GLUCOSE 110 (H) 10/06/2020   CHOL 91 10/06/2020   TRIG 150.0 (H) 10/06/2020   HDL 39.90 10/06/2020   LDLCALC 21 10/06/2020   ALT 15 10/06/2020   AST 18 10/06/2020   NA 138 10/06/2020   K 4.0 10/06/2020   CL 105 10/06/2020   CREATININE 0.84 10/06/2020   BUN 16 10/06/2020   CO2 25 10/06/2020   TSH 3.18 01/07/2021   PSA 0.13 10/09/2020   INR 1.0 04/15/2011   HGBA1C 5.5 04/18/2020   IMPRESSION: COPD changes with RIGHT apical scarring.   No acute abnormalities.     Electronically Signed   By: Lavonia Dana M.D.   On: 10/07/2020 14:09    Assessment & Plan:   Sutter was seen today for copd.  Diagnoses and all orders for this visit:  Panlobular emphysema ([place])- Will treat this with a LAMA. -     Tiotropium Bromide Monohydrate (SPIRIVA RESPIMAT) 2.5 MCG/ACT AERS; Inhale 2 puffs into the lungs daily.  Need for vaccination -     Pneumococcal conjugate vaccine 20-valent (Prevnar 20)  Acquired hypothyroidism- His TSH is in the normal range.  He will stay on the current dose of levothyroxine. -     TSH; Future -     TSH  I am having Jamill L. Lien start on Spiriva Respimat. I am also having him maintain his cycloSPORINE, cholecalciferol, Multiple Vitamins-Minerals (PRESERVISION AREDS PO), fluticasone, acyclovir,  Trintellix, Eliquis, levothyroxine, tamsulosin, hydrALAZINE, diltiazem, montelukast, atorvastatin, finasteride, and omeprazole.  Meds ordered this encounter  Medications   Tiotropium Bromide Monohydrate (SPIRIVA RESPIMAT) 2.5 MCG/ACT AERS    Sig: Inhale 2 puffs into the lungs daily.    Dispense:  12 g    Refill:  1      Follow-up: Return in about 3 months (around 04/09/2021).  [name], MD

## 2021-01-07 NOTE — Patient Instructions (Signed)
Chronic Obstructive Pulmonary Disease °Chronic obstructive pulmonary disease (COPD) is a long-term (chronic) condition that affects the lungs. COPD is a general term that can be used to describe many different lung problems that cause lung inflammation and limit airflow, including chronic bronchitis and emphysema. °If you have COPD, your lung function will probably never return to normal. In most cases, it gets worse over time. However, there are steps you can take to slow the progression of the disease and improve your quality of life. °What are the causes? °This condition may be caused by: °Smoking. This is the most common cause. °Certain genes passed down through families. °What increases the risk? °The following factors may make you more likely to develop this condition: °Being exposed to secondhand smoke from cigarettes, pipes, or cigars. °Being exposed to chemicals and other irritants, such as fumes and dust in the work environment. °Having chronic lung conditions or infections. °What are the signs or symptoms? °Symptoms of this condition include: °Shortness of breath, especially during physical activity. °Chronic cough with a large amount of thick mucus. Sometimes, the cough may not have any mucus (dry cough). °Wheezing and rapid breathing. °Gray or bluish discoloration (cyanosis) of the skin, especially in the fingers, toes, or lips. °Feeling tired (fatigue). °Weight loss. °Chest tightness. °Frequent infections. °Episodes when breathing symptoms become much worse (exacerbations). °At the later stages of this disease, you may have swelling in the ankles, feet, or legs. °How is this diagnosed? °This condition is diagnosed based on: °Your medical history. °A physical exam. °You may also have tests, including: °Lung (pulmonary) function tests. This may include a spirometry test, which measures your ability to exhale properly. °Chest X-ray. °CT scan. °Blood tests. °How is this treated? °This condition may be  treated with: °Medicines. These may include inhaled rescue medicines to treat acute exacerbations as well as medicines that you take long-term (maintenance medicines) to prevent flare-ups of COPD. °Bronchodilators help treat COPD by dilating the airways to allow increased airflow and make your breathing more comfortable. °Steroids can reduce airway inflammation and help prevent exacerbations. °Smoking cessation. If you smoke, your health care provider may ask you to quit, and may also recommend therapy or replacement products to help you quit. °Pulmonary rehabilitation. This may involve working with a team of health care providers and specialists, such as respiratory, occupational, and physical therapists. °Exercise and physical activity. These are beneficial for nearly all people with COPD. °Nutrition therapy to gain weight, if you are underweight. °Oxygen. Supplemental oxygen therapy is only helpful if you have a low oxygen level in your blood (hypoxemia). °Lung surgery or transplant. °Palliative care. This is to help people with COPD feel comfortable when treatment is no longer working. °Follow these instructions at home: °Medicines °Take over-the-counter and prescription medicines only as told by your health care provider. This includes inhaled medicines and pills. °Talk to your health care provider before taking any cough or allergy medicines. You may need to avoid certain medicines that dry out your airways. °Lifestyle °If you smoke, the most important thing that you can do is to stop smoking. Continuing to smoke will cause the disease to progress faster. °Do not use any products that contain nicotine or tobacco. These products include cigarettes, chewing tobacco, and vaping devices, such as e-cigarettes. If you need help quitting, ask your health care provider. °Avoid exposure to things that irritate your lungs, such as smoke, chemicals, and fumes. °Stay active, but balance activity with periods of rest.  Exercise and physical   activity will help you maintain your ability to do things you want to do. °Learn and use relaxation techniques to manage stress and to control your breathing. °Get the right amount of sleep and get quality sleep. Most adults need 7 or more hours per night. °Eat healthy foods. Eating smaller, more frequent meals and resting before meals may help you maintain your strength. °Controlled breathing °Learn and use controlled breathing techniques as directed by your health care provider. Controlled breathing techniques include: °Pursed lip breathing. Start by breathing in (inhaling) through your nose for 1 second. Then, purse your lips as if you were going to whistle and breathe out (exhale) through the pursed lips for 2 seconds. °Diaphragmatic breathing. Start by putting one hand on your abdomen just above your waist. Inhale slowly through your nose. The hand on your abdomen should move out. Then purse your lips and exhale slowly. You should be able to feel the hand on your abdomen moving in as you exhale. ° °Controlled coughing °Learn and use controlled coughing to clear mucus from your lungs. Controlled coughing is a series of short, progressive coughs. The steps of controlled coughing are: °Lean your head slightly forward. °Breathe in deeply using diaphragmatic breathing. °Try to hold your breath for 3 seconds. °Keep your mouth slightly open while coughing twice. °Spit any mucus out into a tissue. °Rest and repeat the steps once or twice as needed. °General instructions °Make sure you receive all the vaccines that your health care provider recommends, especially the pneumococcal and influenza vaccines. Preventing infection and hospitalization is very important when you have COPD. °Drink enough fluid to keep your urine pale yellow, unless you have a medical condition that requires fluid restriction. °Use oxygen therapy and pulmonary rehabilitation if told by your health care provider. If you  require home oxygen therapy, ask your health care provider whether you should purchase a pulse oximeter to measure your oxygen level at home. °Work with your health care provider to develop a COPD action plan. This will help you know what steps to take if your condition gets worse. °Keep other chronic health conditions under control as told by your health care provider. °Avoid extreme temperature and humidity changes. °Avoid contact with people who have an illness that spreads from person to person (is contagious), such as viral infections or pneumonia. °Keep all follow-up visits. This is important. °Contact a health care provider if: °You are coughing up more mucus than usual. °There is a change in the color or thickness of your mucus. °Your breathing is more labored than usual. °Your breathing is faster than usual. °You have difficulty sleeping. °You need to use your rescue medicines or inhalers more often than expected. °You have trouble doing routine activities such as getting dressed or walking around the house. °Get help right away if: °You have shortness of breath while you are resting. °You have shortness of breath that prevents you from: °Being able to talk. °Performing your usual physical activities. °You have chest pain lasting longer than 5 minutes. °Your skin color is more blue (cyanotic) than usual. °You measure low oxygen saturations for longer than 5 minutes with a pulse oximeter. °You have a fever. °You feel too tired to breathe normally. °These symptoms may represent a serious problem that is an emergency. Do not wait to see if the symptoms will go away. Get medical help right away. Call your local emergency services (911 in the [place] Do not drive yourself to the hospital. °Summary °Chronic obstructive pulmonary   disease (COPD) is a long-term (chronic) condition that affects the lungs. °Your lung function will probably never return to normal. In most cases, it gets worse over time. However, there  are steps you can take to slow the progression of the disease and improve your quality of life. °Treatment for COPD may include taking medicines, quitting smoking, pulmonary rehabilitation, and changes to diet and exercise. As the disease progresses, you may need oxygen therapy, a lung transplant, or palliative care. °To help manage your condition, do not smoke, avoid exposure to things that irritate your lungs, stay up to date on all vaccines, and follow your health care provider's instructions for taking medicines. °This information is not intended to replace advice given to you by your health care provider. Make sure you discuss any questions you have with your health care provider. °Document Revised: 01/22/2020 Document Reviewed: 01/22/2020 °Elsevier Patient Education © 2022 [employer] ° °

## 2021-01-27 DIAGNOSIS — H02831 Dermatochalasis of right upper eyelid: Secondary | ICD-10-CM | POA: Diagnosis not present

## 2021-01-27 DIAGNOSIS — H18413 Arcus senilis, bilateral: Secondary | ICD-10-CM | POA: Diagnosis not present

## 2021-01-27 DIAGNOSIS — Z961 Presence of intraocular lens: Secondary | ICD-10-CM | POA: Diagnosis not present

## 2021-03-16 DIAGNOSIS — L821 Other seborrheic keratosis: Secondary | ICD-10-CM | POA: Diagnosis not present

## 2021-03-16 DIAGNOSIS — L570 Actinic keratosis: Secondary | ICD-10-CM | POA: Diagnosis not present

## 2021-03-16 DIAGNOSIS — L814 Other melanin hyperpigmentation: Secondary | ICD-10-CM | POA: Diagnosis not present

## 2021-03-16 DIAGNOSIS — D692 Other nonthrombocytopenic purpura: Secondary | ICD-10-CM | POA: Diagnosis not present

## 2021-03-16 DIAGNOSIS — D0462 Carcinoma in situ of skin of left upper limb, including shoulder: Secondary | ICD-10-CM | POA: Diagnosis not present

## 2021-03-16 DIAGNOSIS — Z85828 Personal history of other malignant neoplasm of skin: Secondary | ICD-10-CM | POA: Diagnosis not present

## 2021-04-02 ENCOUNTER — Other Ambulatory Visit: Payer: Self-pay | Admitting: Internal Medicine

## 2021-04-02 DIAGNOSIS — F418 Other specified anxiety disorders: Secondary | ICD-10-CM

## 2021-04-02 DIAGNOSIS — E039 Hypothyroidism, unspecified: Secondary | ICD-10-CM

## 2021-04-09 ENCOUNTER — Other Ambulatory Visit: Payer: Self-pay | Admitting: Cardiology

## 2021-04-09 NOTE — Telephone Encounter (Signed)
Prescription refill request for Eliquis received. Indication: Afib  Last office visit: 09/23/20 ([place])  Scr: 0.84 (10/06/20)  Age: 71 Weight: 60.8kg  Appropriate dose and refill sent to requested pharmacy.

## 2021-04-23 ENCOUNTER — Other Ambulatory Visit: Payer: Self-pay | Admitting: Internal Medicine

## 2021-04-23 DIAGNOSIS — I2510 Atherosclerotic heart disease of native coronary artery without angina pectoris: Secondary | ICD-10-CM

## 2021-04-23 DIAGNOSIS — I10 Essential (primary) hypertension: Secondary | ICD-10-CM

## 2021-05-21 DIAGNOSIS — L309 Dermatitis, unspecified: Secondary | ICD-10-CM | POA: Diagnosis not present

## 2021-05-21 DIAGNOSIS — Z85828 Personal history of other malignant neoplasm of skin: Secondary | ICD-10-CM | POA: Diagnosis not present

## 2021-05-21 DIAGNOSIS — L820 Inflamed seborrheic keratosis: Secondary | ICD-10-CM | POA: Diagnosis not present

## 2021-05-21 DIAGNOSIS — L111 Transient acantholytic dermatosis [Grover]: Secondary | ICD-10-CM | POA: Diagnosis not present

## 2021-07-02 ENCOUNTER — Other Ambulatory Visit: Payer: Self-pay | Admitting: Internal Medicine

## 2021-07-02 DIAGNOSIS — J431 Panlobular emphysema: Secondary | ICD-10-CM

## 2021-07-13 ENCOUNTER — Encounter: Payer: Self-pay | Admitting: Internal Medicine

## 2021-07-13 ENCOUNTER — Ambulatory Visit: Payer: Medicare PPO | Admitting: Internal Medicine

## 2021-07-13 VITALS — BP 132/68 | HR 70 | Temp 98.0°F | Resp 16 | Ht 68.0 in | Wt 132.0 lb

## 2021-07-13 DIAGNOSIS — E039 Hypothyroidism, unspecified: Secondary | ICD-10-CM | POA: Diagnosis not present

## 2021-07-13 DIAGNOSIS — I10 Essential (primary) hypertension: Secondary | ICD-10-CM | POA: Diagnosis not present

## 2021-07-13 DIAGNOSIS — H6123 Impacted cerumen, bilateral: Secondary | ICD-10-CM

## 2021-07-13 LAB — CBC WITH DIFFERENTIAL/PLATELET
Basophils Absolute: 0 10*3/uL (ref 0.0–0.1)
Basophils Relative: 0.4 % (ref 0.0–3.0)
Eosinophils Absolute: 0.1 10*3/uL (ref 0.0–0.7)
Eosinophils Relative: 0.9 % (ref 0.0–5.0)
HCT: 41.3 % (ref 39.0–52.0)
Hemoglobin: 14.1 g/dL (ref 13.0–17.0)
Lymphocytes Relative: 43.2 % (ref 12.0–46.0)
Lymphs Abs: 2.9 10*3/uL (ref 0.7–4.0)
MCHC: 34.1 g/dL (ref 30.0–36.0)
MCV: 92.9 fl (ref 78.0–100.0)
Monocytes Absolute: 0.7 10*3/uL (ref 0.1–1.0)
Monocytes Relative: 10.1 % (ref 3.0–12.0)
Neutro Abs: 3.1 10*3/uL (ref 1.4–7.7)
Neutrophils Relative %: 45.4 % (ref 43.0–77.0)
Platelets: 203 10*3/uL (ref 150.0–400.0)
RBC: 4.44 Mil/uL (ref 4.22–5.81)
RDW: 12.9 % (ref 11.5–15.5)
WBC: 6.8 10*3/uL (ref 4.0–10.5)

## 2021-07-13 LAB — BASIC METABOLIC PANEL
BUN: 16 mg/dL (ref 6–23)
CO2: 26 mEq/L (ref 19–32)
Calcium: 9.1 mg/dL (ref 8.4–10.5)
Chloride: 104 mEq/L (ref 96–112)
Creatinine, Ser: 0.81 mg/dL (ref 0.40–1.50)
GFR: 87.82 mL/min (ref >60.00)
Glucose, Bld: 122 mg/dL — ABNORMAL HIGH (ref 70–99)
Potassium: 3.9 mEq/L (ref 3.5–5.1)
Sodium: 139 mEq/L (ref 135–145)

## 2021-07-13 LAB — TSH: TSH: 4.01 u[IU]/mL (ref 0.35–5.50)

## 2021-07-13 NOTE — Progress Notes (Signed)
Patient consent obtained. Irrigation with water and peroxide performed. Full view of tympanic membranes after procedure.  Patient tolerated procedure well.   

## 2021-07-13 NOTE — Patient Instructions (Signed)

## 2021-07-13 NOTE — Progress Notes (Signed)
? ?Subjective:  ?Patient ID: Roy Long, male    DOB: 10-03-1950  Age: 71 y.o. MRN: [id number] ? ?CC: Hypertension and Hypothyroidism ? ? ?HPI ?Roy Long presents for f/up -  ? ?He complains of a several month history of decreased hearing in both ears, worse on the left than the right.  He is active and denies chest pain, shortness of breath, or diaphoresis.  He has had some fatigue but denies palpitations or edema. ? ?Outpatient Medications Prior to Visit  ?Medication Sig Dispense Refill  ? acyclovir (ZOVIRAX) 400 MG tablet TAKE 1 TABLET 3 TIMES A DAY 270 tablet 1  ? atorvastatin (LIPITOR) 80 MG tablet TAKE 1 TABLET BY MOUTH EVERY DAY 90 tablet 3  ? cholecalciferol (VITAMIN D) 1000 UNITS tablet Take 1,000 Units by mouth daily.    ? cycloSPORINE (RESTASIS) 0.05 % ophthalmic emulsion Place 1 drop into both eyes 2 (two) times daily.    ? diltiazem (CARDIZEM CD) 240 MG 24 hr capsule TAKE 1 CAPSULE BY MOUTH EVERY DAY 90 capsule 3  ? ELIQUIS 5 MG TABS tablet TAKE 1 TABLET BY MOUTH TWICE A DAY 180 tablet 1  ? finasteride (PROSCAR) 5 MG tablet TAKE 1 TABLET BY MOUTH EVERY DAY 90 tablet 1  ? fluticasone (FLONASE) 50 MCG/ACT nasal spray Place 2 sprays into both nostrils daily. 48 g 1  ? hydrALAZINE (APRESOLINE) 50 MG tablet TAKE 1 TABLET BY MOUTH THREE TIMES A DAY 270 tablet 1  ? levothyroxine (SYNTHROID) 25 MCG tablet TAKE 1 TABLET BY MOUTH EVERY DAY BEFORE BREAKFAST 90 tablet 1  ? montelukast (SINGULAIR) 10 MG tablet TAKE 1 TABLET BY MOUTH EVERYDAY AT BEDTIME 90 tablet 1  ? Multiple Vitamins-Minerals (PRESERVISION AREDS PO) Take 1 tablet by mouth daily.    ? omeprazole (PRILOSEC) 40 MG capsule TAKE 1 CAPSULE BY MOUTH EVERY DAY 90 capsule 1  ? SPIRIVA RESPIMAT 2.5 MCG/ACT AERS INHALE 2 PUFFS BY MOUTH INTO THE LUNGS DAILY 12 g 1  ? tamsulosin (FLOMAX) 0.4 MG CAPS capsule TAKE 1 CAPSULE BY MOUTH EVERY DAY 90 capsule 1  ? TRINTELLIX 10 MG TABS tablet TAKE 1 TABLET BY MOUTH EVERY DAY 90 tablet 1  ? ?No  facility-administered medications prior to visit.  ? ? ?ROS ?Review of Systems  ?Constitutional:  Positive for fatigue. Negative for appetite change, chills, diaphoresis and unexpected weight change.  ?HENT:  Positive for hearing loss. Negative for sinus pressure, sore throat and trouble swallowing.   ?Eyes: Negative.   ?Respiratory:  Negative for cough, chest tightness, shortness of breath and wheezing.   ?Cardiovascular:  Negative for chest pain, palpitations and leg swelling.  ?Gastrointestinal:  Negative for abdominal pain, constipation, diarrhea, nausea and vomiting.  ?Endocrine: Negative.  Negative for cold intolerance and heat intolerance.  ?Genitourinary: Negative.  Negative for difficulty urinating.  ?Musculoskeletal: Negative.   ?Skin: Negative.   ?Neurological:  Negative for dizziness, weakness, light-headedness and headaches.  ?Hematological:  Negative for adenopathy. Does not bruise/bleed easily.  ?Psychiatric/Behavioral: Negative.    ? ?Objective:  ?BP 132/68 (BP Location: Left Arm, Patient Position: Sitting, Cuff Size: Large)   Pulse 70   Temp 98 ?F (36.7 ?C) (Oral)   Resp 16   Ht '5\' 8"'$  (1.727 m)   Wt 132 lb (59.9 kg)   SpO2 97%   BMI 20.07 kg/m?  ? ?BP Readings from Last 3 Encounters:  ?07/13/21 132/68  ?01/07/21 130/76  ?10/06/20 132/70  ? ? ?Wt Readings from Last 3 Encounters:  ?  07/13/21 132 lb (59.9 kg)  ?01/07/21 134 lb (60.8 kg)  ?10/06/20 135 lb (61.2 kg)  ? ? ?Physical Exam ?Vitals reviewed.  ?HENT:  ?   Right Ear: Tympanic membrane and external ear normal. Decreased hearing noted. No middle ear effusion. There is impacted cerumen.  ?   Left Ear: Tympanic membrane and external ear normal. Decreased hearing noted.  No middle ear effusion. There is impacted cerumen.  ?   Ears:  ?   Comments: Both areas were irrigated with water and the cerumen was successfully removed.  He tolerated this well.  Examination afterwards is normal. ?   Mouth/Throat:  ?   Mouth: Mucous membranes are moist.   ?Eyes:  ?   General: No scleral icterus. ?   Conjunctiva/sclera: Conjunctivae normal.  ?Cardiovascular:  ?   Rate and Rhythm: Normal rate and regular rhythm.  ?   Heart sounds: No murmur heard. ?Pulmonary:  ?   Effort: Pulmonary effort is normal.  ?   Breath sounds: No stridor. No wheezing, rhonchi or rales.  ?Abdominal:  ?   General: Abdomen is flat.  ?   Palpations: There is no mass.  ?   Tenderness: There is no abdominal tenderness. There is no guarding.  ?   Hernia: No hernia is present.  ?Musculoskeletal:     ?   General: Normal range of motion.  ?   Cervical back: Neck supple.  ?   Right lower leg: No edema.  ?   Left lower leg: No edema.  ?Lymphadenopathy:  ?   Cervical: No cervical adenopathy.  ?Skin: ?   General: Skin is warm and dry.  ?   Findings: No rash.  ?Neurological:  ?   General: No focal deficit present.  ?   Mental Status: Mental status is at baseline.  ?Psychiatric:     ?   Mood and Affect: Mood normal.     ?   Behavior: Behavior normal.  ? ? ?Lab Results  ?Component Value Date  ? WBC 6.8 07/13/2021  ? HGB 14.1 07/13/2021  ? HCT 41.3 07/13/2021  ? PLT 203.0 07/13/2021  ? GLUCOSE 122 (H) 07/13/2021  ? CHOL 91 10/06/2020  ? TRIG 150.0 (H) 10/06/2020  ? HDL 39.90 10/06/2020  ? [place] 21 10/06/2020  ? ALT 15 10/06/2020  ? AST 18 10/06/2020  ? NA 139 07/13/2021  ? K 3.9 07/13/2021  ? CL 104 07/13/2021  ? CREATININE 0.81 07/13/2021  ? BUN 16 07/13/2021  ? CO2 26 07/13/2021  ? TSH 4.01 07/13/2021  ? PSA 0.13 10/09/2020  ? INR 1.0 04/15/2011  ? HGBA1C 5.5 04/18/2020  ? ? ?No results found. ? ?Assessment & Plan:  ? ?Roy Long was seen today for hypertension and hypothyroidism. ? ?Diagnoses and all orders for this visit: ? ?Acquired hypothyroidism- His TSH is in the normal range.  He will stay on the current T4 dosage. ?-     TSH; Future ?-     CBC with Differential/Platelet; Future ?-     CBC with Differential/Platelet ?-     TSH ? ?Essential hypertension- His blood pressure is adequately well  controlled. ?-     Basic metabolic panel; Future ?-     CBC with Differential/Platelet; Future ?-     CBC with Differential/Platelet ?-     Basic metabolic panel ? ?Hearing loss due to cerumen impaction, bilateral- His hearing returned to normal after the cerumen was removed. ? ? ?I am having Roy L.  Long maintain his cycloSPORINE, cholecalciferol, Multiple Vitamins-Minerals (PRESERVISION AREDS PO), fluticasone, acyclovir, tamsulosin, diltiazem, montelukast, atorvastatin, finasteride, omeprazole, Trintellix, levothyroxine, Eliquis, hydrALAZINE, and Spiriva Respimat. ? ?No orders of the defined types were placed in this encounter. ? ? ? ?Follow-up: Return in about 6 months (around 01/12/2022). ? ?Roy Calico, MD ?

## 2021-08-07 ENCOUNTER — Other Ambulatory Visit: Payer: Self-pay | Admitting: *Deleted

## 2021-08-07 MED ORDER — FINASTERIDE 5 MG PO TABS
5.0000 mg | ORAL_TABLET | Freq: Every day | ORAL | 0 refills | Status: DC
Start: 2021-08-07 — End: 2021-08-10

## 2021-08-07 NOTE — Telephone Encounter (Signed)
Please refill as per office routine med refill policy (all routine meds to be refilled for 3 mo or monthly (per pt preference) up to one year from last visit, then month to month grace period for 3 mo, then further med refills will have to be denied) ? ?

## 2021-08-07 NOTE — Telephone Encounter (Signed)
Message sent in provider absence. Patient is requesting a refill. ?

## 2021-08-10 ENCOUNTER — Other Ambulatory Visit: Payer: Self-pay

## 2021-08-10 MED ORDER — FINASTERIDE 5 MG PO TABS
5.0000 mg | ORAL_TABLET | Freq: Every day | ORAL | 1 refills | Status: DC
Start: 2021-08-10 — End: 2022-04-02

## 2021-09-14 DIAGNOSIS — L814 Other melanin hyperpigmentation: Secondary | ICD-10-CM | POA: Diagnosis not present

## 2021-09-14 DIAGNOSIS — L821 Other seborrheic keratosis: Secondary | ICD-10-CM | POA: Diagnosis not present

## 2021-09-14 DIAGNOSIS — Z85828 Personal history of other malignant neoplasm of skin: Secondary | ICD-10-CM | POA: Diagnosis not present

## 2021-09-14 DIAGNOSIS — D485 Neoplasm of uncertain behavior of skin: Secondary | ICD-10-CM | POA: Diagnosis not present

## 2021-09-14 DIAGNOSIS — L570 Actinic keratosis: Secondary | ICD-10-CM | POA: Diagnosis not present

## 2021-09-14 DIAGNOSIS — L720 Epidermal cyst: Secondary | ICD-10-CM | POA: Diagnosis not present

## 2021-09-24 DIAGNOSIS — N401 Enlarged prostate with lower urinary tract symptoms: Secondary | ICD-10-CM | POA: Diagnosis not present

## 2021-09-24 DIAGNOSIS — N200 Calculus of kidney: Secondary | ICD-10-CM | POA: Diagnosis not present

## 2021-09-24 DIAGNOSIS — R351 Nocturia: Secondary | ICD-10-CM | POA: Diagnosis not present

## 2021-09-24 DIAGNOSIS — R3121 Asymptomatic microscopic hematuria: Secondary | ICD-10-CM | POA: Diagnosis not present

## 2021-09-30 ENCOUNTER — Other Ambulatory Visit: Payer: Self-pay | Admitting: Internal Medicine

## 2021-09-30 DIAGNOSIS — E039 Hypothyroidism, unspecified: Secondary | ICD-10-CM

## 2021-10-01 ENCOUNTER — Ambulatory Visit (INDEPENDENT_AMBULATORY_CARE_PROVIDER_SITE_OTHER): Payer: Medicare PPO

## 2021-10-01 ENCOUNTER — Other Ambulatory Visit: Payer: Self-pay | Admitting: Internal Medicine

## 2021-10-01 DIAGNOSIS — Z0000 Encounter for general adult medical examination without abnormal findings: Secondary | ICD-10-CM

## 2021-10-01 DIAGNOSIS — J301 Allergic rhinitis due to pollen: Secondary | ICD-10-CM

## 2021-10-01 NOTE — Patient Instructions (Signed)
Roy Long , Thank you for taking time to come for your Medicare Wellness Visit. I appreciate your ongoing commitment to your health goals. Please review the following plan we discussed and let me know if I can assist you in the future.   Screening recommendations/referrals: Colonoscopy: 10/03/2015; due every 10 years Recommended yearly ophthalmology/optometry visit for glaucoma screening and checkup Recommended yearly dental visit for hygiene and checkup  Vaccinations: Influenza vaccine: 12/04/2020 Pneumococcal vaccine: 04/07/2011 (Pneumococcal 23) 01/07/2021 (Prevnar 13), 01/07/2021 (Prevnar 20) Tdap vaccine: 02/16/2012; due every 10 years Shingles vaccine: 10/17/2020, 03/31/2021   Covid-19: 05/10/2019, 06/04/2019, 01/08/2020, 06/27/2020, 12/10/2020, 08/04/2021  Advanced directives: Yes; Please bring a copy of your health care power of attorney and living will to the office at your convenience.  Conditions/risks identified: Yes; Client understands the importance of follow-up appointments with providers by attending scheduled visits and discussed goals to eat healthier, increase physical activity 5 times a week for 30 minutes each, exercise the brain by doing stimulating brain exercises (reading, adult coloring, crafting, listening to music, puzzles, etc.), socialize and enjoy life more, get enough sleep at least 8-9 hours average per night and make time for laughter.  Next appointment: Please schedule your next Medicare Wellness Visit with your Nurse Health Advisor in 1 year by calling [phone].  Preventive Care 73 Years and Older, Male Preventive care refers to lifestyle choices and visits with your health care provider that can promote health and wellness. What does preventive care include? A yearly physical exam. This is also called an annual well check. Dental exams once or twice a year. Routine eye exams. Ask your health care provider how often you should have your eyes checked. Personal  lifestyle choices, including: Daily care of your teeth and gums. Regular physical activity. Eating a healthy diet. Avoiding tobacco and drug use. Limiting alcohol use. Practicing safe sex. Taking low doses of aspirin every day. Taking vitamin and mineral supplements as recommended by your health care provider. What happens during an annual well check? The services and screenings done by your health care provider during your annual well check will depend on your age, overall health, lifestyle risk factors, and family history of disease. Counseling  Your health care provider may ask you questions about your: Alcohol use. Tobacco use. Drug use. Emotional well-being. Home and relationship well-being. Sexual activity. Eating habits. History of falls. Memory and ability to understand (cognition). Work and work Statistician. Screening  You may have the following tests or measurements: Height, weight, and BMI. Blood pressure. Lipid and cholesterol levels. These may be checked every 5 years, or more frequently if you are over 58 years old. Skin check. Lung cancer screening. You may have this screening every year starting at age 76 if you have a 30-pack-year history of smoking and currently smoke or have quit within the past 15 years. Fecal occult blood test (FOBT) of the stool. You may have this test every year starting at age 36. Flexible sigmoidoscopy or colonoscopy. You may have a sigmoidoscopy every 5 years or a colonoscopy every 10 years starting at age 63. Prostate cancer screening. Recommendations will vary depending on your family history and other risks. Hepatitis C blood test. Hepatitis B blood test. Sexually transmitted disease (STD) testing. Diabetes screening. This is done by checking your blood sugar (glucose) after you have not eaten for a while (fasting). You may have this done every 1-3 years. Abdominal aortic aneurysm (AAA) screening. You may need this if you are a  current or  former smoker. Osteoporosis. You may be screened starting at age 13 if you are at high risk. Talk with your health care provider about your test results, treatment options, and if necessary, the need for more tests. Vaccines  Your health care provider may recommend certain vaccines, such as: Influenza vaccine. This is recommended every year. Tetanus, diphtheria, and acellular pertussis (Tdap, Td) vaccine. You may need a Td booster every 10 years. Zoster vaccine. You may need this after age 68. Pneumococcal 13-valent conjugate (PCV13) vaccine. One dose is recommended after age 108. Pneumococcal polysaccharide (PPSV23) vaccine. One dose is recommended after age 87. Talk to your health care provider about which screenings and vaccines you need and how often you need them. This information is not intended to replace advice given to you by your health care provider. Make sure you discuss any questions you have with your health care provider. Document Released: 04/11/2015 Document Revised: 12/03/2015 Document Reviewed: 01/14/2015 Elsevier Interactive Patient Education  2017 [place] Prevention in the Home Falls can cause injuries. They can happen to people of all ages. There are many things you can do to make your home safe and to help prevent falls. What can I do on the outside of my home? Regularly fix the edges of walkways and driveways and fix any cracks. Remove anything that might make you trip as you walk through a door, such as a raised step or threshold. Trim any bushes or trees on the path to your home. Use bright outdoor lighting. Clear any walking paths of anything that might make someone trip, such as rocks or tools. Regularly check to see if handrails are loose or broken. Make sure that both sides of any steps have handrails. Any raised decks and porches should have guardrails on the edges. Have any leaves, snow, or ice cleared regularly. Use sand or salt on  walking paths during winter. Clean up any spills in your garage right away. This includes oil or grease spills. What can I do in the bathroom? Use night lights. Install grab bars by the toilet and in the tub and shower. Do not use towel bars as grab bars. Use non-skid mats or decals in the tub or shower. If you need to sit down in the shower, use a plastic, non-slip stool. Keep the floor dry. Clean up any water that spills on the floor as soon as it happens. Remove soap buildup in the tub or shower regularly. Attach bath mats securely with double-sided non-slip rug tape. Do not have throw rugs and other things on the floor that can make you trip. What can I do in the bedroom? Use night lights. Make sure that you have a light by your bed that is easy to reach. Do not use any sheets or blankets that are too big for your bed. They should not hang down onto the floor. Have a firm chair that has side arms. You can use this for support while you get dressed. Do not have throw rugs and other things on the floor that can make you trip. What can I do in the kitchen? Clean up any spills right away. Avoid walking on wet floors. Keep items that you use a lot in easy-to-reach places. If you need to reach something above you, use a strong step stool that has a grab bar. Keep electrical cords out of the way. Do not use floor polish or wax that makes floors slippery. If you must use wax, use non-skid  floor wax. Do not have throw rugs and other things on the floor that can make you trip. What can I do with my stairs? Do not leave any items on the stairs. Make sure that there are handrails on both sides of the stairs and use them. Fix handrails that are broken or loose. Make sure that handrails are as long as the stairways. Check any carpeting to make sure that it is firmly attached to the stairs. Fix any carpet that is loose or worn. Avoid having throw rugs at the top or bottom of the stairs. If you do  have throw rugs, attach them to the floor with carpet tape. Make sure that you have a light switch at the top of the stairs and the bottom of the stairs. If you do not have them, ask someone to add them for you. What else can I do to help prevent falls? Wear shoes that: Do not have high heels. Have rubber bottoms. Are comfortable and fit you well. Are closed at the toe. Do not wear sandals. If you use a stepladder: Make sure that it is fully opened. Do not climb a closed stepladder. Make sure that both sides of the stepladder are locked into place. Ask someone to hold it for you, if possible. Clearly mark and make sure that you can see: Any grab bars or handrails. First and last steps. Where the edge of each step is. Use tools that help you move around (mobility aids) if they are needed. These include: Canes. Walkers. Scooters. Crutches. Turn on the lights when you go into a dark area. Replace any light bulbs as soon as they burn out. Set up your furniture so you have a clear path. Avoid moving your furniture around. If any of your floors are uneven, fix them. If there are any pets around you, be aware of where they are. Review your medicines with your doctor. Some medicines can make you feel dizzy. This can increase your chance of falling. Ask your doctor what other things that you can do to help prevent falls. This information is not intended to replace advice given to you by your health care provider. Make sure you discuss any questions you have with your health care provider. Document Released: 01/09/2009 Document Revised: 08/21/2015 Document Reviewed: 04/19/2014 Elsevier Interactive Patient Education  2017 [employer].

## 2021-10-01 NOTE — Progress Notes (Signed)
I connected with Roy Long today by telephone and verified that I am speaking with the correct person using two identifiers. Location patient: home Location provider: work Persons participating in the virtual visit: patient, provider.   I discussed the limitations, risks, security and privacy concerns of performing an evaluation and management service by telephone and the availability of in person appointments. I also discussed with the patient that there may be a patient responsible charge related to this service. The patient expressed understanding and verbally consented to this telephonic visit.    Interactive audio and video telecommunications were attempted between this provider and patient, however failed, due to patient having technical difficulties OR patient did not have access to video capability.  We continued and completed visit with audio only.  Some vital signs may be absent or patient reported.   Time Spent with patient on telephone encounter: 30 minutes  Subjective:   Roy Long is a 71 y.o. male who presents for Medicare Annual/Subsequent preventive examination.  Review of Systems     Cardiac Risk Factors include: advanced age (>42mn, >>59women);dyslipidemia;male gender;hypertension;family history of premature cardiovascular disease     Objective:    There were no vitals filed for this visit. There is no height or weight on file to calculate BMI.     10/01/2021   10:06 AM 04/25/2018   10:18 AM 09/27/2016    2:18 PM 04/12/2016    9:19 AM 03/24/2016    8:01 PM 10/13/2015    9:04 AM 09/17/2015   10:51 AM  Advanced Directives  Does Patient Have a Medical Advance Directive? Yes Yes Yes No Yes Yes Yes  Type of Advance Directive Living will;Healthcare Power of [place]   [place] will  Does patient want to make changes  to medical advance directive? No - Patient declined Yes (ED - Information included in AVS)       Copy of [place] Chart? No - copy requested Yes - validated most recent copy scanned in chart (See row information)   Yes    Would patient like information on creating a medical advance directive?    No - Patient declined       Current Medications (verified) Outpatient Encounter Medications as of 10/01/2021  Medication Sig   Fluorouracil 5 % SOLN Apply topically.   acyclovir (ZOVIRAX) 400 MG tablet TAKE 1 TABLET 3 TIMES A DAY   atorvastatin (LIPITOR) 80 MG tablet TAKE 1 TABLET BY MOUTH EVERY DAY   cholecalciferol (VITAMIN D) 1000 UNITS tablet Take 1,000 Units by mouth daily.   cycloSPORINE (RESTASIS) 0.05 % ophthalmic emulsion Place 1 drop into both eyes 2 (two) times daily.   diltiazem (CARDIZEM CD) 240 MG 24 hr capsule TAKE 1 CAPSULE BY MOUTH EVERY DAY   ELIQUIS 5 MG TABS tablet TAKE 1 TABLET BY MOUTH TWICE A DAY   finasteride (PROSCAR) 5 MG tablet Take 1 tablet (5 mg total) by mouth daily.   fluticasone (FLONASE) 50 MCG/ACT nasal spray Place 2 sprays into both nostrils daily.   hydrALAZINE (APRESOLINE) 50 MG tablet TAKE 1 TABLET BY MOUTH THREE TIMES A DAY   levothyroxine (SYNTHROID) 25 MCG tablet TAKE 1 TABLET BY MOUTH EVERY DAY BEFORE BREAKFAST   montelukast (SINGULAIR) 10 MG tablet TAKE 1 TABLET BY MOUTH EVERYDAY AT BEDTIME   Multiple Vitamins-Minerals (PRESERVISION AREDS PO) Take 1 tablet by mouth daily.  omeprazole (PRILOSEC) 40 MG capsule TAKE 1 CAPSULE BY MOUTH EVERY DAY   SPIRIVA RESPIMAT 2.5 MCG/ACT AERS INHALE 2 PUFFS BY MOUTH INTO THE LUNGS DAILY   tamsulosin (FLOMAX) 0.4 MG CAPS capsule TAKE 1 CAPSULE BY MOUTH EVERY DAY   TRINTELLIX 10 MG TABS tablet TAKE 1 TABLET BY MOUTH EVERY DAY   No facility-administered encounter medications on file as of 10/01/2021.    Allergies (verified) Pneumococcal vaccines, Tape, and Amlodipine   History: Past Medical History:   Diagnosis Date   Age-related macular degeneration, dry, left eye    Alcohol dependence ([place])    Arthritis    "maybe a little bit in my left knee and right forefinger" (05/30/2015)   Atrial fibrillation ([place])    ablation 05-2015 with no reoccurance as of 09-2015   Basal cell carcinoma    Depression    hx   Family history of adverse reaction to anesthesia    "father had head injuries and dementia; everytime he was put under less of his mentation came back"   Gastroparesis    GERD (gastroesophageal reflux disease)    History of kidney stones    Hypercholesterolemia dx'd 02/2015   Hypertension    Hypothyroidism    Kidney stones    Migraine aura without headache    "very rare now" (05/30/2015)   Pneumonia ~ 2004   Squamous carcinoma    hand, chest   Past Surgical History:  Procedure Laterality Date   ANTERIOR CERVICAL DECOMP/DISCECTOMY FUSION  04/2009   ATRIAL FIBRILLATION ABLATION  05/30/2015   BACK SURGERY     BASAL CELL CARCINOMA EXCISION     chest or back or left arm   CARDIOVERSION  06/2002   CATARACT EXTRACTION W/ INTRAOCULAR LENS IMPLANT Left ~ 2012   COLONOSCOPY     COLONOSCOPY W/ POLYPECTOMY  02/2003   CYSTOSCOPY W/ STONE MANIPULATION  1980's   "basketted it out; no stent"   ELECTROPHYSIOLOGIC STUDY N/A 05/30/2015   Procedure: Atrial Fibrillation Ablation;  Surgeon: Will Meredith Leeds, MD;  Location: [facility];  Service: Cardiovascular;  Laterality: N/A;   EXTRACORPOREAL SHOCK WAVE LITHOTRIPSY Left 04/12/2016   Procedure: LEFT EXTRACORPOREAL SHOCK WAVE LITHOTRIPSY (ESWL);  Surgeon: Irine Seal, MD;  Location: [facility];  Service: Urology;  Laterality: Left;   INGUINAL HERNIA REPAIR Bilateral 2004   INGUINAL HERNIA REPAIR Bilateral 2004   MOHS SURGERY Right    "temple"   SQUAMOUS CELL CARCINOMA EXCISION     chest or back or left arm   Family History  Problem Relation Age of Onset   Stroke Brother    Cancer Brother        basosquamous cell carcinoma-head   Heart  failure Mother    Colon cancer Cousin 56   Heart disease Neg Hx    Hyperlipidemia Neg Hx    Hypertension Neg Hx    Diabetes Neg Hx    Social History   Socioeconomic History   Marital status: Single    Spouse name: Not on file   Number of children: 0   Years of education: Not on file   Highest education level: Not on file  Occupational History   Occupation: band teacher    Comment: retired  Tobacco Use   Smoking status: Former    Packs/day: 3.00    Years: 17.00    Total pack years: 51.00    Types: Cigarettes    Quit date: 03/30/1983    Years since quitting: 39.5  Passive exposure: Never   Smokeless tobacco: Never  Vaping Use   Vaping Use: Never used  Substance and Sexual Activity   Alcohol use: No    Alcohol/week: 0.0 standard drinks of alcohol    Comment: 05/30/2015 "recovering alcoholic; dry date 10/02/2374"   Drug use: No    Types: Marijuana, LSD, Mescaline    Comment: 03/07/2015 "stopped all drugs back in the 1980's"   Sexual activity: Not Currently    Birth control/protection: None  Other Topics Concern   Not on file  Social History Narrative   Not on file   Social Determinants of Health   Financial Resource Strain: Low Risk  (10/01/2021)   Overall Financial Resource Strain (CARDIA)    Difficulty of Paying Living Expenses: Not hard at all  Food Insecurity: No Food Insecurity (10/01/2021)   Hunger Vital Sign    Worried About Running Out of Food in the Last Year: Never true    Ran Out of Food in the Last Year: Never true  Transportation Needs: No Transportation Needs (10/01/2021)   PRAPARE - Hydrologist (Medical): No    Lack of Transportation (Non-Medical): No  Physical Activity: Sufficiently Active (10/01/2021)   Exercise Vital Sign    Days of Exercise per Week: 5 days    Minutes of Exercise per Session: 60 min  Stress: No Stress Concern Present (10/01/2021)   [place]     Feeling of Stress : Not at all  Social Connections: Moderately Isolated (10/01/2021)   Social Connection and Isolation Panel [NHANES]    Frequency of Communication with Friends and Family: More than three times a week    Frequency of Social Gatherings with Friends and Family: Three times a week    Attends Religious Services: Never    Active Member of Clubs or Organizations: Yes    Attends Music therapist: More than 4 times per year    Marital Status: Never married    Tobacco Counseling Counseling given: Not Answered   Clinical Intake:  Pre-visit preparation completed: Yes  Pain : No/denies pain     BMI - recorded: 20.08 Nutritional Status: BMI of 19-24  Normal Nutritional Risks: None Diabetes: No  How often do you need to have someone help you when you read instructions, pamphlets, or other written materials from your doctor or pharmacy?: 1 - Never What is the last grade level you completed in school?: Master's Degree  Diabetic? no  Interpreter Needed?: No  Information entered by :: Lisette Abu, LPN   Activities of Daily Living    10/01/2021   10:14 AM 10/06/2020    2:52 PM  In your present state of health, do you have any difficulty performing the following activities:  Hearing? 0 0  Vision? 0 0  Difficulty concentrating or making decisions? 0 0  Walking or climbing stairs? 0 0  Dressing or bathing? 0 0  Doing errands, shopping? 0 0  Preparing Food and eating ? N   Using the Toilet? N   In the past six months, have you accidently leaked urine? N   Do you have problems with loss of bowel control? N   Managing your Medications? N   Managing your Finances? N   Housekeeping or managing your Housekeeping? N     Patient Care Team: Janith Lima, MD as PCP - General (Internal Medicine) Constance Haw, MD as PCP - Electrophysiology (Cardiology) Darleen Crocker,  MD as Consulting Physician (Ophthalmology)  Indicate any recent Medical  Services you may have received from other than [facility] providers in the past year (date may be approximate).     Assessment:   This is a routine wellness examination for Roy Long.  Hearing/Vision screen Hearing Screening - Comments:: Patient denied any hearing difficulty.   No hearing aids.  Vision Screening - Comments:: Patient does wear corrective lenses/contacts.  Eye exam done by: Darleen Crocker, MD.   Dietary issues and exercise activities discussed: Current Exercise Habits: Home exercise routine;Structured exercise class, Time (Minutes): 60, Frequency (Times/Week): 5, Weekly Exercise (Minutes/Week): 300, Intensity: Moderate, Exercise limited by: None identified   Goals Addressed             This Visit's Progress    My goal is to stay sober and alive.        Depression Screen    10/01/2021   10:11 AM 07/13/2021    1:41 PM 10/06/2020    2:50 PM 04/18/2020    1:01 PM 06/08/2019   10:24 AM 10/24/2018    4:39 PM 04/25/2018   10:27 AM  PHQ 2/9 Scores  PHQ - 2 Score '2 2 1 '$ 0 0 2 1  PHQ- 9 Score '4 2 6 '$ 0 0 4 4    Fall Risk    10/01/2021   10:06 AM 10/06/2020    2:52 PM 06/08/2019   10:24 AM 04/25/2018   10:27 AM 04/22/2017   10:38 AM  Fall Risk   Falls in the past year? 0 0 0 0 No  Number falls in past yr: 0 0  0   Injury with Fall? 0 0  0   Risk for fall due to : No Fall Risks      Follow up Falls evaluation completed   Falls evaluation completed     FALL RISK PREVENTION PERTAINING TO THE HOME:  Any stairs in or around the home? No  If so, are there any without handrails? No  Home free of loose throw rugs in walkways, pet beds, electrical cords, etc? Yes  Adequate lighting in your home to reduce risk of falls? Yes   ASSISTIVE DEVICES UTILIZED TO PREVENT FALLS:  Life alert? No  Use of a cane, walker or w/c? No  Grab bars in the bathroom? Yes  Shower chair or bench in shower? Yes  Elevated toilet seat or a handicapped toilet? No   TIMED UP AND GO:  Was the test  performed? No .  Length of time to ambulate 10 feet: n/a sec.   Appearance of gait: Gait not evaluated during this visit.  Cognitive Function:        10/01/2021   10:15 AM  6CIT Screen  What Year? 0 points  What month? 0 points  What time? 0 points  Count back from 20 0 points  Months in reverse 0 points  Repeat phrase 0 points  Total Score 0 points    Immunizations Immunization History  Administered Date(s) Administered   DTaP 11/16/2006   Influenza Split 01/18/2012   Influenza, High Dose Seasonal PF 12/28/2013, 11/17/2016, 11/16/2018, 12/19/2019, 12/03/2020   Influenza,trivalent, recombinat, inj, PF 11/16/2017   Influenza-Unspecified 01/07/2016, 12/19/2019, 12/04/2020   PFIZER Comirnaty(Gray Top)Covid-19 Tri-Sucrose Vaccine 06/27/2020   PFIZER(Purple Top)SARS-COV-2 Vaccination 05/10/2019, 06/04/2019, 01/08/2020, 06/27/2020, 12/10/2020   PNEUMOCOCCAL CONJUGATE-20 01/07/2021   Pfizer Covid-19 Vaccine Bivalent Booster 31yr & up 08/04/2021   Pneumococcal Polysaccharide-23 04/07/2011   Tdap 02/16/2012   Zoster Recombinat (Shingrix) 10/17/2020, 03/31/2021  Zoster, Live 05/04/2011    TDAP status: Up to date  Flu Vaccine status: Up to date  Pneumococcal vaccine status: Up to date  Covid-19 vaccine status: Completed vaccines  Qualifies for Shingles Vaccine? Yes   Zostavax completed Yes   Shingrix Completed?: Yes  Screening Tests Health Maintenance  Topic Date Due   INFLUENZA VACCINE  10/27/2021   TETANUS/TDAP  02/15/2022   COLONOSCOPY (Pts 45-71yr Insurance coverage will need to be confirmed)  10/02/2025   Pneumonia Vaccine 71 Years old  Completed   COVID-19 Vaccine  Completed   Hepatitis C Screening  Completed   Zoster Vaccines- Shingrix  Completed   HPV VACCINES  Aged Out    Health Maintenance  There are no preventive care reminders to display for this patient.  Colorectal cancer screening: Type of screening: Colonoscopy. Completed 10/03/2015. Repeat  every 10 years  Lung Cancer Screening: (Low Dose CT Chest recommended if Age 73-80years, 30 pack-year currently smoking OR have quit w/in 15years.) does not qualify.   Lung Cancer Screening Referral: no  Additional Screening:  Hepatitis C Screening: does qualify; Completed 10/24/2013  Vision Screening: Recommended annual ophthalmology exams for early detection of glaucoma and other disorders of the eye. Is the patient up to date with their annual eye exam?  Yes  Who is the provider or what is the name of the office in which the patient attends annual eye exams? TDarleen Crocker MD. If pt is not established with a provider, would they like to be referred to a provider to establish care? No .   Dental Screening: Recommended annual dental exams for proper oral hygiene  Community Resource Referral / Chronic Care Management: CRR required this visit?  No   CCM required this visit?  No      Plan:     I have personally reviewed and noted the following in the patient's chart:   Medical and social history Use of alcohol, tobacco or illicit drugs  Current medications and supplements including opioid prescriptions. Patient is not currently taking opioid prescriptions. Functional ability and status Nutritional status Physical activity Advanced directives List of other physicians Hospitalizations, surgeries, and ER visits in previous 12 months Vitals Screenings to include cognitive, depression, and falls Referrals and appointments  In addition, I have reviewed and discussed with patient certain preventive protocols, quality metrics, and best practice recommendations. A written personalized care plan for preventive services as well as general preventive health recommendations were provided to patient.     SSheral Flow LPN   72/06/5807  Nurse Notes:  Patient is cogitatively intact. There were no vitals filed for this visit. There is no height or weight on file to calculate  BMI. Patient stated that he has no issues with gait or balance; does not use any assistive devices.

## 2021-10-11 ENCOUNTER — Other Ambulatory Visit: Payer: Self-pay | Admitting: Internal Medicine

## 2021-10-11 DIAGNOSIS — I10 Essential (primary) hypertension: Secondary | ICD-10-CM

## 2021-10-11 DIAGNOSIS — I2510 Atherosclerotic heart disease of native coronary artery without angina pectoris: Secondary | ICD-10-CM

## 2021-10-11 DIAGNOSIS — F418 Other specified anxiety disorders: Secondary | ICD-10-CM

## 2021-10-15 ENCOUNTER — Telehealth: Payer: Self-pay | Admitting: Internal Medicine

## 2021-10-15 ENCOUNTER — Other Ambulatory Visit: Payer: Self-pay | Admitting: Internal Medicine

## 2021-10-15 DIAGNOSIS — K219 Gastro-esophageal reflux disease without esophagitis: Secondary | ICD-10-CM

## 2021-10-15 MED ORDER — OMEPRAZOLE 40 MG PO CPDR: 40.0000 mg | DELAYED_RELEASE_CAPSULE | Freq: Every day | ORAL | 1 refills | Status: DC

## 2021-10-15 NOTE — Telephone Encounter (Signed)
Caller & Relationship to patient:   Call back [phone]   Date of last office visit:07/13/21   Date of next office visit:   Medication(s) to be refilled: omeprazole (PRILOSEC) 40 MG capsule       Preferred Pharmacy:  [id number]- [place], [place] Phone:  [phone] Fax:  [phone]

## 2021-10-23 NOTE — Progress Notes (Unsigned)
PCP:  Janith Lima, MD Primary Cardiologist: None Electrophysiologist: Will Meredith Leeds, MD   Roy Long is a 71 y.o. male seen today for Will Meredith Leeds, MD for routine electrophysiology followup.  Since last being seen in our clinic the patient reports worsening fatigue and dyspnea over the past several months.  He walks his dog every morning and the symptoms mostly opccur then. He has waves of fatigue and lightheadedness, at times prompting him to sit or even lie down until they pass. No chest pain or syncope. At times has a similar sensation going from bent over to standing.  No specific relation to the ambient temperature, though maybe slightly worse these past few weeks.  he denies chest pain, palpitations, dyspnea, PND, orthopnea, nausea, vomiting, dizziness, syncope, edema, weight gain, or early satiety.  Past Medical History:  Diagnosis Date   Age-related macular degeneration, dry, left eye    Alcohol dependence ([place])    Arthritis    "maybe a little bit in my left knee and right forefinger" (05/30/2015)   Atrial fibrillation ([place])    ablation 05-2015 with no reoccurance as of 09-2015   Basal cell carcinoma    Depression    hx   Family history of adverse reaction to anesthesia    "father had head injuries and dementia; everytime he was put under less of his mentation came back"   Gastroparesis    GERD (gastroesophageal reflux disease)    History of kidney stones    Hypercholesterolemia dx'd 02/2015   Hypertension    Hypothyroidism    Kidney stones    Migraine aura without headache    "very rare now" (05/30/2015)   Pneumonia ~ 2004   Squamous carcinoma    hand, chest   Past Surgical History:  Procedure Laterality Date   ANTERIOR CERVICAL DECOMP/DISCECTOMY FUSION  04/2009   ATRIAL FIBRILLATION ABLATION  05/30/2015   BACK SURGERY     BASAL CELL CARCINOMA EXCISION     chest or back or left arm   CARDIOVERSION  06/2002   CATARACT EXTRACTION W/ INTRAOCULAR LENS  IMPLANT Left ~ 2012   COLONOSCOPY     COLONOSCOPY W/ POLYPECTOMY  02/2003   CYSTOSCOPY W/ STONE MANIPULATION  1980's   "basketted it out; no stent"   ELECTROPHYSIOLOGIC STUDY N/A 05/30/2015   Procedure: Atrial Fibrillation Ablation;  Surgeon: Will Meredith Leeds, MD;  Location: [facility];  Service: Cardiovascular;  Laterality: N/A;   EXTRACORPOREAL SHOCK WAVE LITHOTRIPSY Left 04/12/2016   Procedure: LEFT EXTRACORPOREAL SHOCK WAVE LITHOTRIPSY (ESWL);  Surgeon: Irine Seal, MD;  Location: [facility];  Service: Urology;  Laterality: Left;   INGUINAL HERNIA REPAIR Bilateral 2004   INGUINAL HERNIA REPAIR Bilateral 2004   MOHS SURGERY Right    "temple"   SQUAMOUS CELL CARCINOMA EXCISION     chest or back or left arm    Current Outpatient Medications  Medication Sig Dispense Refill   acyclovir (ZOVIRAX) 400 MG tablet TAKE 1 TABLET 3 TIMES A DAY 270 tablet 1   atorvastatin (LIPITOR) 80 MG tablet TAKE 1 TABLET BY MOUTH EVERY DAY 90 tablet 3   cholecalciferol (VITAMIN D) 1000 UNITS tablet Take 1,000 Units by mouth daily.     cycloSPORINE (RESTASIS) 0.05 % ophthalmic emulsion Place 1 drop into both eyes 2 (two) times daily.     diltiazem (CARDIZEM CD) 240 MG 24 hr capsule TAKE 1 CAPSULE BY MOUTH EVERY DAY 90 capsule 3   ELIQUIS 5 MG TABS  tablet TAKE 1 TABLET BY MOUTH TWICE A DAY 180 tablet 1   finasteride (PROSCAR) 5 MG tablet Take 1 tablet (5 mg total) by mouth daily. 90 tablet 1   Fluorouracil 5 % SOLN Apply topically.     fluticasone (FLONASE) 50 MCG/ACT nasal spray Place 2 sprays into both nostrils daily. 48 g 1   hydrALAZINE (APRESOLINE) 50 MG tablet TAKE 1 TABLET BY MOUTH THREE TIMES A DAY 270 tablet 1   levothyroxine (SYNTHROID) 25 MCG tablet TAKE 1 TABLET BY MOUTH EVERY DAY BEFORE BREAKFAST 90 tablet 1   montelukast (SINGULAIR) 10 MG tablet TAKE 1 TABLET BY MOUTH EVERYDAY AT BEDTIME 90 tablet 1   Multiple Vitamins-Minerals (PRESERVISION AREDS PO) Take 1 tablet by mouth daily.      omeprazole (PRILOSEC) 40 MG capsule Take 1 capsule (40 mg total) by mouth daily. 90 capsule 1   SPIRIVA RESPIMAT 2.5 MCG/ACT AERS INHALE 2 PUFFS BY MOUTH INTO THE LUNGS DAILY 12 g 1   tamsulosin (FLOMAX) 0.4 MG CAPS capsule TAKE 1 CAPSULE BY MOUTH EVERY DAY 90 capsule 1   TRINTELLIX 10 MG TABS tablet TAKE 1 TABLET BY MOUTH EVERY DAY 90 tablet 1   No current facility-administered medications for this visit.    Allergies  Allergen Reactions   Pneumococcal Vaccines Swelling    Shoulder to elbow swelling   Tape Other (See Comments)    Please use "paper" tape   Amlodipine Swelling    constipation    Social History   Socioeconomic History   Marital status: Single    Spouse name: Not on file   Number of children: 0   Years of education: Not on file   Highest education level: Not on file  Occupational History   Occupation: band teacher    Comment: retired  Tobacco Use   Smoking status: Former    Packs/day: 3.00    Years: 17.00    Total pack years: 51.00    Types: Cigarettes    Quit date: 03/30/1983    Years since quitting: 38.6    Passive exposure: Never   Smokeless tobacco: Never  Vaping Use   Vaping Use: Never used  Substance and Sexual Activity   Alcohol use: No    Alcohol/week: 0.0 standard drinks of alcohol    Comment: 05/30/2015 "recovering alcoholic; dry date 6/50/3546"   Drug use: No    Types: Marijuana, LSD, Mescaline    Comment: 03/07/2015 "stopped all drugs back in the 1980's"   Sexual activity: Not Currently    Birth control/protection: None  Other Topics Concern   Not on file  Social History Narrative   Not on file   Social Determinants of Health   Financial Resource Strain: [place]  (10/01/2021)   Overall Financial Resource Strain (CARDIA)    Difficulty of Paying Living Expenses: Not hard at all  Food Insecurity: No Food Insecurity (10/01/2021)   Hunger Vital Sign    Worried About Running Out of Food in the Last Year: Never true    [place] in the  Last Year: Never true  Transportation Needs: No Transportation Needs (10/01/2021)   PRAPARE - Hydrologist (Medical): No    Lack of Transportation (Non-Medical): No  Physical Activity: Sufficiently Active (10/01/2021)   Exercise Vital Sign    Days of Exercise per Week: 5 days    Minutes of Exercise per Session: 60 min  Stress: No Stress Concern Present (10/01/2021)   [place]  [employer] - Occupational Stress Questionnaire    Feeling of Stress : Not at all  Social Connections: Moderately Isolated (10/01/2021)   Social Connection and Isolation Panel [NHANES]    Frequency of Communication with Friends and Family: More than three times a week    Frequency of Social Gatherings with Friends and Family: Three times a week    Attends Religious Services: Never    Active Member of Clubs or Organizations: Yes    Attends Archivist Meetings: More than 4 times per year    Marital Status: Never married  Intimate Partner Violence: Not At Risk (10/01/2021)   Humiliation, Afraid, Rape, and Kick questionnaire    Fear of Current or Ex-Partner: No    Emotionally Abused: No    Physically Abused: No    Sexually Abused: No   Review of Systems: All other systems reviewed and are otherwise negative except as noted above.  Physical Exam: Vitals:   10/27/21 0944  BP: 122/66  Pulse: 76  SpO2: 98%  Weight: 134 lb (60.8 kg)  Height: '5\' 8"'$  (1.727 m)    GEN- The patient is well appearing, alert and oriented x 3 today.   HEENT: normocephalic, atraumatic; sclera clear, conjunctiva pink; hearing intact; oropharynx clear; neck supple, no JVP Lymph- no cervical lymphadenopathy Lungs- Clear to ausculation bilaterally, normal work of breathing.  No wheezes, rales, rhonchi Heart- Regular rate and rhythm, no murmurs, rubs or gallops, PMI not laterally displaced GI- soft, non-tender, non-distended, bowel sounds present, no hepatosplenomegaly Extremities- no  clubbing, cyanosis, or edema; DP/PT/radial pulses 2+ bilaterally MS- no significant deformity or atrophy Skin- warm and dry, no rash or lesion Psych- euthymic mood, full affect Neuro- strength and sensation are intact  EKG is ordered. Personal review of EKG from today shows NSR at 76 bpm  Additional studies reviewed include: Previous EP office notes.   Assessment and Plan:  1. Paroxysmal Atrial Fibrillation s/p ablation 05/30/2015 EKG today shows NSR Continue Eliquis for CHA2DS2VASC of at least 2 Continue Diltiazem     2. HTN Stable on current regimen   3. Fatigue and dyspnea Update echo.  Will also update Stress test given occurrence with exercise and mild/intermediate CAD on prior work up.   Follow up with Dr. [name] in 6 months; sooner with abnormal work up.    Shirley Friar, PA-C  10/27/21 9:51 AM

## 2021-10-26 ENCOUNTER — Other Ambulatory Visit: Payer: Self-pay | Admitting: Cardiology

## 2021-10-27 ENCOUNTER — Encounter: Payer: Self-pay | Admitting: Student

## 2021-10-27 ENCOUNTER — Ambulatory Visit: Payer: Medicare PPO | Admitting: Student

## 2021-10-27 VITALS — BP 122/66 | HR 76 | Ht 68.0 in | Wt 134.0 lb

## 2021-10-27 DIAGNOSIS — R0602 Shortness of breath: Secondary | ICD-10-CM

## 2021-10-27 DIAGNOSIS — R5383 Other fatigue: Secondary | ICD-10-CM

## 2021-10-27 DIAGNOSIS — I480 Paroxysmal atrial fibrillation: Secondary | ICD-10-CM

## 2021-10-27 DIAGNOSIS — I4819 Other persistent atrial fibrillation: Secondary | ICD-10-CM

## 2021-10-27 LAB — BASIC METABOLIC PANEL
BUN/Creatinine Ratio: 14 (ref 10–24)
BUN: 12 mg/dL (ref 8–27)
CO2: 23 mmol/L (ref 20–29)
Calcium: 9.4 mg/dL (ref 8.6–10.2)
Chloride: 103 mmol/L (ref 96–106)
Creatinine, Ser: 0.85 mg/dL (ref 0.76–1.27)
Glucose: 106 mg/dL — ABNORMAL HIGH (ref 70–99)
Potassium: 4 mmol/L (ref 3.5–5.2)
Sodium: 139 mmol/L (ref 134–144)
eGFR: 92 mL/min/{1.73_m2} (ref >59)

## 2021-10-27 LAB — CBC
Hematocrit: 41.3 % (ref 37.5–51.0)
Hemoglobin: 14.3 g/dL (ref 13.0–17.7)
MCH: 32 pg (ref 26.6–33.0)
MCHC: 34.6 g/dL (ref 31.5–35.7)
MCV: 92 fL (ref 79–97)
Platelets: 208 10*3/uL (ref 150–450)
RBC: 4.47 x10E6/uL (ref 4.14–5.80)
RDW: 12.1 % (ref 11.6–15.4)
WBC: 6.7 10*3/uL (ref 3.4–10.8)

## 2021-10-27 NOTE — Telephone Encounter (Signed)
Prescription refill request for Eliquis received. Indication:Afib Last office visit:upcoming Scr:0.8 Age: 71 Weight:59.9 kg  Prescription refilled

## 2021-10-27 NOTE — Patient Instructions (Signed)
Medication Instructions:  Your physician recommends that you continue on your current medications as directed. Please refer to the Current Medication list given to you today.  *If you need a refill on your cardiac medications before your next appointment, please call your pharmacy*   Lab Work: TODAY: BMET, CBC  If you have labs (blood work) drawn today and your tests are completely normal, you will receive your results only by: [place] (if you have MyChart) OR A paper copy in the mail If you have any lab test that is abnormal or we need to change your treatment, we will call you to review the results.   Testing/Procedures: Your physician has requested that you have an echocardiogram. Echocardiography is a painless test that uses sound waves to create images of your heart. It provides your doctor with information about the size and shape of your heart and how well your heart's chambers and valves are working. This procedure takes approximately one hour. There are no restrictions for this procedure.  Your physician has requested that you have an exercise tolerance test. For further information please visit [email or web address]. Please also follow instruction sheet, as given.   Follow-Up: At [facility], you and your health needs are our priority.  As part of our continuing mission to provide you with exceptional heart care, we have created designated Provider Care Teams.  These Care Teams include your primary Cardiologist (physician) and Advanced Practice Providers (APPs -  Physician Assistants and Nurse Practitioners) who all work together to provide you with the care you need, when you need it.   Your next appointment:   6 month(s)  The format for your next appointment:   In Person  Provider:   Allegra Lai, MD{   Other Instructions See letter for Stress Test Instructions

## 2021-10-29 ENCOUNTER — Other Ambulatory Visit: Payer: Self-pay | Admitting: Internal Medicine

## 2021-10-29 ENCOUNTER — Telehealth: Payer: Self-pay | Admitting: Internal Medicine

## 2021-10-29 DIAGNOSIS — A6000 Herpesviral infection of urogenital system, unspecified: Secondary | ICD-10-CM

## 2021-10-29 MED ORDER — ACYCLOVIR 400 MG PO TABS: 400.0000 mg | ORAL_TABLET | Freq: Every day | ORAL | 1 refills | Status: DC

## 2021-10-29 NOTE — Telephone Encounter (Signed)
Caller & Relationship to patient: Darryn Kydd  Call back number: [phone]  Date of last office visit: 07/13/21  Date of next office visit:   Medication(s) to be refilled: acyclovir (ZOVIRAX) 400 MG tablet  Preferred Pharmacy:  [id number] [place] [address] Phone:  [phone] Fax:  [phone]

## 2021-11-17 ENCOUNTER — Ambulatory Visit (INDEPENDENT_AMBULATORY_CARE_PROVIDER_SITE_OTHER): Payer: Medicare PPO

## 2021-11-17 ENCOUNTER — Ambulatory Visit (HOSPITAL_COMMUNITY): Payer: Medicare PPO | Attending: Cardiology

## 2021-11-17 DIAGNOSIS — R5383 Other fatigue: Secondary | ICD-10-CM

## 2021-11-17 DIAGNOSIS — I4819 Other persistent atrial fibrillation: Secondary | ICD-10-CM | POA: Diagnosis not present

## 2021-11-17 DIAGNOSIS — R0602 Shortness of breath: Secondary | ICD-10-CM | POA: Diagnosis not present

## 2021-11-17 LAB — EXERCISE TOLERANCE TEST
Angina Index: 0
Duke Treadmill Score: 10
Estimated workload: 12.1
Exercise duration (min): 10 min
Exercise duration (sec): 13 s
MPHR: 147 {beats}/min
Peak HR: 129 {beats}/min
Percent HR: 87 %
Rest HR: 68 {beats}/min
ST Depression (mm): 0 mm

## 2021-11-17 LAB — ECHOCARDIOGRAM COMPLETE
Area-P 1/2: 3.53 cm2
S' Lateral: 2.5 cm

## 2021-11-18 NOTE — Progress Notes (Signed)
Pt has been made aware of normal result and verbalized understanding.  jw

## 2021-11-30 ENCOUNTER — Other Ambulatory Visit: Payer: Self-pay | Admitting: Cardiology

## 2021-12-01 ENCOUNTER — Other Ambulatory Visit: Payer: Self-pay | Admitting: Cardiology

## 2021-12-01 ENCOUNTER — Other Ambulatory Visit: Payer: Self-pay | Admitting: Internal Medicine

## 2021-12-01 DIAGNOSIS — E785 Hyperlipidemia, unspecified: Secondary | ICD-10-CM

## 2021-12-01 DIAGNOSIS — J431 Panlobular emphysema: Secondary | ICD-10-CM

## 2022-01-03 ENCOUNTER — Other Ambulatory Visit: Payer: Self-pay | Admitting: Internal Medicine

## 2022-01-03 DIAGNOSIS — N400 Enlarged prostate without lower urinary tract symptoms: Secondary | ICD-10-CM

## 2022-01-11 ENCOUNTER — Telehealth: Payer: Self-pay

## 2022-01-11 NOTE — Patient Outreach (Signed)
  Care Coordination   Initial Visit Note   01/11/2022 Name: Roy Long MRN: [id number] DOB: Feb 03, 1951  Roy Long is a 71 y.o. year old male who sees Janith Lima, MD for primary care. I spoke with  Yetta Flock by phone today.  What matters to the patients health and wellness today?  Roy Long reports this is not a good time to talk and request to schedule care coordination telephone assessment    Goals Addressed             This Visit's Progress    COMPLETED: Care Coordination activities       Care Coordination Interventions: Briefly discussed care coordination program Patient scheduled to complete care coordination initial assessment        SDOH assessments and interventions completed:  No  Care Coordination Interventions Activated:  Yes  Care Coordination Interventions:  Yes, provided   Follow up plan: Follow up call scheduled for 01/12/22    Encounter Outcome:  Pt. Scheduled   Thea Silversmith, RN, MSN, BSN, [place] Coordinator [phone]

## 2022-01-12 ENCOUNTER — Telehealth: Payer: Self-pay

## 2022-01-12 ENCOUNTER — Ambulatory Visit: Payer: Self-pay

## 2022-01-12 NOTE — Patient Outreach (Signed)
  Care Coordination   Initial Visit Note   01/12/2022 Name: ALEXIZ SUSTAITA MRN: [id number] DOB: [birth year]  ELIAS DENNINGTON is a 71 y.o. year old male who sees Janith Lima, MD for primary care. I spoke with  Yetta Flock by phone today.  What matters to the patients health and wellness today?  Mr. Bunyard reports h/o Gastroparesis. He reports challenges with eating foods. He is interested in some education regarding foods to eat. He reports weight loss of 12-15 pounds over the last 3-4 years. PHQ2 score 2, PHQ9 score 9. He reports he is taking medication for history of depression and plans to discuss with primary care provider.    Goals Addressed             This Visit's Progress    Health management-Gastroparesis       Care Coordination Interventions: Discussed care coordination program Provided education re: Gastroparesis Reviewed upcoming appointments and encouraged to attend as scheduled Discussed LCSW referral and offered to refer to LCSW for counseling resources-Patient declined. Encouraged patient to discuss mood with primary care provider at next visit scheduled 01/19/22 Encouraged patient to discuss Gastroparesis with primary care provider and possible referral to GI doctor if needed Messaged primary care provider to update       SDOH assessments and interventions completed:  Yes  SDOH Interventions Today    Flowsheet Row Most Recent Value  SDOH Interventions   Food Insecurity Interventions Intervention Not Indicated  Transportation Interventions Intervention Not Indicated  Depression Interventions/Treatment  Currently on Treatment  [patient states will discuss with provider at next visit. offered referral to LCSW. Patient declined]     Care Coordination Interventions Activated:  Yes  Care Coordination Interventions:  Yes, provided   Follow up plan: Follow up call scheduled for 02/03/22    Encounter Outcome:  Pt. Visit Completed   Thea Silversmith, RN, MSN,  BSN, [place] Coordinator [phone]

## 2022-01-12 NOTE — Patient Outreach (Signed)
  Care Coordination   01/12/2022 Name: [name] MRN: [id number] DOB: 1950-12-06   Care Coordination Outreach Attempts:  A second unsuccessful outreach was attempted today to offer the patient with information about available care coordination services as a benefit of their health plan.     Follow Up Plan:  Additional outreach attempts will be made to offer the patient care coordination information and services.   Encounter Outcome:  No Answer  Care Coordination Interventions Activated:  No   Care Coordination Interventions:  No, not indicated    Thea Silversmith, RN, MSN, BSN, [place] Coordinator [phone]

## 2022-01-12 NOTE — Patient Instructions (Addendum)
Visit Information  Thank you for taking time to visit with me today. Please don't hesitate to contact me if I can be of assistance to you.   Following are the goals we discussed today:   Goals Addressed             This Visit's Progress    Health management-Gastroparesis       Care Coordination Interventions: Discussed care coordination program Provided education re: Gastroparesis Reviewed upcoming appointments and encouraged to attend as scheduled Discussed LCSW referral and offered to refer to LCSW for counseling resources-Patient declined. Encouraged patient to discuss mood with primary care provider at next visit scheduled 01/19/22 Encouraged patient to discuss Gastroparesis with primary care provider and possible referral to GI doctor if needed Messaged primary care provider to update       Our next appointment is by telephone on 02/03/22 at 1:15 pm  Please call the care guide team at [phone] if you need to cancel or reschedule your appointment.   If you are experiencing a Mental Health or [place] or need someone to talk to, please call the Suicide and Crisis Lifeline: 988  Patient verbalizes understanding of instructions and care plan provided today and agrees to view in [place]. Active MyChart status and patient understanding of how to access instructions and care plan via MyChart confirmed with patient.     Thea Silversmith, RN, MSN, BSN, CCM [facility] Care Coordinator [phone]   Gastroparesis  Gastroparesis is a condition in which food takes longer than normal to empty from the stomach. This condition is also known as delayed gastric emptying. It is usually a long-term (chronic) condition. There is no cure, but there are treatments and things that you can do at home to help relieve symptoms. Treating the underlying condition that causes gastroparesis can also help relieve symptoms. What are the causes? In many cases, the cause of this condition is  not known. Possible causes include: A hormone (endocrine) disorder, such as hypothyroidism or diabetes. A nervous system disease, such as Parkinson's disease or multiple sclerosis. Cancer, infection, or surgery that affects the stomach or vagus nerve. The vagus nerve runs from your chest, through your neck, and to the lower part of your brain. A connective tissue disorder, such as scleroderma. Certain medicines. What increases the risk? You are more likely to develop this condition if: You have certain disorders or diseases. These may include: An endocrine disorder. An eating disorder. Amyloidosis. Scleroderma. Parkinson's disease. Multiple sclerosis. Cancer or infection of the stomach or the vagus nerve. You have had surgery on your stomach or vagus nerve. You take certain medicines. You are male. What are the signs or symptoms? Symptoms of this condition include: Feeling full after eating very little or a loss of appetite. Nausea, vomiting, or heartburn. Bloating of your abdomen. Inconsistent blood sugar (glucose) levels on blood tests. Unexplained weight loss. Acid from the stomach coming up into the esophagus (gastroesophageal reflux). Sudden tightening (spasm) of the stomach, which can be painful. Symptoms may come and go. Some people may not notice any symptoms. How is this diagnosed? This condition is diagnosed with tests, such as: Tests that check how long it takes food to move through the stomach and intestines. These tests include: Upper gastrointestinal (GI) series. For this test, you drink a liquid that shows up well on X-rays, and then X-rays are taken of your intestines. Gastric emptying scintigraphy. For this test, you eat food that contains a small amount of radioactive material,  and then scans are taken. Wireless capsule GI monitoring system. For this test, you swallow a pill (capsule) that records information about how foods and fluid move through your  stomach. Gastric manometry. For this test, a tube is passed down your throat and into your stomach to measure electrical and muscular activity. Endoscopy. For this test, a long, thin tube with a camera and light on the end is passed down your throat and into your stomach to check for problems in your stomach lining. Ultrasound. This test uses sound waves to create images of the inside of your body. This can help rule out gallbladder disease or pancreatitis as a cause of your symptoms. How is this treated? There is no cure for this condition, but treatment and home care may relieve symptoms. Treatment may include: Treating the underlying cause. Managing your symptoms by making changes to your diet and exercise habits. Taking medicines to control nausea and vomiting and to stimulate stomach muscles. Getting food through a feeding tube in the hospital. This may be done in severe cases. Having surgery to insert a device called a gastric electrical stimulator into your body. This device helps improve stomach emptying and control nausea and vomiting. Follow these instructions at home: Take over-the-counter and prescription medicines only as told by your health care provider. Follow instructions from your health care provider about eating or drinking restrictions. Your health care provider may recommend that you: Eat smaller meals more often. Eat low-fat foods. Eat low-fiber forms of high-fiber foods. For example, eat cooked vegetables instead of raw vegetables. Have only liquid foods instead of solid foods. Liquid foods are easier to digest. Drink enough fluid to keep your urine pale yellow. Exercise as often as told by your health care provider. Keep all follow-up visits. This is important. Contact a health care provider if you: Notice that your symptoms do not improve with treatment. Have new symptoms. Get help right away if you: Have severe pain in your abdomen that does not improve with  treatment. Have nausea that is severe or does not go away. Vomit every time you drink fluids. Summary Gastroparesis is a long-term (chronic) condition in which food takes longer than normal to empty from the stomach. Symptoms include nausea, vomiting, heartburn, bloating of your abdomen, and loss of appetite. Eating smaller portions, low-fat foods, and low-fiber forms of high-fiber foods may help you manage your symptoms. Get help right away if you have severe pain in your abdomen. This information is not intended to replace advice given to you by your health care provider. Make sure you discuss any questions you have with your health care provider. Document Revised: 07/23/2019 Document Reviewed: 07/23/2019 Elsevier Patient Education  [place].

## 2022-01-19 ENCOUNTER — Encounter: Payer: Self-pay | Admitting: Internal Medicine

## 2022-01-19 ENCOUNTER — Ambulatory Visit: Payer: Medicare PPO | Admitting: Internal Medicine

## 2022-01-19 VITALS — BP 136/72 | HR 73 | Temp 98.0°F | Resp 16 | Ht 68.0 in | Wt 133.0 lb

## 2022-01-19 DIAGNOSIS — N400 Enlarged prostate without lower urinary tract symptoms: Secondary | ICD-10-CM

## 2022-01-19 DIAGNOSIS — E785 Hyperlipidemia, unspecified: Secondary | ICD-10-CM | POA: Diagnosis not present

## 2022-01-19 DIAGNOSIS — E039 Hypothyroidism, unspecified: Secondary | ICD-10-CM

## 2022-01-19 DIAGNOSIS — Z0000 Encounter for general adult medical examination without abnormal findings: Secondary | ICD-10-CM

## 2022-01-19 DIAGNOSIS — I10 Essential (primary) hypertension: Secondary | ICD-10-CM | POA: Diagnosis not present

## 2022-01-19 DIAGNOSIS — F331 Major depressive disorder, recurrent, moderate: Secondary | ICD-10-CM | POA: Diagnosis not present

## 2022-01-19 DIAGNOSIS — G5793 Unspecified mononeuropathy of bilateral lower limbs: Secondary | ICD-10-CM

## 2022-01-19 DIAGNOSIS — I480 Paroxysmal atrial fibrillation: Secondary | ICD-10-CM

## 2022-01-19 DIAGNOSIS — J431 Panlobular emphysema: Secondary | ICD-10-CM

## 2022-01-19 MED ORDER — MIRTAZAPINE 7.5 MG PO TABS: 7.5000 mg | ORAL_TABLET | Freq: Every day | ORAL | 0 refills | Status: DC

## 2022-01-19 MED ORDER — VORTIOXETINE HBR 5 MG PO TABS: 5.0000 mg | ORAL_TABLET | Freq: Every day | ORAL | 0 refills | Status: DC

## 2022-01-19 MED ORDER — SPIRIVA RESPIMAT 2.5 MCG/ACT IN AERS: INHALATION_SPRAY | RESPIRATORY_TRACT | 1 refills | Status: DC

## 2022-01-19 NOTE — Patient Instructions (Signed)
Health Maintenance, Male Adopting a healthy lifestyle and getting preventive care are important in promoting health and wellness. Ask your health care provider about: The right schedule for you to have regular tests and exams. Things you can do on your own to prevent diseases and keep yourself healthy. What should I know about diet, weight, and exercise? Eat a healthy diet  Eat a diet that includes plenty of vegetables, fruits, low-fat dairy products, and lean protein. Do not eat a lot of foods that are high in solid fats, added sugars, or sodium. Maintain a healthy weight Body mass index (BMI) is a measurement that can be used to identify possible weight problems. It estimates body fat based on height and weight. Your health care provider can help determine your BMI and help you achieve or maintain a healthy weight. Get regular exercise Get regular exercise. This is one of the most important things you can do for your health. Most adults should: Exercise for at least 150 minutes each week. The exercise should increase your heart rate and make you sweat (moderate-intensity exercise). Do strengthening exercises at least twice a week. This is in addition to the moderate-intensity exercise. Spend less time sitting. Even light physical activity can be beneficial. Watch cholesterol and blood lipids Have your blood tested for lipids and cholesterol at 73 years of age, then have this test every 5 years. You may need to have your cholesterol levels checked more often if: Your lipid or cholesterol levels are high. You are older than 73 years of age. You are at high risk for heart disease. What should I know about cancer screening? Many types of cancers can be detected early and may often be prevented. Depending on your health history and family history, you may need to have cancer screening at various ages. This may include screening for: Colorectal cancer. Prostate cancer. Skin cancer. Lung  cancer. What should I know about heart disease, diabetes, and high blood pressure? Blood pressure and heart disease High blood pressure causes heart disease and increases the risk of stroke. This is more likely to develop in people who have high blood pressure readings or are overweight. Talk with your health care provider about your target blood pressure readings. Have your blood pressure checked: Every 3-5 years if you are 18-39 years of age. Every year if you are 40 years old or older. If you are between the ages of 65 and 75 and are a current or former smoker, ask your health care provider if you should have a one-time screening for abdominal aortic aneurysm (AAA). Diabetes Have regular diabetes screenings. This checks your fasting blood sugar level. Have the screening done: Once every three years after age 45 if you are at a normal weight and have a low risk for diabetes. More often and at a younger age if you are overweight or have a high risk for diabetes. What should I know about preventing infection? Hepatitis B If you have a higher risk for hepatitis B, you should be screened for this virus. Talk with your health care provider to find out if you are at risk for hepatitis B infection. Hepatitis C Blood testing is recommended for: Everyone born from 1945 through 1965. Anyone with known risk factors for hepatitis C. Sexually transmitted infections (STIs) You should be screened each year for STIs, including gonorrhea and chlamydia, if: You are sexually active and are younger than 73 years of age. You are older than 73 years of age and your   health care provider tells you that you are at risk for this type of infection. Your sexual activity has changed since you were last screened, and you are at increased risk for chlamydia or gonorrhea. Ask your health care provider if you are at risk. Ask your health care provider about whether you are at high risk for HIV. Your health care provider  may recommend a prescription medicine to help prevent HIV infection. If you choose to take medicine to prevent HIV, you should first get tested for HIV. You should then be tested every 3 months for as long as you are taking the medicine. Follow these instructions at home: Alcohol use Do not drink alcohol if your health care provider tells you not to drink. If you drink alcohol: Limit how much you have to 0-2 drinks a day. Know how much alcohol is in your drink. In the [place] one drink equals one 12 oz bottle of beer (355 mL), one 5 oz glass of wine (148 mL), or one 1 oz glass of hard liquor (44 mL). Lifestyle Do not use any products that contain nicotine or tobacco. These products include cigarettes, chewing tobacco, and vaping devices, such as e-cigarettes. If you need help quitting, ask your health care provider. Do not use street drugs. Do not share needles. Ask your health care provider for help if you need support or information about quitting drugs. General instructions Schedule regular health, dental, and eye exams. Stay current with your vaccines. Tell your health care provider if: You often feel depressed. You have ever been abused or do not feel safe at home. Summary Adopting a healthy lifestyle and getting preventive care are important in promoting health and wellness. Follow your health care provider's instructions about healthy diet, exercising, and getting tested or screened for diseases. Follow your health care provider's instructions on monitoring your cholesterol and blood pressure. This information is not intended to replace advice given to you by your health care provider. Make sure you discuss any questions you have with your health care provider. Document Revised: 08/04/2020 Document Reviewed: 08/04/2020 Elsevier Patient Education  2023 [employer]  

## 2022-01-19 NOTE — Progress Notes (Signed)
Subjective:  Patient ID: Roy Long, male    DOB: 1950/10/28  Age: 71 y.o. MRN: [id number]  CC: Annual Exam, Atrial Fibrillation, and Hypothyroidism   HPI JASN XIA presents for a CPX and f/up -   He complains of poor appetite and early satiety.  He has rare nausea but no vomiting.  He also reports a sensation of increase in depression and anxiety.  He continues to complain of numbness and tingling in his feet and tells me he does not feel comfortable wearing socks.  He has foot pain but it sounds like it is related to osteoarthritis.  He walks his dog and does not experience chest pain, shortness of breath, diaphoresis, or edema.  Outpatient Medications Prior to Visit  Medication Sig Dispense Refill   acyclovir (ZOVIRAX) 400 MG tablet Take 1 tablet (400 mg total) by mouth daily. 90 tablet 1   atorvastatin (LIPITOR) 80 MG tablet TAKE 1 TABLET BY MOUTH EVERY DAY 90 tablet 3   cholecalciferol (VITAMIN D) 1000 UNITS tablet Take 1,000 Units by mouth daily.     cycloSPORINE (RESTASIS) 0.05 % ophthalmic emulsion Place 1 drop into both eyes 2 (two) times daily.     diltiazem (CARDIZEM CD) 240 MG 24 hr capsule TAKE 1 CAPSULE BY MOUTH EVERY DAY 90 capsule 3   ELIQUIS 5 MG TABS tablet TAKE 1 TABLET BY MOUTH TWICE A DAY 180 tablet 1   finasteride (PROSCAR) 5 MG tablet Take 1 tablet (5 mg total) by mouth daily. 90 tablet 1   Fluorouracil 5 % SOLN Apply topically.     hydrALAZINE (APRESOLINE) 50 MG tablet TAKE 1 TABLET BY MOUTH THREE TIMES A DAY 270 tablet 1   levothyroxine (SYNTHROID) 25 MCG tablet TAKE 1 TABLET BY MOUTH EVERY DAY BEFORE BREAKFAST 90 tablet 1   montelukast (SINGULAIR) 10 MG tablet TAKE 1 TABLET BY MOUTH EVERYDAY AT BEDTIME 90 tablet 1   Multiple Vitamins-Minerals (PRESERVISION AREDS PO) Take 1 tablet by mouth daily.     omeprazole (PRILOSEC) 40 MG capsule Take 1 capsule (40 mg total) by mouth daily. 90 capsule 1   tamsulosin (FLOMAX) 0.4 MG CAPS capsule TAKE 1 CAPSULE BY  MOUTH EVERY DAY 90 capsule 1   fluticasone (FLONASE) 50 MCG/ACT nasal spray Place 2 sprays into both nostrils daily. 48 g 1   SPIRIVA RESPIMAT 2.5 MCG/ACT AERS INHALE 2 PUFFS BY MOUTH INTO THE LUNGS DAILY 12 g 1   TRINTELLIX 10 MG TABS tablet TAKE 1 TABLET BY MOUTH EVERY DAY 90 tablet 1   No facility-administered medications prior to visit.    ROS Review of Systems  Constitutional:  Negative for chills, diaphoresis, fatigue and fever.  HENT: Negative.  Negative for trouble swallowing.   Eyes: Negative.   Respiratory:  Negative for cough, chest tightness, shortness of breath and wheezing.   Cardiovascular:  Negative for chest pain, palpitations and leg swelling.  Gastrointestinal:  Positive for nausea. Negative for abdominal pain, blood in stool, constipation, diarrhea and vomiting.  Endocrine: Negative.   Genitourinary: Negative.  Negative for difficulty urinating.  Musculoskeletal:  Positive for arthralgias. Negative for back pain, myalgias and neck pain.  Skin: Negative.   Neurological: Negative.  Negative for weakness.  Hematological:  Negative for adenopathy. Does not bruise/bleed easily.  Psychiatric/Behavioral: Negative.      Objective:  BP 136/72 (BP Location: Right Arm, Patient Position: Sitting, Cuff Size: Large)   Pulse 73   Temp 98 F (36.7 C) (Oral)  Resp 16   Ht '5\' 8"'$  (1.727 m)   Wt 133 lb (60.3 kg)   SpO2 97%   BMI 20.22 kg/m   BP Readings from Last 3 Encounters:  01/19/22 136/72  10/27/21 122/66  07/13/21 132/68    Wt Readings from Last 3 Encounters:  01/19/22 133 lb (60.3 kg)  10/27/21 134 lb (60.8 kg)  07/13/21 132 lb (59.9 kg)    Physical Exam Vitals reviewed.  Constitutional:      Appearance: Normal appearance.  HENT:     Nose: Nose normal.     Mouth/Throat:     Mouth: Mucous membranes are moist.  Eyes:     General: No scleral icterus.    Conjunctiva/sclera: Conjunctivae normal.  Cardiovascular:     Rate and Rhythm: Normal rate and  regular rhythm.     Heart sounds: No murmur heard. Pulmonary:     Effort: Pulmonary effort is normal.     Breath sounds: No stridor. No wheezing, rhonchi or rales.  Abdominal:     General: Abdomen is flat.     Palpations: There is no mass.     Tenderness: There is no abdominal tenderness. There is no guarding or rebound.     Hernia: No hernia is present. There is no hernia in the left inguinal area or right inguinal area.  Genitourinary:    Pubic Area: No rash.      Penis: Normal and circumcised.      Testes: Normal.     Epididymis:     Right: Normal.     Left: Normal.     Prostate: Normal. Not enlarged, not tender and no nodules present.     Rectum: Normal. Guaiac result negative. No mass, tenderness, anal fissure, external hemorrhoid or internal hemorrhoid. Normal anal tone.  Musculoskeletal:        General: Normal range of motion.     Cervical back: Neck supple.     Right lower leg: No edema.     Left lower leg: No edema.  Lymphadenopathy:     Cervical: No cervical adenopathy.     Lower Body: No right inguinal adenopathy. No left inguinal adenopathy.  Skin:    General: Skin is warm and dry.  Neurological:     General: No focal deficit present.     Mental Status: He is alert.  Psychiatric:        Attention and Perception: Attention and perception normal. He is attentive.        Mood and Affect: Mood is anxious and depressed. Affect is flat.        Speech: He is communicative. Speech is delayed and tangential.        Behavior: Behavior normal.        Thought Content: Thought content normal. Thought content is not paranoid or delusional. Thought content does not include homicidal or suicidal ideation.        Cognition and Memory: Cognition normal.        Judgment: Judgment normal.     Lab Results  Component Value Date   WBC 6.7 10/27/2021   HGB 14.3 10/27/2021   HCT 41.3 10/27/2021   PLT 208 10/27/2021   GLUCOSE 106 (H) 10/27/2021   CHOL 93 01/19/2022   TRIG 137.0  01/19/2022   HDL 42.40 01/19/2022   LDLCALC 23 01/19/2022   ALT 18 01/19/2022   AST 18 01/19/2022   NA 139 10/27/2021   K 4.0 10/27/2021   CL 103 10/27/2021   CREATININE  0.85 10/27/2021   BUN 12 10/27/2021   CO2 23 10/27/2021   TSH 4.32 01/19/2022   PSA 0.07 (L) 01/19/2022   INR 1.0 04/15/2011   HGBA1C 5.5 04/18/2020    No results found.  Assessment & Plan:   Leotha was seen today for annual exam, atrial fibrillation and hypothyroidism.  Diagnoses and all orders for this visit:  Essential hypertension- His blood pressure is well controlled. -     Lipid panel; Future -     Hepatic function panel; Future -     Hepatic function panel -     Lipid panel  Paroxysmal atrial fibrillation ([place])- He has good rate and rhythm control. -     TSH; Future -     TSH  Acquired hypothyroidism- He is euthyroid. -     TSH; Future -     Hepatic function panel; Future -     Hepatic function panel -     TSH  Benign prostatic hyperplasia without lower urinary tract symptoms -     PSA; Future -     PSA  Routine general medical examination at a health care facility- Exam completed, labs reviewed, vaccines reviewed and updated, cancer screenings are up-to-date, patient education was given.  Hyperlipidemia with target LDL less than 70- LDL goal achieved. Doing well on the statin  -     Lipid panel; Future -     Hepatic function panel; Future -     Hepatic function panel -     Lipid panel  Panlobular emphysema (HCC) -     Tiotropium Bromide Monohydrate (SPIRIVA RESPIMAT) 2.5 MCG/ACT AERS; INHALE 2 PUFFS BY MOUTH INTO THE LUNGS DAILY  Moderate episode of recurrent major depressive disorder ([place])- Will transition from Trintellix to mirtazapine to see if this will help with his GI symptoms. -     mirtazapine (REMERON) 7.5 MG tablet; Take 1 tablet (7.5 mg total) by mouth at bedtime. -     vortioxetine HBr (TRINTELLIX) 5 MG TABS tablet; Take 1 tablet (5 mg total) by mouth daily.  Neuropathy  involving both lower extremities -     Ambulatory referral to Neurology   I have discontinued Levada Dy L. Ramer's fluticasone and Trintellix. I have also changed his Spiriva Respimat. Additionally, I am having him start on mirtazapine and vortioxetine HBr. Lastly, I am having him maintain his cycloSPORINE, cholecalciferol, Multiple Vitamins-Minerals (PRESERVISION AREDS PO), finasteride, levothyroxine, montelukast, Fluorouracil, hydrALAZINE, omeprazole, Eliquis, acyclovir, diltiazem, atorvastatin, and tamsulosin.  Meds ordered this encounter  Medications   Tiotropium Bromide Monohydrate (SPIRIVA RESPIMAT) 2.5 MCG/ACT AERS    Sig: INHALE 2 PUFFS BY MOUTH INTO THE LUNGS DAILY    Dispense:  12 g    Refill:  1   mirtazapine (REMERON) 7.5 MG tablet    Sig: Take 1 tablet (7.5 mg total) by mouth at bedtime.    Dispense:  30 tablet    Refill:  0   vortioxetine HBr (TRINTELLIX) 5 MG TABS tablet    Sig: Take 1 tablet (5 mg total) by mouth daily.    Dispense:  30 tablet    Refill:  0     Follow-up: Return in about 6 months (around 07/21/2022).  [name], MD

## 2022-01-20 LAB — LIPID PANEL
Cholesterol: 93 mg/dL (ref 0–200)
HDL: 42.4 mg/dL (ref >39.00)
LDL Cholesterol: 23 mg/dL (ref 0–99)
NonHDL: 50.72
Total CHOL/HDL Ratio: 2
Triglycerides: 137 mg/dL (ref 0.0–149.0)
VLDL: 27.4 mg/dL (ref 0.0–40.0)

## 2022-01-20 LAB — HEPATIC FUNCTION PANEL
ALT: 18 U/L (ref 0–53)
AST: 18 U/L (ref 0–37)
Albumin: 4.5 g/dL (ref 3.5–5.2)
Alkaline Phosphatase: 79 U/L (ref 39–117)
Bilirubin, Direct: 0.1 mg/dL (ref 0.0–0.3)
Total Bilirubin: 0.5 mg/dL (ref 0.2–1.2)
Total Protein: 6.6 g/dL (ref 6.0–8.3)

## 2022-01-20 LAB — TSH: TSH: 4.32 u[IU]/mL (ref 0.35–5.50)

## 2022-01-20 LAB — PSA: PSA: 0.07 ng/mL — ABNORMAL LOW (ref 0.10–4.00)

## 2022-01-21 ENCOUNTER — Encounter: Payer: Self-pay | Admitting: Internal Medicine

## 2022-01-21 ENCOUNTER — Telehealth: Payer: Self-pay | Admitting: Internal Medicine

## 2022-01-21 NOTE — Telephone Encounter (Signed)
Pt called to report picking up the Trintellix prescription but states provider was to give him written instructions how to taper dosage as discussed at last visit.  Pt request MyChart message from provider with written instructions.

## 2022-01-31 ENCOUNTER — Other Ambulatory Visit: Payer: Self-pay | Admitting: Internal Medicine

## 2022-01-31 DIAGNOSIS — F331 Major depressive disorder, recurrent, moderate: Secondary | ICD-10-CM

## 2022-02-01 ENCOUNTER — Other Ambulatory Visit: Payer: Self-pay | Admitting: Internal Medicine

## 2022-02-01 DIAGNOSIS — F331 Major depressive disorder, recurrent, moderate: Secondary | ICD-10-CM

## 2022-02-01 DIAGNOSIS — J431 Panlobular emphysema: Secondary | ICD-10-CM

## 2022-02-01 MED ORDER — MIRTAZAPINE 7.5 MG PO TABS: 7.5000 mg | ORAL_TABLET | Freq: Every day | ORAL | 0 refills | Status: DC

## 2022-02-02 DIAGNOSIS — H18413 Arcus senilis, bilateral: Secondary | ICD-10-CM | POA: Diagnosis not present

## 2022-02-02 DIAGNOSIS — H02831 Dermatochalasis of right upper eyelid: Secondary | ICD-10-CM | POA: Diagnosis not present

## 2022-02-02 DIAGNOSIS — H04123 Dry eye syndrome of bilateral lacrimal glands: Secondary | ICD-10-CM | POA: Diagnosis not present

## 2022-02-02 DIAGNOSIS — Z961 Presence of intraocular lens: Secondary | ICD-10-CM | POA: Diagnosis not present

## 2022-02-03 ENCOUNTER — Telehealth: Payer: Self-pay

## 2022-02-03 NOTE — Patient Outreach (Signed)
  Care Coordination   02/03/2022 Name: Roy Long MRN: [id number] DOB: [birth year]   Care Coordination Outreach Attempts:  An unsuccessful telephone outreach was attempted today to offer the patient information about available care coordination services as a benefit of their health plan.   Follow Up Plan:  Additional outreach attempts will be made to offer the patient care coordination information and services.   Encounter Outcome:  No Answer  Care Coordination Interventions Activated:  No   Care Coordination Interventions:  No, not indicated    Thea Silversmith, RN, MSN, BSN, [place] Coordinator [phone]

## 2022-02-08 ENCOUNTER — Encounter: Payer: Self-pay | Admitting: Internal Medicine

## 2022-03-11 ENCOUNTER — Ambulatory Visit (INDEPENDENT_AMBULATORY_CARE_PROVIDER_SITE_OTHER): Payer: Medicare PPO

## 2022-03-11 ENCOUNTER — Encounter: Payer: Self-pay | Admitting: Sports Medicine

## 2022-03-11 ENCOUNTER — Ambulatory Visit (INDEPENDENT_AMBULATORY_CARE_PROVIDER_SITE_OTHER): Payer: Medicare PPO | Admitting: Sports Medicine

## 2022-03-11 VITALS — BP 162/70 | HR 84 | Ht 68.0 in | Wt 137.4 lb

## 2022-03-11 DIAGNOSIS — M899 Disorder of bone, unspecified: Secondary | ICD-10-CM

## 2022-03-11 DIAGNOSIS — M542 Cervicalgia: Secondary | ICD-10-CM

## 2022-03-11 DIAGNOSIS — S46812A Strain of other muscles, fascia and tendons at shoulder and upper arm level, left arm, initial encounter: Secondary | ICD-10-CM | POA: Diagnosis not present

## 2022-03-11 MED ORDER — LIDOCAINE HCL 1 % IJ SOLN
1.0000 mL | INTRAMUSCULAR | Status: AC | PRN
  Administered 2022-03-11: 1 mL

## 2022-03-11 MED ORDER — BUPIVACAINE HCL 0.25 % IJ SOLN
1.0000 mL | INTRAMUSCULAR | Status: AC | PRN
  Administered 2022-03-11: 1 mL

## 2022-03-11 MED ORDER — METHYLPREDNISOLONE ACETATE 40 MG/ML IJ SUSP
40.0000 mg | INTRAMUSCULAR | Status: AC | PRN
  Administered 2022-03-11: 40 mg via INTRAMUSCULAR

## 2022-03-11 NOTE — Progress Notes (Signed)
Roy Long - 71 y.o. male MRN [id number]  Date of birth: [birth year]  Office Visit Note: Visit Date: 03/11/2022 PCP: Janith Lima, MD Referred by: Janith Lima, MD  Subjective: Chief Complaint  Patient presents with   Neck - Pain   Left Shoulder - Pain   HPI: Roy Long is a pleasant 71 y.o. male who presents today for neck pain and trapezius pain.  He has had left-sided neck pain and shoulder pain for the last 3 weeks or so.  Over the last few months he has been trying to get back into the gym post COVID activity.  Shortly before his pain onset he was trying to increase some of his weight with bicep currently, also adding some shoulder and trapezius abduction exercises.  If his pain radiated down the shoulder although he over the last few days is primarily in the neck and trapezius muscle.  It feels tight slightly swollen.  He denies any fever chills or redness.  The pain will radiate down the trap to the anterior shoulder, it has gone down the proximal deltoid at times but no numbness tingling or radicular pain from the elbow down.  No weakness of the upper extremity.  Pertinent ROS were reviewed with the patient and found to be negative unless otherwise specified above in HPI.   Assessment & Plan: Visit Diagnoses:  1. Neck pain   2. Trapezius strain, left, initial encounter   3. Scapular dysfunction    Plan: I discussed with Roy Long today the likely etiology of his pain.  I do not think this is more muscular in nature, but he certainly does have some cervical spondylosis with a prior fusion at the C5-C7 level.  His trapezius and upper left scapular border had notable trigger points and were very hypertonic today.  We discussed all treatment options such as anti-inflammatories, home rehab versus physical therapy, possible injection therapy or advanced imaging.  Will avoid NSAIDs given his Eliquis use, the patient did not prefer to take oral prednisone.  We proceeded with  trigger point injections into the affected areas.  Will also get him started on formalized physical therapy for the neck trapezius and scapular dysfunction. I like to see what sort of results he gets from this over the next month.  He may also continue with Tylenol over-the-counter.  Some of his pain was described as a burning-like pain, if this continues more radicular symptoms become worsening, neck step may be happy to see one of my spine surgeons or proceed with advanced imaging of the cervical spine.  Follow-up: Return in about 4 weeks (around 04/08/2022) for with Dr. Rolena Infante for neck/trap.   Meds & Orders: No orders of the defined types were placed in this encounter.   Orders Placed This Encounter  Procedures   Trigger Point Inj   XR Cervical Spine With Flex & Extend   Ambulatory referral to Physical Therapy     Procedures: Trigger Point Inj  Date/Time: 03/11/2022 12:57 PM  Performed by: Elba Barman, DO Authorized by: Elba Barman, DO   Total # of Trigger Points:  2 Location: neck and shoulder   Needle Size:  27 G Approach:  Dorsal Medications #1:  1 mL lidocaine 1 %; 1 mL bupivacaine 0.25 % Medications #2:  40 mg methylPREDNISolone acetate 40 MG/ML Patient tolerance:  Patient tolerated the procedure well with no immediate complications Comments: Procedure: Trigger point injection, left trapezius After discussion on R/B/I and informed verbal consent was  obtained, a timeout was conducted. The patient was placed in a prone position on the examination table and the area of maximal tenderness was identified over the proximal trapezius and superior scapular area.  This area was cleansed with Betadine and multiple alcohol swabs. Ethyl chloride was used for local anesthesia. Using a 27-gauge 1.25 inch needle the trigger point(s) was subsequently injected with a mixture of 1 cc of methylprednisolone 40 mg/mL and 1 cc of 1% lidocaine without epinephrine and 1 cc of bupivicaine 0.25%, with a  total of 1.5cc of injectate into each trigger point. A band-aid was applied following. Patient tolerated procedure well, there were no post-injection complications. Post-procedure instructions were given.         Clinical History: No specialty comments available.  He reports that he quit smoking about 38 years ago. His smoking use included cigarettes. He has a 51.00 pack-year smoking history. He has never been exposed to tobacco smoke. He has never used smokeless tobacco. No results for input(s): "HGBA1C", "LABURIC" in the last 8760 hours.  Objective:   Vital Signs: BP (!) 162/70 (BP Location: Left Arm, Patient Position: Sitting, Cuff Size: Normal)   Pulse 84   Ht '5\' 8"'$  (1.727 m)   Wt 137 lb 6.4 oz (62.3 kg)   BMI 20.89 kg/m   Physical Exam  Gen: Well-appearing, in no acute distress; non-toxic CV: Regular Rate. Well-perfused. Warm.  Resp: Breathing unlabored on room air; no wheezing. Psych: Fluid speech in conversation; appropriate affect; normal thought process Neuro: Sensation intact throughout. No gross coordination deficits.   Ortho Exam - Cervical/trapezius: There is no midline spinous process TTP.  Slight restriction in both flexion and extension.  Negative Spurling's maneuver.  There is 5/5 strength of the upper extremities in the C5-T1 nerve root distribution bilaterally.  There is notable left-sided cervical hypertonicity and significant hypertonicity of the left trapezius with some soft tissue swelling.  There is a trigger point tender point in the mid belly of the trapezius and the musculature just superior to the medial border of the scapula.  - Left shoulder: No bony TTP.  No overlying swelling, effusion or erythema.  There is equivocal range of motion and relatively full active range of motion in all directions.  Rotator cuff testing appears intact.  5/5 strength.  Negative painful drop arm test.  Neurovascular intact distally.  Imaging: XR Cervical Spine With Flex &  Extend  Result Date: 03/11/2022 4 views of the cervical spine including AP, lateral and flexion-extension views were ordered and reviewed by myself.  X-rays demonstrate a prior cervical fusion and from C5-C7.  There is grade 1 anterior listhesis of C7 on T1.  There is anterior spurring of C4 and notable facet sclerosis from C3-C5.  No acute fracture noted.   Past Medical/Family/Surgical/Social History: Medications & Allergies reviewed per EMR, new medications updated. Patient Active Problem List   Diagnosis Date Noted   Moderate episode of recurrent major depressive disorder ([place]) 01/19/2022   Hearing loss due to cerumen impaction, bilateral 07/13/2021   Panlobular emphysema ([place]) 01/07/2021   Neuropathy involving both lower extremities 04/18/2020   Seasonal allergic rhinitis due to pollen 10/24/2018   Essential hypertension 04/25/2018   Laryngopharyngeal reflux (LPR) 02/07/2018   Kidney stone on left side 03/30/2016   IBS (irritable bowel syndrome) 06/27/2015   Coronary artery disease involving native coronary artery of native heart without angina pectoris 06/26/2015   OAB (overactive bladder) 04/16/2015   Atrial flutter ([place])    Hyperlipidemia with  target LDL less than 70 01/18/2014   BPH (benign prostatic hyperplasia) 04/23/2011   Hypothyroidism 04/15/2011   Routine general medical examination at a health care facility 04/15/2011   Atrial fibrillation [facility])    Past Medical History:  Diagnosis Date   Age-related macular degeneration, dry, left eye    Alcohol dependence ([place])    Arthritis    "maybe a little bit in my left knee and right forefinger" (05/30/2015)   Atrial fibrillation ([place])    ablation 05-2015 with no reoccurance as of 09-2015   Basal cell carcinoma    Depression    hx   Family history of adverse reaction to anesthesia    "father had head injuries and dementia; everytime he was put under less of his mentation came back"   Gastroparesis    GERD (gastroesophageal  reflux disease)    History of kidney stones    Hypercholesterolemia dx'd 02/2015   Hypertension    Hypothyroidism    Kidney stones    Migraine aura without headache    "very rare now" (05/30/2015)   Pneumonia ~ 2004   Squamous carcinoma    hand, chest   Family History  Problem Relation Age of Onset   Stroke Brother    Cancer Brother        basosquamous cell carcinoma-head   Heart failure Mother    Colon cancer Cousin 27   Heart disease Neg Hx    Hyperlipidemia Neg Hx    Hypertension Neg Hx    Diabetes Neg Hx    Past Surgical History:  Procedure Laterality Date   ANTERIOR CERVICAL DECOMP/DISCECTOMY FUSION  04/2009   ATRIAL FIBRILLATION ABLATION  05/30/2015   BACK SURGERY     BASAL CELL CARCINOMA EXCISION     chest or back or left arm   CARDIOVERSION  06/2002   CATARACT EXTRACTION W/ INTRAOCULAR LENS IMPLANT Left ~ 2012   COLONOSCOPY     COLONOSCOPY W/ POLYPECTOMY  02/2003   CYSTOSCOPY W/ STONE MANIPULATION  1980's   "basketted it out; no stent"   ELECTROPHYSIOLOGIC STUDY N/A 05/30/2015   Procedure: Atrial Fibrillation Ablation;  Surgeon: Will Meredith Leeds, MD;  Location: [facility];  Service: Cardiovascular;  Laterality: N/A;   EXTRACORPOREAL SHOCK WAVE LITHOTRIPSY Left 04/12/2016   Procedure: LEFT EXTRACORPOREAL SHOCK WAVE LITHOTRIPSY (ESWL);  Surgeon: Irine Seal, MD;  Location: [facility];  Service: Urology;  Laterality: Left;   INGUINAL HERNIA REPAIR Bilateral 2004   INGUINAL HERNIA REPAIR Bilateral 2004   MOHS SURGERY Right    "temple"   SQUAMOUS CELL CARCINOMA EXCISION     chest or back or left arm   Social History   Occupational History   Occupation: band Pharmacist, hospital    Comment: retired  Tobacco Use   Smoking status: Former    Packs/day: 3.00    Years: 17.00    Total pack years: 51.00    Types: Cigarettes    Quit date: 03/30/1983    Years since quitting: 38.9    Passive exposure: Never   Smokeless tobacco: Never  Vaping Use   Vaping Use: Never used   Substance and Sexual Activity   Alcohol use: No    Alcohol/week: 0.0 standard drinks of alcohol    Comment: 05/30/2015 "recovering alcoholic; dry date 8/41/6606"   Drug use: No    Types: Marijuana, LSD, Mescaline    Comment: 03/07/2015 "stopped all drugs back in the 1980's"   Sexual activity: Not Currently    Birth control/protection:  None

## 2022-03-11 NOTE — Progress Notes (Signed)
Neck pain that started 3 weeks ago. Radiates down the left shoulder. Today it is primarily in the neck. Tylenol helps. Laying on his right side makes it worse.

## 2022-03-16 DIAGNOSIS — Z85828 Personal history of other malignant neoplasm of skin: Secondary | ICD-10-CM | POA: Diagnosis not present

## 2022-03-16 DIAGNOSIS — L570 Actinic keratosis: Secondary | ICD-10-CM | POA: Diagnosis not present

## 2022-03-16 DIAGNOSIS — L821 Other seborrheic keratosis: Secondary | ICD-10-CM | POA: Diagnosis not present

## 2022-03-16 DIAGNOSIS — L814 Other melanin hyperpigmentation: Secondary | ICD-10-CM | POA: Diagnosis not present

## 2022-03-17 ENCOUNTER — Encounter: Payer: Self-pay | Admitting: Physical Therapy

## 2022-03-17 ENCOUNTER — Other Ambulatory Visit: Payer: Self-pay

## 2022-03-17 ENCOUNTER — Ambulatory Visit: Payer: Medicare PPO | Admitting: Physical Therapy

## 2022-03-17 DIAGNOSIS — R293 Abnormal posture: Secondary | ICD-10-CM | POA: Diagnosis not present

## 2022-03-17 DIAGNOSIS — M542 Cervicalgia: Secondary | ICD-10-CM

## 2022-03-17 DIAGNOSIS — M25512 Pain in left shoulder: Secondary | ICD-10-CM | POA: Diagnosis not present

## 2022-03-17 DIAGNOSIS — M6281 Muscle weakness (generalized): Secondary | ICD-10-CM

## 2022-03-17 NOTE — Therapy (Signed)
OUTPATIENT PHYSICAL THERAPY CERVICAL EVALUATION Referring diagnosis? M54.2 (ICD-10-CM) - Neck pain Treatment diagnosis? (if different than referring diagnosis) M25.512 What was this (referring dx) caused by? '[]'$  Surgery '[]'$  Fall '[]'$  Ongoing issue '[x]'$  Arthritis '[x]'$  Other: __muscle strain__________  Laterality: '[]'$  Rt '[x]'$  Lt '[]'$  Both  Check all possible CPT codes:  *CHOOSE 10 OR LESS*    '[]'$  97110 (Therapeutic Exercise)  '[]'$  92507 (SLP Treatment)  '[]'$  93810 (Neuro Re-ed)   '[]'$  92526 (Swallowing Treatment)   '[]'$  97116 (Gait Training)   '[]'$  [id number] (Cognitive Training, 1st 15 minutes) '[]'$  97140 (Manual Therapy)   '[]'$  97130 (Cognitive Training, each add'l 15 minutes)  '[]'$  97164 (Re-evaluation)                              '[]'$  Other, List CPT Code ____________  '[]'$  97530 (Therapeutic Activities)     '[]'$  97535 (Self Care)   '[x]'$  All codes above (97110 - 97535)  '[]'$  97012 (Mechanical Traction)  '[x]'$  97014 (E-stim Unattended)  '[]'$  97032 (E-stim manual)  '[x]'$  97033 (Ionto)  '[]'$  97035 (Ultrasound) '[]'$  97750 (Physical Performance Training) '[]'$  [id number] (Aquatic Therapy) '[]'$  97016 (Vasopneumatic Device) '[]'$  [id number] (Paraffin) '[]'$  97034 (Contrast Bath) '[]'$  97597 (Wound Care 1st 20 sq cm) '[]'$  97598 (Wound Care each add'l 20 sq cm) '[]'$  97760 (Orthotic Fabrication, Fitting, Training Initial) '[]'$  [id number] (Prosthetic Management and Training Initial) '[]'$  [id number] (Orthotic or Prosthetic Training/ Modification Subsequent)    Patient Name: Roy Long MRN: [id number] DOB:1950-11-02, 71 y.o., male Today's Date: 03/17/2022  END OF SESSION:  PT End of Session - 03/17/22 1548     Visit Number 1    Number of Visits 4    Date for PT Re-Evaluation 04/28/22    Authorization Type [employer]    PT Start Time 1430    PT Stop Time 2778    PT Time Calculation (min) 46 min    Activity Tolerance Patient tolerated treatment well    Behavior During Therapy WFL for tasks assessed/performed             Past Medical History:  Diagnosis  Date   Age-related macular degeneration, dry, left eye    Alcohol dependence ([place])    Arthritis    "maybe a little bit in my left knee and right forefinger" (05/30/2015)   Atrial fibrillation ([place])    ablation 05-2015 with no reoccurance as of 09-2015   Basal cell carcinoma    Depression    hx   Family history of adverse reaction to anesthesia    "father had head injuries and dementia; everytime he was put under less of his mentation came back"   Gastroparesis    GERD (gastroesophageal reflux disease)    History of kidney stones    Hypercholesterolemia dx'd 02/2015   Hypertension    Hypothyroidism    Kidney stones    Migraine aura without headache    "very rare now" (05/30/2015)   Pneumonia ~ 2004   Squamous carcinoma    hand, chest   Past Surgical History:  Procedure Laterality Date   ANTERIOR CERVICAL DECOMP/DISCECTOMY FUSION  04/2009   ATRIAL FIBRILLATION ABLATION  05/30/2015   BACK SURGERY     BASAL CELL CARCINOMA EXCISION     chest or back or left arm   CARDIOVERSION  06/2002   CATARACT EXTRACTION W/ INTRAOCULAR LENS IMPLANT Left ~ 2012   COLONOSCOPY     COLONOSCOPY W/ POLYPECTOMY  02/2003  CYSTOSCOPY W/ STONE MANIPULATION  1980's   "basketted it out; no stent"   ELECTROPHYSIOLOGIC STUDY N/A 05/30/2015   Procedure: Atrial Fibrillation Ablation;  Surgeon: Will Meredith Leeds, MD;  Location: [facility];  Service: Cardiovascular;  Laterality: N/A;   EXTRACORPOREAL SHOCK WAVE LITHOTRIPSY Left 04/12/2016   Procedure: LEFT EXTRACORPOREAL SHOCK WAVE LITHOTRIPSY (ESWL);  Surgeon: Irine Seal, MD;  Location: [facility];  Service: Urology;  Laterality: Left;   INGUINAL HERNIA REPAIR Bilateral 2004   INGUINAL HERNIA REPAIR Bilateral 2004   MOHS SURGERY Right    "temple"   SQUAMOUS CELL CARCINOMA EXCISION     chest or back or left arm   Patient Active Problem List   Diagnosis Date Noted   Moderate episode of recurrent major depressive disorder ([place]) 01/19/2022   Hearing loss due  to cerumen impaction, bilateral 07/13/2021   Panlobular emphysema ([place]) 01/07/2021   Neuropathy involving both lower extremities 04/18/2020   Seasonal allergic rhinitis due to pollen 10/24/2018   Essential hypertension 04/25/2018   Laryngopharyngeal reflux (LPR) 02/07/2018   Kidney stone on left side 03/30/2016   IBS (irritable bowel syndrome) 06/27/2015   Coronary artery disease involving native coronary artery of native heart without angina pectoris 06/26/2015   OAB (overactive bladder) 04/16/2015   Atrial flutter ([place])    Hyperlipidemia with target LDL less than 70 01/18/2014   BPH (benign prostatic hyperplasia) 04/23/2011   Hypothyroidism 04/15/2011   Routine general medical examination at a health care facility 04/15/2011   Atrial fibrillation ([place])     PCP: Janith Lima, MD   REFERRING PROVIDER: Elba Barman, DO   REFERRING DIAG:  M54.2 (ICD-10-CM) - Neck pain  [phone] (ICD-10-CM) - Trapezius strain, left, initial encounter  M89.9 (ICD-10-CM) - Scapular dysfunction    THERAPY DIAG:  Acute pain of left shoulder  Cervicalgia  Muscle weakness (generalized)  Abnormal posture  Rationale for Evaluation and Treatment: Rehabilitation  ONSET DATE: 4 week onset of pain  SUBJECTIVE:                                                                                                                                                                                                         SUBJECTIVE STATEMENT: He says pain started about 4 weeks ago when he added more weight at the gym with bicep curls, and shoulder abd machine. He has been trying to get back in shape since covid. The pain is all left sided. It has improved some after injections and time away from gym  PERTINENT HISTORY:  cervical spondylosis with  a prior fusion at the C5-C7 level around 2009.   PAIN:  Are you having pain? Yes: NPRS scale: 3-6 currently/10 Pain location: left sided neck and upper trap Pain  description: tight.  Aggravating factors: any activity Relieving factors: rest, injections,   PRECAUTIONS: prior Cervical fusion  WEIGHT BEARING RESTRICTIONS: No  FALLS:  Has patient fallen in last 6 months? No    OCCUPATION: retired  PLOF: Independent  PATIENT GOALS: learn what exercises and stretches to do  NEXT MD VISIT:   OBJECTIVE:   DIAGNOSTIC FINDINGS:  "X-rays demonstrate a prior cervical fusion and from C5-C7. There is grade 1 anterior listhesis of C7 on T1. There is anterior spurring of C4 and notable facet sclerosis from C3-C5. No acute fracture noted. "  PATIENT SURVEYS:  FOTO 62% functional intake at eval  COGNITION: Overall cognitive status: Within functional limits for tasks assessed  SENSATION: WFL  POSTURE: rounded shoulders, has tendency to hike his shoulders  PALPATION: Trigger points and tightness in upper trap   CERVICAL ROM:   Active ROM A/PROM (deg) eval  Flexion 20  Extension 40  Right lateral flexion 30  Left lateral flexion 20  Right rotation 50  Left rotation 60   (Blank rows = not tested)  UPPER EXTREMITY STRENGTH:  MMT Right eval Left eval  Shoulder flexion 4 4  Shoulder extension    Shoulder abduction 4 4  Shoulder adduction    Shoulder extension    Shoulder internal rotation 4 4  Shoulder external rotation 4 4  Elbow flexion    Elbow extension    Wrist flexion    Wrist extension    Wrist ulnar deviation    Wrist radial deviation    Wrist pronation    Wrist supination     (Blank rows = not tested)  UPPER EXTREMITY ROM  AROM Right eval Left eval  Shoulder flexion WNL WNl  Shoulder extension    Shoulder abduction WNL WNl  Shoulder adduction    Shoulder extension    Shoulder internal rotation WNL WNL  Shoulder external rotation WNL WNL  Middle trapezius    Lower trapezius    Elbow flexion    Elbow extension    Wrist flexion    Wrist extension    Wrist ulnar deviation    Wrist radial deviation     Wrist pronation    Wrist supination    Grip strength     (Blank rows = not tested)  CERVICAL SPECIAL TESTS:  Spurling's test: Negative  FUNCTIONAL TESTS:     TODAY'S TREATMENT:  Eval HEP creation and review with demonstration and trial set preformed, see below for details Manual therapy for skilled palpation and Trigger Point Dry-Needling  Treatment instructions: Expect mild to moderate muscle soreness. Patient Consent Given: Yes Education handout provided: verbally provided Muscles treated: Lt sided Cervical P.S, and upper trap Treatment response/outcome: good overall tolerance,twitch response noted  Moist heat to neck after DN during HEP creation X 8 min   PATIENT EDUCATION: Education details: HEP, PT plan of care Person educated: Patient Education method: Explanation, Demonstration, Verbal cues, and Handouts Education comprehension: verbalized understanding and needs further education   HOME EXERCISE PROGRAM: Access Code: [email or web address] URL: [email or web address] Date: 03/17/2022 Prepared by: Elsie Ra  Exercises - Seated Upper Trap Stretch (Mirrored)  - 2-3 x daily - 6 x weekly - 1 sets - 3 reps - 20 sec hold - Seated Levator Scapulae Stretch  - 2-3 x daily - 6 x weekly -  3 reps - 20 sec hold - Standing Scapular Retraction  - 2-3 x daily - 6 x weekly - 1 sets - 10 reps - 5 sec hold - Standing Single Arm Shoulder Abduction with Resistance  - 1 x daily - 6 x weekly - 3 sets - 10 reps - Standing Shoulder Flexion with Resistance  - 1 x daily - 6 x weekly - 3 sets - 10 reps - Standing Shoulder External Rotation with Resistance  - 1 x daily - 6 x weekly - 1-3 sets - 10 reps - Standing Shoulder Horizontal Abduction with Resistance  - 1 x daily - 6 x weekly - 3 sets - 10 reps  ASSESSMENT:  CLINICAL IMPRESSION: Patient referred to PT for neck pain that presents with signs and symptoms of left upper trap stain. Patient will benefit from skilled PT to address  below impairments, limitations and improve overall function.  OBJECTIVE IMPAIRMENTS: decreased activity tolerance, decreased shoulder mobility, decreased ROM, decreased strength, impaired flexibility, impaired UE use, postural dysfunction, and pain.  ACTIVITY LIMITATIONS: reaching, lifting, carry,  cleaning, driving, and or occupation  PERSONAL FACTORS: cervical spondylosis with a prior fusion at the C5-C7 level.  also affecting patient's functional outcome.  REHAB POTENTIAL: Good  CLINICAL DECISION MAKING: Stable/uncomplicated  EVALUATION COMPLEXITY: Low    GOALS: Short term PT Goals Target date: 04/14/2022   Pt will be I and compliant with HEP. Baseline:  Goal status: New Pt will decrease pain by 25% overall Baseline: Goal status: New  Long term PT goals Target date:04/28/2022   Pt will improve neck AROM to [facility] to improve functional scanning Baseline: Goal status: New Pt will improve  Rt shoulder strength to at least 4+/5 MMT to improve functional strength Baseline: Goal status: New Pt will improve FOTO to at least 70% functional to show improved function Baseline: Goal status: New Pt will reduce pain to overall less than 3/10 with usual activity and gym activity Baseline: Goal status: New  PLAN: PT FREQUENCY: 1 times per week   PT DURATION: 6 weeks  PLANNED INTERVENTIONS (unless contraindicated): aquatic PT, Canalith repositioning, cryotherapy, Electrical stimulation, Iontophoresis with 4 mg/ml dexamethasome, Moist heat, traction, Ultrasound, gait training, Therapeutic exercise, balance training, neuromuscular re-education, patient/family education, prosthetic training, manual techniques, passive ROM, dry needling, taping, vasopnuematic device, vestibular, spinal manipulations, joint manipulations  PLAN FOR NEXT SESSION: review HEP    Debbe Odea, PT,DPT 03/17/2022, 3:49 PM

## 2022-03-30 ENCOUNTER — Encounter: Payer: Self-pay | Admitting: Physical Therapy

## 2022-03-30 ENCOUNTER — Ambulatory Visit: Payer: Medicare PPO | Admitting: Physical Therapy

## 2022-03-30 DIAGNOSIS — M6281 Muscle weakness (generalized): Secondary | ICD-10-CM

## 2022-03-30 DIAGNOSIS — M542 Cervicalgia: Secondary | ICD-10-CM

## 2022-03-30 DIAGNOSIS — R293 Abnormal posture: Secondary | ICD-10-CM | POA: Diagnosis not present

## 2022-03-30 DIAGNOSIS — M25512 Pain in left shoulder: Secondary | ICD-10-CM

## 2022-03-30 NOTE — Therapy (Signed)
OUTPATIENT PHYSICAL THERAPY TREATMENT NOTE   Patient Name: Roy Long MRN: [id number] DOB:1950-12-09, 72 y.o., male Today's Date: 03/30/2022  PCP: Janith Lima, MD   REFERRING PROVIDER: Elba Barman, DO   END OF SESSION:   PT End of Session - 03/30/22 1036     Visit Number 2    Number of Visits 4    Date for PT Re-Evaluation 04/28/22    Authorization Type [employer]    PT Start Time 0930    PT Stop Time 1020    PT Time Calculation (min) 50 min    Activity Tolerance Patient tolerated treatment well    Behavior During Therapy WFL for tasks assessed/performed             Past Medical History:  Diagnosis Date   Age-related macular degeneration, dry, left eye    Alcohol dependence ([place])    Arthritis    "maybe a little bit in my left knee and right forefinger" (05/30/2015)   Atrial fibrillation ([place])    ablation 05-2015 with no reoccurance as of 09-2015   Basal cell carcinoma    Depression    hx   Family history of adverse reaction to anesthesia    "father had head injuries and dementia; everytime he was put under less of his mentation came back"   Gastroparesis    GERD (gastroesophageal reflux disease)    History of kidney stones    Hypercholesterolemia dx'd 02/2015   Hypertension    Hypothyroidism    Kidney stones    Migraine aura without headache    "very rare now" (05/30/2015)   Pneumonia ~ 2004   Squamous carcinoma    hand, chest   Past Surgical History:  Procedure Laterality Date   ANTERIOR CERVICAL DECOMP/DISCECTOMY FUSION  04/2009   ATRIAL FIBRILLATION ABLATION  05/30/2015   BACK SURGERY     BASAL CELL CARCINOMA EXCISION     chest or back or left arm   CARDIOVERSION  06/2002   CATARACT EXTRACTION W/ INTRAOCULAR LENS IMPLANT Left ~ 2012   COLONOSCOPY     COLONOSCOPY W/ POLYPECTOMY  02/2003   CYSTOSCOPY W/ STONE MANIPULATION  1980's   "basketted it out; no stent"   ELECTROPHYSIOLOGIC STUDY N/A 05/30/2015   Procedure: Atrial Fibrillation Ablation;  Surgeon:  Will Meredith Leeds, MD;  Location: [facility];  Service: Cardiovascular;  Laterality: N/A;   EXTRACORPOREAL SHOCK WAVE LITHOTRIPSY Left 04/12/2016   Procedure: LEFT EXTRACORPOREAL SHOCK WAVE LITHOTRIPSY (ESWL);  Surgeon: Irine Seal, MD;  Location: [facility];  Service: Urology;  Laterality: Left;   INGUINAL HERNIA REPAIR Bilateral 2004   INGUINAL HERNIA REPAIR Bilateral 2004   MOHS SURGERY Right    "temple"   SQUAMOUS CELL CARCINOMA EXCISION     chest or back or left arm   Patient Active Problem List   Diagnosis Date Noted   Moderate episode of recurrent major depressive disorder ([place]) 01/19/2022   Hearing loss due to cerumen impaction, bilateral 07/13/2021   Panlobular emphysema ([place]) 01/07/2021   Neuropathy involving both lower extremities 04/18/2020   Seasonal allergic rhinitis due to pollen 10/24/2018   Essential hypertension 04/25/2018   Laryngopharyngeal reflux (LPR) 02/07/2018   Kidney stone on left side 03/30/2016   IBS (irritable bowel syndrome) 06/27/2015   Coronary artery disease involving native coronary artery of native heart without angina pectoris 06/26/2015   OAB (overactive bladder) 04/16/2015   Atrial flutter ([place])    Hyperlipidemia with target LDL less than 70 01/18/2014  BPH (benign prostatic hyperplasia) 04/23/2011   Hypothyroidism 04/15/2011   Routine general medical examination at a health care facility 04/15/2011   Atrial fibrillation ([facility])     REFERRING DIAG:  M54.2 (ICD-10-CM) - Neck pain  S46.812A (ICD-10-CM) - Trapezius strain, left, initial encounter  M89.9 (ICD-10-CM) - Scapular dysfunction    THERAPY DIAG:  Acute pain of left shoulder  Cervicalgia  Muscle weakness (generalized)  Abnormal posture  Rationale for Evaluation and Treatment Rehabilitation  PERTINENT HISTORY: cervical 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   PRECAUTIONS: prior cervical fusion  SUBJECTIVE:                                                                                                                                                                                       SUBJECTIVE STATEMENT:   Pt arriving today reporting 1/10 pain  in left side scapula area.  Pt stating he feels like he has had a set back waking up on Saturday with pain worsened.    PAIN:  Are you having pain? Yes, 1/10 at rest.    OBJECTIVE: (objective measures completed at initial evaluation unless otherwise dated)   DIAGNOSTIC FINDINGS:  "X-rays demonstrate a prior cervical fusion and from C5-C7. There is grade 1 anterior listhesis of C7 on T1. There is anterior spurring of C4 and notable facet sclerosis from C3-C5. No acute fracture noted. "   PATIENT SURVEYS:  FOTO 62% functional intake at eval   COGNITION: Overall cognitive status: Within functional limits for tasks assessed   SENSATION: WFL   POSTURE: rounded shoulders, has tendency to hike his shoulders   PALPATION: Trigger points and tightness in upper trap             CERVICAL ROM:    Active ROM A/PROM (deg) eval  Flexion 20  Extension 40  Right lateral flexion 30  Left lateral flexion 20  Right rotation 50  Left rotation 60   (Blank rows = not tested)   UPPER EXTREMITY STRENGTH:   MMT Right eval Left eval  Shoulder flexion 4 4  Shoulder extension      Shoulder abduction 4 4  Shoulder adduction      Shoulder extension      Shoulder internal rotation 4 4  Shoulder external rotation 4 4  Elbow flexion      Elbow extension      Wrist flexion      Wrist extension      Wrist ulnar deviation      Wrist radial deviation      Wrist pronation      Wrist supination       (  Blank rows = not tested)   UPPER EXTREMITY ROM   AROM Right eval Left eval  Shoulder flexion WNL WNl  Shoulder extension      Shoulder abduction WNL WNl  Shoulder adduction      Shoulder extension      Shoulder internal rotation WNL WNL  Shoulder external rotation WNL WNL  Middle  trapezius      Lower trapezius      Elbow flexion      Elbow extension      Wrist flexion      Wrist extension      Wrist ulnar deviation      Wrist radial deviation      Wrist pronation      Wrist supination      Grip strength       (Blank rows = not tested)   CERVICAL SPECIAL TESTS:  Spurling's test: Negative   FUNCTIONAL TESTS:        TODAY'S TREATMENT:  03/30/22:  UBE: Level 2 x 5 minutes  Rows: level 2 band x 15 holding 3 sec Money: ER elbows flexed 90 deg Level 2 band x 15 Bil Shoulder Abduction: Level 2 band x 15  Shoulder flexion: level 2 band x 15 each UE Seated Upper Trap stretch x 5 holding 10 sec  Levator stretch : x 5 holding 10 seconds Manual: Skilled palpation and Trigger Point Dry-Needling  Treatment instructions: Expect mild to moderate muscle soreness. Patient Consent Given: Yes Education handout provided: verbally provided Muscles treated: bilateral cervical paraspinals and left  upper trap Treatment response/outcome: good overall tolerance,twitch response noted  Modalities:  Moist heat to cervical spine following DN x 5  min    Eval HEP creation and review with demonstration and trial set preformed, see below for details Manual therapy for skilled palpation and Trigger Point Dry-Needling  Treatment instructions: Expect mild to moderate muscle soreness. Patient Consent Given: Yes Education handout provided: verbally provided Muscles treated: Lt sided Cervical P.S, and upper trap Treatment response/outcome: good overall tolerance,twitch response noted  Moist heat to neck after DN during HEP creation X 8 min     PATIENT EDUCATION: Education details: HEP, PT plan of care Person educated: Patient Education method: Explanation, Demonstration, Verbal cues, and Handouts Education comprehension: verbalized understanding and needs further education     HOME EXERCISE PROGRAM: Access Code: [email or web address] URL: [email or web address] Date:  03/17/2022 Prepared by: Elsie Ra   Exercises - Seated Upper Trap Stretch (Mirrored)  - 2-3 x daily - 6 x weekly - 1 sets - 3 reps - 20 sec hold - Seated Levator Scapulae Stretch  - 2-3 x daily - 6 x weekly - 3 reps - 20 sec hold - Standing Scapular Retraction  - 2-3 x daily - 6 x weekly - 1 sets - 10 reps - 5 sec hold - Standing Single Arm Shoulder Abduction with Resistance  - 1 x daily - 6 x weekly - 3 sets - 10 reps - Standing Shoulder Flexion with Resistance  - 1 x daily - 6 x weekly - 3 sets - 10 reps - Standing Shoulder External Rotation with Resistance  - 1 x daily - 6 x weekly - 1-3 sets - 10 reps - Standing Shoulder Horizontal Abduction with Resistance  - 1 x daily - 6 x weekly - 3 sets - 10 reps   ASSESSMENT:   CLINICAL IMPRESSION Pt reporting good response to TPDN at last visit. Pt stating his pain decreased for 2-3 days  but then he woke up over the weekend with his pain worse. Pt currently is 1/10 at rest and states it's more soreness. Pt's HEP was reviewed and pt able to return demonstration. We also discussed performing the HEP on bilateral sides for maximum benefits. Pt was edu in self STM using a tennis ball. TPDN performed again with decreased stiffness at end of session. Continue skilled PT to maximize pt's function.    OBJECTIVE IMPAIRMENTS: decreased activity tolerance, decreased shoulder mobility, decreased ROM, decreased strength, impaired flexibility, impaired UE use, postural dysfunction, and pain.   ACTIVITY LIMITATIONS: reaching, lifting, carry,  cleaning, driving, and or occupation   PERSONAL FACTORS: cervical spondylosis with a prior fusion at the C5-C7 level.  also affecting patient's functional outcome.   REHAB POTENTIAL: Good   CLINICAL DECISION MAKING: Stable/uncomplicated   EVALUATION COMPLEXITY: Low       GOALS: Short term PT Goals Target date: 04/14/2022     Pt will be I and compliant with HEP. Baseline:  Goal status: New Pt will decrease  pain by 25% overall Baseline: Goal status: New   Long term PT goals Target date:04/28/2022     Pt will improve neck AROM to [facility] to improve functional scanning Baseline: Goal status: New Pt will improve  Rt shoulder strength to at least 4+/5 MMT to improve functional strength Baseline: Goal status: New Pt will improve FOTO to at least 70% functional to show improved function Baseline: Goal status: New Pt will reduce pain to overall less than 3/10 with usual activity and gym activity Baseline: Goal status: New   PLAN: PT FREQUENCY: 1 times per week    PT DURATION: 6 weeks   PLANNED INTERVENTIONS (unless contraindicated): aquatic PT, Canalith repositioning, cryotherapy, Electrical stimulation, Iontophoresis with 4 mg/ml dexamethasome, Moist heat, traction, Ultrasound, gait training, Therapeutic exercise, balance training, neuromuscular re-education, patient/family education, prosthetic training, manual techniques, passive ROM, dry needling, taping, vasopnuematic device, vestibular, spinal manipulations, joint manipulations   PLAN FOR NEXT SESSION: Assess response to DN,  cervical ROM, general strengthening, Progress pt's HEP as appropriate,        Oretha Caprice, PT, MPT 03/30/2022, 10:37 AM

## 2022-04-02 ENCOUNTER — Other Ambulatory Visit: Payer: Self-pay | Admitting: Internal Medicine

## 2022-04-02 ENCOUNTER — Other Ambulatory Visit: Payer: Self-pay | Admitting: Cardiology

## 2022-04-02 DIAGNOSIS — E039 Hypothyroidism, unspecified: Secondary | ICD-10-CM

## 2022-04-02 DIAGNOSIS — K219 Gastro-esophageal reflux disease without esophagitis: Secondary | ICD-10-CM

## 2022-04-02 NOTE — Telephone Encounter (Signed)
Prescription refill request for Eliquis received. Indication:afib Last office visit:8/23 Scr:0.8 Age: 72 Weight:62.3  kg  Prescription refilled

## 2022-04-07 ENCOUNTER — Encounter: Payer: Self-pay | Admitting: Physical Therapy

## 2022-04-07 ENCOUNTER — Ambulatory Visit: Payer: Medicare PPO | Admitting: Physical Therapy

## 2022-04-07 DIAGNOSIS — M6281 Muscle weakness (generalized): Secondary | ICD-10-CM | POA: Diagnosis not present

## 2022-04-07 DIAGNOSIS — M542 Cervicalgia: Secondary | ICD-10-CM

## 2022-04-07 DIAGNOSIS — R293 Abnormal posture: Secondary | ICD-10-CM | POA: Diagnosis not present

## 2022-04-07 DIAGNOSIS — M25512 Pain in left shoulder: Secondary | ICD-10-CM

## 2022-04-07 NOTE — Therapy (Signed)
OUTPATIENT PHYSICAL THERAPY TREATMENT NOTE   Patient Name: Roy Long MRN: [id number] DOB:1950-05-05, 72 y.o., male Today's Date: 04/07/2022  PCP: Janith Lima, MD   REFERRING PROVIDER: Elba Barman, DO   END OF SESSION:   PT End of Session - 04/07/22 1022     Visit Number 3    Number of Visits 4    Date for PT Re-Evaluation 04/28/22    Authorization Type [employer]    PT Start Time 0930    PT Stop Time 1010    PT Time Calculation (min) 40 min    Activity Tolerance Patient tolerated treatment well    Behavior During Therapy WFL for tasks assessed/performed              Past Medical History:  Diagnosis Date   Age-related macular degeneration, dry, left eye    Alcohol dependence ([place])    Arthritis    "maybe a little bit in my left knee and right forefinger" (05/30/2015)   Atrial fibrillation ([place])    ablation 05-2015 with no reoccurance as of 09-2015   Basal cell carcinoma    Depression    hx   Family history of adverse reaction to anesthesia    "father had head injuries and dementia; everytime he was put under less of his mentation came back"   Gastroparesis    GERD (gastroesophageal reflux disease)    History of kidney stones    Hypercholesterolemia dx'd 02/2015   Hypertension    Hypothyroidism    Kidney stones    Migraine aura without headache    "very rare now" (05/30/2015)   Pneumonia ~ 2004   Squamous carcinoma    hand, chest   Past Surgical History:  Procedure Laterality Date   ANTERIOR CERVICAL DECOMP/DISCECTOMY FUSION  04/2009   ATRIAL FIBRILLATION ABLATION  05/30/2015   BACK SURGERY     BASAL CELL CARCINOMA EXCISION     chest or back or left arm   CARDIOVERSION  06/2002   CATARACT EXTRACTION W/ INTRAOCULAR LENS IMPLANT Left ~ 2012   COLONOSCOPY     COLONOSCOPY W/ POLYPECTOMY  02/2003   CYSTOSCOPY W/ STONE MANIPULATION  1980's   "basketted it out; no stent"   ELECTROPHYSIOLOGIC STUDY N/A 05/30/2015   Procedure: Atrial Fibrillation Ablation;   Surgeon: Will Meredith Leeds, MD;  Location: [facility];  Service: Cardiovascular;  Laterality: N/A;   EXTRACORPOREAL SHOCK WAVE LITHOTRIPSY Left 04/12/2016   Procedure: LEFT EXTRACORPOREAL SHOCK WAVE LITHOTRIPSY (ESWL);  Surgeon: Irine Seal, MD;  Location: [facility];  Service: Urology;  Laterality: Left;   INGUINAL HERNIA REPAIR Bilateral 2004   INGUINAL HERNIA REPAIR Bilateral 2004   MOHS SURGERY Right    "temple"   SQUAMOUS CELL CARCINOMA EXCISION     chest or back or left arm   Patient Active Problem List   Diagnosis Date Noted   Moderate episode of recurrent major depressive disorder ([place]) 01/19/2022   Hearing loss due to cerumen impaction, bilateral 07/13/2021   Panlobular emphysema ([place]) 01/07/2021   Neuropathy involving both lower extremities 04/18/2020   Seasonal allergic rhinitis due to pollen 10/24/2018   Essential hypertension 04/25/2018   Laryngopharyngeal reflux (LPR) 02/07/2018   Kidney stone on left side 03/30/2016   IBS (irritable bowel syndrome) 06/27/2015   Coronary artery disease involving native coronary artery of native heart without angina pectoris 06/26/2015   OAB (overactive bladder) 04/16/2015   Atrial flutter ([place])    Hyperlipidemia with target LDL less than 70  01/18/2014   BPH (benign prostatic hyperplasia) 04/23/2011   Hypothyroidism 04/15/2011   Routine general medical examination at a health care facility 04/15/2011   Atrial fibrillation (H[facility])     REFERRING DIAG:  M54.2 (ICD-10-CM) - Neck pain  S46.812A (ICD-10-CM) - Trapezius strain, left, initial encounter  M89.9 (ICD-10-CM) - Scapular dysfunction    THERAPY DIAG:  Acute pain of left shoulder  Cervicalgia  Muscle weakness (generalized)  Abnormal posture  Rationale for Evaluation and Treatment Rehabilitation  PERTINENT HISTORY: cervical 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   PRECAUTIONS: prior cervical fusion  SUBJECTIVE:                                                                                                                                                                                       SUBJECTIVE STATEMENT:   Pt arriving today stating that the pain comes and goes, he improves and then gets set backs with pain, one was just waking up with the pain, another was when he was working with garden hoses.     PAIN:  Are you having pain? Yes, 1/10 at rest.    OBJECTIVE: (objective measures completed at initial evaluation unless otherwise dated)   DIAGNOSTIC FINDINGS:  "X-rays demonstrate a prior cervical fusion and from C5-C7. There is grade 1 anterior listhesis of C7 on T1. There is anterior spurring of C4 and notable facet sclerosis from C3-C5. No acute fracture noted. "   PATIENT SURVEYS:  FOTO 62% functional intake at eval   COGNITION: Overall cognitive status: Within functional limits for tasks assessed   SENSATION: WFL   POSTURE: rounded shoulders, has tendency to hike his shoulders   PALPATION: Trigger points and tightness in upper trap             CERVICAL ROM:    Active ROM A/PROM (deg) eval  Flexion 20  Extension 40  Right lateral flexion 30  Left lateral flexion 20  Right rotation 50  Left rotation 60   (Blank rows = not tested)   UPPER EXTREMITY STRENGTH:   MMT Right eval Left eval Bilat 04/07/22   Shoulder flexion '4 4 5  '$ Shoulder extension       Shoulder abduction '4 4 5  '$ Shoulder adduction       Shoulder extension       Shoulder internal rotation '4 4 5  '$ Shoulder external rotation '4 4 5  '$ Elbow flexion       Elbow extension       Wrist flexion       Wrist extension       Wrist ulnar deviation  Wrist radial deviation       Wrist pronation       Wrist supination        (Blank rows = not tested)   UPPER EXTREMITY ROM   AROM Right eval Left eval  Shoulder flexion WNL WNl  Shoulder extension      Shoulder abduction WNL WNl  Shoulder adduction      Shoulder extension       Shoulder internal rotation WNL WNL  Shoulder external rotation WNL WNL  Middle trapezius      Lower trapezius      Elbow flexion      Elbow extension      Wrist flexion      Wrist extension      Wrist ulnar deviation      Wrist radial deviation      Wrist pronation      Wrist supination      Grip strength       (Blank rows = not tested)   CERVICAL SPECIAL TESTS:  Spurling's test: Negative   FUNCTIONAL TESTS:        TODAY'S TREATMENT:  04/07/22:   Manual: Ischemic compression and pin and stretch to left upper trap Skilled palpation and Trigger Point Dry-Needling  Treatment instructions: Expect mild to moderate muscle soreness. Patient Consent Given: Yes Education handout provided: verbally provided Muscles treated:left upper trap, mid trap, infraspinatus Treatment response/outcome: good overall tolerance,twitch response noted   Self care: recommendation of theracane with education provided on how to use and where to attain one. Recommendation to add push ups plus exercise for serratus anterior strengthening to decrease scapular winging.     PATIENT EDUCATION: Education details: HEP, PT plan of care, theracane Person educated: Patient Education method: Explanation, Demonstration, Verbal cues, and Handouts Education comprehension: verbalized understanding and needs further education     HOME EXERCISE PROGRAM: Access Code: [email or web address] URL: [email or web address] Date: 03/17/2022 Prepared by: Elsie Ra   Exercises - Seated Upper Trap Stretch (Mirrored)  - 2-3 x daily - 6 x weekly - 1 sets - 3 reps - 20 sec hold - Seated Levator Scapulae Stretch  - 2-3 x daily - 6 x weekly - 3 reps - 20 sec hold - Standing Scapular Retraction  - 2-3 x daily - 6 x weekly - 1 sets - 10 reps - 5 sec hold - Standing Single Arm Shoulder Abduction with Resistance  - 1 x daily - 6 x weekly - 3 sets - 10 reps - Standing Shoulder Flexion with Resistance  - 1 x daily - 6 x weekly  - 3 sets - 10 reps - Standing Shoulder External Rotation with Resistance  - 1 x daily - 6 x weekly - 1-3 sets - 10 reps - Standing Shoulder Horizontal Abduction with Resistance  - 1 x daily - 6 x weekly - 3 sets - 10 reps -Push ups plus 10 sets   ASSESSMENT:   CLINICAL IMPRESSION He has made some progress with his overall shoulder strength and pain but he still gets set backs in pain. He does appear to get good relief from DN which was preformed today combined with ischemic compression and pin and stretch techniques to his Lt upper trap. He will follow up with MD tomorrow. I did recommend he get theracane for self trigger point release techniques he can perform at home.     OBJECTIVE IMPAIRMENTS: decreased activity tolerance, decreased shoulder mobility, decreased ROM, decreased strength, impaired flexibility, impaired UE use, postural dysfunction,  and pain.   ACTIVITY LIMITATIONS: reaching, lifting, carry,  cleaning, driving, and or occupation   PERSONAL FACTORS: cervical spondylosis with a prior fusion at the C5-C7 level.  also affecting patient's functional outcome.   REHAB POTENTIAL: Good   CLINICAL DECISION MAKING: Stable/uncomplicated   EVALUATION COMPLEXITY: Low       GOALS: Short term PT Goals Target date: 04/14/2022     Pt will be I and compliant with HEP. Baseline:  Goal status: New Pt will decrease pain by 25% overall Baseline: Goal status: New   Long term PT goals Target date:04/28/2022     Pt will improve neck AROM to [facility] to improve functional scanning Baseline: Goal status: New Pt will improve  Rt shoulder strength to at least 4+/5 MMT to improve functional strength Baseline: Goal status: New Pt will improve FOTO to at least 70% functional to show improved function Baseline: Goal status: New Pt will reduce pain to overall less than 3/10 with usual activity and gym activity Baseline: Goal status: New   PLAN: PT FREQUENCY: 1 times per week    PT  DURATION: 6 weeks   PLANNED INTERVENTIONS (unless contraindicated): aquatic PT, Canalith repositioning, cryotherapy, Electrical stimulation, Iontophoresis with 4 mg/ml dexamethasome, Moist heat, traction, Ultrasound, gait training, Therapeutic exercise, balance training, neuromuscular re-education, patient/family education, prosthetic training, manual techniques, passive ROM, dry needling, taping, vasopnuematic device, vestibular, spinal manipulations, joint manipulations   PLAN FOR NEXT SESSION: what did MD say, DN if desired.       Debbe Odea, PT, DPT 04/07/2022, 10:23 AM

## 2022-04-08 ENCOUNTER — Ambulatory Visit: Payer: Medicare PPO | Admitting: Sports Medicine

## 2022-04-08 ENCOUNTER — Encounter: Payer: Self-pay | Admitting: Sports Medicine

## 2022-04-08 DIAGNOSIS — M62838 Other muscle spasm: Secondary | ICD-10-CM | POA: Diagnosis not present

## 2022-04-08 DIAGNOSIS — Z981 Arthrodesis status: Secondary | ICD-10-CM | POA: Diagnosis not present

## 2022-04-08 DIAGNOSIS — M542 Cervicalgia: Secondary | ICD-10-CM | POA: Diagnosis not present

## 2022-04-08 MED ORDER — BUPIVACAINE HCL 0.25 % IJ SOLN
1.0000 mL | INTRAMUSCULAR | Status: AC | PRN
  Administered 2022-04-08: 1 mL

## 2022-04-08 MED ORDER — METHYLPREDNISOLONE ACETATE 40 MG/ML IJ SUSP
40.0000 mg | INTRAMUSCULAR | Status: AC | PRN
  Administered 2022-04-08: 40 mg via INTRAMUSCULAR

## 2022-04-08 MED ORDER — LIDOCAINE HCL 1 % IJ SOLN
1.0000 mL | INTRAMUSCULAR | Status: AC | PRN
  Administered 2022-04-08: 1 mL

## 2022-04-08 NOTE — Progress Notes (Signed)
Roy Long - 72 y.o. male MRN [id number]  Date of birth: 1950-12-13  Office Visit Note: Visit Date: 04/08/2022 PCP: Janith Lima, MD Referred by: Janith Lima, MD  Subjective: Chief Complaint  Patient presents with   Neck - Follow-up   HPI: Roy Long is a pleasant 72 y.o. male who presents today for left sided neck pain and trapezius pain.  He continues with some left-sided neck pain that radiates down into the trapezius and upper shoulder.  His pain certainly got better after our trigger point injections.  It has waxed and waned, he has done 3 sessions of formalized physical therapy.  At his last physical therapy session they did do dry needling and deep tissue massage which states helped tremendously.  He is not taking any medication for this.  Sometimes uses heat which feels good.  He denies any pain radiating past the shoulder, no numbness or tingling down the arm or into the fingers.  He denies any weakness of the upper extremity.  As reminder, he has had a prior ACDF at the C5-C7 level.  Pertinent ROS were reviewed with the patient and found to be negative unless otherwise specified above in HPI.   Assessment & Plan: Visit Diagnoses:  1. Neck pain   2. Trapezius muscle spasm   3. History of fusion of cervical spine    Plan: I discussed with Roy Long today his neck and trapezius pain, he certainly has muscular compensation that has responded temporarily to trigger point injections and manual therapy at PT.  He does have cervical spondylosis with a prior fusion at the C5-C7 level, his pain is located over the left lateral neck and trap muscle, I would like him to see my spine partner, Dr. Laurance Flatten, to see if he feels this is adjacent segment disease superiorly is in his neck pain.  In the meantime, he will continue formalized physical therapy as he is finding this helpful.  We did repeat trigger point injections into the trapezius and medial scapular border today which he  tolerated well.  Recommended continuing postural adjustments as well as heat and topical Arnica or Tiger balm for pain control.  Will avoid NSAIDs given his Eliquis use.  He will follow-up with me after seeing Dr. Laurance Flatten if his pain is not getting better.  Follow-up: Return for Schedule appointment with Dr. Laurance Flatten for neck pain.   Meds & Orders: No orders of the defined types were placed in this encounter.   Orders Placed This Encounter  Procedures   Trigger Point Inj     Procedures: Trigger Point Inj  Date/Time: 04/08/2022 11:21 AM  Performed by: Elba Barman, DO Authorized by: Elba Barman, DO   Consent Given by:  Patient Site marked: the procedure site was marked   Timeout: prior to procedure the correct patient, procedure, and site was verified   Indications:  Pain and muscle spasm Total # of Trigger Points:  3 or more Location: neck and shoulder   Needle Size:  27 G Approach:  Dorsal Medications #1:  1 mL lidocaine 1 % Medications #2:  1 mL bupivacaine 0.25 % Medications #3:  40 mg methylPREDNISolone acetate 40 MG/ML Patient tolerance:  Patient tolerated the procedure well with no immediate complications Comments: Procedure: Trigger point injections, left trapezius and scapular musculature After discussion on R/B/I and informed verbal consent was obtained, a timeout was conducted. The patient was placed in a prone position on the examination table and the areas of  maximal tenderness was identified over the left.  The 3 areas were cleansed with multiple alcohol swabs. Ethyl chloride was used for local anesthesia. Using a 27-gauge 1 inch needle the trigger point(s) was subsequently injected with a mixture of 1 cc of methylprednisolone 40 mg/mL and 1 cc of 1% lidocaine without epinephrine, and 1 cc of bupivicaine 0.25% with a total of 1 cc of injectate into each trigger point. A band-aid was applied following. Patient tolerated procedure well, there were no post-injection complications.  Post-procedure instructions were given.         Clinical History: No specialty comments available.  He reports that he quit smoking about 39 years ago. His smoking use included cigarettes. He has a 51.00 pack-year smoking history. He has never been exposed to tobacco smoke. He has never used smokeless tobacco. No results for input(s): "HGBA1C", "LABURIC" in the last 8760 hours.  Objective:   Vital Signs: There were no vitals taken for this visit.  Physical Exam  Gen: Well-appearing, in no acute distress; non-toxic CV: Regular Rate. Well-perfused. Warm.  Resp: Breathing unlabored on room air; no wheezing. Psych: Fluid speech in conversation; appropriate affect; normal thought process Neuro: Sensation intact throughout. No gross coordination deficits.   Ortho Exam - Cervical: There is no midline spinous process TTP.  Slight restriction in extension and flexion from his ACDF.  There is left-sided cervical paraspinal and trapezius hypertonicity notable.  There is 5/5 strength of the upper extremities in the C5-T1 nerve root distribution bilaterally.  Negative Spurling's maneuver.  There are multiple trigger points within the mid belly of the trapezius and along the medial border of the scapular musculature.  - Left shoulder: No bony TTP.  He has full range of motion in all directions both active and passively.  5/5 strength.  Rotator cuff testing appears intact.  Negative provocative maneuvers.  Imaging:  XR Cervical Spine With Flex & Extend   Result Date: 03/11/2022 4 views of the cervical spine including AP, lateral and flexion-extension views were ordered and reviewed by myself.  X-rays demonstrate a prior cervical fusion and from C5-C7.  There is grade 1 anterior listhesis of C7 on T1.  There is anterior spurring of C4 and notable facet sclerosis from C3-C5.  No acute fracture noted.   Past Medical/Family/Surgical/Social History: Medications & Allergies reviewed per EMR, new  medications updated. Patient Active Problem List   Diagnosis Date Noted   Moderate episode of recurrent major depressive disorder ([place]) 01/19/2022   Hearing loss due to cerumen impaction, bilateral 07/13/2021   Panlobular emphysema ([place]) 01/07/2021   Neuropathy involving both lower extremities 04/18/2020   Seasonal allergic rhinitis due to pollen 10/24/2018   Essential hypertension 04/25/2018   Laryngopharyngeal reflux (LPR) 02/07/2018   Kidney stone on left side 03/30/2016   IBS (irritable bowel syndrome) 06/27/2015   Coronary artery disease involving native coronary artery of native heart without angina pectoris 06/26/2015   OAB (overactive bladder) 04/16/2015   Atrial flutter ([place])    Hyperlipidemia with target LDL less than 70 01/18/2014   BPH (benign prostatic hyperplasia) 04/23/2011   Hypothyroidism 04/15/2011   Routine general medical examination at a health care facility 04/15/2011   Atrial fibrillation [facility])    Past Medical History:  Diagnosis Date   Age-related macular degeneration, dry, left eye    Alcohol dependence ([place])    Arthritis    "maybe a little bit in my left knee and right forefinger" (05/30/2015)   Atrial fibrillation ([place])  ablation 05-2015 with no reoccurance as of 09-2015   Basal cell carcinoma    Depression    hx   Family history of adverse reaction to anesthesia    "father had head injuries and dementia; everytime he was put under less of his mentation came back"   Gastroparesis    GERD (gastroesophageal reflux disease)    History of kidney stones    Hypercholesterolemia dx'd 02/2015   Hypertension    Hypothyroidism    Kidney stones    Migraine aura without headache    "very rare now" (05/30/2015)   Pneumonia ~ 2004   Squamous carcinoma    hand, chest   Family History  Problem Relation Age of Onset   Stroke Brother    Cancer Brother        basosquamous cell carcinoma-head   Heart failure Mother    Colon cancer Cousin 85   Heart disease Neg  Hx    Hyperlipidemia Neg Hx    Hypertension Neg Hx    Diabetes Neg Hx    Past Surgical History:  Procedure Laterality Date   ANTERIOR CERVICAL DECOMP/DISCECTOMY FUSION  04/2009   ATRIAL FIBRILLATION ABLATION  05/30/2015   BACK SURGERY     BASAL CELL CARCINOMA EXCISION     chest or back or left arm   CARDIOVERSION  06/2002   CATARACT EXTRACTION W/ INTRAOCULAR LENS IMPLANT Left ~ 2012   COLONOSCOPY     COLONOSCOPY W/ POLYPECTOMY  02/2003   CYSTOSCOPY W/ STONE MANIPULATION  1980's   "basketted it out; no stent"   ELECTROPHYSIOLOGIC STUDY N/A 05/30/2015   Procedure: Atrial Fibrillation Ablation;  Surgeon: Will Meredith Leeds, MD;  Location: [facility];  Service: Cardiovascular;  Laterality: N/A;   EXTRACORPOREAL SHOCK WAVE LITHOTRIPSY Left 04/12/2016   Procedure: LEFT EXTRACORPOREAL SHOCK WAVE LITHOTRIPSY (ESWL);  Surgeon: Irine Seal, MD;  Location: [facility];  Service: Urology;  Laterality: Left;   INGUINAL HERNIA REPAIR Bilateral 2004   INGUINAL HERNIA REPAIR Bilateral 2004   MOHS SURGERY Right    "temple"   SQUAMOUS CELL CARCINOMA EXCISION     chest or back or left arm   Social History   Occupational History   Occupation: band Pharmacist, hospital    Comment: retired  Tobacco Use   Smoking status: Former    Packs/day: 3.00    Years: 17.00    Total pack years: 51.00    Types: Cigarettes    Quit date: 03/30/1983    Years since quitting: 39.0    Passive exposure: Never   Smokeless tobacco: Never  Vaping Use   Vaping Use: Never used  Substance and Sexual Activity   Alcohol use: No    Alcohol/week: 0.0 standard drinks of alcohol    Comment: 05/30/2015 "recovering alcoholic; dry date 6/60/6004"   Drug use: No    Types: Marijuana, LSD, Mescaline    Comment: 03/07/2015 "stopped all drugs back in the 1980's"   Sexual activity: Not Currently    Birth control/protection: None

## 2022-04-08 NOTE — Progress Notes (Signed)
He is having good/bad days He has done 3 visits of PT which he states is helping some.  Depending on what activity he does; the pain does increase   He is taking tylenol as needed for the pain

## 2022-04-13 ENCOUNTER — Encounter: Payer: Self-pay | Admitting: Orthopedic Surgery

## 2022-04-13 ENCOUNTER — Ambulatory Visit: Payer: Medicare PPO | Admitting: Orthopedic Surgery

## 2022-04-13 VITALS — BP 169/71 | HR 71 | Ht 68.0 in | Wt 138.0 lb

## 2022-04-13 DIAGNOSIS — M5412 Radiculopathy, cervical region: Secondary | ICD-10-CM | POA: Diagnosis not present

## 2022-04-13 NOTE — Progress Notes (Signed)
Orthopedic Spine Surgery Office Note  Assessment: Patient is a 72 y.o. male with left-sided neck pain that radiates into the shoulder and lateral aspect of the deltoid.  Has C5-7 ACDF, possible C5 radiculopathy from adjacent segment disease   Plan: -Explained that initially conservative treatment is tried as a significant number of patients may experience relief with these treatment modalities. Discussed that the conservative treatments include:  -activity modification  -physical therapy  -over the counter pain medications  -medrol dosepak  -cervical steroid injections -Patient has tried PT, trigger point injections, tylenol -Since patient has gotten better with trigger point injections and physical therapy, recommended continue with the exercises.  No further intervention at this time -If his symptoms recur, would recommend an MRI of the cervical spine to evaluate for radiculopathy -Patient should can return to my office or Dr. Rolena Infante in 6 to 8 weeks time for symptom evaluation, x-rays at next visit: none   Patient expressed understanding of the plan and all questions were answered to the patient's satisfaction.   ___________________________________________________________________________   History:  Patient is a 72 y.o. male who presents today for cervical spine.  Patient has a history of a C5-7 ACDF.  Within the last 2 to 3 months he has noticed pain in the left side of his neck that radiates into his left shoulder and the lateral aspect of the deltoid.  He has been doing physical therapy, trigger point ejections and home exercises.  He feels that he is gotten significant better within the last 2 weeks.  He states that he has very minimal symptoms today.  There is no trauma or injury that brought on his pain.  Denies paresthesia numbness.   Weakness: denies Difficulty with fine motor skills (e.g., buttoning shirts, handwriting): denies Symptoms of imbalance: denies Paresthesias and  numbness: denies Bowel or bladder incontinence: denies Saddle anesthesia: denies  Treatments tried: PT, trigger point injections, tylenol  Review of systems: Denies fevers and chills, night sweats, unexplained weight loss. Has had pain that wakes him at night. History of skin cancer  Past medical history: Basal cell carcinoma Squamous cell carcinoma Osteoarthritis Gastroparesis Depression Anxiety GERD Hypothyroidism Atrial fibrillation HTN Chronic pain  Allergies: NKDA  Past surgical history:  Hernia repair C5-7 ACDF Basal cell carcinoma excision Back surgery Polypectomy Skin squamous cell carcinoma excision    Social history: Denies use of nicotine product (smoking, vaping, patches, smokeless) Alcohol use: denies Denies recreational drug use  Physical Exam:  General: no acute distress, appears stated age Neurologic: alert, answering questions appropriately, following commands Respiratory: unlabored breathing on room air, symmetric chest rise Psychiatric: appropriate affect, normal cadence to speech   MSK (spine):  -Strength exam      Left  Right Grip strength               5/5  5/5 Interosseus   5/5   5/5 Wrist extension  5/5  5/5 Wrist flexion   5/5  5/5 Elbow flexion   5/5  5/5 Deltoid    5/5  5/5  EHL    5/5  5/5 TA    5/5  5/5 GSC    5/5  5/5 Knee extension  5/5  5/5 Hip flexion   5/5  5/5  -Sensory exam    Sensation intact to light touch in L3-S1 nerve distributions of bilateral lower extremities  Sensation intact to light touch in C5-T1 nerve distributions of bilateral upper extremities  -Brachioradialis DTR: 1/4 on the left, 1/4 on the right -  Biceps DTR: 1/4 on the left, 1/4 on the right  -Spurling: negative bilaterally -Hoffman sign: negative bilaterally -Clonus: no beats bilaterally -Interosseous wasting: none seen -Grip and release test: negative -Romberg: negative -Gait: normal -Imbalance with tandem gait: no  Left shoulder  exam: no pain through range of motion Right shoulder exam: no pain through range of motion  Tinel's at wrist: negative bilaterally Durkan's: negative bilaterally  Tinel's at elbow: negative bilaterally  Imaging: XR of the cervical spine from 03/11/2022 was independently reviewed and interpreted, showing C5-7 ACDF with anterior instrumentation and allograft interbody devices.  No lucency around the screws and screws are backing out.  Has a spondylolisthesis at C7/T1 -shifts 1 mm between flexion and extension views.  Disc height loss with anterior osteophyte formation at C4-5.   Patient name: Roy Long Patient MRN: [id number] Date of visit: 04/13/22

## 2022-04-14 ENCOUNTER — Ambulatory Visit: Payer: Medicare PPO | Admitting: Physical Therapy

## 2022-04-14 ENCOUNTER — Encounter: Payer: Self-pay | Admitting: Physical Therapy

## 2022-04-14 DIAGNOSIS — M6281 Muscle weakness (generalized): Secondary | ICD-10-CM

## 2022-04-14 DIAGNOSIS — M25512 Pain in left shoulder: Secondary | ICD-10-CM | POA: Diagnosis not present

## 2022-04-14 DIAGNOSIS — M542 Cervicalgia: Secondary | ICD-10-CM | POA: Diagnosis not present

## 2022-04-14 DIAGNOSIS — R293 Abnormal posture: Secondary | ICD-10-CM

## 2022-04-14 NOTE — Therapy (Signed)
OUTPATIENT PHYSICAL THERAPY TREATMENT NOTE   Patient Name: Roy Long MRN: [id number] DOB:[birth year], 72 y.o., male Today's Date: 04/14/2022  PCP: Janith Lima, MD   REFERRING PROVIDER: Elba Barman, DO   END OF SESSION:   PT End of Session - 04/14/22 1121     Visit Number 4    Number of Visits 4    Date for PT Re-Evaluation 04/28/22    Authorization Type [employer]    PT Start Time 1017    PT Stop Time 1100    PT Time Calculation (min) 43 min    Activity Tolerance Patient tolerated treatment well    Behavior During Therapy WFL for tasks assessed/performed               Past Medical History:  Diagnosis Date   Age-related macular degeneration, dry, left eye    Alcohol dependence ([place])    Arthritis    "maybe a little bit in my left knee and right forefinger" (05/30/2015)   Atrial fibrillation ([place])    ablation 05-2015 with no reoccurance as of 09-2015   Basal cell carcinoma    Depression    hx   Family history of adverse reaction to anesthesia    "father had head injuries and dementia; everytime he was put under less of his mentation came back"   Gastroparesis    GERD (gastroesophageal reflux disease)    History of kidney stones    Hypercholesterolemia dx'd 02/2015   Hypertension    Hypothyroidism    Kidney stones    Migraine aura without headache    "very rare now" (05/30/2015)   Pneumonia ~ 2004   Squamous carcinoma    hand, chest   Past Surgical History:  Procedure Laterality Date   ANTERIOR CERVICAL DECOMP/DISCECTOMY FUSION  04/2009   ATRIAL FIBRILLATION ABLATION  05/30/2015   BACK SURGERY     BASAL CELL CARCINOMA EXCISION     chest or back or left arm   CARDIOVERSION  06/2002   CATARACT EXTRACTION W/ INTRAOCULAR LENS IMPLANT Left ~ 2012   COLONOSCOPY     COLONOSCOPY W/ POLYPECTOMY  02/2003   CYSTOSCOPY W/ STONE MANIPULATION  1980's   "basketted it out; no stent"   ELECTROPHYSIOLOGIC STUDY N/A 05/30/2015   Procedure: Atrial Fibrillation Ablation;   Surgeon: Will Meredith Leeds, MD;  Location: [facility];  Service: Cardiovascular;  Laterality: N/A;   EXTRACORPOREAL SHOCK WAVE LITHOTRIPSY Left 04/12/2016   Procedure: LEFT EXTRACORPOREAL SHOCK WAVE LITHOTRIPSY (ESWL);  Surgeon: Irine Seal, MD;  Location: [facility];  Service: Urology;  Laterality: Left;   INGUINAL HERNIA REPAIR Bilateral 2004   INGUINAL HERNIA REPAIR Bilateral 2004   MOHS SURGERY Right    "temple"   SQUAMOUS CELL CARCINOMA EXCISION     chest or back or left arm   Patient Active Problem List   Diagnosis Date Noted   Moderate episode of recurrent major depressive disorder ([place]) 01/19/2022   Hearing loss due to cerumen impaction, bilateral 07/13/2021   Panlobular emphysema ([place]) 01/07/2021   Neuropathy involving both lower extremities 04/18/2020   Seasonal allergic rhinitis due to pollen 10/24/2018   Essential hypertension 04/25/2018   Laryngopharyngeal reflux (LPR) 02/07/2018   Kidney stone on left side 03/30/2016   IBS (irritable bowel syndrome) 06/27/2015   Coronary artery disease involving native coronary artery of native heart without angina pectoris 06/26/2015   OAB (overactive bladder) 04/16/2015   Atrial flutter ([place])    Hyperlipidemia with target LDL less than  70 01/18/2014   BPH (benign prostatic hyperplasia) 04/23/2011   Hypothyroidism 04/15/2011   Routine general medical examination at a health care facility 04/15/2011   Atrial fibrillation (HCC)     REFERRING DIAG:  M54.2 (ICD-10-CM) - Neck pain  S46.812A (ICD-10-CM) - Trapezius strain, left, initial encounter  M89.9 (ICD-10-CM) - Scapular dysfunction    THERAPY DIAG:  Acute pain of left shoulder  Cervicalgia  Muscle weakness (generalized)  Abnormal posture  Rationale for Evaluation and Treatment Rehabilitation  PERTINENT HISTORY: cervical 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   PRECAUTIONS: prior cervical fusion  SUBJECTIVE:                                                                                                                                                                                       SUBJECTIVE STATEMENT:   Pt states last week after PT was the best week he has had in a long time with his pain. He then on Monday did some housework and washed the dog, has some pain in his left upper trap/scapular region now   PAIN:  Are you having pain? Yes, 1/10 at rest.    OBJECTIVE: (objective measures completed at initial evaluation unless otherwise dated)   DIAGNOSTIC FINDINGS:  "X-rays demonstrate a prior cervical fusion and from C5-C7. There is grade 1 anterior listhesis of C7 on T1. There is anterior spurring of C4 and notable facet sclerosis from C3-C5. No acute fracture noted. "   PATIENT SURVEYS:  FOTO 62% functional intake at eval   COGNITION: Overall cognitive status: Within functional limits for tasks assessed   SENSATION: WFL   POSTURE: rounded shoulders, has tendency to hike his shoulders   PALPATION: Trigger points and tightness in upper trap             CERVICAL ROM:    Active ROM A/PROM (deg) eval  Flexion 20  Extension 40  Right lateral flexion 30  Left lateral flexion 20  Right rotation 50  Left rotation 60   (Blank rows = not tested)   UPPER EXTREMITY STRENGTH:   MMT Right eval Left eval Bilat 04/07/22   Shoulder flexion '4 4 5  '$ Shoulder extension       Shoulder abduction '4 4 5  '$ Shoulder adduction       Shoulder extension       Shoulder internal rotation '4 4 5  '$ Shoulder external rotation '4 4 5  '$ Elbow flexion       Elbow extension       Wrist flexion       Wrist extension       Wrist  ulnar deviation       Wrist radial deviation       Wrist pronation       Wrist supination        (Blank rows = not tested)   UPPER EXTREMITY ROM   AROM Right eval Left eval  Shoulder flexion WNL WNl  Shoulder extension      Shoulder abduction WNL WNl  Shoulder adduction      Shoulder extension       Shoulder internal rotation WNL WNL  Shoulder external rotation WNL WNL  Middle trapezius      Lower trapezius      Elbow flexion      Elbow extension      Wrist flexion      Wrist extension      Wrist ulnar deviation      Wrist radial deviation      Wrist pronation      Wrist supination      Grip strength       (Blank rows = not tested)   CERVICAL SPECIAL TESTS:  Spurling's test: Negative   FUNCTIONAL TESTS:        TODAY'S TREATMENT:  04/14/22 -Row machine #20 2x10 -Lat pull down machine #20 2x10, first set wide grip, second set close grip -Chest press machine #15 2x10 -Shoulder ext with cable machine #5 10x each arm  -Shoulder ER with cable machine 5# 10x each arm  -Sci fit UBE level 4 3 min forward/backward  Manual: Ischemic compression and pin and stretch to left upper trap Skilled palpation and Trigger Point Dry-Needling  Treatment instructions: Expect mild to moderate muscle soreness. Patient Consent Given: Yes Education handout provided: verbally provided Muscles treated:left upper trap, mid trap, infraspinatus Treatment response/outcome: good overall tolerance,twitch response noted  04/07/22:   Manual: Ischemic compression and pin and stretch to left upper trap Skilled palpation and Trigger Point Dry-Needling  Treatment instructions: Expect mild to moderate muscle soreness. Patient Consent Given: Yes Education handout provided: verbally provided Muscles treated:left upper trap, mid trap, infraspinatus Treatment response/outcome: good overall tolerance,twitch response noted   Self care: recommendation of theracane with education provided on how to use and where to attain one. Recommendation to add push ups plus exercise for serratus anterior strengthening to decrease scapular winging.     PATIENT EDUCATION: Education details: HEP, PT plan of care, theracane Person educated: Patient Education method: Explanation, Demonstration, Verbal cues, and  Handouts Education comprehension: verbalized understanding and needs further education     HOME EXERCISE PROGRAM: Access Code: [email or web address] URL: [email or web address] Date: 03/17/2022 Prepared by: Elsie Ra   Exercises - Seated Upper Trap Stretch (Mirrored)  - 2-3 x daily - 6 x weekly - 1 sets - 3 reps - 20 sec hold - Seated Levator Scapulae Stretch  - 2-3 x daily - 6 x weekly - 3 reps - 20 sec hold - Standing Scapular Retraction  - 2-3 x daily - 6 x weekly - 1 sets - 10 reps - 5 sec hold - Standing Single Arm Shoulder Abduction with Resistance  - 1 x daily - 6 x weekly - 3 sets - 10 reps - Standing Shoulder Flexion with Resistance  - 1 x daily - 6 x weekly - 3 sets - 10 reps - Standing Shoulder External Rotation with Resistance  - 1 x daily - 6 x weekly - 1-3 sets - 10 reps - Standing Shoulder Horizontal Abduction with Resistance  - 1 x daily - 6 x weekly -  3 sets - 10 reps -Push ups plus 10 sets   ASSESSMENT:   CLINICAL IMPRESSION He appears to be improving with PT but still with some pain/discomfort in left upper trap/scapular region. He has not yet been back to the gym and is fearful about this. We did try some light gym machines today for shoulder/upper back strengthening with good overall tolerance to this and performed DN and trigger point release afterwards. PT recommending to continue current PT plan and we will do recert next visit.   OBJECTIVE IMPAIRMENTS: decreased activity tolerance, decreased shoulder mobility, decreased ROM, decreased strength, impaired flexibility, impaired UE use, postural dysfunction, and pain.   ACTIVITY LIMITATIONS: reaching, lifting, carry,  cleaning, driving, and or occupation   PERSONAL FACTORS: cervical spondylosis with a prior fusion at the C5-C7 level.  also affecting patient's functional outcome.   REHAB POTENTIAL: Good   CLINICAL DECISION MAKING: Stable/uncomplicated   EVALUATION COMPLEXITY: Low       GOALS: Short term  PT Goals Target date: 04/14/2022     Pt will be I and compliant with HEP. Baseline:  Goal status: New Pt will decrease pain by 25% overall Baseline: Goal status: New   Long term PT goals Target date:04/28/2022     Pt will improve neck AROM to [facility] to improve functional scanning Baseline: Goal status: New Pt will improve  Rt shoulder strength to at least 4+/5 MMT to improve functional strength Baseline: Goal status: New Pt will improve FOTO to at least 70% functional to show improved function Baseline: Goal status: New Pt will reduce pain to overall less than 3/10 with usual activity and gym activity Baseline: Goal status: New   PLAN: PT FREQUENCY: 1 times per week    PT DURATION: 6 weeks   PLANNED INTERVENTIONS (unless contraindicated): aquatic PT, Canalith repositioning, cryotherapy, Electrical stimulation, Iontophoresis with 4 mg/ml dexamethasome, Moist heat, traction, Ultrasound, gait training, Therapeutic exercise, balance training, neuromuscular re-education, patient/family education, prosthetic training, manual techniques, passive ROM, dry needling, taping, vasopnuematic device, vestibular, spinal manipulations, joint manipulations   PLAN FOR NEXT SESSION: recert, look for humana Wyatt Portela, PT, DPT 04/14/2022, 11:22 AM

## 2022-04-15 ENCOUNTER — Other Ambulatory Visit: Payer: Self-pay | Admitting: Internal Medicine

## 2022-04-15 DIAGNOSIS — I10 Essential (primary) hypertension: Secondary | ICD-10-CM

## 2022-04-15 DIAGNOSIS — I2510 Atherosclerotic heart disease of native coronary artery without angina pectoris: Secondary | ICD-10-CM

## 2022-04-21 ENCOUNTER — Telehealth: Payer: Medicare PPO | Admitting: Nurse Practitioner

## 2022-04-21 ENCOUNTER — Encounter: Payer: Medicare PPO | Admitting: Physical Therapy

## 2022-04-21 DIAGNOSIS — U071 COVID-19: Secondary | ICD-10-CM | POA: Diagnosis not present

## 2022-04-21 MED ORDER — MOLNUPIRAVIR EUA 200MG CAPSULE: 4.0000 | ORAL_CAPSULE | Freq: Two times a day (BID) | ORAL | 0 refills | Status: AC

## 2022-04-21 NOTE — Progress Notes (Signed)
Virtual Visit Consent   Roy Long, you are scheduled for a virtual visit with Mary-Margaret Hassell Done, [place], a [facility] provider, today.     Just as with appointments in the office, your consent must be obtained to participate.  Your consent will be active for this visit and any virtual visit you may have with one of our providers in the next 365 days.     If you have a MyChart account, a copy of this consent can be sent to you electronically.  All virtual visits are billed to your insurance company just like a traditional visit in the office.    As this is a virtual visit, video technology does not allow for your provider to perform a traditional examination.  This may limit your provider's ability to fully assess your condition.  If your provider identifies any concerns that need to be evaluated in person or the need to arrange testing (such as labs, EKG, etc.), we will make arrangements to do so.     Although advances in technology are sophisticated, we cannot ensure that it will always work on either your end or our end.  If the connection with a video visit is poor, the visit may have to be switched to a telephone visit.  With either a video or telephone visit, we are not always able to ensure that we have a secure connection.     I need to obtain your verbal consent now.   Are you willing to proceed with your visit today? YES   Roy Long has provided verbal consent on 04/21/2022 for a virtual visit (video or telephone).   Mary-Margaret Hassell Done, FNP   Date: 04/21/2022 10:16 AM   Virtual Visit via Video Note   I, Mary-Margaret Hassell Done, connected with Roy Long ([id number], 06-29-1950) on 04/21/22 at 10:45 AM EST by a video-enabled telemedicine application and verified that I am speaking with the correct person using two identifiers.  Location: Patient: Virtual Visit Location Patient: Home Provider: Virtual Visit Location Provider: Mobile   I discussed the limitations of  evaluation and management by telemedicine and the availability of in person appointments. The patient expressed understanding and agreed to proceed.    History of Present Illness: Roy Long is a 72 y.o. who identifies as a male who was assigned male at birth, and is being seen today for covid positive.  HPI: URI  This is a new problem. The current episode started yesterday. The problem has been waxing and waning. The maximum temperature recorded prior to his arrival was 101 - 101.9 F. The fever has been present for 1 to 2 days. Associated symptoms include congestion, rhinorrhea and a sore throat. He has tried acetaminophen for the symptoms. The treatment provided mild relief.  Tested positive for covid this morning   Review of Systems  HENT:  Positive for congestion, rhinorrhea and sore throat.     Problems:  Patient Active Problem List   Diagnosis Date Noted   Moderate episode of recurrent major depressive disorder ([place]) 01/19/2022   Hearing loss due to cerumen impaction, bilateral 07/13/2021   Panlobular emphysema ([place]) 01/07/2021   Neuropathy involving both lower extremities 04/18/2020   Seasonal allergic rhinitis due to pollen 10/24/2018   Essential hypertension 04/25/2018   Laryngopharyngeal reflux (LPR) 02/07/2018   Kidney stone on left side 03/30/2016   IBS (irritable bowel syndrome) 06/27/2015   Coronary artery disease involving native coronary artery of native heart without angina pectoris 06/26/2015  OAB (overactive bladder) 04/16/2015   Atrial flutter ([place])    Hyperlipidemia with target LDL less than 70 01/18/2014   BPH (benign prostatic hyperplasia) 04/23/2011   Hypothyroidism 04/15/2011   Routine general medical examination at a health care facility 04/15/2011   Atrial fibrillation (HCC)     Allergies:  Allergies  Allergen Reactions   Pneumococcal Vaccines Swelling    Shoulder to elbow swelling   Tape Other (See Comments)    Please use "paper" tape    Amlodipine Swelling    constipation   Medications:  Current Outpatient Medications:    acyclovir (ZOVIRAX) 400 MG tablet, Take 1 tablet (400 mg total) by mouth daily., Disp: 90 tablet, Rfl: 1   atorvastatin (LIPITOR) 80 MG tablet, TAKE 1 TABLET BY MOUTH EVERY DAY, Disp: 90 tablet, Rfl: 3   cholecalciferol (VITAMIN D) 1000 UNITS tablet, Take 1,000 Units by mouth daily., Disp: , Rfl:    cycloSPORINE (RESTASIS) 0.05 % ophthalmic emulsion, Place 1 drop into both eyes 2 (two) times daily., Disp: , Rfl:    diltiazem (CARDIZEM CD) 240 MG 24 hr capsule, TAKE 1 CAPSULE BY MOUTH EVERY DAY, Disp: 90 capsule, Rfl: 3   ELIQUIS 5 MG TABS tablet, TAKE 1 TABLET BY MOUTH TWICE A DAY, Disp: 180 tablet, Rfl: 1   finasteride (PROSCAR) 5 MG tablet, TAKE 1 TABLET (5 MG TOTAL) BY MOUTH DAILY., Disp: 90 tablet, Rfl: 0   Fluorouracil 5 % SOLN, Apply topically., Disp: , Rfl:    hydrALAZINE (APRESOLINE) 50 MG tablet, TAKE 1 TABLET BY MOUTH THREE TIMES A DAY, Disp: 270 tablet, Rfl: 1   levothyroxine (SYNTHROID) 25 MCG tablet, TAKE 1 TABLET BY MOUTH EVERY DAY BEFORE BREAKFAST, Disp: 90 tablet, Rfl: 0   mirtazapine (REMERON) 7.5 MG tablet, Take 1 tablet (7.5 mg total) by mouth at bedtime., Disp: 90 tablet, Rfl: 0   montelukast (SINGULAIR) 10 MG tablet, TAKE 1 TABLET BY MOUTH EVERYDAY AT BEDTIME, Disp: 90 tablet, Rfl: 1   Multiple Vitamins-Minerals (PRESERVISION AREDS PO), Take 1 tablet by mouth daily., Disp: , Rfl:    omeprazole (PRILOSEC) 40 MG capsule, TAKE 1 CAPSULE (40 MG TOTAL) BY MOUTH DAILY., Disp: 90 capsule, Rfl: 0   tamsulosin (FLOMAX) 0.4 MG CAPS capsule, TAKE 1 CAPSULE BY MOUTH EVERY DAY, Disp: 90 capsule, Rfl: 1   Tiotropium Bromide Monohydrate (SPIRIVA RESPIMAT) 2.5 MCG/ACT AERS, INHALE 2 PUFFS BY MOUTH INTO THE LUNGS DAILY, Disp: 12 g, Rfl: 1  Observations/Objective: Patient is well-developed, well-nourished in no acute distress.  Resting comfortably  at home.  Head is normocephalic, atraumatic.  No  labored breathing.  Speech is clear and coherent with logical content.  Patient is alert and oriented at baseline.  Raspy voice No cough  Assessment and Plan:  Roy Long in today with chief complaint of Covid Positive   1. Positive self-administered antigen test for COVID-19 1. Take meds as prescribed 2. Use a cool mist humidifier especially during the winter months and when heat has been humid. 3. Use saline nose sprays frequently 4. Saline irrigations of the nose can be very helpful if done frequently.  * 4X daily for 1 week*  * Use of a nettie pot can be helpful with this. Follow directions with this* 5. Drink plenty of fluids 6. Keep thermostat turn down low 7.For any cough or congestion- mucinex or delsym if develop a cough 8. For fever or aces or pains- take tylenol or ibuprofen appropriate for age and weight.  * for  fevers greater than 101 orally you may alternate ibuprofen and tylenol every  3 hours.   Meds ordered this encounter  Medications   molnupiravir EUA (LAGEVRIO) 200 mg CAPS capsule    Sig: Take 4 capsules (800 mg total) by mouth 2 (two) times daily for 5 days.    Dispense:  40 capsule    Refill:  0    Order Specific Question:   Supervising Provider    Answer:   Chase Picket [id number]      Follow Up Instructions: I discussed the assessment and treatment plan with the patient. The patient was provided an opportunity to ask questions and all were answered. The patient agreed with the plan and demonstrated an understanding of the instructions.  A copy of instructions were sent to the patient via MyChart.  The patient was advised to call back or seek an in-person evaluation if the symptoms worsen or if the condition fails to improve as anticipated.  Time:  I spent 10 minutes with the patient via telehealth technology discussing the above problems/concerns.    Mary-Margaret Hassell Done, FNP

## 2022-04-21 NOTE — Patient Instructions (Signed)
Yetta Flock, thank you for joining Chevis Pretty, FNP for today's virtual visit.  While this provider is not your primary care provider (PCP), if your PCP is located in our provider database this encounter information will be shared with them immediately following your visit.   [facility] account gives you access to today's visit and all your visits, tests, and labs performed at [facility] " click here if you don't have a [facility] account or go to [email or web address]  Consent: (Patient) Roy Long provided verbal consent for this virtual visit at the beginning of the encounter.  Current Medications:  Current Outpatient Medications:    molnupiravir EUA (LAGEVRIO) 200 mg CAPS capsule, Take 4 capsules (800 mg total) by mouth 2 (two) times daily for 5 days., Disp: 40 capsule, Rfl: 0   acyclovir (ZOVIRAX) 400 MG tablet, Take 1 tablet (400 mg total) by mouth daily., Disp: 90 tablet, Rfl: 1   atorvastatin (LIPITOR) 80 MG tablet, TAKE 1 TABLET BY MOUTH EVERY DAY, Disp: 90 tablet, Rfl: 3   cholecalciferol (VITAMIN D) 1000 UNITS tablet, Take 1,000 Units by mouth daily., Disp: , Rfl:    cycloSPORINE (RESTASIS) 0.05 % ophthalmic emulsion, Place 1 drop into both eyes 2 (two) times daily., Disp: , Rfl:    diltiazem (CARDIZEM CD) 240 MG 24 hr capsule, TAKE 1 CAPSULE BY MOUTH EVERY DAY, Disp: 90 capsule, Rfl: 3   ELIQUIS 5 MG TABS tablet, TAKE 1 TABLET BY MOUTH TWICE A DAY, Disp: 180 tablet, Rfl: 1   finasteride (PROSCAR) 5 MG tablet, TAKE 1 TABLET (5 MG TOTAL) BY MOUTH DAILY., Disp: 90 tablet, Rfl: 0   Fluorouracil 5 % SOLN, Apply topically., Disp: , Rfl:    hydrALAZINE (APRESOLINE) 50 MG tablet, TAKE 1 TABLET BY MOUTH THREE TIMES A DAY, Disp: 270 tablet, Rfl: 1   levothyroxine (SYNTHROID) 25 MCG tablet, TAKE 1 TABLET BY MOUTH EVERY DAY BEFORE BREAKFAST, Disp: 90 tablet, Rfl: 0   mirtazapine (REMERON) 7.5 MG tablet, Take 1 tablet (7.5 mg total) by mouth  at bedtime., Disp: 90 tablet, Rfl: 0   montelukast (SINGULAIR) 10 MG tablet, TAKE 1 TABLET BY MOUTH EVERYDAY AT BEDTIME, Disp: 90 tablet, Rfl: 1   Multiple Vitamins-Minerals (PRESERVISION AREDS PO), Take 1 tablet by mouth daily., Disp: , Rfl:    omeprazole (PRILOSEC) 40 MG capsule, TAKE 1 CAPSULE (40 MG TOTAL) BY MOUTH DAILY., Disp: 90 capsule, Rfl: 0   tamsulosin (FLOMAX) 0.4 MG CAPS capsule, TAKE 1 CAPSULE BY MOUTH EVERY DAY, Disp: 90 capsule, Rfl: 1   Tiotropium Bromide Monohydrate (SPIRIVA RESPIMAT) 2.5 MCG/ACT AERS, INHALE 2 PUFFS BY MOUTH INTO THE LUNGS DAILY, Disp: 12 g, Rfl: 1   Medications ordered in this encounter:  Meds ordered this encounter  Medications   molnupiravir EUA (LAGEVRIO) 200 mg CAPS capsule    Sig: Take 4 capsules (800 mg total) by mouth 2 (two) times daily for 5 days.    Dispense:  40 capsule    Refill:  0    Order Specific Question:   Supervising Provider    Answer:   Chase Picket [id number]     *If you need refills on other medications prior to your next appointment, please contact your pharmacy*  Follow-Up: Call back or seek an in-person evaluation if the symptoms worsen or if the condition fails to improve as anticipated.  [place] [phone]  Other Instructions 1. Take meds as prescribed 2. Use a cool  mist humidifier especially during the winter months and when heat has been humid. 3. Use saline nose sprays frequently 4. Saline irrigations of the nose can be very helpful if done frequently.  * 4X daily for 1 week*  * Use of a nettie pot can be helpful with this. Follow directions with this* 5. Drink plenty of fluids 6. Keep thermostat turn down low 7.For any cough or congestion- mucinex or delsym if develop a cough 8. For fever or aces or pains- take tylenol or ibuprofen appropriate for age and weight.  * for fevers greater than 101 orally you may alternate ibuprofen and tylenol every  3 hours.      If you have been  instructed to have an in-person evaluation today at a local Urgent Care facility, please use the link below. It will take you to a list of all of our available [facility], including address, phone number and hours of operation. Please do not delay care.  [facility]  If you or a family member do not have a primary care provider, use the link below to schedule a visit and establish care. When you choose a [facility] primary care physician or advanced practice provider, you gain a long-term partner in health. Find a Primary Care Provider  Learn more about [facility]'s in-office and virtual care options: [facility]

## 2022-04-26 ENCOUNTER — Other Ambulatory Visit: Payer: Self-pay | Admitting: Internal Medicine

## 2022-04-26 DIAGNOSIS — A6000 Herpesviral infection of urogenital system, unspecified: Secondary | ICD-10-CM

## 2022-04-28 ENCOUNTER — Encounter: Payer: Medicare PPO | Admitting: Physical Therapy

## 2022-05-05 ENCOUNTER — Encounter: Payer: Medicare PPO | Admitting: Physical Therapy

## 2022-05-11 ENCOUNTER — Other Ambulatory Visit: Payer: Self-pay | Admitting: Internal Medicine

## 2022-05-11 DIAGNOSIS — F331 Major depressive disorder, recurrent, moderate: Secondary | ICD-10-CM

## 2022-05-12 ENCOUNTER — Encounter: Payer: Self-pay | Admitting: Physical Therapy

## 2022-05-12 ENCOUNTER — Ambulatory Visit: Payer: Medicare PPO | Admitting: Physical Therapy

## 2022-05-12 DIAGNOSIS — M6281 Muscle weakness (generalized): Secondary | ICD-10-CM | POA: Diagnosis not present

## 2022-05-12 DIAGNOSIS — M542 Cervicalgia: Secondary | ICD-10-CM

## 2022-05-12 DIAGNOSIS — M25512 Pain in left shoulder: Secondary | ICD-10-CM

## 2022-05-12 DIAGNOSIS — R293 Abnormal posture: Secondary | ICD-10-CM

## 2022-05-12 NOTE — Therapy (Signed)
OUTPATIENT PHYSICAL THERAPY TREATMENT NOTE/RECERT   Patient Name: Roy Long MRN: [id number] DOB:August 31, 1950, 72 y.o., male Today's Date: 05/12/2022  PCP: Janith Lima, MD   REFERRING PROVIDER: Elba Barman, DO   END OF SESSION:   PT End of Session - 05/12/22 1305     Visit Number 5    Number of Visits 12    Date for PT Re-Evaluation 06/18/22    Authorization Type [employer]    PT Start Time 1145    PT Stop Time 1233    PT Time Calculation (min) 48 min    Activity Tolerance Patient tolerated treatment well    Behavior During Therapy WFL for tasks assessed/performed                Past Medical History:  Diagnosis Date   Age-related macular degeneration, dry, left eye    Alcohol dependence ([place])    Arthritis    "maybe a little bit in my left knee and right forefinger" (05/30/2015)   Atrial fibrillation ([place])    ablation 05-2015 with no reoccurance as of 09-2015   Basal cell carcinoma    Depression    hx   Family history of adverse reaction to anesthesia    "father had head injuries and dementia; everytime he was put under less of his mentation came back"   Gastroparesis    GERD (gastroesophageal reflux disease)    History of kidney stones    Hypercholesterolemia dx'd 02/2015   Hypertension    Hypothyroidism    Kidney stones    Migraine aura without headache    "very rare now" (05/30/2015)   Pneumonia ~ 2004   Squamous carcinoma    hand, chest   Past Surgical History:  Procedure Laterality Date   ANTERIOR CERVICAL DECOMP/DISCECTOMY FUSION  04/2009   ATRIAL FIBRILLATION ABLATION  05/30/2015   BACK SURGERY     BASAL CELL CARCINOMA EXCISION     chest or back or left arm   CARDIOVERSION  06/2002   CATARACT EXTRACTION W/ INTRAOCULAR LENS IMPLANT Left ~ 2012   COLONOSCOPY     COLONOSCOPY W/ POLYPECTOMY  02/2003   CYSTOSCOPY W/ STONE MANIPULATION  1980's   "basketted it out; no stent"   ELECTROPHYSIOLOGIC STUDY N/A 05/30/2015   Procedure: Atrial Fibrillation  Ablation;  Surgeon: Will Meredith Leeds, MD;  Location: [facility];  Service: Cardiovascular;  Laterality: N/A;   EXTRACORPOREAL SHOCK WAVE LITHOTRIPSY Left 04/12/2016   Procedure: LEFT EXTRACORPOREAL SHOCK WAVE LITHOTRIPSY (ESWL);  Surgeon: Irine Seal, MD;  Location: [facility];  Service: Urology;  Laterality: Left;   INGUINAL HERNIA REPAIR Bilateral 2004   INGUINAL HERNIA REPAIR Bilateral 2004   MOHS SURGERY Right    "temple"   SQUAMOUS CELL CARCINOMA EXCISION     chest or back or left arm   Patient Active Problem List   Diagnosis Date Noted   Moderate episode of recurrent major depressive disorder ([place]) 01/19/2022   Hearing loss due to cerumen impaction, bilateral 07/13/2021   Panlobular emphysema ([place]) 01/07/2021   Neuropathy involving both lower extremities 04/18/2020   Seasonal allergic rhinitis due to pollen 10/24/2018   Essential hypertension 04/25/2018   Laryngopharyngeal reflux (LPR) 02/07/2018   Kidney stone on left side 03/30/2016   IBS (irritable bowel syndrome) 06/27/2015   Coronary artery disease involving native coronary artery of native heart without angina pectoris 06/26/2015   OAB (overactive bladder) 04/16/2015   Atrial flutter ([place])    Hyperlipidemia with target LDL less  than 70 01/18/2014   BPH (benign prostatic hyperplasia) 04/23/2011   Hypothyroidism 04/15/2011   Routine general medical examination at a health care facility 04/15/2011   Atrial fibrillation (H[facility])     REFERRING DIAG:  M54.2 (ICD-10-CM) - Neck pain  S46.812A (ICD-10-CM) - Trapezius strain, left, initial encounter  M89.9 (ICD-10-CM) - Scapular dysfunction    THERAPY DIAG:  Acute pain of left shoulder  Cervicalgia  Muscle weakness (generalized)  Abnormal posture  Rationale for Evaluation and Treatment Rehabilitation  PERTINENT HISTORY: cervical 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   PRECAUTIONS: prior cervical fusion  SUBJECTIVE:                                                                                                                                                                                       SUBJECTIVE STATEMENT:   Pt states he had COVID so has not been able to attend PT or do the strength exercises due to fatigue. He has kept up with the stretches however.   PAIN:  Are you having pain? Yes, varries, but overall not having the deep neck/shoulder pain he was having.    OBJECTIVE: (objective measures completed at initial evaluation unless otherwise dated)   DIAGNOSTIC FINDINGS:  "X-rays demonstrate a prior cervical fusion and from C5-C7. There is grade 1 anterior listhesis of C7 on T1. There is anterior spurring of C4 and notable facet sclerosis from C3-C5. No acute fracture noted. "   PATIENT SURVEYS:  FOTO 62% functional intake at eval   COGNITION: Overall cognitive status: Within functional limits for tasks assessed   SENSATION: WFL   POSTURE: rounded shoulders, has tendency to hike his shoulders   PALPATION: Trigger points and tightness in upper trap             CERVICAL ROM:    Active ROM A/PROM (deg) eval AROM 05/12/22  Flexion 20 35  Extension 40 50  Right lateral flexion 30 33  Left lateral flexion 20 27  Right rotation 50 60  Left rotation 60 65   (Blank rows = not tested)   UPPER EXTREMITY STRENGTH:   MMT Right eval Left eval Bilat 04/07/22  Bilat 05/12/22  Shoulder flexion 4 4 5 $ 4+  Shoulder extension        Shoulder abduction 4 4 5 5  $ Shoulder adduction        Shoulder extension        Shoulder internal rotation 4 4 5 5  $ Shoulder external rotation 4 4 5 5  $ Elbow flexion        Elbow extension  Wrist flexion        Wrist extension        Wrist ulnar deviation        Wrist radial deviation        Wrist pronation        Wrist supination         (Blank rows = not tested)   UPPER EXTREMITY ROM   AROM Right eval Left eval  Shoulder flexion WNL WNl  Shoulder extension       Shoulder abduction WNL WNl  Shoulder adduction      Shoulder extension      Shoulder internal rotation WNL WNL  Shoulder external rotation WNL WNL  Middle trapezius      Lower trapezius      Elbow flexion      Elbow extension      Wrist flexion      Wrist extension      Wrist ulnar deviation      Wrist radial deviation      Wrist pronation      Wrist supination      Grip strength       (Blank rows = not tested)   CERVICAL SPECIAL TESTS:  Spurling's test: Negative   FUNCTIONAL TESTS:        TODAY'S TREATMENT:  05/12/22 -Row machine #20 2x10 -Lat pull down machine #20 2x10, first set wide grip, second set close grip -Chest press machine #15 2x10 -Shoulder ext with cable machine #5 10x each arm  -Shoulder ER with cable machine 5# 10x each arm  -Sci fit UBE level 3 2.5/2.5 min forward/backward  Manual: Ischemic compression and pin and stretch to left upper trap Skilled palpation and Trigger Point Dry-Needling  Treatment instructions: Expect mild to moderate muscle soreness. Patient Consent Given: Yes Education handout provided: verbally provided Muscles treated:left upper trap,cervical P.S, multifidi Treatment response/outcome: good overall tolerance,twitch response noted      PATIENT EDUCATION: Education details: HEP, PT plan of care, theracane Person educated: Patient Education method: Explanation, Demonstration, Verbal cues, and Handouts Education comprehension: verbalized understanding and needs further education     HOME EXERCISE PROGRAM: Access Code: [email or web address] URL: [email or web address] Date: 03/17/2022 Prepared by: Elsie Ra   Exercises - Seated Upper Trap Stretch (Mirrored)  - 2-3 x daily - 6 x weekly - 1 sets - 3 reps - 20 sec hold - Seated Levator Scapulae Stretch  - 2-3 x daily - 6 x weekly - 3 reps - 20 sec hold - Standing Scapular Retraction  - 2-3 x daily - 6 x weekly - 1 sets - 10 reps - 5 sec hold - Standing Single Arm  Shoulder Abduction with Resistance  - 1 x daily - 6 x weekly - 3 sets - 10 reps - Standing Shoulder Flexion with Resistance  - 1 x daily - 6 x weekly - 3 sets - 10 reps - Standing Shoulder External Rotation with Resistance  - 1 x daily - 6 x weekly - 1-3 sets - 10 reps - Standing Shoulder Horizontal Abduction with Resistance  - 1 x daily - 6 x weekly - 3 sets - 10 reps -Push ups plus 10 sets   ASSESSMENT:   CLINICAL IMPRESSION Recert was performed today as his PT plan of care was up. He has made significant progress with PT but had set back due to COVID so he has not yet met all of his PT goals. PT recommending up to 5 more weeks of PT to  address his remaining deficits.    OBJECTIVE IMPAIRMENTS: decreased activity tolerance, decreased shoulder mobility, decreased ROM, decreased strength, impaired flexibility, impaired UE use, postural dysfunction, and pain.   ACTIVITY LIMITATIONS: reaching, lifting, carry,  cleaning, driving, and or occupation   PERSONAL FACTORS: cervical spondylosis with a prior fusion at the C5-C7 level.  also affecting patient's functional outcome.   REHAB POTENTIAL: Good   CLINICAL DECISION MAKING: Stable/uncomplicated   EVALUATION COMPLEXITY: Low       GOALS: Short term PT Goals Target date: 04/14/2022     Pt will be I and compliant with HEP. Baseline:  Goal status: partially MET, it was met but then he was unable to do them all due to COVID 05/12/22 Pt will decrease pain by 25% overall Baseline: Goal status: MET 05/12/22   Long term PT goals Target date:06/18/2022     Pt will improve neck AROM to [facility] to improve functional scanning Baseline: Goal status: ongoing, see updated measurmemts 05/12/22 Pt will improve  Rt shoulder strength to at least 4+/5 MMT to improve functional strength Baseline: Goal status: MET 05/12/22 Pt will improve FOTO to at least 70% functional to show improved function Baseline: Goal status: ongoing Pt will reduce pain to overall  less than 3/10 with usual activity and gym activity Baseline: Goal status: ongoing.   PLAN: PT FREQUENCY: 1 times per week    PT DURATION: 6 weeks   PLANNED INTERVENTIONS (unless contraindicated): aquatic PT, Canalith repositioning, cryotherapy, Electrical stimulation, Iontophoresis with 4 mg/ml dexamethasome, Moist heat, traction, Ultrasound, gait training, Therapeutic exercise, balance training, neuromuscular re-education, patient/family education, prosthetic training, manual techniques, passive ROM, dry needling, taping, vasopnuematic device, vestibular, spinal manipulations, joint manipulations   PLAN FOR NEXT SESSION: DN if desired, left shoulder/neck mobility and shoulder/UE strength as tolerated.        Debbe Odea, PT, DPT 05/12/2022, 1:07 PM

## 2022-05-14 ENCOUNTER — Ambulatory Visit: Payer: Medicare PPO | Admitting: Orthopedic Surgery

## 2022-05-19 ENCOUNTER — Ambulatory Visit: Payer: Medicare PPO | Admitting: Physical Therapy

## 2022-05-19 DIAGNOSIS — M25512 Pain in left shoulder: Secondary | ICD-10-CM

## 2022-05-19 DIAGNOSIS — M6281 Muscle weakness (generalized): Secondary | ICD-10-CM | POA: Diagnosis not present

## 2022-05-19 DIAGNOSIS — M542 Cervicalgia: Secondary | ICD-10-CM

## 2022-05-19 DIAGNOSIS — R293 Abnormal posture: Secondary | ICD-10-CM | POA: Diagnosis not present

## 2022-05-19 NOTE — Therapy (Signed)
OUTPATIENT PHYSICAL THERAPY TREATMENT NOTE   Patient Name: Roy Long MRN: [id number] DOB:Dec 14, 1950, 72 y.o., male Today's Date: 05/19/2022  PCP: Janith Lima, MD   REFERRING PROVIDER: Elba Barman, DO   END OF SESSION:   PT End of Session - 05/19/22 1012     Visit Number 6    Number of Visits 12    Date for PT Re-Evaluation 06/18/22    Authorization Type [employer]    Authorization Time Period 12 visits until 3/22    Authorization - Visit Number 6    Authorization - Number of Visits 12    PT Start Time [id number]    PT Stop Time 1100    PT Time Calculation (min) 45 min    Activity Tolerance Patient tolerated treatment well    Behavior During Therapy WFL for tasks assessed/performed                Past Medical History:  Diagnosis Date   Age-related macular degeneration, dry, left eye    Alcohol dependence ([place])    Arthritis    "maybe a little bit in my left knee and right forefinger" (05/30/2015)   Atrial fibrillation ([place])    ablation 05-2015 with no reoccurance as of 09-2015   Basal cell carcinoma    Depression    hx   Family history of adverse reaction to anesthesia    "father had head injuries and dementia; everytime he was put under less of his mentation came back"   Gastroparesis    GERD (gastroesophageal reflux disease)    History of kidney stones    Hypercholesterolemia dx'd 02/2015   Hypertension    Hypothyroidism    Kidney stones    Migraine aura without headache    "very rare now" (05/30/2015)   Pneumonia ~ 2004   Squamous carcinoma    hand, chest   Past Surgical History:  Procedure Laterality Date   ANTERIOR CERVICAL DECOMP/DISCECTOMY FUSION  04/2009   ATRIAL FIBRILLATION ABLATION  05/30/2015   BACK SURGERY     BASAL CELL CARCINOMA EXCISION     chest or back or left arm   CARDIOVERSION  06/2002   CATARACT EXTRACTION W/ INTRAOCULAR LENS IMPLANT Left ~ 2012   COLONOSCOPY     COLONOSCOPY W/ POLYPECTOMY  02/2003   CYSTOSCOPY W/ STONE MANIPULATION   1980's   "basketted it out; no stent"   ELECTROPHYSIOLOGIC STUDY N/A 05/30/2015   Procedure: Atrial Fibrillation Ablation;  Surgeon: Will Meredith Leeds, MD;  Location: [facility];  Service: Cardiovascular;  Laterality: N/A;   EXTRACORPOREAL SHOCK WAVE LITHOTRIPSY Left 04/12/2016   Procedure: LEFT EXTRACORPOREAL SHOCK WAVE LITHOTRIPSY (ESWL);  Surgeon: Irine Seal, MD;  Location: [facility];  Service: Urology;  Laterality: Left;   INGUINAL HERNIA REPAIR Bilateral 2004   INGUINAL HERNIA REPAIR Bilateral 2004   MOHS SURGERY Right    "temple"   SQUAMOUS CELL CARCINOMA EXCISION     chest or back or left arm   Patient Active Problem List   Diagnosis Date Noted   Moderate episode of recurrent major depressive disorder ([place]) 01/19/2022   Hearing loss due to cerumen impaction, bilateral 07/13/2021   Panlobular emphysema ([place]) 01/07/2021   Neuropathy involving both lower extremities 04/18/2020   Seasonal allergic rhinitis due to pollen 10/24/2018   Essential hypertension 04/25/2018   Laryngopharyngeal reflux (LPR) 02/07/2018   Kidney stone on left side 03/30/2016   IBS (irritable bowel syndrome) 06/27/2015   Coronary artery disease involving native coronary  artery of native heart without angina pectoris 06/26/2015   OAB (overactive bladder) 04/16/2015   Atrial flutter ([place])    Hyperlipidemia with target LDL less than 70 01/18/2014   BPH (benign prostatic hyperplasia) 04/23/2011   Hypothyroidism 04/15/2011   Routine general medical examination at a health care facility 04/15/2011   Atrial fibrillation ([place])     REFERRING DIAG:  M54.2 (ICD-10-CM) - Neck pain  S46.812A (ICD-10-CM) - Trapezius strain, left, initial encounter  M89.9 (ICD-10-CM) - Scapular dysfunction    THERAPY DIAG:  Acute pain of left shoulder  Cervicalgia  Muscle weakness (generalized)  Abnormal posture  Rationale for Evaluation and Treatment Rehabilitation  PERTINENT HISTORY: cervical spondylosis with a prior  fusion at the C5-C7 level around 2009.   PRECAUTIONS: prior cervical fusion  SUBJECTIVE:                                                                                                                                                                                      SUBJECTIVE STATEMENT:   Pt states he gets some tingling in his left shoulder when he stretches into left lateral flexion after returning from Rt lateral flexion   PAIN:  Are you having pain? Yes, varries, but overall not having the deep neck/shoulder pain he was having.    OBJECTIVE: (objective measures completed at initial evaluation unless otherwise dated)   DIAGNOSTIC FINDINGS:  "X-rays demonstrate a prior cervical fusion and from C5-C7. There is grade 1 anterior listhesis of C7 on T1. There is anterior spurring of C4 and notable facet sclerosis from C3-C5. No acute fracture noted. "   PATIENT SURVEYS:  FOTO 62% functional intake at eval   COGNITION: Overall cognitive status: Within functional limits for tasks assessed   SENSATION: WFL   POSTURE: rounded shoulders, has tendency to hike his shoulders   PALPATION: Trigger points and tightness in upper trap             CERVICAL ROM:    Active ROM A/PROM (deg) eval AROM 05/12/22  Flexion 20 35  Extension 40 50  Right lateral flexion 30 33  Left lateral flexion 20 27  Right rotation 50 60  Left rotation 60 65   (Blank rows = not tested)   UPPER EXTREMITY STRENGTH:   MMT Right eval Left eval Bilat 04/07/22  Bilat 05/12/22  Shoulder flexion 4 4 5 $ 4+  Shoulder extension        Shoulder abduction 4 4 5 5  $ Shoulder adduction        Shoulder extension        Shoulder internal rotation 4 4 5 5  $ Shoulder external rotation 4 4  5 5  Elbow flexion        Elbow extension        Wrist flexion        Wrist extension        Wrist ulnar deviation        Wrist radial deviation        Wrist pronation        Wrist supination         (Blank rows = not  tested)   UPPER EXTREMITY ROM   AROM Right eval Left eval  Shoulder flexion WNL WNl  Shoulder extension      Shoulder abduction WNL WNl  Shoulder adduction      Shoulder extension      Shoulder internal rotation WNL WNL  Shoulder external rotation WNL WNL  Middle trapezius      Lower trapezius      Elbow flexion      Elbow extension      Wrist flexion      Wrist extension      Wrist ulnar deviation      Wrist radial deviation      Wrist pronation      Wrist supination      Grip strength       (Blank rows = not tested)   CERVICAL SPECIAL TESTS:  Spurling's test: Negative   FUNCTIONAL TESTS:        TODAY'S TREATMENT:  Self care: HEP modification recommendations, recommendations for home TENS unit if helpful with our treatment today. Manual: Manual cervical distraction 1 minute X 4, neck PROM all planes Ischemic compression and Skilled palpation and Trigger Point Dry-Needling  Treatment instructions: Expect mild to moderate muscle soreness. Patient Consent Given: Yes Education handout provided: verbally provided Muscles treated:left upper trap,cervical P.S, multifidi, supraspinatus Treatment response/outcome: good overall tolerance,twitch response noted   Modalities: Moist heat with IFC Estim to left neck/shoulder X 15 minutes with intensity 8-11   05/12/22 -Row machine #20 2x10 -Lat pull down machine #20 2x10, first set wide grip, second set close grip -Chest press machine #15 2x10 -Shoulder ext with cable machine #5 10x each arm  -Shoulder ER with cable machine 5# 10x each arm  -Sci fit UBE level 3 2.5/2.5 min forward/backward  Manual: Ischemic compression and pin and stretch to left upper trap Skilled palpation and Trigger Point Dry-Needling  Treatment instructions: Expect mild to moderate muscle soreness. Patient Consent Given: Yes Education handout provided: verbally provided Muscles treated:left upper trap,cervical P.S, multifidi Treatment  response/outcome: good overall tolerance,twitch response noted      PATIENT EDUCATION: Education details: HEP, PT plan of care, theracane Person educated: Patient Education method: Explanation, Demonstration, Verbal cues, and Handouts Education comprehension: verbalized understanding and needs further education     HOME EXERCISE PROGRAM: Access Code: [email or web address] URL: [email or web address] Date: 03/17/2022 Prepared by: Elsie Ra   Exercises - Seated Upper Trap Stretch (Mirrored)  - 2-3 x daily - 6 x weekly - 1 sets - 3 reps - 20 sec hold - Seated Levator Scapulae Stretch  - 2-3 x daily - 6 x weekly - 3 reps - 20 sec hold - Standing Scapular Retraction  - 2-3 x daily - 6 x weekly - 1 sets - 10 reps - 5 sec hold - Standing Single Arm Shoulder Abduction with Resistance  - 1 x daily - 6 x weekly - 3 sets - 10 reps - Standing Shoulder Flexion with Resistance  - 1 x daily - 6 x weekly -  3 sets - 10 reps - Standing Shoulder External Rotation with Resistance  - 1 x daily - 6 x weekly - 1-3 sets - 10 reps - Standing Shoulder Horizontal Abduction with Resistance  - 1 x daily - 6 x weekly - 3 sets - 10 reps -Push ups plus 10 sets   ASSESSMENT:   CLINICAL IMPRESSION He had a flare up in his left shoulder pain after lifting mattress. Also he is having new onset of left shoulder numbness/tingling mainly with lateral flexion to his left. I advised him to avoid stretching this way for now and we tried some cervical distraction, DN, and Estim today to see if this further helps him reduce his pain flare up. He will meet with Dr. Rolena Infante again next week for follow up.   OBJECTIVE IMPAIRMENTS: decreased activity tolerance, decreased shoulder mobility, decreased ROM, decreased strength, impaired flexibility, impaired UE use, postural dysfunction, and pain.   ACTIVITY LIMITATIONS: reaching, lifting, carry,  cleaning, driving, and or occupation   PERSONAL FACTORS: cervical spondylosis with a  prior fusion at the C5-C7 level.  also affecting patient's functional outcome.   REHAB POTENTIAL: Good   CLINICAL DECISION MAKING: Stable/uncomplicated   EVALUATION COMPLEXITY: Low       GOALS: Short term PT Goals Target date: 04/14/2022     Pt will be I and compliant with HEP. Baseline:  Goal status: partially MET, it was met but then he was unable to do them all due to COVID 05/12/22 Pt will decrease pain by 25% overall Baseline: Goal status: MET 05/12/22   Long term PT goals Target date:06/18/2022     Pt will improve neck AROM to [facility] to improve functional scanning Baseline: Goal status: ongoing, see updated measurmemts 05/12/22 Pt will improve  Rt shoulder strength to at least 4+/5 MMT to improve functional strength Baseline: Goal status: MET 05/12/22 Pt will improve FOTO to at least 70% functional to show improved function Baseline: Goal status: ongoing Pt will reduce pain to overall less than 3/10 with usual activity and gym activity Baseline: Goal status: ongoing.   PLAN: PT FREQUENCY: 1 times per week    PT DURATION: 6 weeks   PLANNED INTERVENTIONS (unless contraindicated): aquatic PT, Canalith repositioning, cryotherapy, Electrical stimulation, Iontophoresis with 4 mg/ml dexamethasome, Moist heat, traction, Ultrasound, gait training, Therapeutic exercise, balance training, neuromuscular re-education, patient/family education, prosthetic training, manual techniques, passive ROM, dry needling, taping, vasopnuematic device, vestibular, spinal manipulations, joint manipulations   PLAN FOR NEXT SESSION: what did MD say? DN if desired, left shoulder/neck mobility and shoulder/UE strength as tolerated.        Debbe Odea, PT, DPT 05/19/2022, 10:13 AM

## 2022-05-25 ENCOUNTER — Encounter: Payer: Self-pay | Admitting: Sports Medicine

## 2022-05-25 ENCOUNTER — Ambulatory Visit (INDEPENDENT_AMBULATORY_CARE_PROVIDER_SITE_OTHER): Payer: Medicare PPO

## 2022-05-25 ENCOUNTER — Ambulatory Visit: Payer: Self-pay

## 2022-05-25 ENCOUNTER — Ambulatory Visit: Payer: Medicare PPO | Admitting: Sports Medicine

## 2022-05-25 DIAGNOSIS — M542 Cervicalgia: Secondary | ICD-10-CM

## 2022-05-25 DIAGNOSIS — M25571 Pain in right ankle and joints of right foot: Secondary | ICD-10-CM

## 2022-05-25 DIAGNOSIS — Z981 Arthrodesis status: Secondary | ICD-10-CM | POA: Diagnosis not present

## 2022-05-25 DIAGNOSIS — M25471 Effusion, right ankle: Secondary | ICD-10-CM

## 2022-05-25 MED ORDER — METHYLPREDNISOLONE 4 MG PO TBPK: ORAL_TABLET | ORAL | 0 refills | Status: DC

## 2022-05-25 NOTE — Patient Instructions (Signed)
Roy Long,  1.) For your neck:  - continue scheduled physical therapy - continue home stretches and heat  2.) For your ankle/foot - you have swelling within the ankle joint. It is unclear exactly why at this time. Things you will do for this: -Ice the ankle/foot for 20 minutes 2 times a day -You will wear the lace up ankle brace during the day when you are on your feet; may take this off if simply lounging and when sleeping -You will complete 6 days of a prednisone medication taper -We will refrain from walking with her dog for the rest of this week.  If her pain significantly improved she may slowly start to do this starting early next week.  Hold off on consistent walking and exercise until you see me back for follow-up in 10 days. -Follow-up with me in 10 days for reevaluation  *Dr. Rolena Infante

## 2022-05-25 NOTE — Progress Notes (Signed)
Neck is slowly getting better; he had covid and missed 3 PT appointments, but his last PT visit he said went very well Still having radicular pain down into left shoulder    New issue with right foot Unknown injury States it is swelling

## 2022-05-25 NOTE — Progress Notes (Signed)
Roy Long - 72 y.o. male MRN [id number]  Date of birth: [birth year]  Office Visit Note: Visit Date: 05/25/2022 PCP: Janith Lima, MD Referred by: Janith Lima, MD  Subjective: Chief Complaint  Patient presents with   Neck - Follow-up   Right Ankle - Pain   HPI: Roy Long is a pleasant 72 y.o. male who presents today for follow-up of left sided neck pain and new onset right ankle pain and swelling.  Neck pain -this has slowly gotten better.  Unfortunately he did have to miss about 2 weeks of therapy appointments due to an illness.  He did have a last visit where they did try needling and some gentle traction and he states this helped significantly improve his pain.  He still continue with some radicular pain into the left shoulder.  Right foot/ankle -he has noted some swelling and some pain in the ankle joint for about the last week.  He denies any specific injury, no sprain that he is aware of.  He states he noticed the swelling after he was walking his dog.  He tries to do this frequently for both his own exercise and the dog.  He does state he has a history of ankle sprains in the past that will flareup occasionally.  Denies any redness, no systemic symptoms of fever or chills.  Has not had treatment yet for the ankle as he was unsure what to proceed with based on mixed reviews online.  Pertinent ROS were reviewed with the patient and found to be negative unless otherwise specified above in HPI.   Assessment & Plan: Visit Diagnoses:  1. Pain in right ankle and joints of right foot   2. Ankle effusion, right   3. Neck pain   4. History of fusion of cervical spine    Plan: In terms of his neck pain, he does have a history of C5-C7 ACDF and has a possibility of upper adjacent segment disease, however he has been making improvements with recent therapy.  He thinks he will continue trigger point injections, physical therapy and gentle traction at PT. I will hold off on  further imaging at this point.  But if his symptoms do not continue to improve with physical therapy and conservative treatment, next steps would be getting MRI of the cervical spine.  He has had a new onset of right ankle swelling and pain, limited ultrasound does show a mild to moderate effusion of the ankle.  He has no history of gout, it is not red nor significantly warm, so I very much doubt this.  Unsure of the exact etiology at this time, but do think he needs modified rest.  Did fit him for an ASO ankle brace that he will wear for support and stability.  I would like him to withhold from walking for the next week to let this rest.  He will ice twice daily.  Attempting calm down the swelling and inflammation, we will do a 6-day prednisone taper.  I would like to see him back in about 10 days for reevaluation.  If he still has significant swelling of the ankle, we may consider an intra-articular CSI.  Other options would be consideration of more advanced imaging of the ankle, but we will hold off at this time. F/u in 10-days.  Follow-up: Return in about 10 days (around 06/04/2022) for for right ankle pain/swelling.   Meds & Orders:  Meds ordered this encounter  Medications  methylPREDNISolone (MEDROL DOSEPAK) 4 MG TBPK tablet    Sig: Take as directed by pharmacists; 6 day    Dispense:  21 tablet    Refill:  0    Orders Placed This Encounter  Procedures   XR Foot 2 Views Right   XR Ankle 2 Views Right   [place] Extrem Low Right Ltd     Procedures: No procedures performed      Clinical History: No specialty comments available.  He reports that he quit smoking about 39 years ago. His smoking use included cigarettes. He has a 51.00 pack-year smoking history. He has never been exposed to tobacco smoke. He has never used smokeless tobacco. No results for input(s): "HGBA1C", "LABURIC" in the last 8760 hours.  Objective:   Physical Exam  Gen: Well-appearing, in no acute distress; non-toxic CV:  Regular Rate. Well-perfused. Warm.  Resp: Breathing unlabored on room air; no wheezing. Psych: Fluid speech in conversation; appropriate affect; normal thought process Neuro: Sensation intact throughout. No gross coordination deficits.   Ortho Exam - Neck: No midline spinous process TTP.  There is slight restriction in both flexion and extension from his prior ACDF.  Improved hypertonicity, but more left-sided paraspinal and trapezius hypertonicity compared to the right.  5/5 strength of upper extremities.  Negative Spurling's test today.  - Right ankle: There is full range of motion in all directions, although slightly painful.  There is swelling and a mild-moderate ankle effusion noted.  No warmth or redness.  Strength testing deferred today given swelling and pain.  NVI.  Imaging: [place] Extrem Low Right Ltd  Result Date: 05/25/2022 Limited musculoskeletal ultrasound of the right lower extremity, right ankle was performed today.  There is a Erlinda Hong shows intact ankle mortise without significant cortical irregularity.  Evaluation of the medial ankle joint and the lateral gutter of the ankle joint overlying the ATFL region shows a small-mod effusion with hyperemia associated.  Subtalar joint appears without any abnormality.  XR Foot 2 Views Right  Result Date: 05/25/2022 2 views of the right foot including AP and lateral femoral ordered and reviewed by myself.  X-rays show some soft tissue swelling over the anterior lateral aspect of the ankle.  There is no acute bony fractures or osseous abnormality.  Possible accessory navicular noted.   XR Ankle 2 Views Right  Result Date: 05/25/2022 2 views of the right ankle including AP and mortise view were ordered and reviewed by myself.  X-rays demonstrate an intact ankle mortise without any widening or instability.  No acute fracture noted.   Past Medical/Family/Surgical/Social History: Medications & Allergies reviewed per EMR, new medications  updated. Patient Active Problem List   Diagnosis Date Noted   Moderate episode of recurrent major depressive disorder ([place]) 01/19/2022   Hearing loss due to cerumen impaction, bilateral 07/13/2021   Panlobular emphysema ([place]) 01/07/2021   Neuropathy involving both lower extremities 04/18/2020   Seasonal allergic rhinitis due to pollen 10/24/2018   Essential hypertension 04/25/2018   Laryngopharyngeal reflux (LPR) 02/07/2018   Kidney stone on left side 03/30/2016   IBS (irritable bowel syndrome) 06/27/2015   Coronary artery disease involving native coronary artery of native heart without angina pectoris 06/26/2015   OAB (overactive bladder) 04/16/2015   Atrial flutter ([place])    Hyperlipidemia with target LDL less than 70 01/18/2014   BPH (benign prostatic hyperplasia) 04/23/2011   Hypothyroidism 04/15/2011   Routine general medical examination at a health care facility 04/15/2011   Atrial fibrillation ([place])  Past Medical History:  Diagnosis Date   Age-related macular degeneration, dry, left eye    Alcohol dependence ([place])    Arthritis    "maybe a little bit in my left knee and right forefinger" (05/30/2015)   Atrial fibrillation ([place])    ablation 05-2015 with no reoccurance as of 09-2015   Basal cell carcinoma    Depression    hx   Family history of adverse reaction to anesthesia    "father had head injuries and dementia; everytime he was put under less of his mentation came back"   Gastroparesis    GERD (gastroesophageal reflux disease)    History of kidney stones    Hypercholesterolemia dx'd 02/2015   Hypertension    Hypothyroidism    Kidney stones    Migraine aura without headache    "very rare now" (05/30/2015)   Pneumonia ~ 2004   Squamous carcinoma    hand, chest   Family History  Problem Relation Age of Onset   Stroke Brother    Cancer Brother        basosquamous cell carcinoma-head   Heart failure Mother    Colon cancer Cousin 64   Heart disease Neg Hx     Hyperlipidemia Neg Hx    Hypertension Neg Hx    Diabetes Neg Hx    Past Surgical History:  Procedure Laterality Date   ANTERIOR CERVICAL DECOMP/DISCECTOMY FUSION  04/2009   ATRIAL FIBRILLATION ABLATION  05/30/2015   BACK SURGERY     BASAL CELL CARCINOMA EXCISION     chest or back or left arm   CARDIOVERSION  06/2002   CATARACT EXTRACTION W/ INTRAOCULAR LENS IMPLANT Left ~ 2012   COLONOSCOPY     COLONOSCOPY W/ POLYPECTOMY  02/2003   CYSTOSCOPY W/ STONE MANIPULATION  1980's   "basketted it out; no stent"   ELECTROPHYSIOLOGIC STUDY N/A 05/30/2015   Procedure: Atrial Fibrillation Ablation;  Surgeon: Will Meredith Leeds, MD;  Location: [place] [facility];  Service: Cardiovascular;  Laterality: N/A;   EXTRACORPOREAL SHOCK WAVE LITHOTRIPSY Left 04/12/2016   Procedure: LEFT EXTRACORPOREAL SHOCK WAVE LITHOTRIPSY (ESWL);  Surgeon: Irine Seal, MD;  Location: [facility];  Service: Urology;  Laterality: Left;   INGUINAL HERNIA REPAIR Bilateral 2004   INGUINAL HERNIA REPAIR Bilateral 2004   MOHS SURGERY Right    "temple"   SQUAMOUS CELL CARCINOMA EXCISION     chest or back or left arm   Social History   Occupational History   Occupation: band Pharmacist, hospital    Comment: retired  Tobacco Use   Smoking status: Former    Packs/day: 3.00    Years: 17.00    Total pack years: 51.00    Types: Cigarettes    Quit date: 03/30/1983    Years since quitting: 39.1    Passive exposure: Never   Smokeless tobacco: Never  Vaping Use   Vaping Use: Never used  Substance and Sexual Activity   Alcohol use: No    Alcohol/week: 0.0 standard drinks of alcohol    Comment: 05/30/2015 "recovering alcoholic; dry date [id number]"   Drug use: No    Types: Marijuana, LSD, Mescaline    Comment: 03/07/2015 "stopped all drugs back in the 1980's"   Sexual activity: Not Currently    Birth control/protection: None

## 2022-05-26 ENCOUNTER — Ambulatory Visit: Payer: Medicare PPO | Admitting: Physical Therapy

## 2022-05-26 ENCOUNTER — Encounter: Payer: Self-pay | Admitting: Physical Therapy

## 2022-05-26 DIAGNOSIS — M6281 Muscle weakness (generalized): Secondary | ICD-10-CM

## 2022-05-26 DIAGNOSIS — M542 Cervicalgia: Secondary | ICD-10-CM

## 2022-05-26 DIAGNOSIS — M25512 Pain in left shoulder: Secondary | ICD-10-CM

## 2022-05-26 DIAGNOSIS — R293 Abnormal posture: Secondary | ICD-10-CM | POA: Diagnosis not present

## 2022-05-26 NOTE — Therapy (Signed)
OUTPATIENT PHYSICAL THERAPY TREATMENT NOTE   Patient Name: Roy Long MRN: [id number] DOB:02/27/1951, 72 y.o., male Today's Date: 05/26/2022  PCP: Janith Lima, MD   REFERRING PROVIDER: Elba Barman, DO   END OF SESSION:   PT End of Session - 05/26/22 1032     Visit Number 7    Number of Visits 12    Date for PT Re-Evaluation 06/18/22    Authorization Type [employer]    Authorization Time Period 12 visits until 3/22    Authorization - Visit Number 7    Authorization - Number of Visits 12    PT Start Time 1017    PT Stop Time 1100    PT Time Calculation (min) 43 min    Activity Tolerance Patient tolerated treatment well    Behavior During Therapy WFL for tasks assessed/performed                Past Medical History:  Diagnosis Date   Age-related macular degeneration, dry, left eye    Alcohol dependence ([place])    Arthritis    "maybe a little bit in my left knee and right forefinger" (05/30/2015)   Atrial fibrillation ([place])    ablation 05-2015 with no reoccurance as of 09-2015   Basal cell carcinoma    Depression    hx   Family history of adverse reaction to anesthesia    "father had head injuries and dementia; everytime he was put under less of his mentation came back"   Gastroparesis    GERD (gastroesophageal reflux disease)    History of kidney stones    Hypercholesterolemia dx'd 02/2015   Hypertension    Hypothyroidism    Kidney stones    Migraine aura without headache    "very rare now" (05/30/2015)   Pneumonia ~ 2004   Squamous carcinoma    hand, chest   Past Surgical History:  Procedure Laterality Date   ANTERIOR CERVICAL DECOMP/DISCECTOMY FUSION  04/2009   ATRIAL FIBRILLATION ABLATION  05/30/2015   BACK SURGERY     BASAL CELL CARCINOMA EXCISION     chest or back or left arm   CARDIOVERSION  06/2002   CATARACT EXTRACTION W/ INTRAOCULAR LENS IMPLANT Left ~ 2012   COLONOSCOPY     COLONOSCOPY W/ POLYPECTOMY  02/2003   CYSTOSCOPY W/ STONE MANIPULATION   1980's   "basketted it out; no stent"   ELECTROPHYSIOLOGIC STUDY N/A 05/30/2015   Procedure: Atrial Fibrillation Ablation;  Surgeon: Will Meredith Leeds, MD;  Location: [facility];  Service: Cardiovascular;  Laterality: N/A;   EXTRACORPOREAL SHOCK WAVE LITHOTRIPSY Left 04/12/2016   Procedure: LEFT EXTRACORPOREAL SHOCK WAVE LITHOTRIPSY (ESWL);  Surgeon: Irine Seal, MD;  Location: [facility];  Service: Urology;  Laterality: Left;   INGUINAL HERNIA REPAIR Bilateral 2004   INGUINAL HERNIA REPAIR Bilateral 2004   MOHS SURGERY Right    "temple"   SQUAMOUS CELL CARCINOMA EXCISION     chest or back or left arm   Patient Active Problem List   Diagnosis Date Noted   Moderate episode of recurrent major depressive disorder ([place]) 01/19/2022   Hearing loss due to cerumen impaction, bilateral 07/13/2021   Panlobular emphysema ([place]) 01/07/2021   Neuropathy involving both lower extremities 04/18/2020   Seasonal allergic rhinitis due to pollen 10/24/2018   Essential hypertension 04/25/2018   Laryngopharyngeal reflux (LPR) 02/07/2018   Kidney stone on left side 03/30/2016   IBS (irritable bowel syndrome) 06/27/2015   Coronary artery disease involving native coronary  artery of native heart without angina pectoris 06/26/2015   OAB (overactive bladder) 04/16/2015   Atrial flutter ([place])    Hyperlipidemia with target LDL less than 70 01/18/2014   BPH (benign prostatic hyperplasia) 04/23/2011   Hypothyroidism 04/15/2011   Routine general medical examination at a health care facility 04/15/2011   Atrial fibrillation ([place])     REFERRING DIAG:  M54.2 (ICD-10-CM) - Neck pain  S46.812A (ICD-10-CM) - Trapezius strain, left, initial encounter  M89.9 (ICD-10-CM) - Scapular dysfunction    THERAPY DIAG:  Acute pain of left shoulder  Cervicalgia  Muscle weakness (generalized)  Abnormal posture  Rationale for Evaluation and Treatment Rehabilitation  PERTINENT HISTORY: cervical spondylosis with a prior  fusion at the C5-C7 level around 2009.   PRECAUTIONS: prior cervical fusion  SUBJECTIVE:                                                                                                                                                                                      SUBJECTIVE STATEMENT:   Pt relays about 5/10 pain in left neck/shoulder, he had to lift the mattress again and this may have aggravated his arm, unsure.  PAIN:  Are you having pain? Yes, varries, but overall not having the deep neck/shoulder pain he was having.    OBJECTIVE: (objective measures completed at initial evaluation unless otherwise dated)   DIAGNOSTIC FINDINGS:  "X-rays demonstrate a prior cervical fusion and from C5-C7. There is grade 1 anterior listhesis of C7 on T1. There is anterior spurring of C4 and notable facet sclerosis from C3-C5. No acute fracture noted. "   PATIENT SURVEYS:  FOTO 62% functional intake at eval   COGNITION: Overall cognitive status: Within functional limits for tasks assessed   SENSATION: WFL   POSTURE: rounded shoulders, has tendency to hike his shoulders   PALPATION: Trigger points and tightness in upper trap             CERVICAL ROM:    Active ROM A/PROM (deg) eval AROM 05/12/22  Flexion 20 35  Extension 40 50  Right lateral flexion 30 33  Left lateral flexion 20 27  Right rotation 50 60  Left rotation 60 65   (Blank rows = not tested)   UPPER EXTREMITY STRENGTH:   MMT Right eval Left eval Bilat 04/07/22  Bilat 05/12/22  Shoulder flexion '4 4 5 '$ 4+  Shoulder extension        Shoulder abduction '4 4 5 5  '$ Shoulder adduction        Shoulder extension        Shoulder internal rotation '4 4 5 5  '$ Shoulder external rotation 4 4 5  5  Elbow flexion        Elbow extension        Wrist flexion        Wrist extension        Wrist ulnar deviation        Wrist radial deviation        Wrist pronation        Wrist supination         (Blank rows = not tested)    UPPER EXTREMITY ROM   AROM Right eval Left eval  Shoulder flexion WNL WNl  Shoulder extension      Shoulder abduction WNL WNl  Shoulder adduction      Shoulder extension      Shoulder internal rotation WNL WNL  Shoulder external rotation WNL WNL  Middle trapezius      Lower trapezius      Elbow flexion      Elbow extension      Wrist flexion      Wrist extension      Wrist ulnar deviation      Wrist radial deviation      Wrist pronation      Wrist supination      Grip strength       (Blank rows = not tested)   CERVICAL SPECIAL TESTS:  Spurling's test: Negative   FUNCTIONAL TESTS:        TODAY'S TREATMENT:  05/26/22 Mechanical cervical traction 12-7# X 15 min Manual: Ischemic compression and Skilled palpation with Trigger Point Dry-Needling  Treatment instructions: Expect mild to moderate muscle soreness. Patient Consent Given: Yes Education handout provided: verbally provided Muscles treated:left upper trap,cervical P.S, multifidi, supraspinatus Treatment response/outcome: good overall tolerance,twitch response noted   Modalities: Moist heat with pre mod Estim to left neck/shoulder X 15 minutes with intensity 13-21      PATIENT EDUCATION: Education details: HEP, PT plan of care, theracane Person educated: Patient Education method: Explanation, Demonstration, Verbal cues, and Handouts Education comprehension: verbalized understanding and needs further education     HOME EXERCISE PROGRAM: Access Code: [email or web address] URL: [email or web address] Date: 03/17/2022 Prepared by: Elsie Ra   Exercises - Seated Upper Trap Stretch (Mirrored)  - 2-3 x daily - 6 x weekly - 1 sets - 3 reps - 20 sec hold - Seated Levator Scapulae Stretch  - 2-3 x daily - 6 x weekly - 3 reps - 20 sec hold - Standing Scapular Retraction  - 2-3 x daily - 6 x weekly - 1 sets - 10 reps - 5 sec hold - Standing Single Arm Shoulder Abduction with Resistance  - 1 x daily - 6 x weekly  - 3 sets - 10 reps - Standing Shoulder Flexion with Resistance  - 1 x daily - 6 x weekly - 3 sets - 10 reps - Standing Shoulder External Rotation with Resistance  - 1 x daily - 6 x weekly - 1-3 sets - 10 reps - Standing Shoulder Horizontal Abduction with Resistance  - 1 x daily - 6 x weekly - 3 sets - 10 reps -Push ups plus 10 sets   ASSESSMENT:   CLINICAL IMPRESSION Due to improvement noted from him last visit after manual traction, we did try some light mechanical cervical traction ( kept it to 12-7# due to history of cervical fusion) along with DN and Estim to decrease his overall pain. We will assess his response to the mechanical traction vs manual traction next visit.    OBJECTIVE IMPAIRMENTS: decreased  activity tolerance, decreased shoulder mobility, decreased ROM, decreased strength, impaired flexibility, impaired UE use, postural dysfunction, and pain.   ACTIVITY LIMITATIONS: reaching, lifting, carry,  cleaning, driving, and or occupation   PERSONAL FACTORS: cervical spondylosis with a prior fusion at the C5-C7 level.  also affecting patient's functional outcome.   REHAB POTENTIAL: Good   CLINICAL DECISION MAKING: Stable/uncomplicated   EVALUATION COMPLEXITY: Low       GOALS: Short term PT Goals Target date: 04/14/2022     Pt will be I and compliant with HEP. Baseline:  Goal status: partially MET, it was met but then he was unable to do them all due to COVID 05/12/22 Pt will decrease pain by 25% overall Baseline: Goal status: MET 05/12/22   Long term PT goals Target date:06/18/2022     Pt will improve neck AROM to [facility] to improve functional scanning Baseline: Goal status: ongoing, see updated measurmemts 05/12/22 Pt will improve  Rt shoulder strength to at least 4+/5 MMT to improve functional strength Baseline: Goal status: MET 05/12/22 Pt will improve FOTO to at least 70% functional to show improved function Baseline: Goal status: ongoing Pt will reduce pain to  overall less than 3/10 with usual activity and gym activity Baseline: Goal status: ongoing.   PLAN: PT FREQUENCY: 1 times per week    PT DURATION: 6 weeks   PLANNED INTERVENTIONS (unless contraindicated): aquatic PT, Canalith repositioning, cryotherapy, Electrical stimulation, Iontophoresis with 4 mg/ml dexamethasome, Moist heat, traction, Ultrasound, gait training, Therapeutic exercise, balance training, neuromuscular re-education, patient/family education, prosthetic training, manual techniques, passive ROM, dry needling, taping, vasopnuematic device, vestibular, spinal manipulations, joint manipulations   PLAN FOR NEXT SESSION: how was light traction from last time? DN and or TENS if desired, left shoulder/neck mobility and shoulder/UE strength as tolerated.        Debbe Odea, PT, DPT 05/26/2022, 10:33 AM

## 2022-06-02 ENCOUNTER — Ambulatory Visit: Payer: Medicare PPO | Admitting: Physical Therapy

## 2022-06-02 ENCOUNTER — Encounter: Payer: Self-pay | Admitting: Physical Therapy

## 2022-06-02 DIAGNOSIS — M542 Cervicalgia: Secondary | ICD-10-CM | POA: Diagnosis not present

## 2022-06-02 DIAGNOSIS — M6281 Muscle weakness (generalized): Secondary | ICD-10-CM

## 2022-06-02 DIAGNOSIS — M25512 Pain in left shoulder: Secondary | ICD-10-CM

## 2022-06-02 DIAGNOSIS — R293 Abnormal posture: Secondary | ICD-10-CM

## 2022-06-02 NOTE — Therapy (Signed)
OUTPATIENT PHYSICAL THERAPY TREATMENT NOTE   Patient Name: Roy Long MRN: [id number] DOB:[birth year], 72 y.o., male Today's Date: 06/02/2022  PCP: Janith Lima, MD   REFERRING PROVIDER: Elba Barman, DO   END OF SESSION:   PT End of Session - 06/02/22 1420     Visit Number 8    Number of Visits 12    Date for PT Re-Evaluation 06/18/22    Authorization Type [employer]    Authorization Time Period 12 visits until 3/22    Authorization - Visit Number 8    Authorization - Number of Visits 12    PT Start Time [id number]    PT Stop Time [id number]    PT Time Calculation (min) 38 min    Activity Tolerance Patient tolerated treatment well    Behavior During Therapy WFL for tasks assessed/performed                 Past Medical History:  Diagnosis Date   Age-related macular degeneration, dry, left eye    Alcohol dependence ([place])    Arthritis    "maybe a little bit in my left knee and right forefinger" (05/30/2015)   Atrial fibrillation ([place])    ablation 05-2015 with no reoccurance as of 09-2015   Basal cell carcinoma    Depression    hx   Family history of adverse reaction to anesthesia    "father had head injuries and dementia; everytime he was put under less of his mentation came back"   Gastroparesis    GERD (gastroesophageal reflux disease)    History of kidney stones    Hypercholesterolemia dx'd 02/2015   Hypertension    Hypothyroidism    Kidney stones    Migraine aura without headache    "very rare now" (05/30/2015)   Pneumonia ~ 2004   Squamous carcinoma    hand, chest   Past Surgical History:  Procedure Laterality Date   ANTERIOR CERVICAL DECOMP/DISCECTOMY FUSION  04/2009   ATRIAL FIBRILLATION ABLATION  05/30/2015   BACK SURGERY     BASAL CELL CARCINOMA EXCISION     chest or back or left arm   CARDIOVERSION  06/2002   CATARACT EXTRACTION W/ INTRAOCULAR LENS IMPLANT Left ~ 2012   COLONOSCOPY     COLONOSCOPY W/ POLYPECTOMY  02/2003   CYSTOSCOPY W/ STONE MANIPULATION   1980's   "basketted it out; no stent"   ELECTROPHYSIOLOGIC STUDY N/A 05/30/2015   Procedure: Atrial Fibrillation Ablation;  Surgeon: Will Meredith Leeds, MD;  Location: [facility];  Service: Cardiovascular;  Laterality: N/A;   EXTRACORPOREAL SHOCK WAVE LITHOTRIPSY Left 04/12/2016   Procedure: LEFT EXTRACORPOREAL SHOCK WAVE LITHOTRIPSY (ESWL);  Surgeon: Irine Seal, MD;  Location: [facility];  Service: Urology;  Laterality: Left;   INGUINAL HERNIA REPAIR Bilateral 2004   INGUINAL HERNIA REPAIR Bilateral 2004   MOHS SURGERY Right    "temple"   SQUAMOUS CELL CARCINOMA EXCISION     chest or back or left arm   Patient Active Problem List   Diagnosis Date Noted   Moderate episode of recurrent major depressive disorder ([place]) 01/19/2022   Hearing loss due to cerumen impaction, bilateral 07/13/2021   Panlobular emphysema ([place]) 01/07/2021   Neuropathy involving both lower extremities 04/18/2020   Seasonal allergic rhinitis due to pollen 10/24/2018   Essential hypertension 04/25/2018   Laryngopharyngeal reflux (LPR) 02/07/2018   Kidney stone on left side 03/30/2016   IBS (irritable bowel syndrome) 06/27/2015   Coronary artery disease involving native  coronary artery of native heart without angina pectoris 06/26/2015   OAB (overactive bladder) 04/16/2015   Atrial flutter ([place])    Hyperlipidemia with target LDL less than 70 01/18/2014   BPH (benign prostatic hyperplasia) 04/23/2011   Hypothyroidism 04/15/2011   Routine general medical examination at a health care facility 04/15/2011   Atrial fibrillation ([facility])     REFERRING DIAG:  M54.2 (ICD-10-CM) - Neck pain  S46.812A (ICD-10-CM) - Trapezius strain, left, initial encounter  M89.9 (ICD-10-CM) - Scapular dysfunction    THERAPY DIAG:  Acute pain of left shoulder  Cervicalgia  Muscle weakness (generalized)  Abnormal posture  Rationale for Evaluation and Treatment Rehabilitation  PERTINENT HISTORY: cervical spondylosis with a prior  fusion at the C5-C7 level around 2009.   PRECAUTIONS: prior cervical fusion  SUBJECTIVE:                                                                                                                                                                                      SUBJECTIVE STATEMENT:   Pain increased with trimming his blueberry bushes, but was doing well up until then.  PAIN:  Are you having pain? Yes, varries, but overall not having the deep neck/shoulder pain he was having.    OBJECTIVE: (objective measures completed at initial evaluation unless otherwise dated)   DIAGNOSTIC FINDINGS:  "X-rays demonstrate a prior cervical fusion and from C5-C7. There is grade 1 anterior listhesis of C7 on T1. There is anterior spurring of C4 and notable facet sclerosis from C3-C5. No acute fracture noted. "   PATIENT SURVEYS:  FOTO 62% functional intake at eval   COGNITION: Overall cognitive status: Within functional limits for tasks assessed   SENSATION: WFL   POSTURE: rounded shoulders, has tendency to hike his shoulders   PALPATION: Trigger points and tightness in upper trap             CERVICAL ROM:    Active ROM A/PROM (deg) eval AROM 05/12/22  Flexion 20 35  Extension 40 50  Right lateral flexion 30 33  Left lateral flexion 20 27  Right rotation 50 60  Left rotation 60 65   (Blank rows = not tested)   UPPER EXTREMITY STRENGTH:   MMT Right eval Left eval Bilat 04/07/22  Bilat 05/12/22  Shoulder flexion '4 4 5 '$ 4+  Shoulder extension        Shoulder abduction '4 4 5 5  '$ Shoulder adduction        Shoulder extension        Shoulder internal rotation '4 4 5 5  '$ Shoulder external rotation '4 4 5 5   '$ (Blank rows = not tested)  UPPER EXTREMITY ROM   AROM Right eval Left eval  Shoulder flexion WNL WNl  Shoulder extension      Shoulder abduction WNL WNl  Shoulder adduction      Shoulder extension      Shoulder internal rotation WNL WNL  Shoulder external rotation  WNL WNL   (Blank rows = not tested)   CERVICAL SPECIAL TESTS:  Spurling's test: Negative     TODAY'S TREATMENT:  06/02/22 Mechanical cervical traction 12-7# X 15 min  Manual: Ischemic compression and Skilled palpation with Trigger Point Dry-Needling  Treatment instructions: Expect mild to moderate muscle soreness. Patient Consent Given: Yes Education handout provided: verbally provided Muscles treated:left upper trap Treatment response/outcome: good overall tolerance,twitch response noted   Modalities: Moist heat with pre mod Estim to left neck/shoulder X 15 minutes with intensity 20-23 (with traction)  05/26/22 Mechanical cervical traction 12-7# X 15 min Manual: Ischemic compression and Skilled palpation with Trigger Point Dry-Needling  Treatment instructions: Expect mild to moderate muscle soreness. Patient Consent Given: Yes Education handout provided: verbally provided Muscles treated:left upper trap,cervical P.S, multifidi, supraspinatus Treatment response/outcome: good overall tolerance,twitch response noted   Modalities: Moist heat with pre mod Estim to left neck/shoulder X 15 minutes with intensity 13-21      PATIENT EDUCATION: Education details: HEP, PT plan of care, theracane Person educated: Patient Education method: Explanation, Demonstration, Verbal cues, and Handouts Education comprehension: verbalized understanding and needs further education     HOME EXERCISE PROGRAM: Access Code: [email or web address] URL: [email or web address] Date: 03/17/2022 Prepared by: Elsie Ra   Exercises - Seated Upper Trap Stretch (Mirrored)  - 2-3 x daily - 6 x weekly - 1 sets - 3 reps - 20 sec hold - Seated Levator Scapulae Stretch  - 2-3 x daily - 6 x weekly - 3 reps - 20 sec hold - Standing Scapular Retraction  - 2-3 x daily - 6 x weekly - 1 sets - 10 reps - 5 sec hold - Standing Single Arm Shoulder Abduction with Resistance  - 1 x daily - 6 x weekly - 3 sets - 10  reps - Standing Shoulder Flexion with Resistance  - 1 x daily - 6 x weekly - 3 sets - 10 reps - Standing Shoulder External Rotation with Resistance  - 1 x daily - 6 x weekly - 1-3 sets - 10 reps - Standing Shoulder Horizontal Abduction with Resistance  - 1 x daily - 6 x weekly - 3 sets - 10 reps -Push ups plus 10 sets   ASSESSMENT:   CLINICAL IMPRESSION Pt tolerated session well today with good response to DN and continued cervical traction and estim as that was helpful last session.  Will continue to benefit from PT to maximize function.   OBJECTIVE IMPAIRMENTS: decreased activity tolerance, decreased shoulder mobility, decreased ROM, decreased strength, impaired flexibility, impaired UE use, postural dysfunction, and pain.   ACTIVITY LIMITATIONS: reaching, lifting, carry,  cleaning, driving, and or occupation   PERSONAL FACTORS: cervical spondylosis with a prior fusion at the C5-C7 level.  also affecting patient's functional outcome.   REHAB POTENTIAL: Good   CLINICAL DECISION MAKING: Stable/uncomplicated   EVALUATION COMPLEXITY: Low       GOALS: Short term PT Goals Target date: 04/14/2022     Pt will be I and compliant with HEP. Baseline:  Goal status: partially MET, it was met but then he was unable to do them all due to COVID 05/12/22 Pt will decrease pain by 25% overall Baseline: Goal  status: MET 05/12/22   Long term PT goals Target date:06/18/2022     Pt will improve neck AROM to [facility] to improve functional scanning Baseline: Goal status: ongoing, see updated measurmemts 05/12/22 Pt will improve  Rt shoulder strength to at least 4+/5 MMT to improve functional strength Baseline: Goal status: MET 05/12/22 Pt will improve FOTO to at least 70% functional to show improved function Baseline: Goal status: ongoing Pt will reduce pain to overall less than 3/10 with usual activity and gym activity Baseline: Goal status: ongoing.   PLAN: PT FREQUENCY: 1 times per week     PT DURATION: 6 weeks   PLANNED INTERVENTIONS (unless contraindicated): aquatic PT, Canalith repositioning, cryotherapy, Electrical stimulation, Iontophoresis with 4 mg/ml dexamethasome, Moist heat, traction, Ultrasound, gait training, Therapeutic exercise, balance training, neuromuscular re-education, patient/family education, prosthetic training, manual techniques, passive ROM, dry needling, taping, vasopnuematic device, vestibular, spinal manipulations, joint manipulations   PLAN FOR NEXT SESSION: continue traction, DN and or TENS if desired, left shoulder/neck mobility and shoulder/UE strength as tolerated.        Faustino Congress, PT, DPT 06/02/2022, 2:58 PM

## 2022-06-04 ENCOUNTER — Ambulatory Visit: Payer: Medicare PPO | Admitting: Sports Medicine

## 2022-06-04 ENCOUNTER — Encounter: Payer: Self-pay | Admitting: Sports Medicine

## 2022-06-04 DIAGNOSIS — M25471 Effusion, right ankle: Secondary | ICD-10-CM | POA: Diagnosis not present

## 2022-06-04 DIAGNOSIS — Z981 Arthrodesis status: Secondary | ICD-10-CM | POA: Diagnosis not present

## 2022-06-04 DIAGNOSIS — M542 Cervicalgia: Secondary | ICD-10-CM | POA: Diagnosis not present

## 2022-06-04 DIAGNOSIS — M25571 Pain in right ankle and joints of right foot: Secondary | ICD-10-CM

## 2022-06-04 NOTE — Progress Notes (Signed)
Ankle is doing better; brace seems to be helping  Went to PT last week for neck; felt great  On Sunday, he worked in the yard and exacerbated his neck pain

## 2022-06-04 NOTE — Progress Notes (Signed)
Roy Long - 72 y.o. male MRN [id number]  Date of birth: [birth year]  Office Visit Note: Visit Date: 06/04/2022 PCP: Janith Lima, MD Referred by: Janith Lima, MD  Subjective: Chief Complaint  Patient presents with   Right Ankle - Follow-up   HPI: Roy Long is a pleasant 71 y.o. male who presents today for follow-up of right ankle pain and effusion, neck and left trapezius pain.  Right ankle -reports that the ankle is doing markedly improved.  He is at least 90-95% better.  Has minimal to no pain.  Still some very mild residual swelling.  He has gotten back to walking his dog outside, not quite the long distances as he has done before.  However no new flareup.  Had great improvement from the methylprednisolone taper pack, he has since completed this.  Neck pain -since her previous trigger point injections, his physical therapy, the neck pain is improved from prior.  He still has up-and-down days.  He did do some yard work recently and that exacerbated the pain.  He is continuing physical therapy here, treatments have been helpful are cervical traction, skin and dry needling.  Will some tightness in the trapezius muscles intermittent radicular pain over the anterior part of the shoulder, denies any numbness tingling going down past the elbow or into the hands.  Pertinent ROS were reviewed with the patient and found to be negative unless otherwise specified above in HPI.   Assessment & Plan: Visit Diagnoses:  1. Pain in right ankle and joints of right foot   2. Ankle effusion, right   3. Neck pain   4. History of fusion of cervical spine    Plan: Discussed with Cruise I am very pleased he has had such marked improvement from his right ankle pain and effusion with the methylprednisolone taper, this does lead to this being a form of synovitis.  He will continue in the ASO ankle brace only with walking and activity on uneven surfaces, may take it off otherwise.  Did discuss  return to walking program back to his longer distance over the next 2-3 weeks.  For his neck pain, he is making improvements with formalized physical therapy.  I would like to see what sort of improvement he gets after his next 4-5 sessions of PT and then we will reevaluate.  Can always consider repeat trigger point injections.  Did discuss today if he has any or radicular symptoms that he felt previously to his cervical fusion, next steps would be obtaining cervical MRI, although I am very hopeful we do not need to do this.  My athletic trainer did review cervical isometrics home therapy for him to add to his HEP at home. May take Tylenol as needed.   Follow-up: Return for follow-up with Rolena Infante after completion of PT.   Meds & Orders: No orders of the defined types were placed in this encounter.  No orders of the defined types were placed in this encounter.    Procedures: No procedures performed      Clinical History: No specialty comments available.  He reports that he quit smoking about 39 years ago. His smoking use included cigarettes. He has a 51.00 pack-year smoking history. He has never been exposed to tobacco smoke. He has never used smokeless tobacco. No results for input(s): "HGBA1C", "LABURIC" in the last 8760 hours.  Objective:    Physical Exam  Gen: Well-appearing, in no acute distress; non-toxic CV: Well-perfused. Warm.  Resp:  Breathing unlabored on room air; no wheezing. Psych: Fluid speech in conversation; appropriate affect; normal thought process Neuro: Sensation intact throughout. No gross coordination deficits.   Ortho Exam - Neck: Slight restriction in both flexion and extension from his prior ACDF that appears at baseline.  There are some left-sided paraspinal trapezius hypertonicity.  - Right ankle: There is a very trace effusion in the right ankle although markedly improved.  No warmth or redness.  He has full range of motion about the ankle in all directions.  No  pain or instability with inversion/eversion stress testing.  NVI.  Imaging:  Previous imaging: [place] Extrem Low Right Ltd Limited musculoskeletal ultrasound of the right lower extremity, right  ankle was performed today.  There is a Erlinda Hong shows intact ankle mortise  without significant cortical irregularity.  Evaluation of the medial ankle  joint and the lateral gutter of the ankle joint overlying the ATFL region  shows a small-mod effusion with hyperemia associated.  Subtalar joint  appears without any abnormality. XR Foot 2 Views Right 2 views of the right foot including AP and lateral femoral ordered and  reviewed by myself.  X-rays show some soft tissue swelling over the  anterior lateral aspect of the ankle.  There is no acute bony fractures or  osseous abnormality.  Possible accessory navicular noted. XR Ankle 2 Views Right 2 views of the right ankle including AP and mortise view were ordered and  reviewed by myself.  X-rays demonstrate an intact ankle mortise without  any widening or instability.  No acute fracture noted.    Past Medical/Family/Surgical/Social History: Medications & Allergies reviewed per EMR, new medications updated. Patient Active Problem List   Diagnosis Date Noted   Moderate episode of recurrent major depressive disorder ([place]) 01/19/2022   Hearing loss due to cerumen impaction, bilateral 07/13/2021   Panlobular emphysema ([place]) 01/07/2021   Neuropathy involving both lower extremities 04/18/2020   Seasonal allergic rhinitis due to pollen 10/24/2018   Essential hypertension 04/25/2018   Laryngopharyngeal reflux (LPR) 02/07/2018   Kidney stone on left side 03/30/2016   IBS (irritable bowel syndrome) 06/27/2015   Coronary artery disease involving native coronary artery of native heart without angina pectoris 06/26/2015   OAB (overactive bladder) 04/16/2015   Atrial flutter ([place])    Hyperlipidemia with target LDL less than 70 01/18/2014   BPH (benign prostatic  hyperplasia) 04/23/2011   Hypothyroidism 04/15/2011   Routine general medical examination at a health care facility 04/15/2011   Atrial fibrillation ([place])    Past Medical History:  Diagnosis Date   Age-related macular degeneration, dry, left eye    Alcohol dependence ([place])    Arthritis    "maybe a little bit in my left knee and right forefinger" (05/30/2015)   Atrial fibrillation ([place])    ablation 05-2015 with no reoccurance as of 09-2015   Basal cell carcinoma    Depression    hx   Family history of adverse reaction to anesthesia    "father had head injuries and dementia; everytime he was put under less of his mentation came back"   Gastroparesis    GERD (gastroesophageal reflux disease)    History of kidney stones    Hypercholesterolemia dx'd 02/2015   Hypertension    Hypothyroidism    Kidney stones    Migraine aura without headache    "very rare now" (05/30/2015)   Pneumonia ~ 2004   Squamous carcinoma    hand, chest   Family History  Problem  Relation Age of Onset   Stroke Brother    Cancer Brother        basosquamous cell carcinoma-head   Heart failure Mother    Colon cancer Cousin 94   Heart disease Neg Hx    Hyperlipidemia Neg Hx    Hypertension Neg Hx    Diabetes Neg Hx    Past Surgical History:  Procedure Laterality Date   ANTERIOR CERVICAL DECOMP/DISCECTOMY FUSION  04/2009   ATRIAL FIBRILLATION ABLATION  05/30/2015   BACK SURGERY     BASAL CELL CARCINOMA EXCISION     chest or back or left arm   CARDIOVERSION  06/2002   CATARACT EXTRACTION W/ INTRAOCULAR LENS IMPLANT Left ~ 2012   COLONOSCOPY     COLONOSCOPY W/ POLYPECTOMY  02/2003   CYSTOSCOPY W/ STONE MANIPULATION  1980's   "basketted it out; no stent"   ELECTROPHYSIOLOGIC STUDY N/A 05/30/2015   Procedure: Atrial Fibrillation Ablation;  Surgeon: Will Meredith Leeds, MD;  Location: [facility];  Service: Cardiovascular;  Laterality: N/A;   EXTRACORPOREAL SHOCK WAVE LITHOTRIPSY Left 04/12/2016   Procedure:  LEFT EXTRACORPOREAL SHOCK WAVE LITHOTRIPSY (ESWL);  Surgeon: Irine Seal, MD;  Location: [facility];  Service: Urology;  Laterality: Left;   INGUINAL HERNIA REPAIR Bilateral 2004   INGUINAL HERNIA REPAIR Bilateral 2004   MOHS SURGERY Right    "temple"   SQUAMOUS CELL CARCINOMA EXCISION     chest or back or left arm   Social History   Occupational History   Occupation: band Pharmacist, hospital    Comment: retired  Tobacco Use   Smoking status: Former    Packs/day: 3.00    Years: 17.00    Total pack years: 51.00    Types: Cigarettes    Quit date: 03/30/1983    Years since quitting: 39.2    Passive exposure: Never   Smokeless tobacco: Never  Vaping Use   Vaping Use: Never used  Substance and Sexual Activity   Alcohol use: No    Alcohol/week: 0.0 standard drinks of alcohol    Comment: 05/30/2015 "recovering alcoholic; dry date [id number]"   Drug use: No    Types: Marijuana, LSD, Mescaline    Comment: 03/07/2015 "stopped all drugs back in the 1980's"   Sexual activity: Not Currently    Birth control/protection: None   I spent 32 minutes in the care of the patient today including face-to-face time, preparation to see the patient, as well as review of previous cervical and ankle x-rays on 03/11/22 and 05/25/22, discussion on cervical isometric HEP, discussion on return to walking program for the above diagnoses.   Elba Barman, DO Primary Care Sports Medicine Physician  [place]  This note was dictated using Dragon naturally speaking software and may contain errors in syntax, spelling, or content which have not been identified prior to signing this note.

## 2022-06-11 ENCOUNTER — Encounter: Payer: Medicare PPO | Admitting: Physical Therapy

## 2022-06-14 ENCOUNTER — Encounter: Payer: Self-pay | Admitting: Physical Therapy

## 2022-06-14 ENCOUNTER — Ambulatory Visit: Payer: Medicare PPO | Admitting: Physical Therapy

## 2022-06-14 DIAGNOSIS — R293 Abnormal posture: Secondary | ICD-10-CM | POA: Diagnosis not present

## 2022-06-14 DIAGNOSIS — M542 Cervicalgia: Secondary | ICD-10-CM | POA: Diagnosis not present

## 2022-06-14 DIAGNOSIS — M6281 Muscle weakness (generalized): Secondary | ICD-10-CM | POA: Diagnosis not present

## 2022-06-14 DIAGNOSIS — M25512 Pain in left shoulder: Secondary | ICD-10-CM

## 2022-06-14 NOTE — Therapy (Signed)
OUTPATIENT PHYSICAL THERAPY TREATMENT NOTE/[employer] reauth/Recert  Referring diagnosis? M54.2 (ICD-10-CM) - Neck pain Treatment diagnosis? (if different than referring diagnosis) M25.512 What was this (referring dx) caused by? [x]  Surgery, previous cervical fusion []  Fall []  Ongoing issue [x]  Arthritis [x]  Other: __muscle strain__________   Laterality: []  Rt [x]  Lt []  Both   Check all possible CPT codes:             *CHOOSE 10 OR LESS*                          []  97110 (Therapeutic Exercise)             []  92507 (SLP Treatment)  []  97112 (Neuro Re-ed)                           []  92526 (Swallowing Treatment)             []  97116 (Gait Training)                           []  [id number] (Cognitive Training, 1st 15 minutes) []  97140 (Manual Therapy)                                []  97130 (Cognitive Training, each add'l 15 minutes)   []  97164 (Re-evaluation)                              []  Other, List CPT Code ____________  []  [id number] (Therapeutic Activities)                                    []  [id number] (Self Care)                      [x]  All codes above (97110 - 97535)            []  97012 (Mechanical Traction)            [x]  97014 (E-stim Unattended)            []  97032 (E-stim manual)            []  97033 (Ionto)            []  97035 (Ultrasound) []  97750 (Physical Performance Training) []  [id number] (Aquatic Therapy) []  97016 (Vasopneumatic Device) []  [id number] (Paraffin) []  97034 (Contrast Bath) []  97597 (Wound Care 1st 20 sq cm) []  97598 (Wound Care each add'l 20 sq cm) []  97760 (Orthotic Fabrication, Fitting, Training Initial) []  [id number] (Prosthetic Management and Training Initial) []  [id number] (Orthotic or Prosthetic Training/ Modification Subsequent)  Patient Name: Roy Long MRN: [id number] DOB:[birth year], 72 y.o., male Today's Date: 06/14/2022  PCP: Janith Lima, MD   REFERRING PROVIDER: Elba Barman, DO   END OF SESSION:   PT End of Session - 06/14/22 1048     Visit Number 9     Number of Visits 15    Date for PT Re-Evaluation 07/12/22    Authorization Type [employer]    Authorization Time Period 12 visits until 3/22    Authorization - Visit Number 9    Authorization - Number of Visits 12    PT Start Time [id number]  PT Stop Time 1100    PT Time Calculation (min) 45 min    Activity Tolerance Patient tolerated treatment well    Behavior During Therapy WFL for tasks assessed/performed                  Past Medical History:  Diagnosis Date   Age-related macular degeneration, dry, left eye    Alcohol dependence ([place])    Arthritis    "maybe a little bit in my left knee and right forefinger" (05/30/2015)   Atrial fibrillation ([place])    ablation 05-2015 with no reoccurance as of 09-2015   Basal cell carcinoma    Depression    hx   Family history of adverse reaction to anesthesia    "father had head injuries and dementia; everytime he was put under less of his mentation came back"   Gastroparesis    GERD (gastroesophageal reflux disease)    History of kidney stones    Hypercholesterolemia dx'd 02/2015   Hypertension    Hypothyroidism    Kidney stones    Migraine aura without headache    "very rare now" (05/30/2015)   Pneumonia ~ 2004   Squamous carcinoma    hand, chest   Past Surgical History:  Procedure Laterality Date   ANTERIOR CERVICAL DECOMP/DISCECTOMY FUSION  04/2009   ATRIAL FIBRILLATION ABLATION  05/30/2015   BACK SURGERY     BASAL CELL CARCINOMA EXCISION     chest or back or left arm   CARDIOVERSION  06/2002   CATARACT EXTRACTION W/ INTRAOCULAR LENS IMPLANT Left ~ 2012   COLONOSCOPY     COLONOSCOPY W/ POLYPECTOMY  02/2003   CYSTOSCOPY W/ STONE MANIPULATION  1980's   "basketted it out; no stent"   ELECTROPHYSIOLOGIC STUDY N/A 05/30/2015   Procedure: Atrial Fibrillation Ablation;  Surgeon: Will Meredith Leeds, MD;  Location: [facility];  Service: Cardiovascular;  Laterality: N/A;   EXTRACORPOREAL SHOCK WAVE LITHOTRIPSY Left 04/12/2016    Procedure: LEFT EXTRACORPOREAL SHOCK WAVE LITHOTRIPSY (ESWL);  Surgeon: Irine Seal, MD;  Location: [facility];  Service: Urology;  Laterality: Left;   INGUINAL HERNIA REPAIR Bilateral 2004   INGUINAL HERNIA REPAIR Bilateral 2004   MOHS SURGERY Right    "temple"   SQUAMOUS CELL CARCINOMA EXCISION     chest or back or left arm   Patient Active Problem List   Diagnosis Date Noted   Moderate episode of recurrent major depressive disorder ([place]) 01/19/2022   Hearing loss due to cerumen impaction, bilateral 07/13/2021   Panlobular emphysema ([place]) 01/07/2021   Neuropathy involving both lower extremities 04/18/2020   Seasonal allergic rhinitis due to pollen 10/24/2018   Essential hypertension 04/25/2018   Laryngopharyngeal reflux (LPR) 02/07/2018   Kidney stone on left side 03/30/2016   IBS (irritable bowel syndrome) 06/27/2015   Coronary artery disease involving native coronary artery of native heart without angina pectoris 06/26/2015   OAB (overactive bladder) 04/16/2015   Atrial flutter ([place])    Hyperlipidemia with target LDL less than 70 01/18/2014   BPH (benign prostatic hyperplasia) 04/23/2011   Hypothyroidism 04/15/2011   Routine general medical examination at a health care facility 04/15/2011   Atrial fibrillation ([facility])     REFERRING DIAG:  M54.2 (ICD-10-CM) - Neck pain  S46.812A (ICD-10-CM) - Trapezius strain, left, initial encounter  M89.9 (ICD-10-CM) - Scapular dysfunction    THERAPY DIAG:  Acute pain of left shoulder  Cervicalgia  Muscle weakness (generalized)  Abnormal posture  Rationale for Evaluation and Treatment  Rehabilitation  PERTINENT HISTORY: cervical 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   PRECAUTIONS: prior cervical fusion  SUBJECTIVE:                                                                                                                                                                                      SUBJECTIVE  STATEMENT:   Pain is better in his ankle but still having the pain in left side of neck and upper trap that sometimes radiates into his scapula, has numbness in his left upper trap today  PAIN:  Are you having pain? Yes, varries, but overall not having the deep neck/shoulder pain he was having but still with pain that gets aggravated with activity   OBJECTIVE: (objective measures completed at initial evaluation unless otherwise dated)   DIAGNOSTIC FINDINGS:  "X-rays demonstrate a prior cervical fusion and from C5-C7. There is grade 1 anterior listhesis of C7 on T1. There is anterior spurring of C4 and notable facet sclerosis from C3-C5. No acute fracture noted. "   PATIENT SURVEYS:  FOTO 62% functional intake at eval   COGNITION: Overall cognitive status: Within functional limits for tasks assessed   SENSATION: WFL   POSTURE: rounded shoulders, has tendency to hike his shoulders   PALPATION: Trigger points and tightness in upper trap             CERVICAL ROM:    Active ROM A/PROM (deg) eval AROM 05/12/22  Flexion 20 35  Extension 40 50  Right lateral flexion 30 33  Left lateral flexion 20 27  Right rotation 50 60  Left rotation 60 65   (Blank rows = not tested)   UPPER EXTREMITY STRENGTH:   MMT Right eval Left eval Bilat 04/07/22  Bilat 05/12/22  Shoulder flexion 4 4 5  4+  Shoulder extension        Shoulder abduction 4 4 5 5   Shoulder adduction        Shoulder extension        Shoulder internal rotation 4 4 5 5   Shoulder external rotation 4 4 5 5    (Blank rows = not tested)   UPPER EXTREMITY ROM   AROM Right eval Left eval  Shoulder flexion WNL WNl  Shoulder extension      Shoulder abduction WNL WNl  Shoulder adduction      Shoulder extension      Shoulder internal rotation WNL WNL  Shoulder external rotation WNL WNL   (Blank rows = not tested)   CERVICAL SPECIAL TESTS:  Spurling's test: Negative     TODAY'S TREATMENT:  06/14/22 Mechanical  cervical traction 13# X  15 min static  Manual: Ischemic compression and Skilled palpation with Trigger Point Dry-Needling  Treatment instructions: Expect mild to moderate muscle soreness. Patient Consent Given: Yes Education handout provided: verbally provided Muscles treated:left upper trap, Lt cervical P.S and Lt multifidi Treatment response/outcome: fair overall tolerance (more sensitivity with this today,twitch response noted   Therex: Cervical retractions X10 Cervical extension SNAG at C4 X 10 Scapular retractions X 10 HEP review and TENS set up review  06/02/22 Mechanical cervical traction 12-7# X 15 min  Manual: Ischemic compression and Skilled palpation with Trigger Point Dry-Needling  Treatment instructions: Expect mild to moderate muscle soreness. Patient Consent Given: Yes Education handout provided: verbally provided Muscles treated:left upper trap Treatment response/outcome: good overall tolerance,twitch response noted   Modalities: Moist heat with pre mod Estim to left neck/shoulder X 15 minutes with intensity 20-23 (with traction)  05/26/22 Mechanical cervical traction 12-7# X 15 min Manual: Ischemic compression and Skilled palpation with Trigger Point Dry-Needling  Treatment instructions: Expect mild to moderate muscle soreness. Patient Consent Given: Yes Education handout provided: verbally provided Muscles treated:left upper trap,cervical P.S, multifidi, supraspinatus Treatment response/outcome: good overall tolerance,twitch response noted   Modalities: Moist heat with pre mod Estim to left neck/shoulder X 15 minutes with intensity 13-21      PATIENT EDUCATION: Education details: HEP, PT plan of care, theracane Person educated: Patient Education method: Explanation, Demonstration, Verbal cues, and Handouts Education comprehension: verbalized understanding and needs further education     HOME EXERCISE PROGRAM: Access Code: [email or web address] URL:  [email or web address] Date: 06/14/2022 Prepared by: Elsie Ra  Exercises - Seated Passive Cervical Retraction  - 5 x daily - 6 x weekly - 1-2 sets - 10 reps - Mid-Lower Cervical Extension SNAG with Strap  - 5 x daily - 6 x weekly - 1-2 sets - 10 reps - Standing Scapular Retraction  - 5 x daily - 6 x weekly - 1-2 sets - 10 reps - 5 sec hold - Seated Upper Trap Stretch (Mirrored)  - 2-3 x daily - 6 x weekly - 1 sets - 3 reps - 20 sec hold - Seated Levator Scapulae Stretch  - 2-3 x daily - 6 x weekly - 3 reps - 20 sec hold - Standing Single Arm Shoulder Abduction with Resistance  - 1 x daily - 2-3 x weekly - 3 sets - 10 reps - Standing Shoulder Flexion with Resistance  - 1 x daily - 2-3 x weekly - 3 sets - 10 reps - Standing Shoulder External Rotation with Resistance  - 1 x daily - 2-3 x weekly - 1-3 sets - 10 reps - Standing Shoulder Horizontal Abduction with Resistance  - 1 x daily - 2-3 x weekly - 3 sets - 10 reps  Patient Education - Acute Neck Pain Handout ASSESSMENT:   CLINICAL IMPRESSION His pain levels continue to be up and down and get re-aggravated with activity. He had more radicular symptoms today and physical exam suggests this is more C4 pathology in nature. I attempted spinal mobs at this segement, continued with light traction and DN, and printed him out new HEP for cervical retractions and extension SNAG to address any disc pathology at this level. I recommend PT for up to another 4 weeks and if he does not see significant improvement we will refer back to MD for MRI or further assessment.   OBJECTIVE IMPAIRMENTS: decreased activity tolerance, decreased shoulder mobility, decreased ROM, decreased strength, impaired flexibility, impaired UE use, postural dysfunction, and pain.  ACTIVITY LIMITATIONS: reaching, lifting, carry,  cleaning, driving, and or occupation   PERSONAL FACTORS: cervical spondylosis with a prior fusion at the C5-C7 level.  also affecting  patient's functional outcome.   REHAB POTENTIAL: Good   CLINICAL DECISION MAKING: Stable/uncomplicated   EVALUATION COMPLEXITY: Low       GOALS: Short term PT Goals Target date: 04/14/2022     Pt will be I and compliant with HEP. Baseline:  Goal status: partially MET, it was met but then he was unable to do them all due to COVID 05/12/22 Pt will decrease pain by 25% overall Baseline: Goal status: MET 05/12/22   Long term PT goals Target date:06/18/2022     Pt will improve neck AROM to [facility] to improve functional scanning Baseline: Goal status: ongoing, see updated measurmemts 05/12/22 Pt will improve  Rt shoulder strength to at least 4+/5 MMT to improve functional strength Baseline: Goal status: MET 05/12/22 Pt will improve FOTO to at least 70% functional to show improved function Baseline: Goal status: ongoing 06/14/22 Pt will reduce pain to overall less than 3/10 with usual activity and gym activity Baseline: Goal status: ongoing 06/14/22   PLAN: PT FREQUENCY: 1 times per week    PT DURATION: 6 weeks   PLANNED INTERVENTIONS (unless contraindicated): aquatic PT, Canalith repositioning, cryotherapy, Electrical stimulation, Iontophoresis with 4 mg/ml dexamethasome, Moist heat, traction, Ultrasound, gait training, Therapeutic exercise, balance training, neuromuscular re-education, patient/family education, prosthetic training, manual techniques, passive ROM, dry needling, taping, vasopnuematic device, vestibular, spinal manipulations, joint manipulations   PLAN FOR NEXT SESSION: continue cervical mckenzie program,traction, DN, left shoulder/neck mobility and shoulder/UE strength as tolerated.        Debbe Odea, PT, DPT 06/14/2022, 11:17 AM

## 2022-06-24 ENCOUNTER — Encounter: Payer: Self-pay | Admitting: Physical Therapy

## 2022-06-24 ENCOUNTER — Ambulatory Visit: Payer: Medicare PPO | Admitting: Physical Therapy

## 2022-06-24 DIAGNOSIS — M6281 Muscle weakness (generalized): Secondary | ICD-10-CM | POA: Diagnosis not present

## 2022-06-24 DIAGNOSIS — R293 Abnormal posture: Secondary | ICD-10-CM

## 2022-06-24 DIAGNOSIS — M542 Cervicalgia: Secondary | ICD-10-CM

## 2022-06-24 DIAGNOSIS — M25512 Pain in left shoulder: Secondary | ICD-10-CM | POA: Diagnosis not present

## 2022-06-24 NOTE — Therapy (Signed)
OUTPATIENT PHYSICAL THERAPY TREATMENT NOTE Progress Note reporting period date 02/2023 to 06/24/22  See below for objective and subjective measurements relating to patients progress with PT.  Referring diagnosis? M54.2 (ICD-10-CM) - Neck pain Treatment diagnosis? (if different than referring diagnosis) M25.512 What was this (referring dx) caused by? [x]  Surgery, previous cervical fusion []  Fall []  Ongoing issue [x]  Arthritis [x]  Other: __muscle strain__________   Laterality: []  Rt [x]  Lt []  Both   Check all possible CPT codes:             *CHOOSE 10 OR LESS*                          []  97110 (Therapeutic Exercise)             []  92507 (SLP Treatment)  []  97112 (Neuro Re-ed)                           []  92526 (Swallowing Treatment)             []  97116 (Gait Training)                           []  [id number] (Cognitive Training, 1st 15 minutes) []  97140 (Manual Therapy)                                []  97130 (Cognitive Training, each add'l 15 minutes)   []  97164 (Re-evaluation)                              []  Other, List CPT Code ____________  []  [id number] (Therapeutic Activities)                                    []  [id number] (Self Care)                      [x]  All codes above (97110 - 97535)            []  97012 (Mechanical Traction)            [x]  97014 (E-stim Unattended)            []  97032 (E-stim manual)            []  97033 (Ionto)            []  97035 (Ultrasound) []  97750 (Physical Performance Training) []  [id number] (Aquatic Therapy) []  97016 (Vasopneumatic Device) []  [id number] (Paraffin) []  97034 (Contrast Bath) []  97597 (Wound Care 1st 20 sq cm) []  97598 (Wound Care each add'l 20 sq cm) []  97760 (Orthotic Fabrication, Fitting, Training Initial) []  [id number] (Prosthetic Management and Training Initial) []  [id number] (Orthotic or Prosthetic Training/ Modification Subsequent)  Patient Name: Roy Long MRN: [id number] DOB:Oct 26, 1950, 72 y.o., male Today's Date: 06/24/2022  PCP: Janith Lima, MD   REFERRING PROVIDER: Elba Barman, DO   END OF SESSION:   PT End of Session - 06/24/22 1013     Visit Number 10    Number of Visits 15    Date for PT Re-Evaluation 07/12/22    Authorization Type [employer]    Authorization Time Period 12 visits until 3/22    Authorization -  Visit Number 10    Authorization - Number of Visits 12    Progress Note Due on Visit 19    PT Start Time [id number]    PT Stop Time 1100    PT Time Calculation (min) 45 min    Activity Tolerance Patient tolerated treatment well    Behavior During Therapy WFL for tasks assessed/performed                  Past Medical History:  Diagnosis Date   Age-related macular degeneration, dry, left eye    Alcohol dependence ([place])    Arthritis    "maybe a little bit in my left knee and right forefinger" (05/30/2015)   Atrial fibrillation ([place])    ablation 05-2015 with no reoccurance as of 09-2015   Basal cell carcinoma    Depression    hx   Family history of adverse reaction to anesthesia    "father had head injuries and dementia; everytime he was put under less of his mentation came back"   Gastroparesis    GERD (gastroesophageal reflux disease)    History of kidney stones    Hypercholesterolemia dx'd 02/2015   Hypertension    Hypothyroidism    Kidney stones    Migraine aura without headache    "very rare now" (05/30/2015)   Pneumonia ~ 2004   Squamous carcinoma    hand, chest   Past Surgical History:  Procedure Laterality Date   ANTERIOR CERVICAL DECOMP/DISCECTOMY FUSION  04/2009   ATRIAL FIBRILLATION ABLATION  05/30/2015   BACK SURGERY     BASAL CELL CARCINOMA EXCISION     chest or back or left arm   CARDIOVERSION  06/2002   CATARACT EXTRACTION W/ INTRAOCULAR LENS IMPLANT Left ~ 2012   COLONOSCOPY     COLONOSCOPY W/ POLYPECTOMY  02/2003   CYSTOSCOPY W/ STONE MANIPULATION  1980's   "basketted it out; no stent"   ELECTROPHYSIOLOGIC STUDY N/A 05/30/2015   Procedure: Atrial Fibrillation Ablation;   Surgeon: Will Meredith Leeds, MD;  Location: [facility];  Service: Cardiovascular;  Laterality: N/A;   EXTRACORPOREAL SHOCK WAVE LITHOTRIPSY Left 04/12/2016   Procedure: LEFT EXTRACORPOREAL SHOCK WAVE LITHOTRIPSY (ESWL);  Surgeon: Irine Seal, MD;  Location: [facility];  Service: Urology;  Laterality: Left;   INGUINAL HERNIA REPAIR Bilateral 2004   INGUINAL HERNIA REPAIR Bilateral 2004   MOHS SURGERY Right    "temple"   SQUAMOUS CELL CARCINOMA EXCISION     chest or back or left arm   Patient Active Problem List   Diagnosis Date Noted   Moderate episode of recurrent major depressive disorder ([place]) 01/19/2022   Hearing loss due to cerumen impaction, bilateral 07/13/2021   Panlobular emphysema ([place]) 01/07/2021   Neuropathy involving both lower extremities 04/18/2020   Seasonal allergic rhinitis due to pollen 10/24/2018   Essential hypertension 04/25/2018   Laryngopharyngeal reflux (LPR) 02/07/2018   Kidney stone on left side 03/30/2016   IBS (irritable bowel syndrome) 06/27/2015   Coronary artery disease involving native coronary artery of native heart without angina pectoris 06/26/2015   OAB (overactive bladder) 04/16/2015   Atrial flutter ([place])    Hyperlipidemia with target LDL less than 70 01/18/2014   BPH (benign prostatic hyperplasia) 04/23/2011   Hypothyroidism 04/15/2011   Routine general medical examination at a health care facility 04/15/2011   Atrial fibrillation ([place])     REFERRING DIAG:  M54.2 (ICD-10-CM) - Neck pain  S46.812A (ICD-10-CM) - Trapezius strain, left, initial encounter  M89.9 (ICD-10-CM) - Scapular dysfunction    THERAPY DIAG:  Acute pain of left shoulder  Cervicalgia  Muscle weakness (generalized)  Abnormal posture  Rationale for Evaluation and Treatment Rehabilitation  PERTINENT HISTORY: cervical 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   PRECAUTIONS: prior cervical fusion  SUBJECTIVE:                                                                                                                                                                                       SUBJECTIVE STATEMENT:   He relays he felt good for a few days after last session but then did yardwork and aggravated his pain again.  PAIN:  Are you having pain? Yes, varries, but overall not having the deep neck/shoulder pain he was having but still with pain that gets aggravated with activity   OBJECTIVE: (objective measures completed at initial evaluation unless otherwise dated)   DIAGNOSTIC FINDINGS:  "X-rays demonstrate a prior cervical fusion and from C5-C7. There is grade 1 anterior listhesis of C7 on T1. There is anterior spurring of C4 and notable facet sclerosis from C3-C5. No acute fracture noted. "   PATIENT SURVEYS:  FOTO 62% functional intake at eval   COGNITION: Overall cognitive status: Within functional limits for tasks assessed   SENSATION: WFL   POSTURE: rounded shoulders, has tendency to hike his shoulders   PALPATION: Trigger points and tightness in upper trap             CERVICAL ROM:    Active ROM A/PROM (deg) eval AROM 05/12/22  Flexion 20 35  Extension 40 50  Right lateral flexion 30 33  Left lateral flexion 20 27  Right rotation 50 60  Left rotation 60 65   (Blank rows = not tested)   UPPER EXTREMITY STRENGTH:   MMT Right eval Left eval Bilat 04/07/22  Bilat 05/12/22 06/24/22 L/R  Shoulder flexion 4 4 5  4+ 4+/5  Shoulder extension         Shoulder abduction 4 4 5 5 5   Shoulder adduction         Shoulder extension         Shoulder internal rotation 4 4 5 5 5   Shoulder external rotation 4 4 5 5 5    (Blank rows = not tested)   UPPER EXTREMITY ROM   AROM Right eval Left eval  Shoulder flexion WNL WNl  Shoulder extension      Shoulder abduction WNL WNl  Shoulder adduction      Shoulder extension      Shoulder internal rotation WNL WNL  Shoulder external rotation WNL WNL   (  Blank  rows = not tested)   CERVICAL SPECIAL TESTS:  Spurling's test: Negative     TODAY'S TREATMENT:  06/24/22 Nu step L 6-5 X 8 min UE/LE Chest press machine 10# 3X10 Row machine 25# 3X10 Lat pull machine 20# 3X10 Lt shoulder ER red 2X10 Lt shoulder IR red 3 X10 Standing shoulder abd red X 10 HEP review for machines he can perform at gym  06/14/22 Mechanical cervical traction 13# X 15 min static  Manual: Ischemic compression and Skilled palpation with Trigger Point Dry-Needling  Treatment instructions: Expect mild to moderate muscle soreness. Patient Consent Given: Yes Education handout provided: verbally provided Muscles treated:left upper trap, Lt cervical P.S and Lt multifidi Treatment response/outcome: fair overall tolerance (more sensitivity with this today,twitch response noted   Therex: Cervical retractions X10 Cervical extension SNAG at C4 X 10 Scapular retractions X 10 HEP review and TENS set up review  06/02/22 Mechanical cervical traction 12-7# X 15 min  Manual: Ischemic compression and Skilled palpation with Trigger Point Dry-Needling  Treatment instructions: Expect mild to moderate muscle soreness. Patient Consent Given: Yes Education handout provided: verbally provided Muscles treated:left upper trap Treatment response/outcome: good overall tolerance,twitch response noted   Modalities: Moist heat with pre mod Estim to left neck/shoulder X 15 minutes with intensity 20-23 (with traction)  05/26/22 Mechanical cervical traction 12-7# X 15 min Manual: Ischemic compression and Skilled palpation with Trigger Point Dry-Needling  Treatment instructions: Expect mild to moderate muscle soreness. Patient Consent Given: Yes Education handout provided: verbally provided Muscles treated:left upper trap,cervical P.S, multifidi, supraspinatus Treatment response/outcome: good overall tolerance,twitch response noted   Modalities: Moist heat with pre mod Estim to left  neck/shoulder X 15 minutes with intensity 13-21      PATIENT EDUCATION: Education details: HEP, PT plan of care, theracane Person educated: Patient Education method: Explanation, Demonstration, Verbal cues, and Handouts Education comprehension: verbalized understanding and needs further education     HOME EXERCISE PROGRAM: Access Code: [email or web address] URL: [email or web address] Date: 06/14/2022 Prepared by: Elsie Ra  Exercises - Seated Passive Cervical Retraction  - 5 x daily - 6 x weekly - 1-2 sets - 10 reps - Mid-Lower Cervical Extension SNAG with Strap  - 5 x daily - 6 x weekly - 1-2 sets - 10 reps - Standing Scapular Retraction  - 5 x daily - 6 x weekly - 1-2 sets - 10 reps - 5 sec hold - Seated Upper Trap Stretch (Mirrored)  - 2-3 x daily - 6 x weekly - 1 sets - 3 reps - 20 sec hold - Seated Levator Scapulae Stretch  - 2-3 x daily - 6 x weekly - 3 reps - 20 sec hold - Standing Single Arm Shoulder Abduction with Resistance  - 1 x daily - 2-3 x weekly - 3 sets - 10 reps - Standing Shoulder Flexion with Resistance  - 1 x daily - 2-3 x weekly - 3 sets - 10 reps - Standing Shoulder External Rotation with Resistance  - 1 x daily - 2-3 x weekly - 1-3 sets - 10 reps - Standing Shoulder Horizontal Abduction with Resistance  - 1 x daily - 2-3 x weekly - 3 sets - 10 reps  Patient Education - Acute Neck Pain Handout ASSESSMENT:   CLINICAL IMPRESSION His shoulder pain continues to be aggravated with ADL's such as housework and yardwork. I did progress his strength program and show him gym routine in efforts to improve overall shoulder strength/stability to see if this helps with his  ADL's any.  if he does not see significant improvement next week we will refer back to MD for MRI or further assessment.   OBJECTIVE IMPAIRMENTS: decreased activity tolerance, decreased shoulder mobility, decreased ROM, decreased strength, impaired flexibility, impaired UE use, postural dysfunction,  and pain.   ACTIVITY LIMITATIONS: reaching, lifting, carry,  cleaning, driving, and or occupation   PERSONAL FACTORS: cervical spondylosis with a prior fusion at the C5-C7 level.  also affecting patient's functional outcome.   REHAB POTENTIAL: Good   CLINICAL DECISION MAKING: Stable/uncomplicated   EVALUATION COMPLEXITY: Low       GOALS: Short term PT Goals Target date: 04/14/2022     Pt will be I and compliant with HEP. Baseline:  Goal status: partially MET, it was met but then he was unable to do them all due to COVID 05/12/22 Pt will decrease pain by 25% overall Baseline: Goal status: MET 05/12/22   Long term PT goals Target date:06/18/2022     Pt will improve neck AROM to [facility] to improve functional scanning Baseline: Goal status: ongoing, see updated measurmemts 05/12/22 Pt will improve  Rt shoulder strength to at least 4+/5 MMT to improve functional strength Baseline: Goal status: MET 05/12/22 Pt will improve FOTO to at least 70% functional to show improved function Baseline: Goal status: ongoing 06/14/22 Pt will reduce pain to overall less than 3/10 with usual activity and gym activity Baseline: Goal status: ongoing 06/14/22   PLAN: PT FREQUENCY: 1 times per week    PT DURATION: 6 weeks   PLANNED INTERVENTIONS (unless contraindicated): aquatic PT, Canalith repositioning, cryotherapy, Electrical stimulation, Iontophoresis with 4 mg/ml dexamethasome, Moist heat, traction, Ultrasound, gait training, Therapeutic exercise, balance training, neuromuscular re-education, patient/family education, prosthetic training, manual techniques, passive ROM, dry needling, taping, vasopnuematic device, vestibular, spinal manipulations, joint manipulations   PLAN FOR NEXT SESSION: continue cervical mckenzie program,traction, DN, left shoulder/neck mobility and shoulder/UE strength as tolerated.        Debbe Odea, PT, DPT 06/24/2022, 10:13 AM

## 2022-06-28 ENCOUNTER — Other Ambulatory Visit: Payer: Self-pay | Admitting: Internal Medicine

## 2022-06-28 DIAGNOSIS — K219 Gastro-esophageal reflux disease without esophagitis: Secondary | ICD-10-CM

## 2022-06-28 DIAGNOSIS — E039 Hypothyroidism, unspecified: Secondary | ICD-10-CM

## 2022-06-29 ENCOUNTER — Ambulatory Visit: Payer: Medicare PPO | Admitting: Physical Therapy

## 2022-06-29 ENCOUNTER — Encounter: Payer: Self-pay | Admitting: Physical Therapy

## 2022-06-29 DIAGNOSIS — R293 Abnormal posture: Secondary | ICD-10-CM | POA: Diagnosis not present

## 2022-06-29 DIAGNOSIS — M542 Cervicalgia: Secondary | ICD-10-CM | POA: Diagnosis not present

## 2022-06-29 DIAGNOSIS — M25512 Pain in left shoulder: Secondary | ICD-10-CM

## 2022-06-29 DIAGNOSIS — M6281 Muscle weakness (generalized): Secondary | ICD-10-CM

## 2022-06-29 NOTE — Therapy (Addendum)
OUTPATIENT PHYSICAL THERAPY TREATMENT NOTE /DISCHARGE   Referring diagnosis? M54.2 (ICD-10-CM) - Neck pain Treatment diagnosis? (if different than referring diagnosis) M25.512 What was this (referring dx) caused by? [x]  Surgery, previous cervical fusion []  Fall []  Ongoing issue [x]  Arthritis [x]  Other: __muscle strain__________   Laterality: []  Rt [x]  Lt []  Both   Check all possible CPT codes:             *CHOOSE 10 OR LESS*                          []  97110 (Therapeutic Exercise)             []  92507 (SLP Treatment)  []  97112 (Neuro Re-ed)                           []  92526 (Swallowing Treatment)             []  97116 (Gait Training)                           []  [id number] (Cognitive Training, 1st 15 minutes) []  97140 (Manual Therapy)                                []  97130 (Cognitive Training, each add'l 15 minutes)   []  97164 (Re-evaluation)                              []  Other, List CPT Code ____________  []  97530 (Therapeutic Activities)                                    []  97535 (Self Care)                      [x]  All codes above (97110 - 97535)            []  97012 (Mechanical Traction)            [x]  97014 (E-stim Unattended)            []  97032 (E-stim manual)            []  16109 (Ionto)            []  97035 (Ultrasound) []  97750 (Physical Performance Training) []  [id number] (Aquatic Therapy) []  97016 (Vasopneumatic Device) []  [id number] (Paraffin) []  97034 (Contrast Bath) []  97597 (Wound Care 1st 20 sq cm) []  97598 (Wound Care each add'l 20 sq cm) []  97760 (Orthotic Fabrication, Fitting, Training Initial) []  [id number] (Prosthetic Management and Training Initial) []  [id number] (Orthotic or Prosthetic Training/ Modification Subsequent)  Patient Name: Roy Long MRN: [id number] DOB:[birth year], 72 y.o., male Today's Date: 06/29/2022  PCP: Etta Grandchild, MD   REFERRING PROVIDER: Madelyn Brunner, DO   END OF SESSION:   PT End of Session - 06/29/22 1014     Visit Number 11    Number  of Visits 15    Date for PT Re-Evaluation 07/12/22    Authorization Type [employer]    Authorization Time Period 12 visits until 3/22    Authorization - Number of Visits 12    Progress Note Due on Visit 20    PT Start Time 1015  PT Stop Time 1100    PT Time Calculation (min) 45 min    Activity Tolerance Patient tolerated treatment well    Behavior During Therapy WFL for tasks assessed/performed                  Past Medical History:  Diagnosis Date   Age-related macular degeneration, dry, left eye    Alcohol dependence    Arthritis    "maybe a little bit in my left knee and right forefinger" (05/30/2015)   Atrial fibrillation    ablation 05-2015 with no reoccurance as of 09-2015   Basal cell carcinoma    Depression    hx   Family history of adverse reaction to anesthesia    "father had head injuries and dementia; everytime he was put under less of his mentation came back"   Gastroparesis    GERD (gastroesophageal reflux disease)    History of kidney stones    Hypercholesterolemia dx'd 02/2015   Hypertension    Hypothyroidism    Kidney stones    Migraine aura without headache    "very rare now" (05/30/2015)   Pneumonia ~ 2004   Squamous carcinoma    hand, chest   Past Surgical History:  Procedure Laterality Date   ANTERIOR CERVICAL DECOMP/DISCECTOMY FUSION  04/2009   ATRIAL FIBRILLATION ABLATION  05/30/2015   BACK SURGERY     BASAL CELL CARCINOMA EXCISION     chest or back or left arm   CARDIOVERSION  06/2002   CATARACT EXTRACTION W/ INTRAOCULAR LENS IMPLANT Left ~ 2012   COLONOSCOPY     COLONOSCOPY W/ POLYPECTOMY  02/2003   CYSTOSCOPY W/ STONE MANIPULATION  1980's   "basketted it out; no stent"   ELECTROPHYSIOLOGIC STUDY N/A 05/30/2015   Procedure: Atrial Fibrillation Ablation;  Surgeon: Will Jorja Loa, MD;  Location: [facility];  Service: Cardiovascular;  Laterality: N/A;   EXTRACORPOREAL SHOCK WAVE LITHOTRIPSY Left 04/12/2016   Procedure: LEFT  EXTRACORPOREAL SHOCK WAVE LITHOTRIPSY (ESWL);  Surgeon: Bjorn Pippin, MD;  Location: [facility];  Service: Urology;  Laterality: Left;   INGUINAL HERNIA REPAIR Bilateral 2004   INGUINAL HERNIA REPAIR Bilateral 2004   MOHS SURGERY Right    "temple"   SQUAMOUS CELL CARCINOMA EXCISION     chest or back or left arm   Patient Active Problem List   Diagnosis Date Noted   Moderate episode of recurrent major depressive disorder 01/19/2022   Hearing loss due to cerumen impaction, bilateral 07/13/2021   Panlobular emphysema 01/07/2021   Neuropathy involving both lower extremities 04/18/2020   Seasonal allergic rhinitis due to pollen 10/24/2018   Essential hypertension 04/25/2018   Laryngopharyngeal reflux (LPR) 02/07/2018   Kidney stone on left side 03/30/2016   IBS (irritable bowel syndrome) 06/27/2015   Coronary artery disease involving native coronary artery of native heart without angina pectoris 06/26/2015   OAB (overactive bladder) 04/16/2015   Atrial flutter    Hyperlipidemia with target LDL less than 70 01/18/2014   BPH (benign prostatic hyperplasia) 04/23/2011   Hypothyroidism 04/15/2011   Routine general medical examination at a health care facility 04/15/2011   Atrial fibrillation (HCC)     REFERRING DIAG:  M54.2 (ICD-10-CM) - Neck pain  S46.812A (ICD-10-CM) - Trapezius strain, left, initial encounter  M89.9 (ICD-10-CM) - Scapular dysfunction    THERAPY DIAG:  Acute pain of left shoulder  Cervicalgia  Muscle weakness (generalized)  Abnormal posture  Rationale for Evaluation and Treatment Rehabilitation  PERTINENT HISTORY: cervical  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   PRECAUTIONS: prior cervical fusion  SUBJECTIVE:                                                                                                                                                                                      SUBJECTIVE STATEMENT:   He relays he feels good  for a few days then his pain returns in left neck/shoulder.   PAIN:  Are you having pain? Yes, varries, but overall not having the deep neck/shoulder pain he was having but still with pain that gets aggravated with activity   OBJECTIVE: (objective measures completed at initial evaluation unless otherwise dated)   DIAGNOSTIC FINDINGS:  "X-rays demonstrate a prior cervical fusion and from C5-C7. There is grade 1 anterior listhesis of C7 on T1. There is anterior spurring of C4 and notable facet sclerosis from C3-C5. No acute fracture noted. "   PATIENT SURVEYS:  FOTO 62% functional intake at eval   COGNITION: Overall cognitive status: Within functional limits for tasks assessed   SENSATION: WFL   POSTURE: rounded shoulders, has tendency to hike his shoulders   PALPATION: Trigger points and tightness in upper trap             CERVICAL ROM:    Active ROM A/PROM (deg) eval AROM 05/12/22  Flexion 20 35  Extension 40 50  Right lateral flexion 30 33  Left lateral flexion 20 27  Right rotation 50 60  Left rotation 60 65   (Blank rows = not tested)   UPPER EXTREMITY STRENGTH:   MMT Right eval Left eval Bilat 04/07/22  Bilat 05/12/22 06/24/22 L/R  Shoulder flexion 4 4 5  4+ 4+/5  Shoulder extension         Shoulder abduction 4 4 5 5 5   Shoulder adduction         Shoulder extension         Shoulder internal rotation 4 4 5 5 5   Shoulder external rotation 4 4 5 5 5    (Blank rows = not tested)   UPPER EXTREMITY ROM   AROM Right eval Left eval  Shoulder flexion WNL WNl  Shoulder extension      Shoulder abduction WNL WNl  Shoulder adduction      Shoulder extension      Shoulder internal rotation WNL WNL  Shoulder external rotation WNL WNL   (Blank rows = not tested)   CERVICAL SPECIAL TESTS:  Spurling's test: Negative     TODAY'S TREATMENT:  06/29/22 UBE L3, 3 min fwd, 3 min retro Chest press machine 10# 3X10 Row machine 25# 3X10 Lat  pull machine 20#  3X10 Manual therapy for skilled palpation and Trigger Point Dry-Needling  Treatment instructions: Expect mild to moderate muscle soreness. Patient Consent Given: Yes Education handout provided: verbally provided Muscles treated: Lt upper trap, supraspinatus, infraspinatus Treatment response/outcome: good overall tolerance,twitch response noted   HEP review   06/24/22 Nu step L 6-5 X 8 min UE/LE Chest press machine 10# 3X10 Row machine 25# 3X10 Lat pull machine 20# 3X10 Lt shoulder ER red 2X10 Lt shoulder IR red 3 X10 Standing shoulder abd red X 10 HEP review for machines he can perform at gym  06/14/22 Mechanical cervical traction 13# X 15 min static  Manual: Ischemic compression and Skilled palpation with Trigger Point Dry-Needling  Treatment instructions: Expect mild to moderate muscle soreness. Patient Consent Given: Yes Education handout provided: verbally provided Muscles treated:left upper trap, Lt cervical P.S and Lt multifidi Treatment response/outcome: fair overall tolerance (more sensitivity with this today,twitch response noted   Therex: Cervical retractions X10 Cervical extension SNAG at C4 X 10 Scapular retractions X 10 HEP review and TENS set up review       PATIENT EDUCATION: Education details: HEP, PT plan of care, theracane Person educated: Patient Education method: Explanation, Demonstration, Verbal cues, and Handouts Education comprehension: verbalized understanding and needs further education     HOME EXERCISE PROGRAM: Access Code: [email or web address] URL: [email or web address] Date: 06/14/2022 Prepared by: Ivery Quale  Exercises - Seated Passive Cervical Retraction  - 5 x daily - 6 x weekly - 1-2 sets - 10 reps - Mid-Lower Cervical Extension SNAG with Strap  - 5 x daily - 6 x weekly - 1-2 sets - 10 reps - Standing Scapular Retraction  - 5 x daily - 6 x weekly - 1-2 sets - 10 reps - 5 sec hold - Seated Upper Trap Stretch (Mirrored)  - 2-3  x daily - 6 x weekly - 1 sets - 3 reps - 20 sec hold - Seated Levator Scapulae Stretch  - 2-3 x daily - 6 x weekly - 3 reps - 20 sec hold - Standing Single Arm Shoulder Abduction with Resistance  - 1 x daily - 2-3 x weekly - 3 sets - 10 reps - Standing Shoulder Flexion with Resistance  - 1 x daily - 2-3 x weekly - 3 sets - 10 reps - Standing Shoulder External Rotation with Resistance  - 1 x daily - 2-3 x weekly - 1-3 sets - 10 reps - Standing Shoulder Horizontal Abduction with Resistance  - 1 x daily - 2-3 x weekly - 3 sets - 10 reps  Patient Education - Acute Neck Pain Handout ASSESSMENT:   CLINICAL IMPRESSION We discussed recommendation to return to MD for follow up and further diagnostics as he does have signs of RTC pathology that has not fully improved with PT. He agrees to this plan. He will continue HEP as long as it does not cause pain. Diffirential diagnosis could be cervical radiculopathy however his N/T is no longer that common and does not last that long.    OBJECTIVE IMPAIRMENTS: decreased activity tolerance, decreased shoulder mobility, decreased ROM, decreased strength, impaired flexibility, impaired UE use, postural dysfunction, and pain.   ACTIVITY LIMITATIONS: reaching, lifting, carry,  cleaning, driving, and or occupation   PERSONAL FACTORS: cervical spondylosis with a prior fusion at the C5-C7 level.  also affecting patient's functional outcome.   REHAB POTENTIAL: Good   CLINICAL DECISION MAKING: Stable/uncomplicated   EVALUATION COMPLEXITY: Low       GOALS: Short  term PT Goals Target date: 04/14/2022     Pt will be I and compliant with HEP. Baseline:  Goal status: partially MET, it was met but then he was unable to do them all due to COVID 05/12/22 Pt will decrease pain by 25% overall Baseline: Goal status: MET 05/12/22   Long term PT goals Target date:06/18/2022     Pt will improve neck AROM to [facility] to improve functional scanning Baseline: Goal status:  ongoing, see updated measurmemts 05/12/22 Pt will improve  Rt shoulder strength to at least 4+/5 MMT to improve functional strength Baseline: Goal status: MET 05/12/22 Pt will improve FOTO to at least 70% functional to show improved function Baseline: Goal status: ongoing 06/14/22 Pt will reduce pain to overall less than 3/10 with usual activity and gym activity Baseline: Goal status: ongoing 06/14/22   PLAN: PT FREQUENCY: 1 times per week    PT DURATION: 6 weeks   PLANNED INTERVENTIONS (unless contraindicated): aquatic PT, Canalith repositioning, cryotherapy, Electrical stimulation, Iontophoresis with 4 mg/ml dexamethasome, Moist heat, traction, Ultrasound, gait training, Therapeutic exercise, balance training, neuromuscular re-education, patient/family education, prosthetic training, manual techniques, passive ROM, dry needling, taping, vasopnuematic device, vestibular, spinal manipulations, joint manipulations   PLAN FOR NEXT SESSION: hold PT and refer back to MD, would recommend MRI       April Manson, PT, DPT 06/29/2022, 10:15 AM   PHYSICAL THERAPY DISCHARGE SUMMARY  Visits from Start of Care: 11  Current functional level related to goals / functional outcomes: See note   Remaining deficits: See note   Education / Equipment: HEP  Patient goals were partially met. Patient is being discharged due to not returning since the last visit.   Chyrel Masson, PT, DPT, OCS, ATC 08/02/22  2:13 PM

## 2022-07-01 ENCOUNTER — Ambulatory Visit: Payer: Medicare PPO | Admitting: Sports Medicine

## 2022-07-01 ENCOUNTER — Encounter: Payer: Self-pay | Admitting: Sports Medicine

## 2022-07-01 DIAGNOSIS — Z981 Arthrodesis status: Secondary | ICD-10-CM

## 2022-07-01 DIAGNOSIS — M25571 Pain in right ankle and joints of right foot: Secondary | ICD-10-CM

## 2022-07-01 DIAGNOSIS — M5412 Radiculopathy, cervical region: Secondary | ICD-10-CM

## 2022-07-01 NOTE — Progress Notes (Signed)
Roy Long - 72 y.o. male MRN [id number]  Date of birth: 11/15/1950  Office Visit Note: Visit Date: 07/01/2022 PCP: Janith Lima, MD Referred by: Janith Lima, MD  Subjective: Chief Complaint  Patient presents with   Right Ankle - Pain   Neck - Pain   Left Shoulder - Pain   HPI: Roy Long is a pleasant 72 y.o. male who presents today for follow-up of neck pain, right ankle pain.  Right ankle - continues to be markedly improved.  Still has very minimal pain with activity, but not limiting him.  Back to walking his dog.  Neck pain - this continues for him.  Pain over the left side of the neck that will radiate to the lateral aspect of the shoulder and trapezius.  He has gotten good relief from trigger point injections, physical therapy, dry needling, however his pain always returns.  Pain starts in the neck and will radiate down.  He denies any numbness or tingling going down past the elbows or into the hands.   Pertinent ROS were reviewed with the patient and found to be negative unless otherwise specified above in HPI.   Assessment & Plan: Visit Diagnoses:  1. History of fusion of cervical spine   2. Radiculopathy, cervical region   3. Pain in right ankle and joints of right foot    Plan: Discussed with [name] the nature of his chronic neck and left shoulder pain.  His shoulder exam is rather benign today.  He still has some tightness in the trapezius, but has symptoms concerning for cervical radiculopathy.  He has gotten good improvement from PT, dry needling and trigger point injections but his pain returns.  At this point, discussed we need to obtain an MRI of the cervical spine to rule out adjacent segment disease with associated radiculopathy.  His symptoms do fit a C4 or C5 left radiculopathy.  He will continue his home exercises and PT in the meantime.  In terms of his right ankle, I would like him to work on balance and proprioception exercises to strengthen the  lateral and medial ligaments of the ankle.  He may ice after activity.  May use Tylenol.  I will give him a call or message him after his MRI results to discuss next steps.  Follow-up: Return for Will call/message after MRI results.   Meds & Orders: No orders of the defined types were placed in this encounter.   Orders Placed This Encounter  Procedures   MR Cervical Spine w/o contrast     Procedures: No procedures performed      Clinical History: No specialty comments available.  He reports that he quit smoking about 39 years ago. His smoking use included cigarettes. He has a 51.00 pack-year smoking history. He has never been exposed to tobacco smoke. He has never used smokeless tobacco. No results for input(s): "HGBA1C", "LABURIC" in the last 8760 hours.  Objective:   Vital Signs: There were no vitals taken for this visit.  Physical Exam  Gen: Well-appearing, in no acute distress; non-toxic CV: Well-perfused. Warm.  Resp: Breathing unlabored on room air; no wheezing. Psych: Fluid speech in conversation; appropriate affect; normal thought process Neuro: Sensation intact throughout. No gross coordination deficits.   Ortho Exam - Cervical:   - Left shoulder:  Imaging:  - Cervical XR 03/11/22:  4 views of the cervical spine including AP, lateral and flexion-extension  views were ordered and reviewed by myself.  X-rays  demonstrate a prior  cervical fusion and from C5-C7.  There is grade 1 anterior listhesis of C7  on T1.  There is anterior spurring of C4 and notable facet sclerosis from  C3-C5.  No acute fracture noted.   Past Medical/Family/Surgical/Social History: Medications & Allergies reviewed per EMR, new medications updated. Patient Active Problem List   Diagnosis Date Noted   Moderate episode of recurrent major depressive disorder 01/19/2022   Hearing loss due to cerumen impaction, bilateral 07/13/2021   Panlobular emphysema 01/07/2021   Neuropathy involving both  lower extremities 04/18/2020   Seasonal allergic rhinitis due to pollen 10/24/2018   Essential hypertension 04/25/2018   Laryngopharyngeal reflux (LPR) 02/07/2018   Kidney stone on left side 03/30/2016   IBS (irritable bowel syndrome) 06/27/2015   Coronary artery disease involving native coronary artery of native heart without angina pectoris 06/26/2015   OAB (overactive bladder) 04/16/2015   Atrial flutter    Hyperlipidemia with target LDL less than 70 01/18/2014   BPH (benign prostatic hyperplasia) 04/23/2011   Hypothyroidism 04/15/2011   Routine general medical examination at a health care facility 04/15/2011   Atrial fibrillation ([place])    Past Medical History:  Diagnosis Date   Age-related macular degeneration, dry, left eye    Alcohol dependence    Arthritis    "maybe a little bit in my left knee and right forefinger" (05/30/2015)   Atrial fibrillation    ablation 05-2015 with no reoccurance as of 09-2015   Basal cell carcinoma    Depression    hx   Family history of adverse reaction to anesthesia    "father had head injuries and dementia; everytime he was put under less of his mentation came back"   Gastroparesis    GERD (gastroesophageal reflux disease)    History of kidney stones    Hypercholesterolemia dx'd 02/2015   Hypertension    Hypothyroidism    Kidney stones    Migraine aura without headache    "very rare now" (05/30/2015)   Pneumonia ~ 2004   Squamous carcinoma    hand, chest   Family History  Problem Relation Age of Onset   Stroke Brother    Cancer Brother        basosquamous cell carcinoma-head   Heart failure Mother    Colon cancer Cousin 5   Heart disease Neg Hx    Hyperlipidemia Neg Hx    Hypertension Neg Hx    Diabetes Neg Hx    Past Surgical History:  Procedure Laterality Date   ANTERIOR CERVICAL DECOMP/DISCECTOMY FUSION  04/2009   ATRIAL FIBRILLATION ABLATION  05/30/2015   BACK SURGERY     BASAL CELL CARCINOMA EXCISION     chest or back or  left arm   CARDIOVERSION  06/2002   CATARACT EXTRACTION W/ INTRAOCULAR LENS IMPLANT Left ~ 2012   COLONOSCOPY     COLONOSCOPY W/ POLYPECTOMY  02/2003   CYSTOSCOPY W/ STONE MANIPULATION  1980's   "basketted it out; no stent"   ELECTROPHYSIOLOGIC STUDY N/A 05/30/2015   Procedure: Atrial Fibrillation Ablation;  Surgeon: Will Meredith Leeds, MD;  Location: [facility];  Service: Cardiovascular;  Laterality: N/A;   EXTRACORPOREAL SHOCK WAVE LITHOTRIPSY Left 04/12/2016   Procedure: LEFT EXTRACORPOREAL SHOCK WAVE LITHOTRIPSY (ESWL);  Surgeon: Irine Seal, MD;  Location: [facility];  Service: Urology;  Laterality: Left;   INGUINAL HERNIA REPAIR Bilateral 2004   INGUINAL HERNIA REPAIR Bilateral 2004   MOHS SURGERY Right    "temple"  SQUAMOUS CELL CARCINOMA EXCISION     chest or back or left arm   Social History   Occupational History   Occupation: band Pharmacist, hospital    Comment: retired  Tobacco Use   Smoking status: Former    Packs/day: 3.00    Years: 17.00    Additional pack years: 0.00    Total pack years: 51.00    Types: Cigarettes    Quit date: 03/30/1983    Years since quitting: 39.2    Passive exposure: Never   Smokeless tobacco: Never  Vaping Use   Vaping Use: Never used  Substance and Sexual Activity   Alcohol use: No    Alcohol/week: 0.0 standard drinks of alcohol    Comment: 05/30/2015 "recovering alcoholic; dry date [id number]"   Drug use: No    Types: Marijuana, LSD, Mescaline    Comment: 03/07/2015 "stopped all drugs back in the 1980's"   Sexual activity: Not Currently    Birth control/protection: None   I spent 36 minutes in the care of the patient today including face-to-face time, preparation to see the patient, as well as review of previous cervical xray (03/11/22), review of Dr. Tawanna Sat note, PT notes, discussion on home exercise plan and continued therapy for the above diagnoses.   Elba Barman, DO Primary Care Sports Medicine Physician  [place]  This note was dictated using Dragon naturally speaking software and may contain errors in syntax, spelling, or content which have not been identified prior to signing this note.

## 2022-07-01 NOTE — Progress Notes (Signed)
R ankle has swelling at times. He still favors his ankle to prevent pain.  His main concern is his neck and shoulder that has been going on since November.  He Is in PT 1 x a week and they have helped but then the pain comes back.  He would like to talk about getting the MRI.  Okay ROM until he is in pain then it hurts to move arm.

## 2022-07-06 ENCOUNTER — Encounter: Payer: Medicare PPO | Admitting: Physical Therapy

## 2022-07-13 ENCOUNTER — Encounter: Payer: Medicare PPO | Admitting: Physical Therapy

## 2022-07-18 ENCOUNTER — Ambulatory Visit
Admission: RE | Admit: 2022-07-18 | Discharge: 2022-07-18 | Disposition: A | Discharge status: Home / Self Care | Payer: Medicare PPO | Source: Ambulatory Visit | Attending: Sports Medicine | Admitting: Sports Medicine

## 2022-07-18 DIAGNOSIS — M4802 Spinal stenosis, cervical region: Secondary | ICD-10-CM | POA: Diagnosis not present

## 2022-07-18 DIAGNOSIS — M542 Cervicalgia: Secondary | ICD-10-CM | POA: Diagnosis not present

## 2022-07-18 DIAGNOSIS — Z981 Arthrodesis status: Secondary | ICD-10-CM

## 2022-07-19 ENCOUNTER — Encounter: Payer: Self-pay | Admitting: Cardiology

## 2022-07-19 ENCOUNTER — Ambulatory Visit: Payer: Medicare PPO | Attending: Cardiology | Admitting: Cardiology

## 2022-07-19 VITALS — BP 126/68 | HR 78 | Ht 68.0 in | Wt 138.0 lb

## 2022-07-19 DIAGNOSIS — I10 Essential (primary) hypertension: Secondary | ICD-10-CM

## 2022-07-19 DIAGNOSIS — I4819 Other persistent atrial fibrillation: Secondary | ICD-10-CM

## 2022-07-19 DIAGNOSIS — D6869 Other thrombophilia: Secondary | ICD-10-CM

## 2022-07-19 NOTE — Progress Notes (Signed)
Electrophysiology Office Note   Date:  07/19/2022   ID:  Long, Roy [birth year], MRN [id number]  PCP:  Etta Grandchild, MD  Cardiologist:  Peter [place] Primary Electrophysiologist:  Regan Lemming, MD    No chief complaint on file.     History of Present Illness: Roy Long is a 72 y.o. male who presents today for electrophysiology evaluation.     He has a history significant for atrial fibrillation, hypertension, hyperlipidemia, depression.  He was admitted to [facility] in 2016 with palpitations and was noted to be in atrial flutter.  Atrial fibrillation was diagnosed in 2006.  He is status post ablation for atrial fibrillation/flutter 05/30/2015.  Today, denies symptoms of palpitations, chest pain, shortness of breath, orthopnea, PND, lower extremity edema, claudication, dizziness, presyncope, syncope, bleeding, or neurologic sequela. The patient is tolerating medications without difficulties.  Since being seen he has done well.  He has noted no further episodes of atrial fibrillation.  He does have intermittent palpitations that last a few seconds at a time, but otherwise has had no further issues.  His main issue has been with orthopedic problems with his foot, neck, and shoulder.  These have also been improving and he has been going back to the gym.   Past Medical History:  Diagnosis Date   Age-related macular degeneration, dry, left eye    Alcohol dependence    Arthritis    "maybe a little bit in my left knee and right forefinger" (05/30/2015)   Atrial fibrillation    ablation 05-2015 with no reoccurance as of 09-2015   Basal cell carcinoma    Depression    hx   Family history of adverse reaction to anesthesia    "father had head injuries and dementia; everytime he was put under less of his mentation came back"   Gastroparesis    GERD (gastroesophageal reflux disease)    History of kidney stones    Hypercholesterolemia dx'd 02/2015   Hypertension     Hypothyroidism    Kidney stones    Migraine aura without headache    "very rare now" (05/30/2015)   Pneumonia ~ 2004   Squamous carcinoma    hand, chest   Past Surgical History:  Procedure Laterality Date   ANTERIOR CERVICAL DECOMP/DISCECTOMY FUSION  04/2009   ATRIAL FIBRILLATION ABLATION  05/30/2015   BACK SURGERY     BASAL CELL CARCINOMA EXCISION     chest or back or left arm   CARDIOVERSION  06/2002   CATARACT EXTRACTION W/ INTRAOCULAR LENS IMPLANT Left ~ 2012   COLONOSCOPY     COLONOSCOPY W/ POLYPECTOMY  02/2003   CYSTOSCOPY W/ STONE MANIPULATION  1980's   "basketted it out; no stent"   ELECTROPHYSIOLOGIC STUDY N/A 05/30/2015   Procedure: Atrial Fibrillation Ablation;  Surgeon: Natale Barba Jorja Loa, MD;  Location: [facility];  Service: Cardiovascular;  Laterality: N/A;   EXTRACORPOREAL SHOCK WAVE LITHOTRIPSY Left 04/12/2016   Procedure: LEFT EXTRACORPOREAL SHOCK WAVE LITHOTRIPSY (ESWL);  Surgeon: Bjorn Pippin, MD;  Location: [facility];  Service: Urology;  Laterality: Left;   INGUINAL HERNIA REPAIR Bilateral 2004   INGUINAL HERNIA REPAIR Bilateral 2004   MOHS SURGERY Right    "temple"   SQUAMOUS CELL CARCINOMA EXCISION     chest or back or left arm     Current Outpatient Medications  Medication Sig Dispense Refill   acyclovir (ZOVIRAX) 400 MG tablet TAKE 1 TABLET BY MOUTH EVERY DAY 90 tablet  1   atorvastatin (LIPITOR) 80 MG tablet TAKE 1 TABLET BY MOUTH EVERY DAY 90 tablet 3   cholecalciferol (VITAMIN D) 1000 UNITS tablet Take 1,000 Units by mouth daily.     cycloSPORINE (RESTASIS) 0.05 % ophthalmic emulsion Place 1 drop into both eyes 2 (two) times daily.     diltiazem (CARDIZEM CD) 240 MG 24 hr capsule TAKE 1 CAPSULE BY MOUTH EVERY DAY 90 capsule 3   ELIQUIS 5 MG TABS tablet TAKE 1 TABLET BY MOUTH TWICE A DAY 180 tablet 1   finasteride (PROSCAR) 5 MG tablet TAKE 1 TABLET (5 MG TOTAL) BY MOUTH DAILY. 90 tablet 0   Fluorouracil 5 % SOLN Apply topically.     hydrALAZINE  (APRESOLINE) 50 MG tablet TAKE 1 TABLET BY MOUTH THREE TIMES A DAY 270 tablet 1   levothyroxine (SYNTHROID) 25 MCG tablet TAKE 1 TABLET BY MOUTH EVERY DAY BEFORE BREAKFAST 90 tablet 0   mirtazapine (REMERON) 7.5 MG tablet TAKE 1 TABLET BY MOUTH AT BEDTIME. 90 tablet 0   montelukast (SINGULAIR) 10 MG tablet TAKE 1 TABLET BY MOUTH EVERYDAY AT BEDTIME 90 tablet 1   Multiple Vitamins-Minerals (PRESERVISION AREDS PO) Take 1 tablet by mouth daily.     omeprazole (PRILOSEC) 40 MG capsule TAKE 1 CAPSULE (40 MG TOTAL) BY MOUTH DAILY. 90 capsule 0   tamsulosin (FLOMAX) 0.4 MG CAPS capsule TAKE 1 CAPSULE BY MOUTH EVERY DAY 90 capsule 1   Tiotropium Bromide Monohydrate (SPIRIVA RESPIMAT) 2.5 MCG/ACT AERS INHALE 2 PUFFS BY MOUTH INTO THE LUNGS DAILY 12 g 1   methylPREDNISolone (MEDROL DOSEPAK) 4 MG TBPK tablet Take as directed by pharmacists; 6 day (Patient not taking: Reported on 07/19/2022) 21 tablet 0   No current facility-administered medications for this visit.    Allergies:   Pneumococcal vaccines, Tape, and Amlodipine   Social History:  The patient  reports that he quit smoking about 39 years ago. His smoking use included cigarettes. He has a 51.00 pack-year smoking history. He has never been exposed to tobacco smoke. He has never used smokeless tobacco. He reports that he does not drink alcohol and does not use drugs.   Family History:  The patient's family history includes Cancer in his brother; Colon cancer (age of onset: 69) in his cousin; Heart failure in his mother; Stroke in his brother.   ROS:  Please see the history of present illness.   Otherwise, review of systems is positive for none.   All other systems are reviewed and negative.   PHYSICAL EXAM: VS:  BP 126/68   Pulse 78   Ht  (1.727 m)   Wt 138 lb (62.6 kg)   SpO2 98%   BMI 20.98 kg/m  , BMI Body mass index is 20.98 kg/m. GEN: Well nourished, well developed, in no acute distress  HEENT: normal  Neck: no JVD, carotid  bruits, or masses Cardiac: RRR; no murmus, rubs, or gallops,no edema  Respiratory:  clear to auscultation bilaterally, normal work of breathing GI: soft, nontender, nondistended, + BS MS: no deformity or atrophy  Skin: warm and dry Neuro:  Strength and sensation are intact Psych: euthymic mood, full affect  EKG:  EKG is ordered today. Personal review of the ekg ordered shows sinus rhythm   Recent Labs: 10/27/2021: BUN 12; Creatinine, Ser 0.85; Hemoglobin 14.3; Platelets 208; Potassium 4.0; Sodium 139 01/19/2022: ALT 18; TSH 4.32    Lipid Panel     Component Value Date/Time   CHOL 93 01/19/2022  1631   TRIG 137.0 01/19/2022 1631   HDL 42.40 01/19/2022 1631   CHOLHDL 2 01/19/2022 1631   VLDL 27.4 01/19/2022 1631   LDLCALC 23 01/19/2022 1631     Wt Readings from Last 3 Encounters:  07/19/22 138 lb (62.6 kg)  04/13/22 138 lb (62.6 kg)  03/11/22 137 lb 6.4 oz (62.3 kg)      Other studies Reviewed: Additional studies/ records that were reviewed today include: TTE 11/17/21 Review of the above records today demonstrates:    1. Left ventricular ejection fraction, by estimation, is 60 to 65%. The  left ventricle has normal function. The left ventricle has no regional  wall motion abnormalities. Left ventricular diastolic parameters were  normal. The average left ventricular  global longitudinal strain is 17.6 %. The global longitudinal strain is  normal.   2. Right ventricular systolic function is normal. The right ventricular  size is normal. Tricuspid regurgitation signal is inadequate for assessing  PA pressure.   3. The mitral valve is normal in structure. Trivial mitral valve  regurgitation. No evidence of mitral stenosis.   4. The aortic valve is normal in structure. Aortic valve regurgitation is  not visualized. No aortic stenosis is present.   5. The inferior vena cava is normal in size with greater than 50%  respiratory variability, suggesting right atrial pressure of  3 mmHg.    ASSESSMENT AND PLAN:  1.  Persistent atrial fibrillation/atrial flutter: Status post ablation 05/30/2015.  Currently on Eliquis and diltiazem.  CHA2DS2-VASc of 2.  Since his ablation has done well and is remained in normal rhythm.  Roy Long continue with current management.  2.  Hypertension: Currently well-controlled  3.  Second hypercoagulable state: Currently on Eliquis for atrial fibrillation   Current medicines are reviewed at length with the patient today.   The patient has concerns regarding his medicines.  The following changes were made today: None  Labs/ tests ordered today include:  Orders Placed This Encounter  Procedures   EKG 12-Lead      Disposition:   FU 12 months.  Signed, Yamaris Cummings Jorja Loa, MD  07/19/2022 9:42 AM     [facility] [address] [place] [address] [phone] (office) [phone] (fax)

## 2022-07-27 ENCOUNTER — Ambulatory Visit: Payer: Medicare PPO | Admitting: Sports Medicine

## 2022-07-27 ENCOUNTER — Encounter: Payer: Self-pay | Admitting: Sports Medicine

## 2022-07-27 DIAGNOSIS — M25571 Pain in right ankle and joints of right foot: Secondary | ICD-10-CM

## 2022-07-27 DIAGNOSIS — Z981 Arthrodesis status: Secondary | ICD-10-CM

## 2022-07-27 DIAGNOSIS — M5412 Radiculopathy, cervical region: Secondary | ICD-10-CM | POA: Diagnosis not present

## 2022-07-27 NOTE — Progress Notes (Signed)
Roy Long - 72 y.o. male MRN [id number]  Date of birth: Oct 01, 1950  Office Visit Note: Visit Date: 07/27/2022 PCP: Etta Grandchild, MD Referred by: Etta Grandchild, MD  Subjective: Chief Complaint  Patient presents with   Neck - Follow-up   HPI: Roy Long is a pleasant 72 y.o. male who presents today for follow-up of neck/shoulder pain, as well as R-ankle instability.  Neck/shoulder -still intermittently will get some pain that radiates from the left side of the neck to the anterior aspect of the shoulder, very infrequently will go down into the bicep/elbow area, no symptoms in the hands or fingers.  He has returned to doing the cervical isometric exercises almost every day as well as doing activity at the gym.  Since this time he feels he is much improved, his symptoms are 80-90% better.  He is able to work in the yard and do other activities without much pain.  Right ankle -his swelling and pain is much better.  He still feels like there is some instability in the ankle.  Occasionally he will wear his brace but is able to do most of his work without it.  Pertinent ROS were reviewed with the patient and found to be negative unless otherwise specified above in HPI.   Assessment & Plan: Visit Diagnoses:  1. Radiculopathy, cervical region   2. History of fusion of cervical spine   3. Pain in right ankle and joints of right foot    Plan: Discussed with [name] the nature of his cervical radiculopathy and did review the MRI with him in the room today. I do think he has a degree of adjacent segment disease that is superior to his fusion.  However, given his cervical isometrics and his activity at the gym his pain is almost 90% improved.  Given the improvement in him getting through his activities without difficulty, I would hold off on additional treatment at this time.  I do think that a fair amount of his symptoms are from  his at least moderate foraminal stenosis at the C4-C5 level.   Additional treatment considerations if he does not continue to improve would be a cervical ESI by Dr. Alvester Morin.  Adjacent superior fusion could always be an option, although at this point I think it is best to hold off and continue to continue his cervical isometrics and home/gym therapy.  In terms of the ankle, he needs to work on strengthening the fascia and surrounding ligamental structures.  We did print out a customized handout for him to do Thera-Band activity and proprioception exercises, my athletic trainer did review these in the room with him today.  He will perform these at least 3 times weekly.  Would like to see what sort of relief he gets doing these consistently for about 4 to 6 weeks.  He will follow-up with me as needed. Can continue tylenol, OTC meds as needed.  Future treatment considerations: Cervical ESI Alvester Morin), Repeat trigger point injections  Follow-up: Return if symptoms worsen or fail to improve.   Meds & Orders: No orders of the defined types were placed in this encounter.  No orders of the defined types were placed in this encounter.    Procedures: No procedures performed      Clinical History: No specialty comments available.  He reports that he quit smoking about 39 years ago. His smoking use included cigarettes. He has a 51.00 pack-year smoking history. He has never been exposed to tobacco  smoke. He has never used smokeless tobacco. No results for input(s): "HGBA1C", "LABURIC" in the last 8760 hours.  Objective:   Vital Signs: There were no vitals taken for this visit.  Physical Exam  Gen: Well-appearing, in no acute distress; non-toxic CV: Well-perfused. Warm.  Resp: Breathing unlabored on room air; no wheezing. Psych: Fluid speech in conversation; appropriate affect; normal thought process Neuro: Sensation intact throughout. No gross coordination deficits.   Ortho Exam - Cervical/shoulder: Prior fusion at C5-C7 with resultant loss of extension/flexion.   There are some hypertonicity of the left paraspinals and trapezius muscle belly.  There is resultant upper extremities bilaterally equivocally.  - R-ankle: No significant redness or effusion.  There is some slightly limited dorsiflexion.  Some increased laxity with talar tilt compared to the contralateral ankle.   Imaging: MR Cervical Spine w/o contrast CLINICAL DATA:  72 year old male with left side neck pain radiating to the shoulder. Stiffness, decreased range of motion since November. Prior surgery.  EXAM: MRI CERVICAL SPINE WITHOUT CONTRAST  TECHNIQUE: Multiplanar, multisequence MR imaging of the cervical spine was performed. No intravenous contrast was administered.  COMPARISON:  Cervical spine radiographs 03/11/2022.  FINDINGS: Alignment: Stable since December, mild straightening of lordosis. Subtle anterolisthesis of C7 on T1. No other significant spondylolisthesis.  Vertebrae: Normal background bone marrow signal. Mild hardware susceptibility artifact from C5-C6 and C6-C7 ACDF. Evidence of solid arthrodesis at those levels.  Moderate patchy marrow edema affecting the chronically degenerated left C3-C4 facets (series 5, images 14 and 15). Normal background bone marrow signal. Superimposed right side facet ankylosis at C2-C3.  No other marrow edema or acute osseous abnormality. In the cervical spine.  However, there is evidence of mild degenerative posterior element marrow edema in the right upper thoracic spine T1 and T2 (facets/pars series 5, image 4).  Cord: Degenerative spinal cord mass effect is mild at C3-C4 and C4-C5. No spinal cord edema or myelomalacia. Negative visible upper thoracic cord.  Posterior Fossa, vertebral arteries, paraspinal tissues: Cervicomedullary junction is within normal limits. Negative visible posterior fossa.  Maintained major vascular flow voids in the neck with dominant appearing left vertebral artery. Negative visible neck soft  tissues, lung apices.  Disc levels:  C2-C3:  Ankylosed right facet. No stenosis.  C3-C4: Broad-based posterior disc bulge or protrusion with foraminal and posterior endplate spurring. Moderate ligament flavum (series 7, image 9) and mild to moderate facet hypertrophy which is greater on the left (marrow edema there described above). Mild spinal stenosis and spinal cord mass effect. Moderate to severe left and mild-to-moderate right C4 neural foraminal stenosis.  C4-C5: Disc space loss with circumferential disc bulge. Broad-based posterior component. Similar endplate spurring. Moderate facet and ligament flavum hypertrophy (series 7, image 13). Mild spinal stenosis and spinal cord mass effect. Moderate to severe bilateral C5 foraminal stenosis.  C5-C6:  Prior ACDF with solid arthrodesis.  No stenosis.  C6-C7:  Prior ACDF with solid arthrodesis.  No stenosis.  C7-T1: Subtle anterolisthesis. Mild facet and ligament flavum hypertrophy. No stenosis.  T1-T2: Subtle anterolisthesis and disc bulging. Moderate facet hypertrophy with evidence of a left side foraminal 6 mm degenerative synovial cyst (series 5, image 12). Associated mild to moderate left T1 and mild right T1 neural foraminal stenosis.  T2-T3: Negative, no stenosis.  IMPRESSION: 1. Prior ACDF at C5-C6 and C6-C7 with solid arthrodesis and no adverse features. And superimposed right side facet ankylosis at C2-C3.  2. 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 Subsequent mild multifactorial spinal stenosis AND spinal cord mass effect at both levels. No cord signal abnormality. Moderate to severe bilateral C4 and C5 neural foraminal stenosis.  3. Lesser adjacent segment disease at C7-T1 with facet hypertrophy. And moderate facet arthropathy at the T1-T2 level  including a left foraminal synovial cyst contributing to moderate to severe left T1 neural foraminal stenosis.  Electronically Signed   By: Odessa Fleming M.D.   On: 07/22/2022 14:17   Past Medical/Family/Surgical/Social History: Medications & Allergies reviewed per EMR, new medications updated. Patient Active Problem List   Diagnosis Date Noted   Moderate episode of recurrent major depressive disorder (HCC) 01/19/2022   Hearing loss due to cerumen impaction, bilateral 07/13/2021   Panlobular emphysema (HCC) 01/07/2021   Neuropathy involving both lower extremities 04/18/2020   Seasonal allergic rhinitis due to pollen 10/24/2018   Essential hypertension 04/25/2018   Laryngopharyngeal reflux (LPR) 02/07/2018   Kidney stone on left side 03/30/2016   IBS (irritable bowel syndrome) 06/27/2015   Coronary artery disease involving native coronary artery of native heart without angina pectoris 06/26/2015   OAB (overactive bladder) 04/16/2015   Atrial flutter (HCC)    Hyperlipidemia with target LDL less than 70 01/18/2014   BPH (benign prostatic hyperplasia) 04/23/2011   Hypothyroidism 04/15/2011   Routine general medical examination at a health care facility 04/15/2011   Atrial fibrillation (HCC)    Past Medical History:  Diagnosis Date   Age-related macular degeneration, dry, left eye    Alcohol dependence (HCC)    Arthritis    "maybe a little bit in my left knee and right forefinger" (05/30/2015)   Atrial fibrillation (HCC)    ablation 05-2015 with no reoccurance as of 09-2015   Basal cell carcinoma    Depression    hx   Family history of adverse reaction to anesthesia    "father had head injuries and dementia; everytime he was put under less of his mentation came back"   Gastroparesis    GERD (gastroesophageal reflux disease)    History of kidney stones    Hypercholesterolemia dx'd 02/2015   Hypertension    Hypothyroidism    Kidney stones    Migraine aura without headache    "very  rare now" (05/30/2015)   Pneumonia ~ 2004   Squamous carcinoma    hand, chest   Family History  Problem Relation Age of Onset   Stroke Brother    Cancer Brother        basosquamous cell carcinoma-head   Heart failure Mother    Colon cancer Cousin 50   Heart disease Neg Hx    Hyperlipidemia Neg Hx    Hypertension Neg Hx    Diabetes Neg Hx    Past Surgical History:  Procedure Laterality Date   ANTERIOR CERVICAL DECOMP/DISCECTOMY FUSION  04/2009   ATRIAL FIBRILLATION ABLATION  05/30/2015   BACK SURGERY     BASAL CELL CARCINOMA EXCISION     chest or back or left arm   CARDIOVERSION  06/2002   CATARACT EXTRACTION W/ INTRAOCULAR LENS IMPLANT Left ~ 2012   COLONOSCOPY     COLONOSCOPY W/ POLYPECTOMY  02/2003   CYSTOSCOPY W/ STONE MANIPULATION  1980's   "basketted it out; no stent"   ELECTROPHYSIOLOGIC STUDY N/A 05/30/2015   Procedure: Atrial Fibrillation Ablation;  Surgeon: Will Jorja Loa, MD;  Location: [facility];  Service: Cardiovascular;  Laterality: N/A;   EXTRACORPOREAL SHOCK WAVE LITHOTRIPSY Left 04/12/2016   Procedure: LEFT EXTRACORPOREAL SHOCK WAVE LITHOTRIPSY (ESWL);  Surgeon: Bjorn Pippin, MD;  Location: [facility];  Service: Urology;  Laterality: Left;   INGUINAL HERNIA REPAIR Bilateral 2004   INGUINAL HERNIA REPAIR Bilateral 2004   MOHS SURGERY Right    "temple"   SQUAMOUS CELL CARCINOMA EXCISION     chest or back or left arm   Social History   Occupational History   Occupation: band Runner, broadcasting/film/video    Comment: retired  Tobacco Use   Smoking status: Former    Packs/day: 3.00    Years: 17.00    Additional pack years: 0.00    Total pack years: 51.00    Types: Cigarettes    Quit date: 03/30/1983    Years since quitting: 39.3    Passive exposure: Never   Smokeless tobacco: Never  Vaping Use   Vaping Use: Never used  Substance and Sexual Activity   Alcohol use: No    Alcohol/week: 0.0 standard drinks of alcohol    Comment: 05/30/2015 "recovering alcoholic; dry date  04/19/2011"   Drug use: No    Types: Marijuana, LSD, Mescaline    Comment: 03/07/2015 "stopped all drugs back in the 1980's"   Sexual activity: Not Currently    Birth control/protection: None   I spent 38 minutes in the care of the patient today including face-to-face time, preparation to see the patient, as well as discussion and review of his MRI; discussion and education on home exercise plan and the gym therapy for both the ankle and his neck, review of further treatment (injection-ESI, possible surgeries) for the above diagnoses.   Madelyn Brunner, DO Primary Care Sports Medicine Physician  [facility]  This note was dictated using Dragon naturally speaking software and may contain errors in syntax, spelling, or content which have not been identified prior to signing this note.

## 2022-07-27 NOTE — Progress Notes (Signed)
Mri review  Patient was instructed in 10 minutes of therapeutic exercises for right ankle to improve strength, ROM and function according to my instructions and plan of care by a Certified Athletic Trainer during the office visit. A customized handout was provided and demonstration of proper technique shown and discussed. Patient did perform exercises and demonstrate understanding through teachback.  All questions discussed and answered.

## 2022-08-02 ENCOUNTER — Ambulatory Visit: Payer: Medicare PPO | Admitting: Internal Medicine

## 2022-08-02 ENCOUNTER — Other Ambulatory Visit: Payer: Self-pay | Admitting: Internal Medicine

## 2022-08-02 ENCOUNTER — Telehealth: Payer: Self-pay

## 2022-08-02 ENCOUNTER — Encounter: Payer: Self-pay | Admitting: Internal Medicine

## 2022-08-02 VITALS — BP 138/64 | HR 69 | Temp 98.0°F | Resp 16 | Ht 68.0 in | Wt 136.0 lb

## 2022-08-02 DIAGNOSIS — K2100 Gastro-esophageal reflux disease with esophagitis, without bleeding: Secondary | ICD-10-CM

## 2022-08-02 DIAGNOSIS — F331 Major depressive disorder, recurrent, moderate: Secondary | ICD-10-CM | POA: Diagnosis not present

## 2022-08-02 DIAGNOSIS — I10 Essential (primary) hypertension: Secondary | ICD-10-CM

## 2022-08-02 DIAGNOSIS — E039 Hypothyroidism, unspecified: Secondary | ICD-10-CM

## 2022-08-02 DIAGNOSIS — K219 Gastro-esophageal reflux disease without esophagitis: Secondary | ICD-10-CM

## 2022-08-02 DIAGNOSIS — Z23 Encounter for immunization: Secondary | ICD-10-CM

## 2022-08-02 LAB — BASIC METABOLIC PANEL
BUN: 13 mg/dL (ref 6–23)
CO2: 26 mEq/L (ref 19–32)
Calcium: 9.2 mg/dL (ref 8.4–10.5)
Chloride: 105 mEq/L (ref 96–112)
Creatinine, Ser: 0.85 mg/dL (ref 0.40–1.50)
GFR: 85.91 mL/min (ref >60.00)
Glucose, Bld: 96 mg/dL (ref 70–99)
Potassium: 3.9 mEq/L (ref 3.5–5.1)
Sodium: 140 mEq/L (ref 135–145)

## 2022-08-02 LAB — CBC WITH DIFFERENTIAL/PLATELET
Basophils Absolute: 0 10*3/uL (ref 0.0–0.1)
Basophils Relative: 0.3 % (ref 0.0–3.0)
Eosinophils Absolute: 0.1 10*3/uL (ref 0.0–0.7)
Eosinophils Relative: 1.2 % (ref 0.0–5.0)
HCT: 42.8 % (ref 39.0–52.0)
Hemoglobin: 14.7 g/dL (ref 13.0–17.0)
Lymphocytes Relative: 45.5 % (ref 12.0–46.0)
Lymphs Abs: 2.9 10*3/uL (ref 0.7–4.0)
MCHC: 34.4 g/dL (ref 30.0–36.0)
MCV: 94.9 fl (ref 78.0–100.0)
Monocytes Absolute: 0.6 10*3/uL (ref 0.1–1.0)
Monocytes Relative: 10.3 % (ref 3.0–12.0)
Neutro Abs: 2.7 10*3/uL (ref 1.4–7.7)
Neutrophils Relative %: 42.7 % — ABNORMAL LOW (ref 43.0–77.0)
Platelets: 180 10*3/uL (ref 150.0–400.0)
RBC: 4.51 Mil/uL (ref 4.22–5.81)
RDW: 12 % (ref 11.5–15.5)
WBC: 6.3 10*3/uL (ref 4.0–10.5)

## 2022-08-02 LAB — TSH: TSH: 4.78 u[IU]/mL (ref 0.35–5.50)

## 2022-08-02 MED ORDER — BOOSTRIX 5-2.5-18.5 LF-MCG/0.5 IM SUSP
0.5000 mL | Freq: Once | INTRAMUSCULAR | 0 refills | Status: AC
Start: 2022-08-02 — End: 2022-08-02

## 2022-08-02 MED ORDER — MIRTAZAPINE 7.5 MG PO TABS
7.5000 mg | ORAL_TABLET | Freq: Every day | ORAL | 0 refills | Status: DC
Start: 2022-08-02 — End: 2022-10-31

## 2022-08-02 MED ORDER — FAMOTIDINE 40 MG PO TABS
40.0000 mg | ORAL_TABLET | Freq: Every day | ORAL | 1 refills | Status: DC
Start: 2022-08-02 — End: 2023-01-14

## 2022-08-02 MED ORDER — FINASTERIDE 5 MG PO TABS: 5.0000 mg | ORAL_TABLET | Freq: Every day | ORAL | 0 refills | Status: DC

## 2022-08-02 NOTE — Progress Notes (Signed)
   08/02/2022  Patient ID: Roy Long, male   DOB: 01-24-1951, 72 y.o.   MRN: [id number]  Received request from PCP, Dr. Yetta Barre, to do a medication review with Roy Long.  Patient outreach unsuccessful, but I left a message with my direct number for him to call and schedule a telephone visit.  Lenna Gilford, PharmD, DPLA

## 2022-08-02 NOTE — Patient Instructions (Signed)
Gastroesophageal Reflux Disease, Adult Gastroesophageal reflux (GER) happens when acid from the stomach flows up into the tube that connects the mouth and the stomach (esophagus). Normally, food travels down the esophagus and stays in the stomach to be digested. However, when a person has GER, food and stomach acid sometimes move back up into the esophagus. If this becomes a more serious problem, the person may be diagnosed with a disease called gastroesophageal reflux disease (GERD). GERD occurs when the reflux: Happens often. Causes frequent or severe symptoms. Causes problems such as damage to the esophagus. When stomach acid comes in contact with the esophagus, the acid may cause inflammation in the esophagus. Over time, GERD may create small holes (ulcers) in the lining of the esophagus. What are the causes? This condition is caused by a problem with the muscle between the esophagus and the stomach (lower esophageal sphincter, or LES). Normally, the LES muscle closes after food passes through the esophagus to the stomach. When the LES is weakened or abnormal, it does not close properly, and that allows food and stomach acid to go back up into the esophagus. The LES can be weakened by certain dietary substances, medicines, and medical conditions, including: Tobacco use. Pregnancy. Having a hiatal hernia. Alcohol use. Certain foods and beverages, such as coffee, chocolate, onions, and peppermint. What increases the risk? You are more likely to develop this condition if you: Have an increased body weight. Have a connective tissue disorder. Take NSAIDs, such as ibuprofen. What are the signs or symptoms? Symptoms of this condition include: Heartburn. Difficult or painful swallowing and the feeling of having a lump in the throat. A bitter taste in the mouth. Bad breath and having a large amount of saliva. Having an upset or bloated stomach and belching. Chest pain. Different conditions can  cause chest pain. Make sure you see your health care provider if you experience chest pain. Shortness of breath or wheezing. Ongoing (chronic) cough or a nighttime cough. Wearing away of tooth enamel. Weight loss. How is this diagnosed? This condition may be diagnosed based on a medical history and a physical exam. To determine if you have mild or severe GERD, your health care provider may also monitor how you respond to treatment. You may also have tests, including: A test to examine your stomach and esophagus with a small camera (endoscopy). A test that measures the acidity level in your esophagus. A test that measures how much pressure is on your esophagus. A barium swallow or modified barium swallow test to show the shape, size, and functioning of your esophagus. How is this treated? Treatment for this condition may vary depending on how severe your symptoms are. Your health care provider may recommend: Changes to your diet. Medicine. Surgery. The goal of treatment is to help relieve your symptoms and to prevent complications. Follow these instructions at home: Eating and drinking  Follow a diet as recommended by your health care provider. This may involve avoiding foods and drinks such as: Coffee and tea, with or without caffeine. Drinks that contain alcohol. Energy drinks and sports drinks. Carbonated drinks or sodas. Chocolate and cocoa. Peppermint and mint flavorings. Garlic and onions. Horseradish. Spicy and acidic foods, including peppers, chili powder, curry powder, vinegar, hot sauces, and barbecue sauce. Citrus fruit juices and citrus fruits, such as oranges, lemons, and limes. Tomato-based foods, such as red sauce, chili, salsa, and pizza with red sauce. Fried and fatty foods, such as donuts, french fries, potato chips, and high-fat dressings.   High-fat meats, such as hot dogs and fatty cuts of red and white meats, such as rib eye steak, sausage, ham, and  bacon. High-fat dairy items, such as whole milk, butter, and cream cheese. Eat small, frequent meals instead of large meals. Avoid drinking large amounts of liquid with your meals. Avoid eating meals during the 2-3 hours before bedtime. Avoid lying down right after you eat. Do not exercise right after you eat. Lifestyle  Do not use any products that contain nicotine or tobacco. These products include cigarettes, chewing tobacco, and vaping devices, such as e-cigarettes. If you need help quitting, ask your health care provider. Try to reduce your stress by using methods such as yoga or meditation. If you need help reducing stress, ask your health care provider. If you are overweight, reduce your weight to an amount that is healthy for you. Ask your health care provider for guidance about a safe weight loss goal. General instructions Pay attention to any changes in your symptoms. Take over-the-counter and prescription medicines only as told by your health care provider. Do not take aspirin, ibuprofen, or other NSAIDs unless your health care provider told you to take these medicines. Wear loose-fitting clothing. Do not wear anything tight around your waist that causes pressure on your abdomen. Raise (elevate) the head of your bed about 6 inches (15 cm). You can use a wedge to do this. Avoid bending over if this makes your symptoms worse. Keep all follow-up visits. This is important. Contact a health care provider if: You have: New symptoms. Unexplained weight loss. Difficulty swallowing or it hurts to swallow. Wheezing or a persistent cough. A hoarse voice. Your symptoms do not improve with treatment. Get help right away if: You have sudden pain in your arms, neck, jaw, teeth, or back. You suddenly feel sweaty, dizzy, or light-headed. You have chest pain or shortness of breath. You vomit and the vomit is green, yellow, or black, or it looks like blood or coffee grounds. You faint. You  have stool that is red, bloody, or black. You cannot swallow, drink, or eat. These symptoms may represent a serious problem that is an emergency. Do not wait to see if the symptoms will go away. Get medical help right away. Call your local emergency services (911 in the [place] Do not drive yourself to the hospital. Summary Gastroesophageal reflux happens when acid from the stomach flows up into the esophagus. GERD is a disease in which the reflux happens often, causes frequent or severe symptoms, or causes problems such as damage to the esophagus. Treatment for this condition may vary depending on how severe your symptoms are. Your health care provider may recommend diet and lifestyle changes, medicine, or surgery. Contact a health care provider if you have new or worsening symptoms. Take over-the-counter and prescription medicines only as told by your health care provider. Do not take aspirin, ibuprofen, or other NSAIDs unless your health care provider told you to do so. Keep all follow-up visits as told by your health care provider. This is important. This information is not intended to replace advice given to you by your health care provider. Make sure you discuss any questions you have with your health care provider. Document Revised: 09/24/2019 Document Reviewed: 09/24/2019 Elsevier Patient Education  2023 [employer] Inc[employer]  

## 2022-08-02 NOTE — Progress Notes (Unsigned)
Subjective:  Patient ID: Roy Long, male    DOB: 03-Jan-1951  Age: 72 y.o. MRN: [id number]  CC: Gastroesophageal Reflux and Hypothyroidism   HPI Roy Long presents for f/up -----  He continues to complain of heartburn.  He is not taking the PPI first thing in the morning on an empty stomach.  He was told by gastroenterologist to take it twice a day but he is not taking it twice a day.  He complains of early satiety, throat clearing,and nocturnal heartburn but denies odynophagia, dysphagia, loss of appetite, or weight loss.  Outpatient Medications Prior to Visit  Medication Sig Dispense Refill   acyclovir (ZOVIRAX) 400 MG tablet TAKE 1 TABLET BY MOUTH EVERY DAY 90 tablet 1   atorvastatin (LIPITOR) 80 MG tablet TAKE 1 TABLET BY MOUTH EVERY DAY 90 tablet 3   cholecalciferol (VITAMIN D) 1000 UNITS tablet Take 1,000 Units by mouth daily.     cycloSPORINE (RESTASIS) 0.05 % ophthalmic emulsion Place 1 drop into both eyes 2 (two) times daily.     diltiazem (CARDIZEM CD) 240 MG 24 hr capsule TAKE 1 CAPSULE BY MOUTH EVERY DAY 90 capsule 3   ELIQUIS 5 MG TABS tablet TAKE 1 TABLET BY MOUTH TWICE A DAY 180 tablet 1   Fluorouracil 5 % SOLN Apply topically. (Patient not taking: Reported on 08/03/2022)     hydrALAZINE (APRESOLINE) 50 MG tablet TAKE 1 TABLET BY MOUTH THREE TIMES A DAY 270 tablet 1   levothyroxine (SYNTHROID) 25 MCG tablet TAKE 1 TABLET BY MOUTH EVERY DAY BEFORE BREAKFAST 90 tablet 0   montelukast (SINGULAIR) 10 MG tablet TAKE 1 TABLET BY MOUTH EVERYDAY AT BEDTIME 90 tablet 1   Multiple Vitamins-Minerals (PRESERVISION AREDS PO) Take 1 tablet by mouth daily.     omeprazole (PRILOSEC) 40 MG capsule TAKE 1 CAPSULE (40 MG TOTAL) BY MOUTH DAILY. 90 capsule 0   tamsulosin (FLOMAX) 0.4 MG CAPS capsule TAKE 1 CAPSULE BY MOUTH EVERY DAY 90 capsule 1   Tiotropium Bromide Monohydrate (SPIRIVA RESPIMAT) 2.5 MCG/ACT AERS INHALE 2 PUFFS BY MOUTH INTO THE LUNGS DAILY 12 g 1   finasteride  (PROSCAR) 5 MG tablet TAKE 1 TABLET (5 MG TOTAL) BY MOUTH DAILY. 90 tablet 0   methylPREDNISolone (MEDROL DOSEPAK) 4 MG TBPK tablet Take as directed by pharmacists; 6 day 21 tablet 0   mirtazapine (REMERON) 7.5 MG tablet TAKE 1 TABLET BY MOUTH AT BEDTIME. 90 tablet 0   No facility-administered medications prior to visit.    ROS Review of Systems  Constitutional: Negative.  Negative for diaphoresis and fatigue.  HENT: Negative.  Negative for trouble swallowing.   Eyes: Negative.   Respiratory:  Negative for cough, chest tightness, shortness of breath and wheezing.   Cardiovascular:  Negative for chest pain, palpitations and leg swelling.  Gastrointestinal:  Negative for abdominal pain, constipation, diarrhea, nausea and vomiting.  Endocrine: Negative.   Genitourinary: Negative.  Negative for difficulty urinating.  Musculoskeletal: Negative.  Negative for back pain and myalgias.  Skin: Negative.   Neurological:  Negative for dizziness and weakness.  Hematological:  Negative for adenopathy. Does not bruise/bleed easily.  Psychiatric/Behavioral:  Positive for dysphoric mood. Negative for sleep disturbance. The patient is not nervous/anxious.     Objective:  BP 138/64 (BP Location: Left Arm, Patient Position: Sitting, Cuff Size: Large)   Pulse 69   Temp 98 F (36.7 C) (Oral)   Resp 16   Ht 5\' 8"  (1.727 m)   Wt 136  lb (61.7 kg)   SpO2 98%   BMI 20.68 kg/m   BP Readings from Last 3 Encounters:  08/02/22 138/64  07/19/22 126/68  04/13/22 (!) 169/71    Wt Readings from Last 3 Encounters:  08/02/22 136 lb (61.7 kg)  07/19/22 138 lb (62.6 kg)  04/13/22 138 lb (62.6 kg)    Physical Exam Vitals reviewed.  Constitutional:      Appearance: Normal appearance.  HENT:     Nose: Nose normal.     Mouth/Throat:     Mouth: Mucous membranes are moist.  Eyes:     General: No scleral icterus.    Conjunctiva/sclera: Conjunctivae normal.  Cardiovascular:     Rate and Rhythm: Normal  rate and regular rhythm.     Heart sounds: No murmur heard. Pulmonary:     Effort: Pulmonary effort is normal.     Breath sounds: No stridor. No wheezing, rhonchi or rales.  Abdominal:     Palpations: There is no mass.     Tenderness: There is no abdominal tenderness. There is no guarding.     Hernia: No hernia is present.  Musculoskeletal:        General: Normal range of motion.     Cervical back: Neck supple.     Right lower leg: No edema.     Left lower leg: No edema.  Lymphadenopathy:     Cervical: No cervical adenopathy.  Skin:    General: Skin is warm and dry.  Neurological:     General: No focal deficit present.     Mental Status: He is alert. Mental status is at baseline.  Psychiatric:        Attention and Perception: Attention normal.        Mood and Affect: Mood normal.        Behavior: Behavior normal.     Lab Results  Component Value Date   WBC 6.3 08/02/2022   HGB 14.7 08/02/2022   HCT 42.8 08/02/2022   PLT 180.0 08/02/2022   GLUCOSE 96 08/02/2022   CHOL 93 01/19/2022   TRIG 137.0 01/19/2022   HDL 42.40 01/19/2022   LDLCALC 23 01/19/2022   ALT 18 01/19/2022   AST 18 01/19/2022   NA 140 08/02/2022   K 3.9 08/02/2022   CL 105 08/02/2022   CREATININE 0.85 08/02/2022   BUN 13 08/02/2022   CO2 26 08/02/2022   TSH 4.78 08/02/2022   PSA 0.07 (L) 01/19/2022   INR 1.0 04/15/2011   HGBA1C 5.5 04/18/2020    MR Cervical Spine w/o contrast  Result Date: 07/22/2022 CLINICAL DATA:  72 year old male with left side neck pain radiating to the shoulder. Stiffness, decreased range of motion since November. Prior surgery. EXAM: MRI CERVICAL SPINE WITHOUT CONTRAST TECHNIQUE: Multiplanar, multisequence MR imaging of the cervical spine was performed. No intravenous contrast was administered. COMPARISON:  Cervical spine radiographs 03/11/2022. FINDINGS: Alignment: Stable since December, mild straightening of lordosis. Subtle anterolisthesis of C7 on T1. No other significant  spondylolisthesis. Vertebrae: Normal background bone marrow signal. Mild hardware susceptibility artifact from C5-C6 and C6-C7 ACDF. Evidence of solid arthrodesis at those levels. Moderate patchy marrow edema affecting the chronically degenerated left C3-C4 facets (series 5, images 14 and 15). Normal background bone marrow signal. Superimposed right side facet ankylosis at C2-C3. No other marrow edema or acute osseous abnormality. In the cervical spine. However, there is evidence of mild degenerative posterior element marrow edema in the right upper thoracic spine T1 and T2 (facets/pars series  5, image 4). Cord: Degenerative spinal cord mass effect is mild at C3-C4 and C4-C5. No spinal cord edema or myelomalacia. Negative visible upper thoracic cord. Posterior Fossa, vertebral arteries, paraspinal tissues: Cervicomedullary junction is within normal limits. Negative visible posterior fossa. Maintained major vascular flow voids in the neck with dominant appearing left vertebral artery. Negative visible neck soft tissues, lung apices. Disc levels: C2-C3:  Ankylosed right facet. No stenosis. C3-C4: Broad-based posterior disc bulge or protrusion with foraminal and posterior endplate spurring. Moderate ligament flavum (series 7, image 9) and mild to moderate facet hypertrophy which is greater on the left (marrow edema there described above). Mild spinal stenosis and spinal cord mass effect. Moderate to severe left and mild-to-moderate right C4 neural foraminal stenosis. C4-C5: Disc space loss with circumferential disc bulge. Broad-based posterior component. Similar endplate spurring. Moderate facet and ligament flavum hypertrophy (series 7, image 13). Mild spinal stenosis and spinal cord mass effect. Moderate to severe bilateral C5 foraminal stenosis. C5-C6:  Prior ACDF with solid arthrodesis.  No stenosis. C6-C7:  Prior ACDF with solid arthrodesis.  No stenosis. C7-T1: Subtle anterolisthesis. Mild facet and ligament  flavum hypertrophy. No stenosis. T1-T2: Subtle anterolisthesis and disc bulging. Moderate facet hypertrophy with evidence of a left side foraminal 6 mm degenerative synovial cyst (series 5, image 12). Associated mild to moderate left T1 and mild right T1 neural foraminal stenosis. T2-T3: Negative, no stenosis. IMPRESSION: 1. Prior ACDF at C5-C6 and C6-C7 with solid arthrodesis and no adverse features. And superimposed right side facet ankylosis at C2-C3. 2. 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 Subsequent mild multifactorial spinal stenosis AND spinal cord mass effect at both levels. No cord signal abnormality. Moderate to severe bilateral C4 and C5 neural foraminal stenosis. 3. Lesser adjacent segment disease at C7-T1 with facet hypertrophy. And moderate facet arthropathy at the T1-T2 level including a left foraminal synovial cyst contributing to moderate to severe left T1 neural foraminal stenosis. Electronically Signed   By: Odessa Fleming M.D.   On: 07/22/2022 14:17    Assessment & Plan:   Essential hypertension- His blood pressure is well-controlled. -     TSH; Future -     CBC with Differential/Platelet; Future -     Basic metabolic panel; Future  Acquired hypothyroidism- He is euthyroid. -     TSH; Future  Laryngopharyngeal reflux (LPR)- He agrees to take the PPI properly and will add an H2 blocker. -     Famotidine; Take 1 tablet (40 mg total) by mouth at bedtime.  Dispense: 90 tablet; Refill: 1  Gastroesophageal reflux disease with esophagitis without hemorrhage -     Famotidine; Take 1 tablet (40 mg total) by mouth at bedtime.  Dispense: 90 tablet; Refill: 1  Need for prophylactic vaccination with combined diphtheria-tetanus-pertussis (DTP) vaccine -     Boostrix; Inject 0.5 mLs into the muscle once for 1 dose.  Dispense: 0.5 mL;  Refill: 0  Moderate episode of recurrent major depressive disorder (HCC) -     Mirtazapine; Take 1 tablet (7.5 mg total) by mouth at bedtime.  Dispense: 90 tablet; Refill: 0  Other orders -     Finasteride; Take 1 tablet (5 mg total) by mouth daily.  Dispense: 90 tablet; Refill: 0     Follow-up: Return in about 6 months (around 02/02/2023).  Sanda Linger, MD

## 2022-08-03 ENCOUNTER — Other Ambulatory Visit: Payer: Medicare PPO

## 2022-08-03 NOTE — Progress Notes (Unsigned)
08/03/2022 Name: Roy Long MRN: [id number] DOB: [birth year]  Chief Complaint  Patient presents with   Medication Management   Roy Long is a 72 y.o. year old male who presented for a telephone visit.   They were referred to the pharmacist by their PCP for assistance in managing complex medication management.   Patient is participating in a Managed Medicaid Plan:  No  Subjective:  Care Team: Primary Care Provider: Etta Grandchild, MD ; Next Scheduled Visit: 02/03/23  Medication Access/Adherence/Management -Patient reports affordability concerns with their medications: No  -Patient reports access/transportation concerns to their pharmacy: No  -Patient reports adherence concerns with their medications:  No   -Discussed recommended timing of medications and appropriateness of coadministration -Taking tamsulosin at night, which can increase need to urinate during bedtime -Taking night-time medications close to bedtime and still taking a while to fall asleep even with mirtazapine -Unaware that levothyroxine should be separated from food and all other medications  -Patient has long-standing gastroparesis and esophagitis that causes burning sensation in throat, but does not endorse reflux-like sensation. Was not aware to take PP 30 minutes prior to a meal. -Good adherence to medication regimen, using weekly pill organizer  Hypertension: Current medications: diltiazem CD 240mg  daily, hydralazine 50mg  TID -Recent clinic BP 138/64  Hyperlipidemia/ASCVD Risk Reduction Current lipid lowering medications: atorvastatin 80mg  daily   Objective: Lab Results  Component Value Date   HGBA1C 5.5 04/18/2020   Lab Results  Component Value Date   CREATININE 0.85 08/02/2022   BUN 13 08/02/2022   NA 140 08/02/2022   K 3.9 08/02/2022   CL 105 08/02/2022   CO2 26 08/02/2022   Lab Results  Component Value Date   CHOL 93 01/19/2022   HDL 42.40 01/19/2022   LDLCALC 23 01/19/2022   TRIG  137.0 01/19/2022   CHOLHDL 2 01/19/2022   Medications Reviewed Today     Reviewed by Etta Grandchild, MD (Physician) on 08/02/22 at 1032  Med List Status: <None>   Medication Order Taking? Sig Documenting Provider Last Dose Status Informant  acyclovir (ZOVIRAX) 400 MG tablet [id number] Yes TAKE 1 TABLET BY MOUTH EVERY DAY Etta Grandchild, MD Taking Active   atorvastatin (LIPITOR) 80 MG tablet [id number] Yes TAKE 1 TABLET BY MOUTH EVERY DAY Camnitz, Will Daphine Deutscher, MD Taking Active   cholecalciferol (VITAMIN D) 1000 UNITS tablet [id number] Yes Take 1,000 Units by mouth daily. [provider] Taking Active Self  cycloSPORINE (RESTASIS) 0.05 % ophthalmic emulsion [id number] Yes Place 1 drop into both eyes 2 (two) times daily. [provider] Taking Active Self  diltiazem (CARDIZEM CD) 240 MG 24 hr capsule [id number] Yes TAKE 1 CAPSULE BY MOUTH EVERY DAY Camnitz, Will Daphine Deutscher, MD Taking Active   ELIQUIS 5 MG TABS tablet [id number] Yes TAKE 1 TABLET BY MOUTH TWICE A DAY Camnitz, Will Daphine Deutscher, MD Taking Active   famotidine (PEPCID) 40 MG tablet [id number] Yes Take 1 tablet (40 mg total) by mouth at bedtime. Etta Grandchild, MD  Active   finasteride (PROSCAR) 5 MG tablet [id number] Yes TAKE 1 TABLET (5 MG TOTAL) BY MOUTH DAILY. Etta Grandchild, MD Taking Active   Fluorouracil 5 % SOLN [id number] Yes Apply topically. [provider] Taking Active   hydrALAZINE (APRESOLINE) 50 MG tablet [id number] Yes TAKE 1 TABLET BY MOUTH THREE TIMES A DAY Etta Grandchild, MD Taking Active   levothyroxine (SYNTHROID) 25 MCG tablet [id number] Yes TAKE 1 TABLET BY MOUTH EVERY DAY BEFORE BREAKFAST  Etta Grandchild, MD Taking Active   methylPREDNISolone (MEDROL DOSEPAK) 4 MG TBPK tablet [id number] Yes Take as directed by pharmacists; 6 day Madelyn Brunner, DO Taking Active   mirtazapine (REMERON) 7.5 MG tablet [id number] Yes TAKE 1 TABLET BY MOUTH AT BEDTIME. Etta Grandchild, MD Taking Active   montelukast (SINGULAIR)  10 MG tablet [id number] Yes TAKE 1 TABLET BY MOUTH EVERYDAY AT BEDTIME Etta Grandchild, MD Taking Active   Multiple Vitamins-Minerals (PRESERVISION AREDS PO) [id number] Yes Take 1 tablet by mouth daily. [provider] Taking Active Self  omeprazole (PRILOSEC) 40 MG capsule [id number] Yes TAKE 1 CAPSULE (40 MG TOTAL) BY MOUTH DAILY. Etta Grandchild, MD Taking Active   tamsulosin [facility]) 0.4 MG CAPS capsule [id number] Yes TAKE 1 CAPSULE BY MOUTH EVERY DAY Etta Grandchild, MD Taking Active   Tdap (BOOSTRIX) 5-2.5-18.5 LF-MCG/0.5 injection [id number] Yes Inject 0.5 mLs into the muscle once for 1 dose. Etta Grandchild, MD  Active   Tiotropium Bromide Monohydrate (SPIRIVA RESPIMAT) 2.5 MCG/ACT AERS [id number] Yes INHALE 2 PUFFS BY MOUTH INTO THE LUNGS DAILY Etta Grandchild, MD Taking Active            Assessment/Plan:   Medication Access/Adherence/Management -Suggested taking tamsulosin in the morning to prevent need to get up during the night to urinate -Patient plans to start taking bedtime medications an hour before bedtime to see if this increases sleep benefit of mirtazapine -Would recommend  to continue current administration technique for levothyroxine since thyroid hormones are stable -Recommended taking omeprazole first thing in the morning, 30 minutes before eating or having coffee.  Dr. Yetta Barre also added on an evening dose of famotidine 40mg  for esophagitis.  Hypertension: - Currently moderately controlled - Continue current regimen   Hyperlipidemia/ASCVD Risk Reduction: - Currently controlled.  - Continue current regimen  Follow Up Plan: Telephone visit 6/6 to see if any timing/administration changes to medications are helping  Roy Long, PharmD, DPLA

## 2022-08-27 ENCOUNTER — Other Ambulatory Visit: Payer: Self-pay | Admitting: Cardiology

## 2022-09-02 ENCOUNTER — Other Ambulatory Visit: Payer: Self-pay | Admitting: Cardiology

## 2022-09-02 ENCOUNTER — Other Ambulatory Visit: Payer: Medicare PPO

## 2022-09-02 NOTE — Progress Notes (Signed)
   09/02/2022  Patient ID: Roy Long, male   DOB: 07/05/1950, 72 y.o.   MRN: [id number]  Subjective/Objective: Telephone follow-up to address any medication questions/concerns patient may have after our last discussion and some changes made to administration.  Medication Management -Patient continues to take nighttime medications, including mirtazapine, about an hour before bedtime.  States medication is causing dry mouth, which also increases his throat irritation.  But the medication is helping with depression symptoms. -Started taking tamsulosin in the morning versus night and endorses only needing to get up to urinate approximately once per night now. -Reviewed timing of morning medications, including omeprazole and levothyroxine.  Patient is taking omeprazole first thing, levothyroxine approximately 30 minutes after.  Previously taking morning medications at the same time and not spacing out in regard to coffee/food, and thyroid levels were normal..  Omeprazole begins working within 1 hour and remains working 24hours+.  Therefore, I do not expect an impact on absorption of levothyroxine. -Patient states bedtime famotidine seems to be helping with throat irritation an esophagitis some. -He did mention a recent issue with his pharmacy acquiring diltiazem CD 240mg , but most recent communication from them states the medication is on order.   Assessment/Plan:  Medication Management -Patient to continue current regimen at this time -Maintain regular follow-up with PCP and other providers -Recommend f/u TSH and CMP at November PCP appointment -Patient will contact provider office if any needs prior to next appointment  Lenna Gilford, PharmD, DPLA

## 2022-09-08 ENCOUNTER — Other Ambulatory Visit: Payer: Self-pay | Admitting: Internal Medicine

## 2022-09-08 DIAGNOSIS — E039 Hypothyroidism, unspecified: Secondary | ICD-10-CM

## 2022-09-08 DIAGNOSIS — K219 Gastro-esophageal reflux disease without esophagitis: Secondary | ICD-10-CM

## 2022-09-08 DIAGNOSIS — N400 Enlarged prostate without lower urinary tract symptoms: Secondary | ICD-10-CM

## 2022-09-15 DIAGNOSIS — L820 Inflamed seborrheic keratosis: Secondary | ICD-10-CM | POA: Diagnosis not present

## 2022-09-15 DIAGNOSIS — C44519 Basal cell carcinoma of skin of other part of trunk: Secondary | ICD-10-CM | POA: Diagnosis not present

## 2022-09-15 DIAGNOSIS — Z85828 Personal history of other malignant neoplasm of skin: Secondary | ICD-10-CM | POA: Diagnosis not present

## 2022-09-15 DIAGNOSIS — L570 Actinic keratosis: Secondary | ICD-10-CM | POA: Diagnosis not present

## 2022-09-15 DIAGNOSIS — D485 Neoplasm of uncertain behavior of skin: Secondary | ICD-10-CM | POA: Diagnosis not present

## 2022-09-15 DIAGNOSIS — D692 Other nonthrombocytopenic purpura: Secondary | ICD-10-CM | POA: Diagnosis not present

## 2022-09-15 DIAGNOSIS — L814 Other melanin hyperpigmentation: Secondary | ICD-10-CM | POA: Diagnosis not present

## 2022-09-15 DIAGNOSIS — L821 Other seborrheic keratosis: Secondary | ICD-10-CM | POA: Diagnosis not present

## 2022-09-21 ENCOUNTER — Ambulatory Visit (INDEPENDENT_AMBULATORY_CARE_PROVIDER_SITE_OTHER): Payer: Medicare PPO

## 2022-09-21 VITALS — Ht 68.0 in | Wt 136.0 lb

## 2022-09-21 DIAGNOSIS — Z0000 Encounter for general adult medical examination without abnormal findings: Secondary | ICD-10-CM | POA: Diagnosis not present

## 2022-09-21 NOTE — Patient Instructions (Addendum)
Roy Long , Thank you for taking time to come for your Medicare Wellness Visit. I appreciate your ongoing commitment to your health goals. Please review the following plan we discussed and let me know if I can assist you in the future.   These are the goals we discussed:  Goals      My healthcare goal for 2024 is to not injure myself.        This is a list of the screening recommended for you and due dates:  Health Maintenance  Topic Date Due   COVID-19 Vaccine (8 - 2023-24 season) 11/27/2021   Flu Shot  10/28/2022   Medicare Annual Wellness Visit  09/21/2023   Colon Cancer Screening  10/02/2025   DTaP/Tdap/Td vaccine (4 - Td or Tdap) 08/01/2032   Pneumonia Vaccine  Completed   Hepatitis C Screening  Completed   Zoster (Shingles) Vaccine  Completed   HPV Vaccine  Aged Out    Advanced directives: Yes; Please bring a copy of your health care power of attorney and living will to the office at your convenience.  Conditions/risks identified: Yes  Next appointment: It was nice speaking with you today!  Please follow up in one year for your annual wellness visit via telephone call with Nurse Percell Miller on 09/28/2023 at 2:00 p.m.  If you need to cancel or reschedule please call [phone].  Preventive Care 36 Years and Older, Male  Preventive care refers to lifestyle choices and visits with your health care provider that can promote health and wellness. What does preventive care include? A yearly physical exam. This is also called an annual well check. Dental exams once or twice a year. Routine eye exams. Ask your health care provider how often you should have your eyes checked. Personal lifestyle choices, including: Daily care of your teeth and gums. Regular physical activity. Eating a healthy diet. Avoiding tobacco and drug use. Limiting alcohol use. Practicing safe sex. Taking low doses of aspirin every day. Taking vitamin and mineral supplements as recommended by your health  care provider. What happens during an annual well check? The services and screenings done by your health care provider during your annual well check will depend on your age, overall health, lifestyle risk factors, and family history of disease. Counseling  Your health care provider may ask you questions about your: Alcohol use. Tobacco use. Drug use. Emotional well-being. Home and relationship well-being. Sexual activity. Eating habits. History of falls. Memory and ability to understand (cognition). Work and work Astronomer. Screening  You may have the following tests or measurements: Height, weight, and BMI. Blood pressure. Lipid and cholesterol levels. These may be checked every 5 years, or more frequently if you are over 76 years old. Skin check. Lung cancer screening. You may have this screening every year starting at age 31 if you have a 30-pack-year history of smoking and currently smoke or have quit within the past 15 years. Fecal occult blood test (FOBT) of the stool. You may have this test every year starting at age 75. Flexible sigmoidoscopy or colonoscopy. You may have a sigmoidoscopy every 5 years or a colonoscopy every 10 years starting at age 27. Prostate cancer screening. Recommendations will vary depending on your family history and other risks. Hepatitis C blood test. Hepatitis B blood test. Sexually transmitted disease (STD) testing. Diabetes screening. This is done by checking your blood sugar (glucose) after you have not eaten for a while (fasting). You may have this done every 1-3 years.  Abdominal aortic aneurysm (AAA) screening. You may need this if you are a current or former smoker. Osteoporosis. You may be screened starting at age 44 if you are at high risk. Talk with your health care provider about your test results, treatment options, and if necessary, the need for more tests. Vaccines  Your health care provider may recommend certain vaccines, such  as: Influenza vaccine. This is recommended every year. Tetanus, diphtheria, and acellular pertussis (Tdap, Td) vaccine. You may need a Td booster every 10 years. Zoster vaccine. You may need this after age 61. Pneumococcal 13-valent conjugate (PCV13) vaccine. One dose is recommended after age 73. Pneumococcal polysaccharide (PPSV23) vaccine. One dose is recommended after age 82. Talk to your health care provider about which screenings and vaccines you need and how often you need them. This information is not intended to replace advice given to you by your health care provider. Make sure you discuss any questions you have with your health care provider. Document Released: 04/11/2015 Document Revised: 12/03/2015 Document Reviewed: 01/14/2015 Elsevier Interactive Patient Education  2017 [employer] Prevention in the Home Falls can cause injuries. They can happen to people of all ages. There are many things you can do to make your home safe and to help prevent falls. What can I do on the outside of my home? Regularly fix the edges of walkways and driveways and fix any cracks. Remove anything that might make you trip as you walk through a door, such as a raised step or threshold. Trim any bushes or trees on the path to your home. Use bright outdoor lighting. Clear any walking paths of anything that might make someone trip, such as rocks or tools. Regularly check to see if handrails are loose or broken. Make sure that both sides of any steps have handrails. Any raised decks and porches should have guardrails on the edges. Have any leaves, snow, or ice cleared regularly. Use sand or salt on walking paths during winter. Clean up any spills in your garage right away. This includes oil or grease spills. What can I do in the bathroom? Use night lights. Install grab bars by the toilet and in the tub and shower. Do not use towel bars as grab bars. Use non-skid mats or decals in the tub or  shower. If you need to sit down in the shower, use a plastic, non-slip stool. Keep the floor dry. Clean up any water that spills on the floor as soon as it happens. Remove soap buildup in the tub or shower regularly. Attach bath mats securely with double-sided non-slip rug tape. Do not have throw rugs and other things on the floor that can make you trip. What can I do in the bedroom? Use night lights. Make sure that you have a light by your bed that is easy to reach. Do not use any sheets or blankets that are too big for your bed. They should not hang down onto the floor. Have a firm chair that has side arms. You can use this for support while you get dressed. Do not have throw rugs and other things on the floor that can make you trip. What can I do in the kitchen? Clean up any spills right away. Avoid walking on wet floors. Keep items that you use a lot in easy-to-reach places. If you need to reach something above you, use a strong step stool that has a grab bar. Keep electrical cords out of the way. Do not  use floor polish or wax that makes floors slippery. If you must use wax, use non-skid floor wax. Do not have throw rugs and other things on the floor that can make you trip. What can I do with my stairs? Do not leave any items on the stairs. Make sure that there are handrails on both sides of the stairs and use them. Fix handrails that are broken or loose. Make sure that handrails are as long as the stairways. Check any carpeting to make sure that it is firmly attached to the stairs. Fix any carpet that is loose or worn. Avoid having throw rugs at the top or bottom of the stairs. If you do have throw rugs, attach them to the floor with carpet tape. Make sure that you have a light switch at the top of the stairs and the bottom of the stairs. If you do not have them, ask someone to add them for you. What else can I do to help prevent falls? Wear shoes that: Do not have high heels. Have  rubber bottoms. Are comfortable and fit you well. Are closed at the toe. Do not wear sandals. If you use a stepladder: Make sure that it is fully opened. Do not climb a closed stepladder. Make sure that both sides of the stepladder are locked into place. Ask someone to hold it for you, if possible. Clearly mark and make sure that you can see: Any grab bars or handrails. First and last steps. Where the edge of each step is. Use tools that help you move around (mobility aids) if they are needed. These include: Canes. Walkers. Scooters. Crutches. Turn on the lights when you go into a dark area. Replace any light bulbs as soon as they burn out. Set up your furniture so you have a clear path. Avoid moving your furniture around. If any of your floors are uneven, fix them. If there are any pets around you, be aware of where they are. Review your medicines with your doctor. Some medicines can make you feel dizzy. This can increase your chance of falling. Ask your doctor what other things that you can do to help prevent falls. This information is not intended to replace advice given to you by your health care provider. Make sure you discuss any questions you have with your health care provider. Document Released: 01/09/2009 Document Revised: 08/21/2015 Document Reviewed: 04/19/2014 Elsevier Interactive Patient Education  2017 [employer].

## 2022-09-21 NOTE — Progress Notes (Signed)
Subjective:   Roy Long is a 72 y.o. male who presents for Medicare Annual/Subsequent preventive examination.  Visit Complete: Virtual  I connected with  [name] on 09/21/22 by a audio enabled telemedicine application and verified that I am speaking with the correct person using two identifiers.  Patient Location: Home  Provider Location: Office/Clinic  I discussed the limitations of evaluation and management by telemedicine. The patient expressed understanding and agreed to proceed.  Patient Medicare AWV questionnaire was completed by the patient on 09/20/2022; I have confirmed that all information answered by patient is correct and no changes since this date.  Review of Systems     Cardiac Risk Factors include: advanced age (>59men, >73 women);dyslipidemia;family history of premature cardiovascular disease;hypertension;male gender     Objective:    Today's Vitals   09/21/22 1358 09/21/22 1359  Weight: 136 lb (61.7 kg)   Height: 5\' 8"  (1.727 m)   PainSc: 0-No pain 0-No pain   Body mass index is 20.68 kg/m.     09/21/2022    2:10 PM 03/17/2022    3:47 PM 10/01/2021   10:06 AM 04/25/2018   10:18 AM 09/27/2016    2:18 PM 04/12/2016    9:19 AM 03/24/2016    8:01 PM  Advanced Directives  Does Patient Have a Medical Advance Directive? Yes Yes Yes Yes Yes No Yes  Type of Estate agent of [place];Living will Healthcare Power of [place];Living will Living will;Healthcare Power of [employer] Power of [place];Living will   Healthcare Power of [place];Living will  Does patient want to make changes to medical advance directive?   No - Patient declined Yes (ED - Information included in AVS)     Copy of Healthcare Power of Attorney in Chart? No - copy requested No - copy requested No - copy requested Yes - validated most recent copy scanned in chart (See row information)   Yes  Would patient like information on creating a medical advance  directive?      No - Patient declined     Current Medications (verified) Outpatient Encounter Medications as of 09/21/2022  Medication Sig   acyclovir (ZOVIRAX) 400 MG tablet TAKE 1 TABLET BY MOUTH EVERY DAY   atorvastatin (LIPITOR) 80 MG tablet TAKE 1 TABLET BY MOUTH EVERY DAY   cholecalciferol (VITAMIN D) 1000 UNITS tablet Take 1,000 Units by mouth daily.   cycloSPORINE (RESTASIS) 0.05 % ophthalmic emulsion Place 1 drop into both eyes 2 (two) times daily.   diltiazem (CARDIZEM CD) 240 MG 24 hr capsule TAKE 1 CAPSULE BY MOUTH EVERY DAY   ELIQUIS 5 MG TABS tablet TAKE 1 TABLET BY MOUTH TWICE A DAY   famotidine (PEPCID) 40 MG tablet Take 1 tablet (40 mg total) by mouth at bedtime.   finasteride (PROSCAR) 5 MG tablet Take 1 tablet (5 mg total) by mouth daily.   Fluorouracil 5 % SOLN Apply topically. (Patient not taking: Reported on 08/03/2022)   hydrALAZINE (APRESOLINE) 50 MG tablet TAKE 1 TABLET BY MOUTH THREE TIMES A DAY   levothyroxine (SYNTHROID) 25 MCG tablet TAKE 1 TABLET BY MOUTH EVERY DAY BEFORE BREAKFAST   mirtazapine (REMERON) 7.5 MG tablet Take 1 tablet (7.5 mg total) by mouth at bedtime.   montelukast (SINGULAIR) 10 MG tablet TAKE 1 TABLET BY MOUTH EVERYDAY AT BEDTIME   Multiple Vitamins-Minerals (PRESERVISION AREDS PO) Take 1 tablet by mouth daily.   omeprazole (PRILOSEC) 40 MG capsule TAKE 1 CAPSULE (40 MG TOTAL) BY MOUTH  DAILY.   tamsulosin (FLOMAX) 0.4 MG CAPS capsule TAKE 1 CAPSULE BY MOUTH EVERY DAY   Tiotropium Bromide Monohydrate (SPIRIVA RESPIMAT) 2.5 MCG/ACT AERS INHALE 2 PUFFS BY MOUTH INTO THE LUNGS DAILY   Vitamins-Lipotropics (LIPOFLAVONOID) TABS Take 1 tablet by mouth in the morning, at noon, and at bedtime.   No facility-administered encounter medications on file as of 09/21/2022.    Allergies (verified) Pneumococcal vaccines, Tape, and Amlodipine   History: Past Medical History:  Diagnosis Date   Age-related macular degeneration, dry, left eye    Alcohol  dependence (HCC)    Arthritis    "maybe a little bit in my left knee and right forefinger" (05/30/2015)   Atrial fibrillation (HCC)    ablation 05-2015 with no reoccurance as of 09-2015   Basal cell carcinoma    Depression    hx   Family history of adverse reaction to anesthesia    "father had head injuries and dementia; everytime he was put under less of his mentation came back"   Gastroparesis    GERD (gastroesophageal reflux disease)    History of kidney stones    Hypercholesterolemia dx'd 02/2015   Hypertension    Hypothyroidism    Kidney stones    Migraine aura without headache    "very rare now" (05/30/2015)   Pneumonia ~ 2004   Squamous carcinoma    hand, chest   Past Surgical History:  Procedure Laterality Date   ANTERIOR CERVICAL DECOMP/DISCECTOMY FUSION  04/2009   ATRIAL FIBRILLATION ABLATION  05/30/2015   BACK SURGERY     BASAL CELL CARCINOMA EXCISION     chest or back or left arm   CARDIOVERSION  06/2002   CATARACT EXTRACTION W/ INTRAOCULAR LENS IMPLANT Left ~ 2012   COLONOSCOPY     COLONOSCOPY W/ POLYPECTOMY  02/2003   CYSTOSCOPY W/ STONE MANIPULATION  1980's   "basketted it out; no stent"   ELECTROPHYSIOLOGIC STUDY N/A 05/30/2015   Procedure: Atrial Fibrillation Ablation;  Surgeon: Will Jorja Loa, MD;  Location: [facility];  Service: Cardiovascular;  Laterality: N/A;   EXTRACORPOREAL SHOCK WAVE LITHOTRIPSY Left 04/12/2016   Procedure: LEFT EXTRACORPOREAL SHOCK WAVE LITHOTRIPSY (ESWL);  Surgeon: Bjorn Pippin, MD;  Location: [facility];  Service: Urology;  Laterality: Left;   INGUINAL HERNIA REPAIR Bilateral 2004   INGUINAL HERNIA REPAIR Bilateral 2004   MOHS SURGERY Right    "temple"   SQUAMOUS CELL CARCINOMA EXCISION     chest or back or left arm   Family History  Problem Relation Age of Onset   Stroke Brother    Cancer Brother        basosquamous cell carcinoma-head   Heart failure Mother    Colon cancer Cousin 50   Heart disease Neg Hx     Hyperlipidemia Neg Hx    Hypertension Neg Hx    Diabetes Neg Hx    Social History   Socioeconomic History   Marital status: Single    Spouse name: Not on file   Number of children: 0   Years of education: Not on file   Highest education level: Master's degree (e.g., MA, MS, MEng, MEd, MSW, MBA)  Occupational History   Occupation: band Runner, broadcasting/film/video    Comment: retired  Tobacco Use   Smoking status: Former    Packs/day: 3.00    Years: 17.00    Additional pack years: 0.00    Total pack years: 51.00    Types: Cigarettes    Quit date: 03/30/1983  Years since quitting: 39.5    Passive exposure: Never   Smokeless tobacco: Never  Vaping Use   Vaping Use: Never used  Substance and Sexual Activity   Alcohol use: No    Alcohol/week: 0.0 standard drinks of alcohol    Comment: 05/30/2015 "recovering alcoholic; dry date 04/19/2011"   Drug use: No    Types: Marijuana, LSD, Mescaline    Comment: 03/07/2015 "stopped all drugs back in the 1980's"   Sexual activity: Not Currently    Birth control/protection: None  Other Topics Concern   Not on file  Social History Narrative   Not on file   Social Determinants of Health   Financial Resource Strain: Low Risk  (09/21/2022)   Overall Financial Resource Strain (CARDIA)    Difficulty of Paying Living Expenses: Not hard at all  Food Insecurity: No Food Insecurity (09/21/2022)   Hunger Vital Sign    Worried About Running Out of Food in the Last Year: Never true    Ran Out of Food in the Last Year: Never true  Transportation Needs: No Transportation Needs (09/21/2022)   PRAPARE - Administrator, Civil Service (Medical): No    Lack of Transportation (Non-Medical): No  Physical Activity: Sufficiently Active (09/21/2022)   Exercise Vital Sign    Days of Exercise per Week: 7 days    Minutes of Exercise per Session: 50 min  Stress: No Stress Concern Present (09/21/2022)   [employer] of Occupational Health - Occupational Stress  Questionnaire    Feeling of Stress : Only a little  Social Connections: Moderately Isolated (09/21/2022)   Social Connection and Isolation Panel [NHANES]    Frequency of Communication with Friends and Family: More than three times a week    Frequency of Social Gatherings with Friends and Family: Never    Attends Religious Services: Never    Database administrator or Organizations: Yes    Attends Engineer, structural: More than 4 times per year    Marital Status: Never married    Tobacco Counseling Counseling given: Not Answered   Clinical Intake:  Pre-visit preparation completed: Yes  Pain : No/denies pain Pain Score: 0-No pain     BMI - recorded: 20.68 Nutritional Status: BMI of 19-24  Normal Nutritional Risks: None Diabetes: No  How often do you need to have someone help you when you read instructions, pamphlets, or other written materials from your doctor or pharmacy?: 1 - Never What is the last grade level you completed in school?: [employer]; Retired Pharmacist, hospital Needed?: No  Information entered by :: [employer]. Kailey Esquilin, LPN.   Activities of Daily Living    09/21/2022    2:11 PM 09/20/2022    2:36 PM  In your present state of health, do you have any difficulty performing the following activities:  Hearing? 0 0  Vision? 0 0  Difficulty concentrating or making decisions? 0 0  Walking or climbing stairs? 0 0  Dressing or bathing? 0 0  Doing errands, shopping? 0 0  Preparing Food and eating ? N N  Using the Toilet? N N  In the past six months, have you accidently leaked urine? N N  Do you have problems with loss of bowel control? N N  Managing your Medications? N N  Managing your Finances? N N  Housekeeping or managing your Housekeeping? N N    Patient Care Team: Etta Grandchild, MD as PCP - General (Internal Medicine) Elberta Fortis, Will  Daphine Deutscher, MD as PCP - Electrophysiology (Cardiology) Mia Creek, MD as Consulting Physician  (Ophthalmology)  Indicate any recent Medical Services you may have received from other than [facility] providers in the past year (date may be approximate).     Assessment:   This is a routine wellness examination for Kionte.  Hearing/Vision screen Hearing Screening - Comments:: Patient denied any hearing difficulties. No hearing aids. Vision Screening - Comments:: Wears rx glasses - up to date with routine eye exams with Mia Creek, MD.   Dietary issues and exercise activities discussed:     Goals Addressed             This Visit's Progress    My healthcare goal for 2024 is to not injure myself.        Depression Screen    09/21/2022    2:07 PM 08/02/2022   10:04 AM 01/12/2022    1:19 PM 10/01/2021   10:11 AM 07/13/2021    1:41 PM 10/06/2020    2:50 PM 04/18/2020    1:01 PM  PHQ 2/9 Scores  PHQ - 2 Score 0 0 2 2 2 1  0  PHQ- 9 Score 4 5 9 4 2 6  0    Fall Risk    09/21/2022    2:00 PM 09/20/2022    2:36 PM 10/01/2021   10:06 AM 10/06/2020    2:52 PM 06/08/2019   10:24 AM  Fall Risk   Falls in the past year? 1 1 0 0 0  Number falls in past yr: 0 0 0 0   Injury with Fall? 0 0 0 0   Risk for fall due to : No Fall Risks  No Fall Risks    Follow up Falls prevention discussed;Education provided;Falls evaluation completed  Falls evaluation completed      MEDICARE RISK AT HOME:  Medicare Risk at Home - 09/21/22 1412     Any stairs in or around the home? Yes    If so, are there any without handrails? Yes    Home free of loose throw rugs in walkways, pet beds, electrical cords, etc? No    Adequate lighting in your home to reduce risk of falls? Yes    Life alert? No    Use of a cane, walker or w/c? No    Grab bars in the bathroom? Yes    Shower chair or bench in shower? Yes    Elevated toilet seat or a handicapped toilet? No             TIMED UP AND GO:  Was the test performed?  No    Cognitive Function:        09/21/2022    2:00 PM 10/01/2021   10:15 AM  6CIT  Screen  What Year? 0 points 0 points  What month? 0 points 0 points  What time? 0 points 0 points  Count back from 20 0 points 0 points  Months in reverse 0 points 0 points  Repeat phrase 0 points 0 points  Total Score 0 points 0 points    Immunizations Immunization History  Administered Date(s) Administered   DTaP 11/16/2006   Influenza Split 01/18/2012   Influenza, High Dose Seasonal PF 12/28/2013, 11/17/2016, 11/16/2018, 12/19/2019, 12/03/2020, 12/09/2021   Influenza,trivalent, recombinat, inj, PF 11/16/2017   Influenza-Unspecified 01/07/2016, 12/19/2019, 12/04/2020   PFIZER Comirnaty(Gray Top)Covid-19 Tri-Sucrose Vaccine 06/27/2020   PFIZER(Purple Top)SARS-COV-2 Vaccination 05/10/2019, 06/04/2019, 01/08/2020, 06/27/2020, 12/10/2020   PNEUMOCOCCAL CONJUGATE-20 01/07/2021   Pfizer Covid-19  Vaccine Bivalent Booster 63yrs & up 08/04/2021   Pneumococcal Polysaccharide-23 04/07/2011   Tdap 02/16/2012, 08/02/2022   Zoster Recombinat (Shingrix) 10/17/2020, 03/31/2021   Zoster, Live 05/04/2011    TDAP status: Up to date  Flu Vaccine status: Up to date  Pneumococcal vaccine status: Up to date  Covid-19 vaccine status: Completed vaccines  Qualifies for Shingles Vaccine? Yes   Zostavax completed Yes   Shingrix Completed?: Yes  Screening Tests Health Maintenance  Topic Date Due   COVID-19 Vaccine (8 - 2023-24 season) 11/27/2021   INFLUENZA VACCINE  10/28/2022   Medicare Annual Wellness (AWV)  09/21/2023   Colonoscopy  10/02/2025   DTaP/Tdap/Td (4 - Td or Tdap) 08/01/2032   Pneumonia Vaccine 53+ Years old  Completed   Hepatitis C Screening  Completed   Zoster Vaccines- Shingrix  Completed   HPV VACCINES  Aged Out    Health Maintenance  Health Maintenance Due  Topic Date Due   COVID-19 Vaccine (8 - 2023-24 season) 11/27/2021    Colorectal cancer screening: Type of screening: Colonoscopy. Completed 10/03/2015. Repeat every 10 years  Lung Cancer Screening: (Low Dose CT  Chest recommended if Age 44-80 years, 20 pack-year currently smoking OR have quit w/in 15years.) does not qualify.   Lung Cancer Screening Referral: no  Additional Screening:  Hepatitis C Screening: does qualify; Completed 10/24/2013  Vision Screening: Recommended annual ophthalmology exams for early detection of glaucoma and other disorders of the eye. Is the patient up to date with their annual eye exam?  Yes  Who is the provider or what is the name of the office in which the patient attends annual eye exams? Mia Creek, MD. If pt is not established with a provider, would they like to be referred to a provider to establish care? No .   Dental Screening: Recommended annual dental exams for proper oral hygiene  Diabetic Foot Exam: N/A  Community Resource Referral / Chronic Care Management: CRR required this visit?  No   CCM required this visit?  No     Plan:     I have personally reviewed and noted the following in the patient's chart:   Medical and social history Use of alcohol, tobacco or illicit drugs  Current medications and supplements including opioid prescriptions. Patient is not currently taking opioid prescriptions. Functional ability and status Nutritional status Physical activity Advanced directives List of other physicians Hospitalizations, surgeries, and ER visits in previous 12 months Vitals Screenings to include cognitive, depression, and falls Referrals and appointments  In addition, I have reviewed and discussed with patient certain preventive protocols, quality metrics, and best practice recommendations. A written personalized care plan for preventive services as well as general preventive health recommendations were provided to patient.     Mickeal Needy, LPN   [id number]   After Visit Summary: (Mail) Due to this being a telephonic visit, the after visit summary with patients personalized plan was offered to patient via mail   Nurse Notes:  Normal cognitive status assessed by direct observation via telephone conversation by this Nurse Health Advisor. No abnormalities found.

## 2022-09-29 DIAGNOSIS — R350 Frequency of micturition: Secondary | ICD-10-CM | POA: Diagnosis not present

## 2022-09-29 DIAGNOSIS — N200 Calculus of kidney: Secondary | ICD-10-CM | POA: Diagnosis not present

## 2022-09-29 DIAGNOSIS — R3121 Asymptomatic microscopic hematuria: Secondary | ICD-10-CM | POA: Diagnosis not present

## 2022-09-29 DIAGNOSIS — N401 Enlarged prostate with lower urinary tract symptoms: Secondary | ICD-10-CM | POA: Diagnosis not present

## 2022-10-10 ENCOUNTER — Other Ambulatory Visit: Payer: Self-pay | Admitting: Internal Medicine

## 2022-10-10 ENCOUNTER — Other Ambulatory Visit: Payer: Self-pay | Admitting: Cardiology

## 2022-10-10 DIAGNOSIS — I10 Essential (primary) hypertension: Secondary | ICD-10-CM

## 2022-10-10 DIAGNOSIS — I2510 Atherosclerotic heart disease of native coronary artery without angina pectoris: Secondary | ICD-10-CM

## 2022-10-11 NOTE — Telephone Encounter (Signed)
Prescription refill request for Eliquis received. Indication:afib Last office visit:4/24 Scr:0.85  5/24 Age: 72 Weight:61.7  kg  Prescription refilled

## 2022-10-21 ENCOUNTER — Other Ambulatory Visit: Payer: Self-pay | Admitting: Internal Medicine

## 2022-10-21 DIAGNOSIS — J431 Panlobular emphysema: Secondary | ICD-10-CM

## 2022-10-31 ENCOUNTER — Other Ambulatory Visit: Payer: Self-pay | Admitting: Internal Medicine

## 2022-10-31 DIAGNOSIS — F331 Major depressive disorder, recurrent, moderate: Secondary | ICD-10-CM

## 2022-10-31 DIAGNOSIS — A6000 Herpesviral infection of urogenital system, unspecified: Secondary | ICD-10-CM

## 2022-11-05 ENCOUNTER — Other Ambulatory Visit: Payer: Self-pay | Admitting: Internal Medicine

## 2022-12-17 DIAGNOSIS — C44629 Squamous cell carcinoma of skin of left upper limb, including shoulder: Secondary | ICD-10-CM | POA: Diagnosis not present

## 2022-12-17 DIAGNOSIS — Z85828 Personal history of other malignant neoplasm of skin: Secondary | ICD-10-CM | POA: Diagnosis not present

## 2022-12-19 ENCOUNTER — Other Ambulatory Visit: Payer: Self-pay | Admitting: Internal Medicine

## 2022-12-19 DIAGNOSIS — E039 Hypothyroidism, unspecified: Secondary | ICD-10-CM

## 2022-12-19 DIAGNOSIS — N400 Enlarged prostate without lower urinary tract symptoms: Secondary | ICD-10-CM

## 2022-12-20 ENCOUNTER — Other Ambulatory Visit: Payer: Self-pay | Admitting: Internal Medicine

## 2022-12-20 DIAGNOSIS — K219 Gastro-esophageal reflux disease without esophagitis: Secondary | ICD-10-CM

## 2022-12-31 ENCOUNTER — Other Ambulatory Visit: Payer: Self-pay | Admitting: Cardiology

## 2022-12-31 DIAGNOSIS — E785 Hyperlipidemia, unspecified: Secondary | ICD-10-CM

## 2023-01-04 DIAGNOSIS — Z85828 Personal history of other malignant neoplasm of skin: Secondary | ICD-10-CM | POA: Diagnosis not present

## 2023-01-04 DIAGNOSIS — C44629 Squamous cell carcinoma of skin of left upper limb, including shoulder: Secondary | ICD-10-CM | POA: Diagnosis not present

## 2023-01-14 ENCOUNTER — Other Ambulatory Visit: Payer: Self-pay | Admitting: Internal Medicine

## 2023-01-14 DIAGNOSIS — K2100 Gastro-esophageal reflux disease with esophagitis, without bleeding: Secondary | ICD-10-CM

## 2023-01-14 DIAGNOSIS — K219 Gastro-esophageal reflux disease without esophagitis: Secondary | ICD-10-CM

## 2023-01-18 DIAGNOSIS — L570 Actinic keratosis: Secondary | ICD-10-CM | POA: Diagnosis not present

## 2023-01-19 ENCOUNTER — Other Ambulatory Visit: Payer: Self-pay | Admitting: Internal Medicine

## 2023-01-19 DIAGNOSIS — F331 Major depressive disorder, recurrent, moderate: Secondary | ICD-10-CM

## 2023-01-19 DIAGNOSIS — J301 Allergic rhinitis due to pollen: Secondary | ICD-10-CM

## 2023-02-03 ENCOUNTER — Encounter: Payer: Self-pay | Admitting: Internal Medicine

## 2023-02-03 ENCOUNTER — Ambulatory Visit: Payer: Medicare PPO | Admitting: Internal Medicine

## 2023-02-03 VITALS — BP 134/66 | HR 77 | Temp 97.6°F | Ht 68.0 in | Wt 133.8 lb

## 2023-02-03 DIAGNOSIS — N400 Enlarged prostate without lower urinary tract symptoms: Secondary | ICD-10-CM

## 2023-02-03 DIAGNOSIS — M20012 Mallet finger of left finger(s): Secondary | ICD-10-CM | POA: Diagnosis not present

## 2023-02-03 DIAGNOSIS — R0609 Other forms of dyspnea: Secondary | ICD-10-CM | POA: Diagnosis not present

## 2023-02-03 DIAGNOSIS — R9431 Abnormal electrocardiogram [ECG] [EKG]: Secondary | ICD-10-CM

## 2023-02-03 DIAGNOSIS — I484 Atypical atrial flutter: Secondary | ICD-10-CM

## 2023-02-03 DIAGNOSIS — E785 Hyperlipidemia, unspecified: Secondary | ICD-10-CM

## 2023-02-03 DIAGNOSIS — E039 Hypothyroidism, unspecified: Secondary | ICD-10-CM

## 2023-02-03 DIAGNOSIS — I10 Essential (primary) hypertension: Secondary | ICD-10-CM | POA: Diagnosis not present

## 2023-02-03 DIAGNOSIS — I2510 Atherosclerotic heart disease of native coronary artery without angina pectoris: Secondary | ICD-10-CM

## 2023-02-03 DIAGNOSIS — Z0001 Encounter for general adult medical examination with abnormal findings: Secondary | ICD-10-CM

## 2023-02-03 LAB — LIPID PANEL
Cholesterol: 98 mg/dL (ref 0–200)
HDL: 40.6 mg/dL (ref >39.00)
LDL Cholesterol: 38 mg/dL (ref 0–99)
NonHDL: 57.67
Total CHOL/HDL Ratio: 2
Triglycerides: 99 mg/dL (ref 0.0–149.0)
VLDL: 19.8 mg/dL (ref 0.0–40.0)

## 2023-02-03 LAB — TROPONIN I (HIGH SENSITIVITY): High Sens Troponin I: 3 ng/L (ref 2–17)

## 2023-02-03 LAB — HEPATIC FUNCTION PANEL
ALT: 17 U/L (ref 0–53)
AST: 18 U/L (ref 0–37)
Albumin: 4.5 g/dL (ref 3.5–5.2)
Alkaline Phosphatase: 84 U/L (ref 39–117)
Bilirubin, Direct: 0.2 mg/dL (ref 0.0–0.3)
Total Bilirubin: 0.8 mg/dL (ref 0.2–1.2)
Total Protein: 6.7 g/dL (ref 6.0–8.3)

## 2023-02-03 LAB — PSA: PSA: 0.21 ng/mL (ref 0.10–4.00)

## 2023-02-03 LAB — BRAIN NATRIURETIC PEPTIDE: Pro B Natriuretic peptide (BNP): 17 pg/mL (ref 0.0–100.0)

## 2023-02-03 LAB — TSH: TSH: 3.98 u[IU]/mL (ref 0.35–5.50)

## 2023-02-03 NOTE — Patient Instructions (Signed)
Health Maintenance, Male Adopting a healthy lifestyle and getting preventive care are important in promoting health and wellness. Ask your health care provider about: The right schedule for you to have regular tests and exams. Things you can do on your own to prevent diseases and keep yourself healthy. What should I know about diet, weight, and exercise? Eat a healthy diet  Eat a diet that includes plenty of vegetables, fruits, low-fat dairy products, and lean protein. Do not eat a lot of foods that are high in solid fats, added sugars, or sodium. Maintain a healthy weight Body mass index (BMI) is a measurement that can be used to identify possible weight problems. It estimates body fat based on height and weight. Your health care provider can help determine your BMI and help you achieve or maintain a healthy weight. Get regular exercise Get regular exercise. This is one of the most important things you can do for your health. Most adults should: Exercise for at least 150 minutes each week. The exercise should increase your heart rate and make you sweat (moderate-intensity exercise). Do strengthening exercises at least twice a week. This is in addition to the moderate-intensity exercise. Spend less time sitting. Even light physical activity can be beneficial. Watch cholesterol and blood lipids Have your blood tested for lipids and cholesterol at 74 years of age, then have this test every 5 years. You may need to have your cholesterol levels checked more often if: Your lipid or cholesterol levels are high. You are older than 74 years of age. You are at high risk for heart disease. What should I know about cancer screening? Many types of cancers can be detected early and may often be prevented. Depending on your health history and family history, you may need to have cancer screening at various ages. This may include screening for: Colorectal cancer. Prostate cancer. Skin cancer. Lung  cancer. What should I know about heart disease, diabetes, and high blood pressure? Blood pressure and heart disease High blood pressure causes heart disease and increases the risk of stroke. This is more likely to develop in people who have high blood pressure readings or are overweight. Talk with your health care provider about your target blood pressure readings. Have your blood pressure checked: Every 3-5 years if you are 18-39 years of age. Every year if you are 40 years old or older. If you are between the ages of 65 and 75 and are a current or former smoker, ask your health care provider if you should have a one-time screening for abdominal aortic aneurysm (AAA). Diabetes Have regular diabetes screenings. This checks your fasting blood sugar level. Have the screening done: Once every three years after age 45 if you are at a normal weight and have a low risk for diabetes. More often and at a younger age if you are overweight or have a high risk for diabetes. What should I know about preventing infection? Hepatitis B If you have a higher risk for hepatitis B, you should be screened for this virus. Talk with your health care provider to find out if you are at risk for hepatitis B infection. Hepatitis C Blood testing is recommended for: Everyone born from 1945 through 1965. Anyone with known risk factors for hepatitis C. Sexually transmitted infections (STIs) You should be screened each year for STIs, including gonorrhea and chlamydia, if: You are sexually active and are younger than 74 years of age. You are older than 74 years of age and your   health care provider tells you that you are at risk for this type of infection. Your sexual activity has changed since you were last screened, and you are at increased risk for chlamydia or gonorrhea. Ask your health care provider if you are at risk. Ask your health care provider about whether you are at high risk for HIV. Your health care provider  may recommend a prescription medicine to help prevent HIV infection. If you choose to take medicine to prevent HIV, you should first get tested for HIV. You should then be tested every 3 months for as long as you are taking the medicine. Follow these instructions at home: Alcohol use Do not drink alcohol if your health care provider tells you not to drink. If you drink alcohol: Limit how much you have to 0-2 drinks a day. Know how much alcohol is in your drink. In the [place] one drink equals one 12 oz bottle of beer (355 mL), one 5 oz glass of wine (148 mL), or one 1 oz glass of hard liquor (44 mL). Lifestyle Do not use any products that contain nicotine or tobacco. These products include cigarettes, chewing tobacco, and vaping devices, such as e-cigarettes. If you need help quitting, ask your health care provider. Do not use street drugs. Do not share needles. Ask your health care provider for help if you need support or information about quitting drugs. General instructions Schedule regular health, dental, and eye exams. Stay current with your vaccines. Tell your health care provider if: You often feel depressed. You have ever been abused or do not feel safe at home. Summary Adopting a healthy lifestyle and getting preventive care are important in promoting health and wellness. Follow your health care provider's instructions about healthy diet, exercising, and getting tested or screened for diseases. Follow your health care provider's instructions on monitoring your cholesterol and blood pressure. This information is not intended to replace advice given to you by your health care provider. Make sure you discuss any questions you have with your health care provider. Document Revised: 08/04/2020 Document Reviewed: 08/04/2020 Elsevier Patient Education  2024 [employer]  

## 2023-02-03 NOTE — Progress Notes (Signed)
Subjective:  Patient ID: Roy Long, male    DOB: 1951/03/10  Age: 72 y.o. MRN: [id number]  CC: Annual Exam and Hypothyroidism   HPI Roy Long presents for a CPX and f/up -----  Discussed the use of AI scribe software for clinical note transcription with the patient, who gave verbal consent to proceed.  History of Present Illness   The patient, with a history of atrial fib/flutter and thyroid disease, presents with two primary concerns. Firstly, they report a persistent issue with their left hand, particularly the index finger, following a fall a couple of months ago. They describe difficulty with range of motion and a sensation of something obstructing the finger from bending fully. The patient also notes a similar deformity in the left index finger, but it is less bothersome. They express reluctance to immobilize the hand due to previous experience with hand immobilization following a cancer removal.  Secondly, the patient reports fatigue, which they initially attributed to a change in medication timing as advised by a pharmacist. They were taking omeprazole and levothyroxine separately, with a half-hour gap between each and another half-hour before eating or drinking. However, this regimen seemed to exacerbate their fatigue. They have since returned to taking the medications together first thing in the morning, which has somewhat improved the fatigue, though it is not completely resolved.  The patient also mentions shortness of breath upon exertion, particularly when walking fast or uphill. This is not a new symptom and is somewhat managed with Spiriva. They deny any chest pain during these episodes. They also report occasional foot pain, particularly upon standing after a period of rest. This started a year ago and has improved to some extent with the use of a brace.  Lastly, the patient reports frequent throat clearing and a sensation of something getting lodged in their throat,  which they believe to be nasal drainage. They deny any difficulty or pain with swallowing and have no history of heartburn. They are currently on omeprazole and mirtazapine, the latter of which they note causes dry mouth.       Outpatient Medications Prior to Visit  Medication Sig Dispense Refill   acyclovir (ZOVIRAX) 400 MG tablet TAKE 1 TABLET BY MOUTH EVERY DAY 90 tablet 1   atorvastatin (LIPITOR) 80 MG tablet TAKE 1 TABLET BY MOUTH EVERY DAY 90 tablet 3   cholecalciferol (VITAMIN D) 1000 UNITS tablet Take 1,000 Units by mouth daily.     cycloSPORINE (RESTASIS) 0.05 % ophthalmic emulsion Place 1 drop into both eyes 2 (two) times daily.     diltiazem (CARDIZEM CD) 240 MG 24 hr capsule TAKE 1 CAPSULE BY MOUTH EVERY DAY 90 capsule 3   ELIQUIS 5 MG TABS tablet TAKE 1 TABLET BY MOUTH TWICE A DAY 180 tablet 1   famotidine (PEPCID) 40 MG tablet TAKE 1 TABLET BY MOUTH EVERYDAY AT BEDTIME 90 tablet 0   finasteride (PROSCAR) 5 MG tablet TAKE 1 TABLET (5 MG TOTAL) BY MOUTH DAILY. 90 tablet 0   Fluorouracil 5 % SOLN Apply topically as needed. Per his dermatologist     hydrALAZINE (APRESOLINE) 50 MG tablet TAKE 1 TABLET BY MOUTH THREE TIMES A DAY 270 tablet 1   levothyroxine (SYNTHROID) 25 MCG tablet TAKE 1 TABLET BY MOUTH EVERY DAY BEFORE BREAKFAST 90 tablet 0   mirtazapine (REMERON) 7.5 MG tablet TAKE 1 TABLET BY MOUTH AT BEDTIME. 90 tablet 0   montelukast (SINGULAIR) 10 MG tablet TAKE 1 TABLET BY MOUTH EVERYDAY  AT BEDTIME 90 tablet 1   Multiple Vitamins-Minerals (PRESERVISION AREDS PO) Take 1 tablet by mouth daily.     omeprazole (PRILOSEC) 40 MG capsule TAKE 1 CAPSULE (40 MG TOTAL) BY MOUTH DAILY. 90 capsule 0   tamsulosin (FLOMAX) 0.4 MG CAPS capsule TAKE 1 CAPSULE BY MOUTH EVERY DAY 90 capsule 0   Tiotropium Bromide Monohydrate (SPIRIVA RESPIMAT) 2.5 MCG/ACT AERS INHALE 2 PUFFS BY MOUTH INTO THE LUNGS DAILY 12 g 1   Vitamins-Lipotropics (LIPOFLAVONOID) TABS Take 1 tablet by mouth in the morning, at  noon, and at bedtime.     No facility-administered medications prior to visit.    ROS Review of Systems  Constitutional:  Positive for fatigue. Negative for appetite change, chills, diaphoresis and unexpected weight change.  HENT: Negative.    Eyes: Negative.  Negative for visual disturbance.  Respiratory:  Positive for shortness of breath (unchanged DOE). Negative for cough, chest tightness and wheezing.   Cardiovascular:  Negative for chest pain, palpitations and leg swelling.  Gastrointestinal:  Negative for abdominal pain, constipation, diarrhea, nausea and vomiting.  Endocrine: Negative.   Genitourinary: Negative.  Negative for difficulty urinating.  Musculoskeletal: Negative.  Negative for arthralgias and myalgias.  Skin:  Negative for color change and rash.  Neurological:  Negative for dizziness and weakness.  Hematological:  Negative for adenopathy. Does not bruise/bleed easily.  Psychiatric/Behavioral:  Positive for confusion and decreased concentration. Negative for behavioral problems, sleep disturbance and suicidal ideas.     Objective:  BP 134/66 (BP Location: Left Arm, Patient Position: Sitting, Cuff Size: Normal)   Pulse 77   Temp 97.6 F (36.4 C) (Oral)   Ht 5\' 8"  (1.727 m)   Wt 133 lb 12.8 oz (60.7 kg)   SpO2 98%   BMI 20.34 kg/m   BP Readings from Last 3 Encounters:  02/03/23 134/66  08/02/22 138/64  07/19/22 126/68    Wt Readings from Last 3 Encounters:  02/03/23 133 lb 12.8 oz (60.7 kg)  09/21/22 136 lb (61.7 kg)  08/02/22 136 lb (61.7 kg)    Physical Exam Vitals reviewed.  Constitutional:      Appearance: Normal appearance.  HENT:     Nose: Nose normal.     Mouth/Throat:     Mouth: Mucous membranes are moist.  Eyes:     General: No scleral icterus.    Conjunctiva/sclera: Conjunctivae normal.  Cardiovascular:     Rate and Rhythm: Normal rate and regular rhythm.     Heart sounds: No murmur heard.    No friction rub. No gallop.      Comments: EKG- NSR, 65 bpm LAD Anteroseptal infarct pattern is old Q waves in III, aVF are new No acute changes  Pulmonary:     Effort: Pulmonary effort is normal.     Breath sounds: No stridor. No wheezing, rhonchi or rales.  Abdominal:     General: Abdomen is flat.     Palpations: There is no mass.     Tenderness: There is no abdominal tenderness. There is no guarding.     Hernia: No hernia is present.  Musculoskeletal:        General: Normal range of motion.     Cervical back: Neck supple.     Right lower leg: No edema.     Left lower leg: No edema.  Lymphadenopathy:     Cervical: No cervical adenopathy.  Skin:    General: Skin is warm and dry.     Findings: No rash.  Neurological:     General: No focal deficit present.     Mental Status: He is alert. Mental status is at baseline.  Psychiatric:        Attention and Perception: He is inattentive.        Mood and Affect: Mood normal.        Speech: Speech is delayed and tangential.        Behavior: Behavior normal.     Lab Results  Component Value Date   WBC 6.3 08/02/2022   HGB 14.7 08/02/2022   HCT 42.8 08/02/2022   PLT 180.0 08/02/2022   GLUCOSE 96 08/02/2022   CHOL 98 02/03/2023   TRIG 99.0 02/03/2023   HDL 40.60 02/03/2023   LDLCALC 38 02/03/2023   ALT 17 02/03/2023   AST 18 02/03/2023   NA 140 08/02/2022   K 3.9 08/02/2022   CL 105 08/02/2022   CREATININE 0.85 08/02/2022   BUN 13 08/02/2022   CO2 26 08/02/2022   TSH 3.98 02/03/2023   PSA 0.21 02/03/2023   INR 1.0 04/15/2011   HGBA1C 5.5 04/18/2020    MR Cervical Spine w/o contrast  Result Date: 07/22/2022 CLINICAL DATA:  72 year old male with left side neck pain radiating to the shoulder. Stiffness, decreased range of motion since November. Prior surgery. EXAM: MRI CERVICAL SPINE WITHOUT CONTRAST TECHNIQUE: Multiplanar, multisequence MR imaging of the cervical spine was performed. No intravenous contrast was administered. COMPARISON:  Cervical  spine radiographs 03/11/2022. FINDINGS: Alignment: Stable since December, mild straightening of lordosis. Subtle anterolisthesis of C7 on T1. No other significant spondylolisthesis. Vertebrae: Normal background bone marrow signal. Mild hardware susceptibility artifact from C5-C6 and C6-C7 ACDF. Evidence of solid arthrodesis at those levels. Moderate patchy marrow edema affecting the chronically degenerated left C3-C4 facets (series 5, images 14 and 15). Normal background bone marrow signal. Superimposed right side facet ankylosis at C2-C3. No other marrow edema or acute osseous abnormality. In the cervical spine. However, there is evidence of mild degenerative posterior element marrow edema in the right upper thoracic spine T1 and T2 (facets/pars series 5, image 4). Cord: Degenerative spinal cord mass effect is mild at C3-C4 and C4-C5. No spinal cord edema or myelomalacia. Negative visible upper thoracic cord. Posterior Fossa, vertebral arteries, paraspinal tissues: Cervicomedullary junction is within normal limits. Negative visible posterior fossa. Maintained major vascular flow voids in the neck with dominant appearing left vertebral artery. Negative visible neck soft tissues, lung apices. Disc levels: C2-C3:  Ankylosed right facet. No stenosis. C3-C4: Broad-based posterior disc bulge or protrusion with foraminal and posterior endplate spurring. Moderate ligament flavum (series 7, image 9) and mild to moderate facet hypertrophy which is greater on the left (marrow edema there described above). Mild spinal stenosis and spinal cord mass effect. Moderate to severe left and mild-to-moderate right C4 neural foraminal stenosis. C4-C5: Disc space loss with circumferential disc bulge. Broad-based posterior component. Similar endplate spurring. Moderate facet and ligament flavum hypertrophy (series 7, image 13). Mild spinal stenosis and spinal cord mass effect. Moderate to severe bilateral C5 foraminal stenosis. C5-C6:   Prior ACDF with solid arthrodesis.  No stenosis. C6-C7:  Prior ACDF with solid arthrodesis.  No stenosis. C7-T1: Subtle anterolisthesis. Mild facet and ligament flavum hypertrophy. No stenosis. T1-T2: Subtle anterolisthesis and disc bulging. Moderate facet hypertrophy with evidence of a left side foraminal 6 mm degenerative synovial cyst (series 5, image 12). Associated mild to moderate left T1 and mild right T1 neural foraminal stenosis. T2-T3: Negative, no stenosis. IMPRESSION: 1.  Prior ACDF at C5-C6 and C6-C7 with solid arthrodesis and no adverse features. And superimposed right side facet ankylosis at C2-C3. 2. 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 Subsequent mild multifactorial spinal stenosis AND spinal cord mass effect at both levels. No cord signal abnormality. Moderate to severe bilateral C4 and C5 neural foraminal stenosis. 3. Lesser adjacent segment disease at C7-T1 with facet hypertrophy. And moderate facet arthropathy at the T1-T2 level including a left foraminal synovial cyst contributing to moderate to severe left T1 neural foraminal stenosis. Electronically Signed   By: Odessa Fleming M.D.   On: 07/22/2022 14:17    Assessment & Plan:  Acquired hypothyroidism -     TSH; Future  Coronary artery disease involving native coronary artery of native heart without angina pectoris  Essential hypertension- His blood pressure is well-controlled. -     Hepatic function panel; Future  Hyperlipidemia with target LDL less than 70 - LDL goal achieved. Doing well on the statin  -     Lipid panel; Future -     Hepatic function panel; Future  DOE (dyspnea on exertion)- There are subtle inferior changes on the EKG.  Labs are reassuring.  Will evaluate with an ECHO. -     EKG 12-Lead -     Brain natriuretic peptide; Future -     Troponin I (High  Sensitivity); Future -     ECHOCARDIOGRAM COMPLETE; Future  Benign prostatic hyperplasia without lower urinary tract symptoms -     PSA; Future  Atypical atrial flutter (HCC)- He has good rate and rhythm control. 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   Abnormal electrocardiogram (ECG) (EKG) -     ECHOCARDIOGRAM COMPLETE; Future     Follow-up: Return in about 6 months (around 08/03/2023).  Sanda Linger, MD

## 2023-02-04 DIAGNOSIS — Z961 Presence of intraocular lens: Secondary | ICD-10-CM | POA: Diagnosis not present

## 2023-02-04 DIAGNOSIS — H02831 Dermatochalasis of right upper eyelid: Secondary | ICD-10-CM | POA: Diagnosis not present

## 2023-02-04 DIAGNOSIS — H04123 Dry eye syndrome of bilateral lacrimal glands: Secondary | ICD-10-CM | POA: Diagnosis not present

## 2023-02-04 DIAGNOSIS — H18413 Arcus senilis, bilateral: Secondary | ICD-10-CM | POA: Diagnosis not present

## 2023-02-18 ENCOUNTER — Other Ambulatory Visit (INDEPENDENT_AMBULATORY_CARE_PROVIDER_SITE_OTHER): Payer: Medicare PPO

## 2023-02-18 ENCOUNTER — Ambulatory Visit: Payer: Medicare PPO | Admitting: Orthopedic Surgery

## 2023-02-18 DIAGNOSIS — M79642 Pain in left hand: Secondary | ICD-10-CM

## 2023-02-18 DIAGNOSIS — S63634A Sprain of interphalangeal joint of right ring finger, initial encounter: Secondary | ICD-10-CM

## 2023-02-18 DIAGNOSIS — S63631A Sprain of interphalangeal joint of left index finger, initial encounter: Secondary | ICD-10-CM

## 2023-02-18 DIAGNOSIS — S63639A Sprain of interphalangeal joint of unspecified finger, initial encounter: Secondary | ICD-10-CM

## 2023-02-18 NOTE — Progress Notes (Unsigned)
Roy Long - 72 y.o. male MRN [id number]  Date of birth: [birth year]  Office Visit Note: Visit Date: 02/18/2023 PCP: Etta Grandchild, MD Referred by: Etta Grandchild, MD  Subjective: No chief complaint on file.  HPI: Roy Long is a pleasant 72 y.o. male who presents today for evaluation of a prior left hand injury for approximately 2 months ago.  States he jammed his two fingers, has improved without any formalized treatment. Able to move them more than when injury happened   Pertinent ROS were reviewed with the patient and found to be negative unless otherwise specified above in HPI.   Visit Reason: left hand/ index & ring finger injury, axial load/jamming injury Duration of symptoms: 2+ months Hand dominance: right Occupation: retired Diabetic: No Smoking: No Heart/Lung History: A-fib Blood Thinners: Eliquis  Prior Testing/EMG: none Injections (Date): none Treatments: none Prior Surgery: none    Assessment & Plan: Visit Diagnoses:  1. Pain in left hand     Plan: Extensive discussion was had the patient today regarding his left hand injury.  X-rays were reviewed today, there is concern for a chronic avulsion fracture of the index finger proximal phalanx at the level of the PIP joint, there is a small corticated dorsal fragment notable in this region which correlates with his injury history and ongoing swelling and pain at this region.  There is no significant instability at this region, he has appropriate flexion and extension at the PIP joint, his flexion is somewhat limited secondary to pain.  He likely has a significant PIP sprain of the left index finger which should respond to ongoing therapy.  We will have him seen by occupational therapy in the near future to begin range of motion exercises with progression of strengthening as tolerated.  He is welcome to return to me as needed moving forward.  Follow-up: No follow-ups on file.   Meds & Orders: No orders of  the defined types were placed in this encounter.   Orders Placed This Encounter  Procedures   XR Hand Complete Left   Ambulatory referral to Occupational Therapy     Procedures: No procedures performed      Clinical History: No specialty comments available.  He reports that he quit smoking about 39 years ago. His smoking use included cigarettes. He started smoking about 56 years ago. He has a 51 pack-year smoking history. He has never been exposed to tobacco smoke. He has never used smokeless tobacco. No results for input(s): "HGBA1C", "LABURIC" in the last 8760 hours.  Objective:   Vital Signs: There were no vitals taken for this visit.  Physical Exam  Gen: Well-appearing, in no acute distress; non-toxic CV: Regular Rate. Well-perfused. Warm.  Resp: Breathing unlabored on room air; no wheezing. Psych: Fluid speech in conversation; appropriate affect; normal thought process  Ortho Exam Left hand: - Tenderness notable to the PIP joint of the index and ring finger, more notably at the index finger - PIP range of motion of the index finger is limited, 0-45 active, slightly improved passively to approximately 60 degrees with associated pain, notable swelling (mild) - Hand remains sensate in all distributions, warm and well-perfused - No other significant sites of tenderness  Imaging: X-rays of the left hand taken today, interpretation in chart  Past Medical/Family/Surgical/Social History: Medications & Allergies reviewed per EMR, new medications updated. Patient Active Problem List   Diagnosis Date Noted   DOE (dyspnea on exertion) 02/03/2023   Acquired mallet  deformity of left index finger 02/03/2023   Encounter for general adult medical examination with abnormal findings 02/03/2023   Moderate episode of recurrent major depressive disorder (HCC) 01/19/2022   Hearing loss due to cerumen impaction, bilateral 07/13/2021   Panlobular emphysema (HCC) 01/07/2021   Neuropathy  involving both lower extremities 04/18/2020   Seasonal allergic rhinitis due to pollen 10/24/2018   Essential hypertension 04/25/2018   Laryngopharyngeal reflux (LPR) 02/07/2018   Kidney stone on left side 03/30/2016   IBS (irritable bowel syndrome) 06/27/2015   Coronary artery disease involving native coronary artery of native heart without angina pectoris 06/26/2015   OAB (overactive bladder) 04/16/2015   Atrial flutter (HCC)    Hyperlipidemia with target LDL less than 70 01/18/2014   BPH (benign prostatic hyperplasia) 04/23/2011   Hypothyroidism 04/15/2011   Routine general medical examination at a health care facility 04/15/2011   Atrial fibrillation (HCC)    Past Medical History:  Diagnosis Date   Age-related macular degeneration, dry, left eye    Alcohol dependence (HCC)    Arthritis    "maybe a little bit in my left knee and right forefinger" (05/30/2015)   Atrial fibrillation (HCC)    ablation 05-2015 with no reoccurance as of 09-2015   Basal cell carcinoma    Depression    hx   Family history of adverse reaction to anesthesia    "father had head injuries and dementia; everytime he was put under less of his mentation came back"   Gastroparesis    GERD (gastroesophageal reflux disease)    History of kidney stones    Hypercholesterolemia dx'd 02/2015   Hypertension    Hypothyroidism    Kidney stones    Migraine aura without headache    "very rare now" (05/30/2015)   Pneumonia ~ 2004   Squamous carcinoma    hand, chest   Family History  Problem Relation Age of Onset   Stroke Brother    Cancer Brother        basosquamous cell carcinoma-head   Heart failure Mother    Colon cancer Cousin 50   Heart disease Neg Hx    Hyperlipidemia Neg Hx    Hypertension Neg Hx    Diabetes Neg Hx    Past Surgical History:  Procedure Laterality Date   ANTERIOR CERVICAL DECOMP/DISCECTOMY FUSION  04/2009   ATRIAL FIBRILLATION ABLATION  05/30/2015   BACK SURGERY     BASAL CELL CARCINOMA  EXCISION     chest or back or left arm   CARDIOVERSION  06/2002   CATARACT EXTRACTION W/ INTRAOCULAR LENS IMPLANT Left ~ 2012   COLONOSCOPY     COLONOSCOPY W/ POLYPECTOMY  02/2003   CYSTOSCOPY W/ STONE MANIPULATION  1980's   "basketted it out; no stent"   ELECTROPHYSIOLOGIC STUDY N/A 05/30/2015   Procedure: Atrial Fibrillation Ablation;  Surgeon: Will Jorja Loa, MD;  Location: [facility];  Service: Cardiovascular;  Laterality: N/A;   EXTRACORPOREAL SHOCK WAVE LITHOTRIPSY Left 04/12/2016   Procedure: LEFT EXTRACORPOREAL SHOCK WAVE LITHOTRIPSY (ESWL);  Surgeon: Bjorn Pippin, MD;  Location: [facility];  Service: Urology;  Laterality: Left;   INGUINAL HERNIA REPAIR Bilateral 2004   INGUINAL HERNIA REPAIR Bilateral 2004   MOHS SURGERY Right    "temple"   SQUAMOUS CELL CARCINOMA EXCISION     chest or back or left arm   Social History   Occupational History   Occupation: band teacher    Comment: retired  Tobacco Use   Smoking status:  Former    Current packs/day: 0.00    Average packs/day: 3.0 packs/day for 17.0 years (51.0 ttl pk-yrs)    Types: Cigarettes    Start date: 03/29/1966    Quit date: 03/30/1983    Years since quitting: 39.9    Passive exposure: Never   Smokeless tobacco: Never  Vaping Use   Vaping status: Never Used  Substance and Sexual Activity   Alcohol use: No    Alcohol/week: 0.0 standard drinks of alcohol    Comment: 05/30/2015 "recovering alcoholic; dry date 04/19/2011"   Drug use: No    Types: Marijuana, LSD, Mescaline    Comment: 03/07/2015 "stopped all drugs back in the 1980's"   Sexual activity: Not Currently    Birth control/protection: None    Domnic Vantol Fara Boros) Denese Killings, M.D. [facility] 9:45 AM

## 2023-02-21 NOTE — Therapy (Signed)
OUTPATIENT OCCUPATIONAL THERAPY ORTHO EVALUATION  Patient Name: Roy Long MRN: [id number] DOB:[birth year], 72 y.o., male Today's Date: 02/22/2023  PCP: Sanda Linger, MD REFERRING PROVIDER:  Samuella Cota, MD    END OF SESSION:  OT End of Session - 02/22/23 1016     Visit Number 1    Number of Visits 12    Date for OT Re-Evaluation 04/08/23    Authorization Type [employer]    OT Start Time 1016    OT Stop Time 1111    OT Time Calculation (min) 55 min    Equipment Utilized During Treatment orthotic materials    Activity Tolerance Patient tolerated treatment well;No increased pain;Patient limited by pain;Patient limited by fatigue    Behavior During Therapy [facility] for tasks assessed/performed             Past Medical History:  Diagnosis Date   Age-related macular degeneration, dry, left eye    Alcohol dependence (HCC)    Arthritis    "maybe a little bit in my left knee and right forefinger" (05/30/2015)   Atrial fibrillation (HCC)    ablation 05-2015 with no reoccurance as of 09-2015   Basal cell carcinoma    Depression    hx   Family history of adverse reaction to anesthesia    "father had head injuries and dementia; everytime he was put under less of his mentation came back"   Gastroparesis    GERD (gastroesophageal reflux disease)    History of kidney stones    Hypercholesterolemia dx'd 02/2015   Hypertension    Hypothyroidism    Kidney stones    Migraine aura without headache    "very rare now" (05/30/2015)   Pneumonia ~ 2004   Squamous carcinoma    hand, chest   Past Surgical History:  Procedure Laterality Date   ANTERIOR CERVICAL DECOMP/DISCECTOMY FUSION  04/2009   ATRIAL FIBRILLATION ABLATION  05/30/2015   BACK SURGERY     BASAL CELL CARCINOMA EXCISION     chest or back or left arm   CARDIOVERSION  06/2002   CATARACT EXTRACTION W/ INTRAOCULAR LENS IMPLANT Left ~ 2012   COLONOSCOPY     COLONOSCOPY W/ POLYPECTOMY  02/2003   CYSTOSCOPY W/ STONE  MANIPULATION  1980's   "basketted it out; no stent"   ELECTROPHYSIOLOGIC STUDY N/A 05/30/2015   Procedure: Atrial Fibrillation Ablation;  Surgeon: Will Jorja Loa, MD;  Location: [facility];  Service: Cardiovascular;  Laterality: N/A;   EXTRACORPOREAL SHOCK WAVE LITHOTRIPSY Left 04/12/2016   Procedure: LEFT EXTRACORPOREAL SHOCK WAVE LITHOTRIPSY (ESWL);  Surgeon: Bjorn Pippin, MD;  Location: [facility];  Service: Urology;  Laterality: Left;   INGUINAL HERNIA REPAIR Bilateral 2004   INGUINAL HERNIA REPAIR Bilateral 2004   MOHS SURGERY Right    "temple"   SQUAMOUS CELL CARCINOMA EXCISION     chest or back or left arm   Patient Active Problem List   Diagnosis Date Noted   DOE (dyspnea on exertion) 02/03/2023   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 02/03/2023   Encounter for general adult medical examination with abnormal findings 02/03/2023   Moderate episode of recurrent major depressive disorder (HCC) 01/19/2022   Hearing loss due to cerumen impaction, bilateral 07/13/2021   Panlobular emphysema (HCC) 01/07/2021   Neuropathy involving both lower extremities 04/18/2020   Seasonal allergic rhinitis due to pollen 10/24/2018   Essential hypertension 04/25/2018   Laryngopharyngeal reflux (LPR) 02/07/2018   Kidney stone on left side 03/30/2016  IBS (irritable bowel syndrome) 06/27/2015   Coronary artery disease involving native coronary artery of native heart without angina pectoris 06/26/2015   OAB (overactive bladder) 04/16/2015   Atrial flutter (HCC)    Hyperlipidemia with target LDL less than 70 01/18/2014   BPH (benign prostatic hyperplasia) 04/23/2011   Hypothyroidism 04/15/2011   Routine general medical examination at a health care facility 04/15/2011   Atrial fibrillation (HCC)     ONSET DATE: approx 3 months ago (fall in Sept 2024)   REFERRING DIAG: E33.295 (ICD-10-CM) - Pain in left hand   THERAPY DIAG:  Stiffness of left hand, not elsewhere classified - Plan:  Ot plan of care cert/re-cert  Localized edema - Plan: Ot plan of care cert/re-cert  Muscle weakness (generalized) - Plan: Ot plan of care cert/re-cert  Other lack of coordination - Plan: Ot plan of care cert/re-cert  Pain in joint of left hand - Plan: Ot plan of care cert/re-cert  Rationale for Evaluation and Treatment: Rehabilitation  SUBJECTIVE:   SUBJECTIVE STATEMENT: He states 2-3 months ago falling in shower while washing his dog. About 4 weeks after that. He had surgery to Lt hand for skin cancer removal/biopsy and was put in bulky "cast."   He doesn't have much pain now, unless he squeezes or weight bearing, but is mostly concerned with stiffness.     PERTINENT HISTORY: Per referral: "Left hand index/ring finger PIP sprain; ROM,strengthening"   "X-rays were reviewed today, there is concern for a chronic avulsion fracture of the index finger proximal phalanx at the level of the PIP joint, there is a small corticated dorsal fragment notable in this region which correlates with his injury history and ongoing swelling and pain at this region. There is no significant instability at this region, he has appropriate flexion and extension at the PIP joint, his flexion is somewhat limited secondary to pain. He likely has a significant PIP sprain of the left index finger which should respond to ongoing therapy. We will have him seen by occupational therapy in the near future to begin range of motion exercises with progression of strengthening as tolerated. He is welcome to return to me as needed moving forward. "  PRECAUTIONS: None ; RED FLAGS: None   WEIGHT BEARING RESTRICTIONS: No  PAIN:  Are you having pain? Yes: NPRS scale: 0-1/10 Pain location: Lt IF, RF dorsally Pain description: Only sore at times Aggravating factors: Very strong squeezing or gripping or weightbearing Relieving factors: Rest and avoidance behaviors  FALLS: Has patient fallen in last 6 months? Yes. Number of falls 2,  (accidents, not consider fall risk)   LIVING ENVIRONMENT: Lives with: lives alone with his dog Lives in: House/apartment Has following equipment at home: None  PLOF: Independent  PATIENT GOALS: Improve the movement and use of his left nondominant hand  NEXT MD VISIT: PRN   OBJECTIVE: (All objective assessments below are from initial evaluation on: 02/22/23 unless otherwise specified.)   HAND DOMINANCE: Right   ADLs: Overall ADLs: States decreased ability to grab, hold household objects, pain and difficulty to open containers, perform FMS tasks (manipulate fasteners on clothing)  FUNCTIONAL OUTCOME MEASURES: Eval: Quick DASH 20% impairment today  (Higher % Score  =  More Impairment)    UPPER EXTREMITY ROM     Shoulder to Wrist AROM Left eval  Wrist flexion 80  Wrist extension 55  (Blank rows = not tested)   Hand AROM Left eval  Full Fist Ability (or Gap to Distal Palmar Crease)  5cm gap from tip Lt IF to [facility] (RF touches palm loosely)   Thumb Opposition  (Kapandji Scale)  9/10  Index MCP (0-90) 0-  56  Index PIP (0-100) (+3) -57  Index DIP (0-70) (-18*) -  40  Ring MCP (0-90) 0 - 78  Ring PIP (0-100) (-20) -  92  Ring DIP (0-70) (+3) -  36  (Blank rows = not tested)   UPPER EXTREMITY MMT:    Eval:  NT at eval due to recent and still healing injuries. Will be tested when appropriate.   MMT Left TBD  Wrist flexion   Wrist extension   IF flex/ext   RF flex/ext   (Blank rows = not tested)  HAND FUNCTION: Eval: Observed weakness in affected Lt hand.  Details will be tested when safe and appropriate Grip strength Right: TBD lbs, Left: TBD lbs   COORDINATION: Eval: Observed coordination impairments with affected left hand. 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  It is quite interesting due to the mechanism of injury his index finger should be a true boutonniere deformity, but perhaps due to the surgery he had after his injury and the subsequent wrapping and immobilization he healed an extended PIP posture.  Now he presents is largely healed which is a good thing just very stiff with capsular tightness in the index finger (positive test of intrinsic tightness), and less so in the ring finger but some intrinsic tightness present there.     TODAY'S TREATMENT:  Post-evaluation treatment:   Due to capsular tightness present in the index finger but less so in the ring finger, a dynamic flexion orthosis is called for to help relieve his now chronic stiffness.  Unfortunately there is no time for this during evaluation today, but there is time to make him a custom relative motion orthosis which will also help promote flexion at the IP joints of both the index finger and ring fingers.  Will also help correct the swan necking in the index finger and perhaps a hyperextension at the ring finger DIP joint.  OT fabricates this for him and he states it fits fairly well, no significant pain or rubbing when wearing it.  He was told to take it off if it does bother him.  He can wear it with light functional activity as possible, or perform 10-15 times of light gripping for 3 to 5 seconds squeezes to help "train" his fingers to get more flexion carefully and on painfully.  Additionally, he was given a comprehensive home exercise program that differentiates problems at the index finger and ring fingers.  These are listed below, and he has some apparent confusion with them while being educated so they  will likely need to be addressed several times in upcoming sessions.  Exercises - Wrist Prayer Stretch  - 4 x daily - 3-5 reps - 15 sec hold - Tendon Glides  - 4-6 x daily - 3-5 reps - 2-3 seconds hold -  Tip Joint Blocking Motion  - 4-6 x daily - 10-15 reps  RING FINGER ONLY - Hand AROM Reverse Blocking  - 4-6 x daily - 10-15 reps RING FINGER ONLY - Hand AROM PIP Blocking  - 4-6 x daily - 10-15 reps INDEX FINGER ONLY - BACK KNUCKLE STRETCHES   - 4 x daily - 3-5 reps - 15 sec hold INDEX FINGER ONLY - HOOK Stretch  - 4 x daily - 3-5 reps - 15-20 sec hold  Both - Seated Finger Composite Flexion Stretch  - 4 x daily - 3-5 reps - 15 hold Both  PATIENT EDUCATION: Education details: See tx section above for details  Person educated: Patient Education method: Verbal Instruction, Teach back, Handouts  Education comprehension: States and demonstrates understanding, Additional Education required    HOME EXERCISE PROGRAM: Access Code: [email or web address] URL: [email or web address] Date: 02/22/2023 Prepared by: Fannie Knee   GOALS: Goals reviewed with patient? Yes   SHORT TERM GOALS: (STG required if POC>30 days) Target Date: 03/11/23  Pt will obtain protective, custom orthotic. Goal status: 02/22/23: partially met- will need dynamic orthosis in next 1-2 sessions as well   2.  Pt will demo/state understanding of initial HEP to improve pain levels and prerequisite motion. Goal status: INITIAL   LONG TERM GOALS: Target Date: 04/08/23  Pt will improve functional ability by decreased impairment per Quick DASH assessment from 20% to 10% or better, for better quality of life. Goal status: INITIAL  2.  Pt will improve grip strength in Lt hand from unable, stiff to at least 35lbs for functional use at home and in IADLs. Goal status: INITIAL  3.  Pt will improve A/ROM in Lt IF TAM from 138* to at least 200*, to have functional motion for tasks like grasp.  Goal status: INITIAL  4.   Pt will improve A/ROM in Lt RF TAM from 189* to at least 220*, to have functional motion for tasks like grasp.  Goal status: INITIAL  5.  Pt will improve coordination skills in Lt hand, as seen by [facility] score on 9HPT testing to have increased functional ability to carry out fine motor tasks (fasteners, etc.) and more complex, coordinated IADLs (meal prep, sports, etc.).  Goal status: INITIAL   ASSESSMENT:  CLINICAL IMPRESSION: Patient is a 72 y.o. male who was seen today for occupational therapy evaluation for now chronic injury to left hand index finger and ring finger.  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  It causes weakness, pain and largely stiffness.  He will benefit from outpatient occupational therapy to decrease negative symptoms and increase functionality with his left nondominant hand.  PERFORMANCE DEFICITS: in functional skills including ADLs, IADLs, coordination, dexterity, edema, ROM, strength, pain, fascial restrictions, flexibility, Fine motor control, body mechanics, endurance, decreased knowledge of precautions, and UE functional use, cognitive skills including problem solving and safety awareness, and psychosocial skills including coping strategies and habits.   IMPAIRMENTS: are limiting patient from ADLs, IADLs, leisure, and social participation.   COMORBIDITIES: may have co-morbidities  that affects occupational performance. Patient will benefit from skilled OT to address above impairments and improve overall function.  MODIFICATION OR ASSISTANCE TO COMPLETE EVALUATION: Min-Moderate modification of tasks or assist with assess necessary to complete an evaluation.  OT OCCUPATIONAL PROFILE AND HISTORY: Detailed assessment: Review of records and additional review of physical, cognitive, psychosocial history related to current functional performance.  CLINICAL  DECISION MAKING:  Moderate - several treatment options, min-mod task modification necessary  REHAB POTENTIAL: Good  EVALUATION COMPLEXITY: Moderate      PLAN:  OT FREQUENCY: 1-2x/week  OT DURATION: 6 weeks  PLANNED INTERVENTIONS: 97168 OT Re-evaluation, 97535 self care/ADL training, 60454 therapeutic exercise, 97530 therapeutic activity, 97112 neuromuscular re-education, 97140 manual therapy, 97035 ultrasound, 97010 moist heat, 97010 cryotherapy, 97034 contrast bath, 97760 Orthotics management and training, 09811 Splinting (initial encounter), [id number] Subsequent splinting/medication, compression bandaging, Dry needling, energy conservation, coping strategies training, and patient/family education  RECOMMENDED OTHER SERVICES: none now   CONSULTED AND AGREED WITH PLAN OF CARE: Patient  PLAN FOR NEXT SESSION:   Make dynamic orthosis next session for capsular tightness in IF.  Review home exercise program and initial recommendations and also check relative motion orthosis   Fannie Knee, OTR/L, CHT 02/22/2023, 3:48 PM   Referring diagnosis? B14.782 (ICD-10-CM) - Pain in left hand  Treatment diagnosis? (if different than referring diagnosis) R60, M62.81, R27.8, M25.542, M25.642 What was this (referring dx) caused by? []  Surgery [x]  Fall []  Ongoing issue []  Arthritis []  Other: ____________  Laterality: []  Rt [x]  Lt []  Both  Check all possible CPT codes:  *CHOOSE 10 OR LESS*    See Planned Interventions listed in the Plan section of the Evaluation.

## 2023-02-22 ENCOUNTER — Encounter: Payer: Self-pay | Admitting: Rehabilitative and Restorative Service Providers"

## 2023-02-22 ENCOUNTER — Ambulatory Visit: Payer: Medicare PPO | Admitting: Rehabilitative and Restorative Service Providers"

## 2023-02-22 ENCOUNTER — Other Ambulatory Visit: Payer: Self-pay

## 2023-02-22 DIAGNOSIS — M25642 Stiffness of left hand, not elsewhere classified: Secondary | ICD-10-CM | POA: Diagnosis not present

## 2023-02-22 DIAGNOSIS — R278 Other lack of coordination: Secondary | ICD-10-CM | POA: Diagnosis not present

## 2023-02-22 DIAGNOSIS — M6281 Muscle weakness (generalized): Secondary | ICD-10-CM

## 2023-02-22 DIAGNOSIS — R600 Localized edema: Secondary | ICD-10-CM | POA: Diagnosis not present

## 2023-02-22 DIAGNOSIS — M25542 Pain in joints of left hand: Secondary | ICD-10-CM

## 2023-02-28 ENCOUNTER — Ambulatory Visit: Payer: Medicare PPO | Admitting: Rehabilitative and Restorative Service Providers"

## 2023-02-28 ENCOUNTER — Encounter: Payer: Self-pay | Admitting: Rehabilitative and Restorative Service Providers"

## 2023-02-28 DIAGNOSIS — M6281 Muscle weakness (generalized): Secondary | ICD-10-CM | POA: Diagnosis not present

## 2023-02-28 DIAGNOSIS — R278 Other lack of coordination: Secondary | ICD-10-CM

## 2023-02-28 DIAGNOSIS — M25642 Stiffness of left hand, not elsewhere classified: Secondary | ICD-10-CM | POA: Diagnosis not present

## 2023-02-28 DIAGNOSIS — M25542 Pain in joints of left hand: Secondary | ICD-10-CM | POA: Diagnosis not present

## 2023-02-28 DIAGNOSIS — R600 Localized edema: Secondary | ICD-10-CM

## 2023-02-28 NOTE — Therapy (Signed)
OUTPATIENT OCCUPATIONAL THERAPY TREATMENT NOTE  Patient Name: Roy Long MRN: [id number] DOB:February 24, 1951, 72 y.o., male Today's Date: 02/28/2023  PCP: Sanda Linger, MD REFERRING PROVIDER:  Samuella Cota, MD    END OF SESSION:  OT End of Session - 02/28/23 1258     Visit Number 2    Number of Visits 12    Date for OT Re-Evaluation 04/08/23    Authorization Type [employer]    OT Start Time 1300    OT Stop Time 1347    OT Time Calculation (min) 47 min    Equipment Utilized During Treatment orthotic materials    Activity Tolerance Patient tolerated treatment well;No increased pain;Patient limited by pain;Patient limited by fatigue    Behavior During Therapy [facility] for tasks assessed/performed              Past Medical History:  Diagnosis Date   Age-related macular degeneration, dry, left eye    Alcohol dependence (HCC)    Arthritis    "maybe a little bit in my left knee and right forefinger" (05/30/2015)   Atrial fibrillation (HCC)    ablation 05-2015 with no reoccurance as of 09-2015   Basal cell carcinoma    Depression    hx   Family history of adverse reaction to anesthesia    "father had head injuries and dementia; everytime he was put under less of his mentation came back"   Gastroparesis    GERD (gastroesophageal reflux disease)    History of kidney stones    Hypercholesterolemia dx'd 02/2015   Hypertension    Hypothyroidism    Kidney stones    Migraine aura without headache    "very rare now" (05/30/2015)   Pneumonia ~ 2004   Squamous carcinoma    hand, chest   Past Surgical History:  Procedure Laterality Date   ANTERIOR CERVICAL DECOMP/DISCECTOMY FUSION  04/2009   ATRIAL FIBRILLATION ABLATION  05/30/2015   BACK SURGERY     BASAL CELL CARCINOMA EXCISION     chest or back or left arm   CARDIOVERSION  06/2002   CATARACT EXTRACTION W/ INTRAOCULAR LENS IMPLANT Left ~ 2012   COLONOSCOPY     COLONOSCOPY W/ POLYPECTOMY  02/2003   CYSTOSCOPY W/ STONE  MANIPULATION  1980's   "basketted it out; no stent"   ELECTROPHYSIOLOGIC STUDY N/A 05/30/2015   Procedure: Atrial Fibrillation Ablation;  Surgeon: Will Jorja Loa, MD;  Location: [facility];  Service: Cardiovascular;  Laterality: N/A;   EXTRACORPOREAL SHOCK WAVE LITHOTRIPSY Left 04/12/2016   Procedure: LEFT EXTRACORPOREAL SHOCK WAVE LITHOTRIPSY (ESWL);  Surgeon: Bjorn Pippin, MD;  Location: [facility];  Service: Urology;  Laterality: Left;   INGUINAL HERNIA REPAIR Bilateral 2004   INGUINAL HERNIA REPAIR Bilateral 2004   MOHS SURGERY Right    "temple"   SQUAMOUS CELL CARCINOMA EXCISION     chest or back or left arm   Patient Active Problem List   Diagnosis Date Noted   DOE (dyspnea on exertion) 02/03/2023   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 02/03/2023   Encounter for general adult medical examination with abnormal findings 02/03/2023   Moderate episode of recurrent major depressive disorder (HCC) 01/19/2022   Hearing loss due to cerumen impaction, bilateral 07/13/2021   Panlobular emphysema (HCC) 01/07/2021   Neuropathy involving both lower extremities 04/18/2020   Seasonal allergic rhinitis due to pollen 10/24/2018   Essential hypertension 04/25/2018   Laryngopharyngeal reflux (LPR) 02/07/2018   Kidney stone on left side 03/30/2016  IBS (irritable bowel syndrome) 06/27/2015   Coronary artery disease involving native coronary artery of native heart without angina pectoris 06/26/2015   OAB (overactive bladder) 04/16/2015   Atrial flutter (HCC)    Hyperlipidemia with target LDL less than 70 01/18/2014   BPH (benign prostatic hyperplasia) 04/23/2011   Hypothyroidism 04/15/2011   Routine general medical examination at a health care facility 04/15/2011   Atrial fibrillation (HCC)     ONSET DATE: approx 3 months ago (fall in Sept 2024)   REFERRING DIAG: W11.914 (ICD-10-CM) - Pain in left hand   THERAPY DIAG:  Localized edema  Other lack of coordination  Pain in  joint of left hand  Muscle weakness (generalized)  Stiffness of left hand, not elsewhere classified  Rationale for Evaluation and Treatment: Rehabilitation  PERTINENT HISTORY: Per referral: "Left hand index/ring finger PIP sprain; ROM,strengthening"   "X-rays were reviewed today, there is concern for a chronic avulsion fracture of the index finger proximal phalanx at the level of the PIP joint, there is a small corticated dorsal fragment notable in this region which correlates with his injury history and ongoing swelling and pain at this region. There is no significant instability at this region, he has appropriate flexion and extension at the PIP joint, his flexion is somewhat limited secondary to pain. He likely has a significant PIP sprain of the left index finger which should respond to ongoing therapy. We will have him seen by occupational therapy in the near future to begin range of motion exercises with progression of strengthening as tolerated. He is welcome to return to me as needed moving forward. "  He states 2-3 months ago falling in shower while washing his dog. About 4 weeks after that. He had surgery to Lt hand for skin cancer removal/biopsy and was put in bulky "cast."   He doesn't have much pain now, unless he squeezes or weight bearing, but is mostly concerned with stiffness.    PRECAUTIONS: None ; RED FLAGS: None   WEIGHT BEARING RESTRICTIONS: No   SUBJECTIVE:   SUBJECTIVE STATEMENT:  He states his relative motion orthosis seems to be working but he has difficulty wearing it for long periods of time because it does rub on him somewhat.  Other than that he does see a small bit of progress.    PAIN:  Are you having pain? Not today  PATIENT GOALS: Improve the movement and use of his left nondominant hand  NEXT MD VISIT: PRN   OBJECTIVE: (All objective assessments below are from initial evaluation on: 02/22/23 unless otherwise specified.)   HAND DOMINANCE: Right    ADLs: Overall ADLs: States decreased ability to grab, hold household objects, pain and difficulty to open containers, perform FMS tasks (manipulate fasteners on clothing)  FUNCTIONAL OUTCOME MEASURES: Eval: Quick DASH 20% impairment today  (Higher % Score  =  More Impairment)    UPPER EXTREMITY ROM     Shoulder to Wrist AROM Left eval Lt 02/28/23  Wrist flexion 80 70  Wrist extension 55 50  (Blank rows = not tested)   Hand AROM Left eval Lt 02/28/23  Full Fist Ability (or Gap to Distal Palmar Crease) 5cm gap from tip Lt IF to [facility] (RF touches palm loosely)  4.8cm gap from tip Lt IF to [facility]   Thumb Opposition  (Kapandji Scale)  9/10   Index MCP (0-90) 0-  56 0 - 64  Index PIP (0-100) (+3) -57 0 - 71  Index DIP (0-70) (-18*) -  40 (-18) - 40  Ring MCP (0-90) 0 - 78 0 - 77  Ring PIP (0-100) (-20) -  92 (-7) - 92  Ring DIP (0-70) (+3) -  36 (+3) - 55  (Blank rows = not tested)   UPPER EXTREMITY MMT:    Eval:  NT at eval due to recent and still healing injuries. Will be tested when appropriate.   MMT Left TBD  Wrist flexion   Wrist extension   IF flex/ext   RF flex/ext   (Blank rows = not tested)  HAND FUNCTION: Eval: Observed weakness in affected Lt hand.  Details will be tested when safe and appropriate Grip strength Right: TBD lbs, Left: TBD lbs   COORDINATION: Eval: Observed coordination impairments with affected left hand. 9 Hole Peg Test Left: TBD sec (TBD sec is WFL)   EDEMA:   Eval:  Mildly swollen in left hand and index and ring fingers today, some of this presenting as arthritis in the MCP joints and likely some from stiffness and immobilization  OBSERVATIONS:   Eval: He has apparent swan necking in the left index finger and apparent pseudo boutonniere injury in the left ring finger though these things are very mild.  It is quite interesting due to the mechanism of injury his index finger should be a true boutonniere deformity, but perhaps due to the surgery  he had after his injury and the subsequent wrapping and immobilization he healed an extended PIP posture.  Now he presents is largely healed which is a good thing just very stiff with capsular tightness in the index finger (positive test of intrinsic tightness), and less so in the ring finger but some intrinsic tightness present there.     TODAY'S TREATMENT:  02/28/23: He starts with active range of motion for exercise as well as new detailed measures which does show total active improvement in both fingers.  Index finger DIP joint is very stiff and due to capsular tightness will benefit from a custom, dynamic flexion orthosis to be worn 2-3 times a day for 25 to 30 minutes at a time as tolerated.  OT fabricates this orthosis for him that is forearm-based has rubber bands and can be used for the index and ring fingers of the left hand.  He was educated on when and how to wear, how to put it on and off, and to not use it if it is causing pain or problems.  He tolerates it today for several minutes to ensure that it is a good fit.  This was a complex orthosis that took the majority of today's session to fabricate.  Relative motion orthosis does not need adjustments, he should continue the way he is and do not wear it if it is causing some rubbing take some "time off."  Lastly we discussed his home exercise program and he states being able to use the videos online which helps gives clarity to the exercises.  He states that he feels that he is doing well with them all, but they can be addressed in upcoming session if need be.  He leaves with no increase in pain stating understanding his plan of care and use of new dynamic orthosis.   PATIENT EDUCATION: Education details: See tx section above for details  Person educated: Patient Education method: Verbal Instruction, Teach back, Handouts  Education comprehension: States and demonstrates understanding, Additional Education required    HOME EXERCISE  PROGRAM: Access Code: [email or web address] URL: [email or web address] Date: 02/22/2023 Prepared by: Kenna Gilbert  Derek Huneycutt   GOALS: Goals reviewed with patient? Yes   SHORT TERM GOALS: (STG required if POC>30 days) Target Date: 03/11/23  Pt will obtain protective, custom orthotic. Goal status: 02/28/23: MET 02/22/23: partially met- will need dynamic orthosis in next 1-2 sessions as well   2.  Pt will demo/state understanding of initial HEP to improve pain levels and prerequisite motion. Goal status: INITIAL   LONG TERM GOALS: Target Date: 04/08/23  Pt will improve functional ability by decreased impairment per Quick DASH assessment from 20% to 10% or better, for better quality of life. Goal status: INITIAL  2.  Pt will improve grip strength in Lt hand from unable, stiff to at least 35lbs for functional use at home and in IADLs. Goal status: INITIAL  3.  Pt will improve A/ROM in Lt IF TAM from 138* to at least 200*, to have functional motion for tasks like grasp.  Goal status: INITIAL  4.  Pt will improve A/ROM in Lt RF TAM from 189* to at least 220*, to have functional motion for tasks like grasp.  Goal status: INITIAL  5.  Pt will improve coordination skills in Lt hand, as seen by [facility] score on 9HPT testing to have increased functional ability to carry out fine motor tasks (fasteners, etc.) and more complex, coordinated IADLs (meal prep, sports, etc.).  Goal status: INITIAL   ASSESSMENT:  CLINICAL IMPRESSION: 02/28/23: 157* TAM in Lt IF today shows excellent improvement so far.  We will continue plan of care and see how his new orthosis affects his motion, next week.  Pain is staying low and has not been an issue so far.    PLAN:  OT FREQUENCY: 1-2x/week  OT DURATION: 6 weeks  PLANNED INTERVENTIONS: 97168 OT Re-evaluation, 97535 self care/ADL training, 78295 therapeutic exercise, 97530 therapeutic activity, 97112 neuromuscular re-education, 97140 manual therapy, 97035  ultrasound, 97010 moist heat, 97010 cryotherapy, 97034 contrast bath, 97760 Orthotics management and training, 62130 Splinting (initial encounter), [id number] Subsequent splinting/medication, compression bandaging, Dry needling, energy conservation, coping strategies training, and patient/family education  RECOMMENDED OTHER SERVICES: none now   CONSULTED AND AGREED WITH PLAN OF CARE: Patient  PLAN FOR NEXT SESSION:   Check new dynamic orthosis as needed, check range of motion, check home exercise program, perform manual therapy and modalities as helpful to recovering motion and increasing ability.   Fannie Knee, OTR/L, CHT 02/28/2023, 3:19 PM

## 2023-03-01 ENCOUNTER — Other Ambulatory Visit: Payer: Self-pay | Admitting: Cardiology

## 2023-03-01 ENCOUNTER — Encounter: Payer: Medicare PPO | Admitting: Rehabilitative and Restorative Service Providers"

## 2023-03-01 ENCOUNTER — Other Ambulatory Visit: Payer: Self-pay | Admitting: Internal Medicine

## 2023-03-01 ENCOUNTER — Ambulatory Visit (HOSPITAL_COMMUNITY): Payer: Medicare PPO | Attending: Cardiology

## 2023-03-01 DIAGNOSIS — A6000 Herpesviral infection of urogenital system, unspecified: Secondary | ICD-10-CM

## 2023-03-01 DIAGNOSIS — K2100 Gastro-esophageal reflux disease with esophagitis, without bleeding: Secondary | ICD-10-CM

## 2023-03-01 DIAGNOSIS — R0609 Other forms of dyspnea: Secondary | ICD-10-CM

## 2023-03-01 DIAGNOSIS — I2510 Atherosclerotic heart disease of native coronary artery without angina pectoris: Secondary | ICD-10-CM

## 2023-03-01 DIAGNOSIS — R9431 Abnormal electrocardiogram [ECG] [EKG]: Secondary | ICD-10-CM

## 2023-03-01 DIAGNOSIS — I10 Essential (primary) hypertension: Secondary | ICD-10-CM

## 2023-03-01 DIAGNOSIS — K219 Gastro-esophageal reflux disease without esophagitis: Secondary | ICD-10-CM

## 2023-03-01 DIAGNOSIS — I480 Paroxysmal atrial fibrillation: Secondary | ICD-10-CM

## 2023-03-01 DIAGNOSIS — F331 Major depressive disorder, recurrent, moderate: Secondary | ICD-10-CM

## 2023-03-01 LAB — ECHOCARDIOGRAM COMPLETE
Area-P 1/2: 3.85 cm2
S' Lateral: 2.2 cm

## 2023-03-01 NOTE — Telephone Encounter (Signed)
Eliquis 5mg  refill request received. Patient is 72 years old, weight-60.7kg, Crea-0.85 on 08/02/22, Diagnosis-Afib, and last seen by Dr. Elberta Fortis on 07/19/22. Dose is appropriate based on dosing criteria. Will send in refill to requested pharmacy.

## 2023-03-08 ENCOUNTER — Ambulatory Visit: Payer: Medicare PPO | Admitting: Rehabilitative and Restorative Service Providers"

## 2023-03-08 ENCOUNTER — Encounter: Payer: Self-pay | Admitting: Rehabilitative and Restorative Service Providers"

## 2023-03-08 DIAGNOSIS — R600 Localized edema: Secondary | ICD-10-CM

## 2023-03-08 DIAGNOSIS — M25542 Pain in joints of left hand: Secondary | ICD-10-CM | POA: Diagnosis not present

## 2023-03-08 DIAGNOSIS — M25642 Stiffness of left hand, not elsewhere classified: Secondary | ICD-10-CM

## 2023-03-08 DIAGNOSIS — R278 Other lack of coordination: Secondary | ICD-10-CM

## 2023-03-08 DIAGNOSIS — M25512 Pain in left shoulder: Secondary | ICD-10-CM | POA: Diagnosis not present

## 2023-03-08 DIAGNOSIS — M6281 Muscle weakness (generalized): Secondary | ICD-10-CM

## 2023-03-08 NOTE — Therapy (Signed)
OUTPATIENT OCCUPATIONAL THERAPY TREATMENT NOTE  Patient Name: Roy Long MRN: [id number] DOB:1951-01-03, 72 y.o., male Today's Date: 03/08/2023  PCP: Sanda Linger, MD REFERRING PROVIDER:  Samuella Cota, MD    END OF SESSION:  OT End of Session - 03/08/23 1151     Visit Number 3    Number of Visits 12    Date for OT Re-Evaluation 04/08/23    Authorization Type [employer]    OT Start Time 1151    OT Stop Time 1237    OT Time Calculation (min) 46 min    Equipment Utilized During Treatment --    Activity Tolerance Patient tolerated treatment well;No increased pain;Patient limited by pain;Patient limited by fatigue    Behavior During Therapy [facility] for tasks assessed/performed               Past Medical History:  Diagnosis Date   Age-related macular degeneration, dry, left eye    Alcohol dependence (HCC)    Arthritis    "maybe a little bit in my left knee and right forefinger" (05/30/2015)   Atrial fibrillation (HCC)    ablation 05-2015 with no reoccurance as of 09-2015   Basal cell carcinoma    Depression    hx   Family history of adverse reaction to anesthesia    "father had head injuries and dementia; everytime he was put under less of his mentation came back"   Gastroparesis    GERD (gastroesophageal reflux disease)    History of kidney stones    Hypercholesterolemia dx'd 02/2015   Hypertension    Hypothyroidism    Kidney stones    Migraine aura without headache    "very rare now" (05/30/2015)   Pneumonia ~ 2004   Squamous carcinoma    hand, chest   Past Surgical History:  Procedure Laterality Date   ANTERIOR CERVICAL DECOMP/DISCECTOMY FUSION  04/2009   ATRIAL FIBRILLATION ABLATION  05/30/2015   BACK SURGERY     BASAL CELL CARCINOMA EXCISION     chest or back or left arm   CARDIOVERSION  06/2002   CATARACT EXTRACTION W/ INTRAOCULAR LENS IMPLANT Left ~ 2012   COLONOSCOPY     COLONOSCOPY W/ POLYPECTOMY  02/2003   CYSTOSCOPY W/ STONE MANIPULATION   1980's   "basketted it out; no stent"   ELECTROPHYSIOLOGIC STUDY N/A 05/30/2015   Procedure: Atrial Fibrillation Ablation;  Surgeon: Will Jorja Loa, MD;  Location: [facility];  Service: Cardiovascular;  Laterality: N/A;   EXTRACORPOREAL SHOCK WAVE LITHOTRIPSY Left 04/12/2016   Procedure: LEFT EXTRACORPOREAL SHOCK WAVE LITHOTRIPSY (ESWL);  Surgeon: Bjorn Pippin, MD;  Location: [facility];  Service: Urology;  Laterality: Left;   INGUINAL HERNIA REPAIR Bilateral 2004   INGUINAL HERNIA REPAIR Bilateral 2004   MOHS SURGERY Right    "temple"   SQUAMOUS CELL CARCINOMA EXCISION     chest or back or left arm   Patient Active Problem List   Diagnosis Date Noted   DOE (dyspnea on exertion) 02/03/2023   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 02/03/2023   Encounter for general adult medical examination with abnormal findings 02/03/2023   Moderate episode of recurrent major depressive disorder (HCC) 01/19/2022   Hearing loss due to cerumen impaction, bilateral 07/13/2021   Panlobular emphysema (HCC) 01/07/2021   Neuropathy involving both lower extremities 04/18/2020   Seasonal allergic rhinitis due to pollen 10/24/2018   Essential hypertension 04/25/2018   Laryngopharyngeal reflux (LPR) 02/07/2018   Kidney stone on left side 03/30/2016  IBS (irritable bowel syndrome) 06/27/2015   Coronary artery disease involving native coronary artery of native heart without angina pectoris 06/26/2015   OAB (overactive bladder) 04/16/2015   Atrial flutter (HCC)    Hyperlipidemia with target LDL less than 70 01/18/2014   BPH (benign prostatic hyperplasia) 04/23/2011   Hypothyroidism 04/15/2011   Routine general medical examination at a health care facility 04/15/2011   Atrial fibrillation (HCC)     ONSET DATE: approx 3 months ago (fall in Sept 2024)   REFERRING DIAG: Z61.096 (ICD-10-CM) - Pain in left hand   THERAPY DIAG:  Localized edema  Other lack of coordination  Pain in joint of left  hand  Muscle weakness (generalized)  Stiffness of left hand, not elsewhere classified  Acute pain of left shoulder  Rationale for Evaluation and Treatment: Rehabilitation  PERTINENT HISTORY: Per referral: "Left hand index/ring finger PIP sprain; ROM,strengthening"   "X-rays were reviewed today, there is concern for a chronic avulsion fracture of the index finger proximal phalanx at the level of the PIP joint, there is a small corticated dorsal fragment notable in this region which correlates with his injury history and ongoing swelling and pain at this region. There is no significant instability at this region, he has appropriate flexion and extension at the PIP joint, his flexion is somewhat limited secondary to pain. He likely has a significant PIP sprain of the left index finger which should respond to ongoing therapy. We will have him seen by occupational therapy in the near future to begin range of motion exercises with progression of strengthening as tolerated. He is welcome to return to me as needed moving forward. "  He states 2-3 months ago falling in shower while washing his dog. About 4 weeks after that. He had surgery to Lt hand for skin cancer removal/biopsy and was put in bulky "cast."   He doesn't have much pain now, unless he squeezes or weight bearing, but is mostly concerned with stiffness.    PRECAUTIONS: None ; RED FLAGS: None   WEIGHT BEARING RESTRICTIONS: No   SUBJECTIVE:   SUBJECTIVE STATEMENT:  He states the dynamic flexion orthosis initially was working but seems to be more difficult to use now.  He feels like he gets more benefit from stretches and his relative motion orthosis.    PAIN:  Are you having pain?  Not today  PATIENT GOALS: Improve the movement and use of his left nondominant hand  NEXT MD VISIT: PRN   OBJECTIVE: (All objective assessments below are from initial evaluation on: 02/22/23 unless otherwise specified.)   HAND DOMINANCE: Right    ADLs: Overall ADLs: States decreased ability to grab, hold household objects, pain and difficulty to open containers, perform FMS tasks (manipulate fasteners on clothing)  FUNCTIONAL OUTCOME MEASURES: Eval: Quick DASH 20% impairment today  (Higher % Score  =  More Impairment)    UPPER EXTREMITY ROM     Shoulder to Wrist AROM Left eval Lt 02/28/23  Wrist flexion 80 70  Wrist extension 55 50  (Blank rows = not tested)   Hand AROM Left eval Lt 02/28/23 LT 03/08/23  Full Fist Ability (or Gap to Distal Palmar Crease) 5cm gap from tip Lt IF to [facility] (RF touches palm loosely)  4.8cm gap from tip Lt IF to [facility]  4.6cm gap from tip IF to [facility]  Thumb Opposition  (Kapandji Scale)  9/10    Index MCP (0-90) 0-  56 0 - 64 0 - 59  (70  after manual therapy)  Index PIP (0-100) (+3) -57 0 - 71 0 - 80 (82 after manual therapy)  Index DIP (0-70) (-18*) -  40 (-18) - 40 (-18) - 43 (47 after manual therapy)   Ring MCP (0-90) 0 - 78 0 - 77 0 - 88  Ring PIP (0-100) (-20) -  92 (-7) - 92 (-11) - 91  Ring DIP (0-70) (+3) -  36 (+3) - 55 (+2) - 44  (Blank rows = not tested)   UPPER EXTREMITY MMT:    Eval:  NT at eval due to recent and still healing injuries. Will be tested when appropriate.   MMT Left TBD  Wrist flexion   Wrist extension   IF flex/ext   RF flex/ext   (Blank rows = not tested)  HAND FUNCTION: 03/08/23: Grip strength Right: 68 lbs, Left: 38.6 lbs   COORDINATION: 03/15/23: 9 Hole Peg Test Left: TBD sec (TBD sec is WFL)   EDEMA:   Eval:  Mildly swollen in left hand and index and ring fingers today, some of this presenting as arthritis in the MCP joints and likely some from stiffness and immobilization  OBSERVATIONS:   Eval: He has apparent swan necking in the left index finger and apparent pseudo boutonniere injury in the left ring finger though these things are very mild.  It is quite interesting due to the mechanism of injury his index finger should be a true boutonniere  deformity, but perhaps due to the surgery he had after his injury and the subsequent wrapping and immobilization he healed an extended PIP posture.  Now he presents is largely healed which is a good thing just very stiff with capsular tightness in the index finger (positive test of intrinsic tightness), and less so in the ring finger but some intrinsic tightness present there.     TODAY'S TREATMENT:  03/08/23: The relative motion orthosis does not need adjusted.  The new dynamic flexion orthosis was checked with him and he is wearing appropriately but now his fingers are more flexible and he is maximizing the benefit from this type of orthosis now.  He was told that he can continue to wear it once or twice a day for 25 to 30 minutes or focus more on his relative motion orthosis and manual stretches.  His HEP was reviewed, and OT does manual stretches with him-measurements improved significantly after only a few stretches.  Lastly, he is supplied with pink therapy putty as he tolerates isometric gripping today with no significant pain.  He was given new gripping and pinching activities to do in therapy putty to hopefully help with motion and strengthening.  These are bolded below, he performs them with no significant pain.  Exercises - Wrist Prayer Stretch  - 4 x daily - 3-5 reps - 15 sec hold - Tendon Glides  - 4-6 x daily - 3-5 reps - 2-3 seconds hold - Tip Joint Blocking Motion  - 4-6 x daily - 10-15 reps - Hand AROM Reverse Blocking  - 4-6 x daily - 10-15 reps - Hand AROM PIP Blocking  - 4-6 x daily - 10-15 reps - BACK KNUCKLE STRETCHES   - 4 x daily - 3-5 reps - 15 sec hold - HOOK Stretch  - 4 x daily - 3-5 reps - 15-20 sec hold - Seated Finger Composite Flexion Stretch  - 4 x daily - 3-5 reps - 15 hold - Full Fist  - 2-3 x daily - 5 reps -  Thumb Opposition with Putty  - 2-3 x daily - 5 reps - Seated Claw Fist with Putty  - 2-3 x daily - 5 reps   PATIENT EDUCATION: Education details: See tx  section above for details  Person educated: Patient Education method: Verbal Instruction, Teach back, Handouts  Education comprehension: States and demonstrates understanding, Additional Education required    HOME EXERCISE PROGRAM: Access Code: [email or web address] URL: [email or web address] Date: 02/22/2023 Prepared by: Fannie Knee   GOALS: Goals reviewed with patient? Yes   SHORT TERM GOALS: (STG required if POC>30 days) Target Date: 03/11/23  Pt will obtain protective, custom orthotic. Goal status: 02/28/23: MET 02/22/23: partially met- will need dynamic orthosis in next 1-2 sessions as well   2.  Pt will demo/state understanding of initial HEP to improve pain levels and prerequisite motion. Goal status: 03/08/2023: Met   LONG TERM GOALS: Target Date: 04/08/23  Pt will improve functional ability by decreased impairment per Quick DASH assessment from 20% to 10% or better, for better quality of life. Goal status: INITIAL  2.  Pt will improve grip strength in Lt hand from unable, stiff to at least 35lbs for functional use at home and in IADLs. Goal status: INITIAL  3.  Pt will improve A/ROM in Lt IF TAM from 138* to at least 200*, to have functional motion for tasks like grasp.  Goal status: INITIAL  4.  Pt will improve A/ROM in Lt RF TAM from 189* to at least 220*, to have functional motion for tasks like grasp.  Goal status: INITIAL  5.  Pt will improve coordination skills in Lt hand, as seen by [facility] score on 9HPT testing to have increased functional ability to carry out fine motor tasks (fasteners, etc.) and more complex, coordinated IADLs (meal prep, sports, etc.).  Goal status: INITIAL   ASSESSMENT:  CLINICAL IMPRESSION: 03/08/23: He now understands his home exercise program very well and was upgraded to light grip and pinch training.  Overall he continues to improve composite fist by several millimeters each week, and his fingers are getting somewhat better  and are better with manual therapy and stretches.  This issue is compounded by some underlying arthritis.  Continue on    PLAN:  OT FREQUENCY: 1-2x/week  OT DURATION: 6 weeks  PLANNED INTERVENTIONS: 97168 OT Re-evaluation, 97535 self care/ADL training, 54098 therapeutic exercise, 97530 therapeutic activity, 97112 neuromuscular re-education, 97140 manual therapy, 97035 ultrasound, 97010 moist heat, 97010 cryotherapy, 97034 contrast bath, 97760 Orthotics management and training, 11914 Splinting (initial encounter), [id number] Subsequent splinting/medication, compression bandaging, Dry needling, energy conservation, coping strategies training, and patient/family education  RECOMMENDED OTHER SERVICES: none now   CONSULTED AND AGREED WITH PLAN OF CARE: Patient  PLAN FOR NEXT SESSION:   Check range of motion, check grip strength perform manual therapy and modalities as helpful progress until patient meets goals   Fannie Knee, OTR/L, CHT 03/08/2023, 1:16 PM

## 2023-03-14 NOTE — Therapy (Signed)
OUTPATIENT OCCUPATIONAL THERAPY TREATMENT & DISCHARGE NOTE  Patient Name: Roy Long MRN: [id number] DOB:1950/07/14, 72 y.o., male Today's Date: 03/15/2023  PCP: Sanda Linger, MD REFERRING PROVIDER:  Samuella Cota, MD          OCCUPATIONAL THERAPY DISCHARGE SUMMARY  Visits from Start of Care: 4  03/15/23: He is either met all of his goals or come very close to meeting them.  He has no more pain or functional deficits, he has been lifting weights, pushing and pulling and demonstrating a fairly full and good fist.  At this point OT feels that more progress will just take time and he can continue doing his home exercise program independently.  Discharge today  Education / Equipment: Pt has all needed materials and education. Pt understands how to continue on with self-management. See tx notes for more details.   Patient agrees to discharge due to max benefits received from outpatient occupational therapy / hand therapy at this time.   Fannie Knee, OTR/L, CHT 03/15/2023       END OF SESSION:  OT End of Session - 03/15/23 1145     Visit Number 4    Number of Visits 12    Date for OT Re-Evaluation 04/08/23    Authorization Type [employer]    OT Start Time 1145    OT Stop Time 1234    OT Time Calculation (min) 49 min    Equipment Utilized During Treatment buddy straps    Activity Tolerance Patient tolerated treatment well;No increased pain;Patient limited by fatigue    Behavior During Therapy [facility] for tasks assessed/performed              Past Medical History:  Diagnosis Date   Age-related macular degeneration, dry, left eye    Alcohol dependence (HCC)    Arthritis    "maybe a little bit in my left knee and right forefinger" (05/30/2015)   Atrial fibrillation (HCC)    ablation 05-2015 with no reoccurance as of 09-2015   Basal cell carcinoma    Depression    hx   Family history of adverse reaction to anesthesia    "father had head injuries and  dementia; everytime he was put under less of his mentation came back"   Gastroparesis    GERD (gastroesophageal reflux disease)    History of kidney stones    Hypercholesterolemia dx'd 02/2015   Hypertension    Hypothyroidism    Kidney stones    Migraine aura without headache    "very rare now" (05/30/2015)   Pneumonia ~ 2004   Squamous carcinoma    hand, chest   Past Surgical History:  Procedure Laterality Date   ANTERIOR CERVICAL DECOMP/DISCECTOMY FUSION  04/2009   ATRIAL FIBRILLATION ABLATION  05/30/2015   BACK SURGERY     BASAL CELL CARCINOMA EXCISION     chest or back or left arm   CARDIOVERSION  06/2002   CATARACT EXTRACTION W/ INTRAOCULAR LENS IMPLANT Left ~ 2012   COLONOSCOPY     COLONOSCOPY W/ POLYPECTOMY  02/2003   CYSTOSCOPY W/ STONE MANIPULATION  1980's   "basketted it out; no stent"   ELECTROPHYSIOLOGIC STUDY N/A 05/30/2015   Procedure: Atrial Fibrillation Ablation;  Surgeon: Will Jorja Loa, MD;  Location: [facility] IN[facility]ASIVE [facility];  Service: Cardiovascular;  Laterality: N/A;   EXTRACORPOREAL SHOCK WAVE LITHOTRIPSY Left 04/12/2016   Procedure: LEFT EXTRACORPOREAL SHOCK WAVE LITHOTRIPSY (ESWL);  Surgeon: Bjorn Pippin, MD;  Location: [facility] OR[facility];  Service: Urology;  Laterality:  Left;   INGUINAL HERNIA REPAIR Bilateral 2004   INGUINAL HERNIA REPAIR Bilateral 2004   MOHS SURGERY Right    "temple"   SQUAMOUS CELL CARCINOMA EXCISION     chest or back or left arm   Patient Active Problem List   Diagnosis Date Noted   DOE (dyspnea on exertion) 02/03/2023   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 02/03/2023   Encounter for general adult medical examination with abnormal findings 02/03/2023   Moderate episode of recurrent major depressive disorder (HCC) 01/19/2022   Hearing loss due to cerumen impaction, bilateral 07/13/2021   Panlobular emphysema (HCC) 01/07/2021   Neuropathy involving both lower extremities 04/18/2020   Seasonal allergic rhinitis due to pollen 10/24/2018    Essential hypertension 04/25/2018   Laryngopharyngeal reflux (LPR) 02/07/2018   Kidney stone on left side 03/30/2016   IBS (irritable bowel syndrome) 06/27/2015   Coronary artery disease involving native coronary artery of native heart without angina pectoris 06/26/2015   OAB (overactive bladder) 04/16/2015   Atrial flutter (HCC)    Hyperlipidemia with target LDL less than 70 01/18/2014   BPH (benign prostatic hyperplasia) 04/23/2011   Hypothyroidism 04/15/2011   Routine general medical examination at a health care facility 04/15/2011   Atrial fibrillation (HCC)     ONSET DATE: approx 3 months ago (fall in Sept 2024)   REFERRING DIAG: G95.621 (ICD-10-CM) - Pain in left hand   THERAPY DIAG:  Localized edema  Other lack of coordination  Pain in joint of left hand  Muscle weakness (generalized)  Stiffness of left hand, not elsewhere classified  Acute pain of left shoulder  Rationale for Evaluation and Treatment: Rehabilitation  PERTINENT HISTORY: Per referral: "Left hand index/ring finger PIP sprain; ROM,strengthening"   "X-rays were reviewed today, there is concern for a chronic avulsion fracture of the index finger proximal phalanx at the level of the PIP joint, there is a small corticated dorsal fragment notable in this region which correlates with his injury history and ongoing swelling and pain at this region. There is no significant instability at this region, he has appropriate flexion and extension at the PIP joint, his flexion is somewhat limited secondary to pain. He likely has a significant PIP sprain of the left index finger which should respond to ongoing therapy. We will have him seen by occupational therapy in the near future to begin range of motion exercises with progression of strengthening as tolerated. He is welcome to return to me as needed moving forward. "  He states 2-3 months ago falling in shower while washing his dog. About 4 weeks after that. He had surgery  to Lt hand for skin cancer removal/biopsy and was put in bulky "cast."   He doesn't have much pain now, unless he squeezes or weight bearing, but is mostly concerned with stiffness.    PRECAUTIONS: None ; RED FLAGS: None   WEIGHT BEARING RESTRICTIONS: No   SUBJECTIVE:   SUBJECTIVE STATEMENT:  He states wondering if he made any progress since last week.  He has some anxiety about wanting to achieve all of his goals.  He has not been having any pain recently just stiffness in the index finger mainly, but his ring finger feels fine now.   t PAIN:  Are you having pain?  Not today  PATIENT GOALS: Improve the movement and use of his left nondominant hand  NEXT MD VISIT: PRN   OBJECTIVE: (All objective assessments below are from initial evaluation on: 02/22/23 unless otherwise  specified.)   HAND DOMINANCE: Right   ADLs: Overall ADLs: States decreased ability to grab, hold household objects, pain and difficulty to open containers, perform FMS tasks (manipulate fasteners on clothing)  FUNCTIONAL OUTCOME MEASURES: 03/15/23: Quick DASH 7.5% issues now.   Eval: Quick DASH 20% impairment today  (Higher % Score  =  More Impairment)    UPPER EXTREMITY ROM     Shoulder to Wrist AROM Left eval Lt 02/28/23  Wrist flexion 80 70  Wrist extension 55 50  (Blank rows = not tested)   Hand AROM Left eval Lt 02/28/23 LT 03/08/23 Lt 03/15/23  Full Fist Ability (or Gap to Distal Palmar Crease) 5cm gap from tip Lt IF to [facility] (RF touches palm loosely)  4.8cm gap from tip Lt IF to [facility]  4.6cm gap from tip IF to [facility] 3.8 cm gap   Thumb Opposition  (Kapandji Scale)  9/10     Index MCP (0-90) 0-  56 0 - 64 0 - 59  (70 after manual therapy) 0 - 70  Index PIP (0-100) (+3) -57 0 - 71 0 - 80 (82 after manual therapy) 0 - 89  Index DIP (0-70) (-18*) -  40 (-18) - 40 (-18) - 43 (47 after manual therapy)  (-18) - 39  Ring MCP (0-90) 0 - 78 0 - 77 0 - 88 0 - 92  Ring PIP (0-100) (-20) -  92 (-7) - 92 (-11) -  91 0 -  93  Ring DIP (0-70) (+3) -  36 (+3) - 55 (+2) - 44 0 - 50  (Blank rows = not tested)   UPPER EXTREMITY MMT:     MMT Left 03/15/23  Wrist flexion 5/5  Wrist extension 5/5  IF flex/ext 4+/5   /   4/5  RF flex/ext 5/5  /  4/5  (Blank rows = not tested)  HAND FUNCTION: 03/15/23: Grip Lt: 37#   03/08/23: Grip strength Right: 68 lbs, Left: 38.6 lbs   COORDINATION: 03/15/23: 9 Hole Peg Test Left: 22sec (23.8 sec is AVG)   OBSERVATIONS:   03/15/2023: He has no more apparent pseudo boutonniere deformity in the ring finger, and this fingers moving normally.  His index finger is no longer swollen necking presents almost more like a chronic mallet injury with a droopy DIP joint-perhaps arthritis in there.  Fortunately he has no pain now and his motion is greatly improved.  He continues to tighten up and swell a bit overnight but he can manage this on his own and every day and every week he continues to get better.     Eval: He has apparent swan necking in the left index finger and apparent pseudo boutonniere injury in the left ring finger though these things are very mild.  It is quite interesting due to the mechanism of injury his index finger should be a true boutonniere deformity, but perhaps due to the surgery he had after his injury and the subsequent wrapping and immobilization he healed an extended PIP posture.  Now he presents is largely healed which is a good thing just very stiff with capsular tightness in the index finger (positive test of intrinsic tightness), and less so in the ring finger but some intrinsic tightness present there.     TODAY'S TREATMENT:  03/15/23: Pt performs AROM, gripping, and strength with Lt hand/fingers/wrist against therapist's resistance for exercise/activities as well as new measures today. OT also discusses home and functional tasks with the pt and reviews  goals.  Fortunately he states little to no problems at home or with IADLs now but some's mild  problems with coordination issues.  He performed the nine-hole peg test today which showed he had excellent coordination for man of his age with his nondominant hand.  His grip strength did not significantly improve but during the course of today's session OT noted that using the therapy putty with review greatly improved his range of motion.  OT also reviews his other home exercises as well and provides updated recommendations and upgrades to full arm progressive resistive exercises as below.  He did this with a buddy strap around his index and middle fingers to to help increase motion and tension through the index finger, which was accomplished in a nonpainful fashion.  He was cautioned not to wear his buddy strap all day as it could increase in swelling symptoms.  Pt states understanding, is happy that he actually did make progress and he tolerates progressive resistance well with no pain.   He states with today's new education and information he would feel confident and comfortable discharging therapy now, though he was warned that swelling and stiffness could linger for an additional 1 to 2 months and he needs to just "keep ongoing" with his home exercises.   Bicep curls 20 pounds x 10 Tricep presses 25 pounds x 10 Baseball bat swing activity with 3 pound dowel rod x 15 Simulated shoveling activity with offset 3 pound dowel rod x 15 Chest press activity 10 pounds x 15   PATIENT EDUCATION: Education details: See tx section above for details  Person educated: Patient Education method: Verbal Instruction, Teach back, Handouts  Education comprehension: States and demonstrates understanding   HOME EXERCISE PROGRAM: Access Code: [email or web address] URL: [email or web address] Date: 02/22/2023 Prepared by: Fannie Knee   GOALS: Goals reviewed with patient? Yes   SHORT TERM GOALS: (STG required if POC>30 days) Target Date: 03/11/23  Pt will obtain protective, custom orthotic. Goal  status: 02/28/23: MET 02/22/23: partially met- will need dynamic orthosis in next 1-2 sessions as well   2.  Pt will demo/state understanding of initial HEP to improve pain levels and prerequisite motion. Goal status: 03/08/2023: Met   LONG TERM GOALS: Target Date: 04/08/23  Pt will improve functional ability by decreased impairment per Quick DASH assessment from 20% to 10% or better, for better quality of life. Goal status: 03/15/23: MET  2.  Pt will improve grip strength in Lt hand from unable, stiff to at least 35lbs for functional use at home and in IADLs. Goal status: 03/15/23: MET  3.  Pt will improve A/ROM in Lt IF TAM from 138* to at least 200*, to have functional motion for tasks like grasp.  Goal status: 03/15/23: Partially MET- 180* now  4.  Pt will improve A/ROM in Lt RF TAM from 189* to at least 220*, to have functional motion for tasks like grasp.  Goal status: 03/15/23: MET 235*   5.  Pt will improve coordination skills in Lt hand, as seen by [facility] score on 9HPT testing to have increased functional ability to carry out fine motor tasks (fasteners, etc.) and more complex, coordinated IADLs (meal prep, sports, etc.).  Goal status: 03/15/2023: Met   ASSESSMENT:  CLINICAL IMPRESSION: 03/15/23: He is either met all of his goals or come very close to meeting them.  He has no more pain or functional deficits, he has been lifting weights, pushing and pulling and demonstrating a fairly full and good fist.  At this point OT feels that more progress will just take time and he can continue doing his home exercise program independently.  Discharge today     PLAN:  OT FREQUENCY: & OT DURATION: Discharge  PLANNED INTERVENTIONS: 97168 OT Re-evaluation, 97535 self care/ADL training, 82956 therapeutic exercise, 97530 therapeutic activity, 97112 neuromuscular re-education, 97140 manual therapy, 97035 ultrasound, 97010 moist heat, 97010 cryotherapy, 97034 contrast bath, 97760 Orthotics  management and training, 21308 Splinting (initial encounter), [id number] Subsequent splinting/medication, compression bandaging, Dry needling, energy conservation, coping strategies training, and patient/family education  RECOMMENDED OTHER SERVICES: none now   CONSULTED AND AGREED WITH PLAN OF CARE: Patient  PLAN FOR NEXT SESSION:   D/C   Fannie Knee, OTR/L, CHT 03/15/2023, 12:55 PM

## 2023-03-15 ENCOUNTER — Encounter: Payer: Self-pay | Admitting: Rehabilitative and Restorative Service Providers"

## 2023-03-15 ENCOUNTER — Ambulatory Visit: Payer: Medicare PPO | Admitting: Rehabilitative and Restorative Service Providers"

## 2023-03-15 DIAGNOSIS — R278 Other lack of coordination: Secondary | ICD-10-CM | POA: Diagnosis not present

## 2023-03-15 DIAGNOSIS — R600 Localized edema: Secondary | ICD-10-CM | POA: Diagnosis not present

## 2023-03-15 DIAGNOSIS — M25642 Stiffness of left hand, not elsewhere classified: Secondary | ICD-10-CM

## 2023-03-15 DIAGNOSIS — M25512 Pain in left shoulder: Secondary | ICD-10-CM | POA: Diagnosis not present

## 2023-03-15 DIAGNOSIS — M6281 Muscle weakness (generalized): Secondary | ICD-10-CM

## 2023-03-15 DIAGNOSIS — M25542 Pain in joints of left hand: Secondary | ICD-10-CM | POA: Diagnosis not present

## 2023-03-17 DIAGNOSIS — D225 Melanocytic nevi of trunk: Secondary | ICD-10-CM | POA: Diagnosis not present

## 2023-03-17 DIAGNOSIS — D044 Carcinoma in situ of skin of scalp and neck: Secondary | ICD-10-CM | POA: Diagnosis not present

## 2023-03-17 DIAGNOSIS — L821 Other seborrheic keratosis: Secondary | ICD-10-CM | POA: Diagnosis not present

## 2023-03-17 DIAGNOSIS — L570 Actinic keratosis: Secondary | ICD-10-CM | POA: Diagnosis not present

## 2023-03-17 DIAGNOSIS — C44319 Basal cell carcinoma of skin of other parts of face: Secondary | ICD-10-CM | POA: Diagnosis not present

## 2023-03-17 DIAGNOSIS — C44329 Squamous cell carcinoma of skin of other parts of face: Secondary | ICD-10-CM | POA: Diagnosis not present

## 2023-03-17 DIAGNOSIS — Z85828 Personal history of other malignant neoplasm of skin: Secondary | ICD-10-CM | POA: Diagnosis not present

## 2023-03-17 DIAGNOSIS — L814 Other melanin hyperpigmentation: Secondary | ICD-10-CM | POA: Diagnosis not present

## 2023-05-10 DIAGNOSIS — C44319 Basal cell carcinoma of skin of other parts of face: Secondary | ICD-10-CM | POA: Diagnosis not present

## 2023-05-10 DIAGNOSIS — Z85828 Personal history of other malignant neoplasm of skin: Secondary | ICD-10-CM | POA: Diagnosis not present

## 2023-06-08 ENCOUNTER — Other Ambulatory Visit: Payer: Self-pay | Admitting: Internal Medicine

## 2023-06-08 DIAGNOSIS — E039 Hypothyroidism, unspecified: Secondary | ICD-10-CM

## 2023-06-08 DIAGNOSIS — J431 Panlobular emphysema: Secondary | ICD-10-CM

## 2023-06-08 DIAGNOSIS — N400 Enlarged prostate without lower urinary tract symptoms: Secondary | ICD-10-CM

## 2023-07-14 NOTE — Progress Notes (Signed)
  Electrophysiology Office Note:   Date:  07/15/2023  ID:  HAMDAN Long, DOB May 15, 1950, MRN [id number]  Primary Cardiologist: None Electrophysiologist: Will Gladis Norton, MD      History of Present Illness:   Roy Long is a 73 y.o. male with h/o PAF s/p ablation, HTN, COPD, and chronic dyspnea/fatigue seen today for routine electrophysiology followup.   Since last being seen in our clinic the patient reports doing well from a cardiac perspective. No breakthrough AF. Still having some chronic dyspnea. Otherwise,  he denies chest pain, palpitations, PND, orthopnea, nausea, vomiting, dizziness, syncope, edema, weight gain, or early satiety.   Review of systems complete and found to be negative unless listed in HPI.   EP Information / Studies Reviewed:    EKG is ordered today. Personal review as below.  EKG Interpretation Date/Time:  Friday July 15 2023 12:20:05 EDT Ventricular Rate:  68 PR Interval:  176 QRS Duration:  72 QT Interval:  386 QTC Calculation: 410 R Axis:   11  Text Interpretation: Normal sinus rhythm Anteroseptal infarct (cited on or before 07-Mar-2015) When compared with ECG of 27-May-2016 15:07, Questionable change in initial forces of Anterior leads Confirmed by Lesia Sharper [phone]) on 07/15/2023 12:25:34 PM    Arrhythmia/Device History AFib Ablation 05/30/2015   Physical Exam:   VS:  BP 132/68   Pulse 68   Ht 5' 8 (1.727 m)   Wt 133 lb 3.2 oz (60.4 kg)   SpO2 98%   BMI 20.25 kg/m    Wt Readings from Last 3 Encounters:  07/15/23 133 lb 3.2 oz (60.4 kg)  02/03/23 133 lb 12.8 oz (60.7 kg)  09/21/22 136 lb (61.7 kg)     GEN: No acute distress NECK: No JVD; No carotid bruits CARDIAC: Regular rate and rhythm, no murmurs, rubs, gallops RESPIRATORY:  Clear to auscultation without rales, wheezing or rhonchi  ABDOMEN: Soft, non-tender, non-distended EXTREMITIES:  No edema; No deformity   ASSESSMENT AND PLAN:    Paroxysmal Atrial Fibrillation   S/p Ablation 2017 EKG today shows NSR Continue Eliquis  5mg  BID for CHA2DS2VASC of at least 2 Continue Diltiazem  240 mg daily    Echo 02/2023 LVEF 60-65%  HTN Stable on current regimen   Secondary hypercoagulable state Pt on Eliquis  as above   Chronic dyspnea Cardiac work up has been repeatedly reassuring.  Likely multifactorial   Follow up with EP APP in 12 months  Signed, Sharper Prentice Lesia, PA-C

## 2023-07-15 ENCOUNTER — Ambulatory Visit: Payer: Medicare PPO | Attending: Cardiology | Admitting: Student

## 2023-07-15 ENCOUNTER — Encounter: Payer: Self-pay | Admitting: Student

## 2023-07-15 VITALS — BP 132/68 | HR 68 | Ht 68.0 in | Wt 133.2 lb

## 2023-07-15 DIAGNOSIS — I10 Essential (primary) hypertension: Secondary | ICD-10-CM | POA: Diagnosis not present

## 2023-07-15 DIAGNOSIS — I4819 Other persistent atrial fibrillation: Secondary | ICD-10-CM

## 2023-07-15 NOTE — Patient Instructions (Signed)
 Medication Instructions:  Your physician recommends that you continue on your current medications as directed. Please refer to the Current Medication list given to you today.  *If you need a refill on your cardiac medications before your next appointment, please call your pharmacy*  Lab Work: None ordered If you have labs (blood work) drawn today and your tests are completely normal, you will receive your results only by: MyChart Message (if you have MyChart) OR A paper copy in the mail If you have any lab test that is abnormal or we need to change your treatment, we will call you to review the results.  Follow-Up: At [facility], you and your health needs are our priority.  As part of our continuing mission to provide you with exceptional heart care, our providers are all part of one team.  This team includes your primary Cardiologist (physician) and Advanced Practice Providers or APPs (Physician Assistants and Nurse Practitioners) who all work together to provide you with the care you need, when you need it.  Your next appointment:   1 year(s)  Provider:   Loman Brooklyn, MD      1st Floor: - Lobby - Registration  - Pharmacy  - Lab - Cafe  2nd Floor: - PV Lab - Diagnostic Testing (echo, CT, nuclear med)  3rd Floor: - Vacant  4th Floor: - TCTS (cardiothoracic surgery) - AFib Clinic - Structural Heart Clinic - Vascular Surgery  - Vascular Ultrasound  5th Floor: - HeartCare Cardiology (general and EP) - Clinical Pharmacy for coumadin, hypertension, lipid, weight-loss medications, and med management appointments    Valet parking services will be available as well.

## 2023-07-21 DIAGNOSIS — L570 Actinic keratosis: Secondary | ICD-10-CM | POA: Diagnosis not present

## 2023-07-21 DIAGNOSIS — Z85828 Personal history of other malignant neoplasm of skin: Secondary | ICD-10-CM | POA: Diagnosis not present

## 2023-07-21 DIAGNOSIS — L814 Other melanin hyperpigmentation: Secondary | ICD-10-CM | POA: Diagnosis not present

## 2023-08-15 ENCOUNTER — Ambulatory Visit: Admitting: Orthopedic Surgery

## 2023-08-15 DIAGNOSIS — M25571 Pain in right ankle and joints of right foot: Secondary | ICD-10-CM | POA: Diagnosis not present

## 2023-08-15 DIAGNOSIS — M76821 Posterior tibial tendinitis, right leg: Secondary | ICD-10-CM

## 2023-08-23 ENCOUNTER — Encounter: Payer: Self-pay | Admitting: Orthopedic Surgery

## 2023-08-23 NOTE — Progress Notes (Signed)
 Office Visit Note   Patient: Roy Long           Date of Birth: Sep 24, 1950           MRN: [id number] Visit Date: 08/15/2023              Requested by: Arcadio Knuckles, MD [address] [place],  [place] [address] PCP: Arcadio Knuckles, MD  Chief Complaint  Patient presents with   Right Ankle - Pain      HPI: Patient is a 73 year old gentleman who presents with chronic right foot and ankle pain.  Patient did see Dr. Vaughn Georges last year for ankle instability.  Patient states he has had symptoms for over a year.  Recently he has been doing yard work and afterwards had increased pain due to being on uneven terrain.  Patient states he has ankle pain for about a year he wore an ASO.  Assessment & Plan: Visit Diagnoses:  1. Pain in right ankle and joints of right foot   2. Posterior tibial tendon dysfunction (PTTD) of right lower extremity     Plan: Discussed that patient has posterior tibial tendon dysfunction.  Recommended toe raises and strengthening.  Recommended sole orthotics to support the arch.  Use the ASO to provide stability as needed.  Follow-Up Instructions: No follow-ups on file.   Ortho Exam  Patient is alert, oriented, no adenopathy, well-dressed, normal affect, normal respiratory effort. Examination patient has a good dorsalis pedis pulse.  Patient has pronation of the forefoot and valgus of the hindfoot.  With toe raises he is able to bring the calcaneus to neutral without varus.  There is posterior tibial tendon function with weakness.  Radiograph shows a congruent tibiotalar joint there are no cystic changes no spurs.  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  Imaging: No results found. No images are attached to the encounter.  Labs: Lab Results  Component Value Date   HGBA1C 5.5 04/18/2020   HGBA1C 5.1 04/15/2011     Lab Results  Component Value Date   ALBUMIN 4.5 02/03/2023   ALBUMIN 4.5 01/19/2022   ALBUMIN 4.3 10/06/2020    Lab  Results  Component Value Date   MG 1.9 03/07/2015   Lab Results  Component Value Date   VD25OH 43.65 02/07/2018    No results found for: "PREALBUMIN"    Latest Ref Rng & Units 08/02/2022   10:38 AM 10/27/2021   10:59 AM 07/13/2021    3:06 PM  CBC EXTENDED  WBC 4.0 - 10.5 K/uL 6.3  6.7  6.8   RBC 4.22 - 5.81 Mil/uL 4.51  4.47  4.44   Hemoglobin 13.0 - 17.0 g/dL 19.1  47.8  29.5   HCT 39.0 - 52.0 % 42.8  41.3  41.3   Platelets 150.0 - 400.0 K/uL 180.0  208  203.0   NEUT# 1.4 - 7.7 K/uL 2.7   3.1   Lymph# 0.7 - 4.0 K/uL 2.9   2.9      There is no height or weight on file to calculate BMI.  Orders:  No orders of the defined types were placed in this encounter.  No orders of the defined types were placed in this encounter.    Procedures: No procedures performed  Clinical Data: No additional findings.  ROS:  All other systems negative, except as noted in the HPI. Review of Systems  Objective: Vital Signs: There were no vitals taken for this visit.  Specialty Comments:  No specialty comments available.  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 02/03/2023   Encounter for general adult medical examination with abnormal findings 02/03/2023   Moderate episode of recurrent major depressive disorder (HCC) 01/19/2022   Hearing loss due to cerumen impaction, bilateral 07/13/2021   Panlobular emphysema (HCC) 01/07/2021   Neuropathy involving both lower extremities 04/18/2020   Seasonal allergic rhinitis due to pollen 10/24/2018   Essential hypertension 04/25/2018   Laryngopharyngeal reflux (LPR) 02/07/2018   Kidney stone on left side 03/30/2016   IBS (irritable bowel syndrome) 06/27/2015   Coronary artery disease involving native coronary artery of native heart without angina pectoris 06/26/2015   OAB (overactive bladder) 04/16/2015   Atrial flutter (HCC)     Hyperlipidemia with target LDL less than 70 01/18/2014   BPH (benign prostatic hyperplasia) 04/23/2011   Hypothyroidism 04/15/2011   Routine general medical examination at a health care facility 04/15/2011   Atrial fibrillation (HCC)    Past Medical History:  Diagnosis Date   Age-related macular degeneration, dry, left eye    Alcohol dependence (HCC)    Arthritis    "maybe a little bit in my left knee and right forefinger" (05/30/2015)   Atrial fibrillation (HCC)    ablation 05-2015 with no reoccurance as of 09-2015   Basal cell carcinoma    Depression    hx   Family history of adverse reaction to anesthesia    "father had head injuries and dementia; everytime he was put under less of his mentation came back"   Gastroparesis    GERD (gastroesophageal reflux disease)    History of kidney stones    Hypercholesterolemia dx'd 02/2015   Hypertension    Hypothyroidism    Kidney stones    Migraine aura without headache    "very rare now" (05/30/2015)   Pneumonia ~ 2004   Squamous carcinoma    hand, chest    Family History  Problem Relation Age of Onset   Stroke Brother    Cancer Brother        basosquamous cell carcinoma-head   Heart failure Mother    Colon cancer Cousin 50   Heart disease Neg Hx    Hyperlipidemia Neg Hx    Hypertension Neg Hx    Diabetes Neg Hx     Past Surgical History:  Procedure Laterality Date   ANTERIOR CERVICAL DECOMP/DISCECTOMY FUSION  04/2009   ATRIAL FIBRILLATION ABLATION  05/30/2015   BACK SURGERY     BASAL CELL CARCINOMA EXCISION     chest or back or left arm   CARDIOVERSION  06/2002   CATARACT EXTRACTION W/ INTRAOCULAR LENS IMPLANT Left ~ 2012   COLONOSCOPY     COLONOSCOPY W/ POLYPECTOMY  02/2003   CYSTOSCOPY W/ STONE MANIPULATION  1980's   "basketted it out; no stent"   ELECTROPHYSIOLOGIC STUDY N/A 05/30/2015   Procedure: Atrial Fibrillation Ablation;  Surgeon: Will Cortland Ding, MD;  Location: [facility];  Service: Cardiovascular;   Laterality: N/A;   EXTRACORPOREAL SHOCK WAVE LITHOTRIPSY Left 04/12/2016   Procedure: LEFT EXTRACORPOREAL SHOCK WAVE LITHOTRIPSY (ESWL);  Surgeon: Homero Luster, MD;  Location: [facility];  Service: Urology;  Laterality: Left;   INGUINAL HERNIA REPAIR Bilateral 2004   INGUINAL HERNIA REPAIR Bilateral 2004   MOHS SURGERY Right    "temple"   SQUAMOUS CELL CARCINOMA EXCISION  chest or back or left arm   Social History   Occupational History   Occupation: band Runner, broadcasting/film/video    Comment: retired  Tobacco Use   Smoking status: Former    Current packs/day: 0.00    Average packs/day: 3.0 packs/day for 17.0 years (51.0 ttl pk-yrs)    Types: Cigarettes    Start date: 03/29/1966    Quit date: 03/30/1983    Years since quitting: 40.4    Passive exposure: Never   Smokeless tobacco: Never  Vaping Use   Vaping status: Never Used  Substance and Sexual Activity   Alcohol use: No    Alcohol/week: 0.0 standard drinks of alcohol    Comment: 05/30/2015 "recovering alcoholic; dry date 04/19/2011"   Drug use: No    Types: Marijuana, LSD, Mescaline    Comment: 03/07/2015 "stopped all drugs back in the 1980's"   Sexual activity: Not Currently    Birth control/protection: None

## 2023-09-01 ENCOUNTER — Other Ambulatory Visit: Payer: Self-pay | Admitting: Cardiology

## 2023-09-09 ENCOUNTER — Other Ambulatory Visit: Payer: Self-pay | Admitting: Internal Medicine

## 2023-09-09 DIAGNOSIS — E039 Hypothyroidism, unspecified: Secondary | ICD-10-CM

## 2023-09-09 DIAGNOSIS — N400 Enlarged prostate without lower urinary tract symptoms: Secondary | ICD-10-CM

## 2023-09-09 DIAGNOSIS — K219 Gastro-esophageal reflux disease without esophagitis: Secondary | ICD-10-CM

## 2023-09-19 ENCOUNTER — Other Ambulatory Visit: Payer: Self-pay | Admitting: Internal Medicine

## 2023-09-19 DIAGNOSIS — J431 Panlobular emphysema: Secondary | ICD-10-CM

## 2023-09-22 ENCOUNTER — Ambulatory Visit: Admitting: Internal Medicine

## 2023-09-22 ENCOUNTER — Encounter: Payer: Self-pay | Admitting: Internal Medicine

## 2023-09-22 ENCOUNTER — Ambulatory Visit

## 2023-09-22 VITALS — Ht 68.0 in | Wt 126.0 lb

## 2023-09-22 VITALS — BP 132/60 | HR 74 | Temp 97.9°F | Ht 68.0 in | Wt 132.0 lb

## 2023-09-22 DIAGNOSIS — Z0000 Encounter for general adult medical examination without abnormal findings: Secondary | ICD-10-CM | POA: Diagnosis not present

## 2023-09-22 DIAGNOSIS — K219 Gastro-esophageal reflux disease without esophagitis: Secondary | ICD-10-CM | POA: Diagnosis not present

## 2023-09-22 DIAGNOSIS — I10 Essential (primary) hypertension: Secondary | ICD-10-CM | POA: Diagnosis not present

## 2023-09-22 DIAGNOSIS — E039 Hypothyroidism, unspecified: Secondary | ICD-10-CM | POA: Diagnosis not present

## 2023-09-22 DIAGNOSIS — N400 Enlarged prostate without lower urinary tract symptoms: Secondary | ICD-10-CM | POA: Diagnosis not present

## 2023-09-22 DIAGNOSIS — J431 Panlobular emphysema: Secondary | ICD-10-CM | POA: Diagnosis not present

## 2023-09-22 DIAGNOSIS — I2510 Atherosclerotic heart disease of native coronary artery without angina pectoris: Secondary | ICD-10-CM | POA: Diagnosis not present

## 2023-09-22 LAB — CBC WITH DIFFERENTIAL/PLATELET
Basophils Absolute: 0 10*3/uL (ref 0.0–0.1)
Basophils Relative: 0.4 % (ref 0.0–3.0)
Eosinophils Absolute: 0 10*3/uL (ref 0.0–0.7)
Eosinophils Relative: 0.5 % (ref 0.0–5.0)
HCT: 43.8 % (ref 39.0–52.0)
Hemoglobin: 14.8 g/dL (ref 13.0–17.0)
Lymphocytes Relative: 33.2 % (ref 12.0–46.0)
Lymphs Abs: 1.8 10*3/uL (ref 0.7–4.0)
MCHC: 33.9 g/dL (ref 30.0–36.0)
MCV: 92.9 fl (ref 78.0–100.0)
Monocytes Absolute: 0.5 10*3/uL (ref 0.1–1.0)
Monocytes Relative: 9.1 % (ref 3.0–12.0)
Neutro Abs: 3.2 10*3/uL (ref 1.4–7.7)
Neutrophils Relative %: 56.8 % (ref 43.0–77.0)
Platelets: 180 10*3/uL (ref 150.0–400.0)
RBC: 4.71 Mil/uL (ref 4.22–5.81)
RDW: 12.3 % (ref 11.5–15.5)
WBC: 5.6 10*3/uL (ref 4.0–10.5)

## 2023-09-22 LAB — BASIC METABOLIC PANEL WITH GFR
BUN: 17 mg/dL (ref 6–23)
CO2: 28 meq/L (ref 19–32)
Calcium: 9.3 mg/dL (ref 8.4–10.5)
Chloride: 105 meq/L (ref 96–112)
Creatinine, Ser: 0.82 mg/dL (ref 0.40–1.50)
GFR: 86.16 mL/min (ref >60.00)
Glucose, Bld: 100 mg/dL — ABNORMAL HIGH (ref 70–99)
Potassium: 4 meq/L (ref 3.5–5.1)
Sodium: 140 meq/L (ref 135–145)

## 2023-09-22 LAB — TSH: TSH: 3.83 u[IU]/mL (ref 0.35–5.50)

## 2023-09-22 LAB — PSA: PSA: 0.1 ng/mL (ref 0.10–4.00)

## 2023-09-22 MED ORDER — HYDRALAZINE HCL 50 MG PO TABS: 50.0000 mg | ORAL_TABLET | Freq: Three times a day (TID) | ORAL | 1 refills | Status: DC

## 2023-09-22 MED ORDER — LEVOTHYROXINE SODIUM 25 MCG PO TABS: ORAL_TABLET | ORAL | 1 refills | Status: DC

## 2023-09-22 NOTE — Patient Instructions (Signed)

## 2023-09-22 NOTE — Progress Notes (Signed)
 Subjective:   Roy Long is a 73 y.o. who presents for a Medicare Wellness preventive visit.  As a reminder, Annual Wellness Visits don't include a physical exam, and some assessments may be limited, especially if this visit is performed virtually. We may recommend an in-person follow-up visit with your provider if needed.  Visit Complete: Virtual I connected with  Roy Long on 09/22/23 by a audio enabled telemedicine application and verified that I am speaking with the correct person using two identifiers.  Patient Location: Home  Provider Location: Office/Clinic  I discussed the limitations of evaluation and management by telemedicine. The patient expressed understanding and agreed to proceed.  Vital Signs: Because this visit was a virtual/telehealth visit, some criteria may be missing or patient reported. Any vitals not documented were not able to be obtained and vitals that have been documented are patient reported.  VideoDeclined- This patient declined Librarian, academic. Therefore the visit was completed with audio only.  Persons Participating in Visit: Patient.  AWV Questionnaire: Yes: Patient Medicare AWV questionnaire was completed by the patient on 09/22/2023 (partial); I have confirmed that all information answered by patient is correct and no changes since this date.  Cardiac Risk Factors include: advanced age (>27men, >33 women);dyslipidemia;male gender;hypertension     Objective:    Today's Vitals   09/22/23 0808  Weight: 126 lb (57.2 kg)  Height: 5' 8 (1.727 m)   Body mass index is 19.16 kg/m.     09/22/2023    8:08 AM 02/22/2023   10:47 AM 09/21/2022    2:10 PM 03/17/2022    3:47 PM 10/01/2021   10:06 AM 04/25/2018   10:18 AM 09/27/2016    2:18 PM  Advanced Directives  Does Patient Have a Medical Advance Directive? Yes Yes Yes Yes Yes Yes  Yes   Type of Estate agent of [place];Living will Living  will;Healthcare Power of [employer] Power of [place];Living will Healthcare Power of [place];Living will Living will;Healthcare Power of [employer] Power of [place];Living will   Does patient want to make changes to medical advance directive?  No - Patient declined   No - Patient declined Yes (ED - Information included in AVS)    Copy of Healthcare Power of Attorney in Chart? No - copy requested  No - copy requested No - copy requested No - copy requested Yes - validated most recent copy scanned in chart (See row information)       Data saved with a previous flowsheet row definition    Current Medications (verified) Outpatient Encounter Medications as of 09/22/2023  Medication Sig   acyclovir  (ZOVIRAX ) 400 MG tablet TAKE 1 TABLET BY MOUTH EVERY DAY   atorvastatin  (LIPITOR ) 80 MG tablet TAKE 1 TABLET BY MOUTH EVERY DAY   cholecalciferol  (VITAMIN D ) 1000 UNITS tablet Take 1,000 Units by mouth daily.   cycloSPORINE  (RESTASIS ) 0.05 % ophthalmic emulsion Place 1 drop into both eyes 2 (two) times daily.   diltiazem  (CARDIZEM  CD) 240 MG 24 hr capsule TAKE 1 CAPSULE BY MOUTH EVERY DAY   diltiazem  (DILT-XR) 240 MG 24 hr capsule TAKE 1 CAPSULE BY MOUTH ONCE DAILY   ELIQUIS  5 MG TABS tablet TAKE 1 TABLET BY MOUTH TWICE A DAY   famotidine  (PEPCID ) 40 MG tablet TAKE 1 TABLET BY MOUTH EVERYDAY AT BEDTIME   finasteride  (PROSCAR ) 5 MG tablet TAKE 1 TABLET (5 MG TOTAL) BY MOUTH DAILY.   Fluorouracil 5 % SOLN Apply topically as  needed. Per his dermatologist   hydrALAZINE  (APRESOLINE ) 50 MG tablet TAKE 1 TABLET BY MOUTH THREE TIMES A DAY   levothyroxine  (SYNTHROID ) 25 MCG tablet TAKE 1 TABLET BY MOUTH EVERY DAY BEFORE BREAKFAST   mirtazapine  (REMERON ) 7.5 MG tablet TAKE 1 TABLET BY MOUTH AT BEDTIME.   montelukast  (SINGULAIR ) 10 MG tablet TAKE 1 TABLET BY MOUTH EVERYDAY AT BEDTIME   Multiple Vitamins-Minerals (PRESERVISION AREDS PO) Take 1 tablet by mouth daily.   omeprazole  (PRILOSEC) 40 MG  capsule TAKE 1 CAPSULE (40 MG TOTAL) BY MOUTH DAILY.   tamsulosin  (FLOMAX ) 0.4 MG CAPS capsule TAKE 1 CAPSULE BY MOUTH EVERY DAY   Tiotropium Bromide Monohydrate  (SPIRIVA  RESPIMAT) 2.5 MCG/ACT AERS INHALE 2 PUFFS BY MOUTH INTO THE LUNGS DAILY   Vitamins-Lipotropics (LIPOFLAVONOID) TABS Take 1 tablet by mouth in the morning, at noon, and at bedtime.   No facility-administered encounter medications on file as of 09/22/2023.    Allergies (verified) Pneumococcal vaccines, Tape, and Amlodipine    History: Past Medical History:  Diagnosis Date   Age-related macular degeneration, dry, left eye    Alcohol dependence (HCC)    Arthritis    maybe a little bit in my left knee and right forefinger (05/30/2015)   Atrial fibrillation (HCC)    ablation 05-2015 with no reoccurance as of 09-2015   Basal cell carcinoma    Depression    hx   Family history of adverse reaction to anesthesia    father had head injuries and dementia; everytime he was put under less of his mentation came back   Gastroparesis    GERD (gastroesophageal reflux disease)    History of kidney stones    Hypercholesterolemia dx'd 02/2015   Hypertension    Hypothyroidism    Kidney stones    Migraine aura without headache    very rare now (05/30/2015)   Pneumonia ~ 2004   Squamous carcinoma    hand, chest   Past Surgical History:  Procedure Laterality Date   ANTERIOR CERVICAL DECOMP/DISCECTOMY FUSION  04/2009   ATRIAL FIBRILLATION ABLATION  05/30/2015   BACK SURGERY     BASAL CELL CARCINOMA EXCISION     chest or back or left arm   CARDIOVERSION  06/2002   CATARACT EXTRACTION W/ INTRAOCULAR LENS IMPLANT Left ~ 2012   COLONOSCOPY     COLONOSCOPY W/ POLYPECTOMY  02/2003   CYSTOSCOPY W/ STONE MANIPULATION  1980's   basketted it out; no stent   ELECTROPHYSIOLOGIC STUDY N/A 05/30/2015   Procedure: Atrial Fibrillation Ablation;  Surgeon: Will Gladis Norton, MD;  Location: [facility];  Service: Cardiovascular;   Laterality: N/A;   EXTRACORPOREAL SHOCK WAVE LITHOTRIPSY Left 04/12/2016   Procedure: LEFT EXTRACORPOREAL SHOCK WAVE LITHOTRIPSY (ESWL);  Surgeon: Norleen Seltzer, MD;  Location: [facility];  Service: Urology;  Laterality: Left;   INGUINAL HERNIA REPAIR Bilateral 2004   INGUINAL HERNIA REPAIR Bilateral 2004   MOHS SURGERY Right    temple   SQUAMOUS CELL CARCINOMA EXCISION     chest or back or left arm   Family History  Problem Relation Age of Onset   Stroke Brother    Cancer Brother        basosquamous cell carcinoma-head   Heart failure Mother    Colon cancer Cousin 50   Heart disease Neg Hx    Hyperlipidemia Neg Hx    Hypertension Neg Hx    Diabetes Neg Hx    Social History   Socioeconomic History   Marital status:  Single    Spouse name: Not on file   Number of children: 0   Years of education: Not on file   Highest education level: Master's degree (e.g., MA, MS, MEng, MEd, MSW, MBA)  Occupational History   Occupation: band teacher    Comment: retired  Tobacco Use   Smoking status: Former    Current packs/day: 0.00    Average packs/day: 3.0 packs/day for 17.0 years (51.0 ttl pk-yrs)    Types: Cigarettes    Start date: 03/29/1966    Quit date: 03/30/1983    Years since quitting: 40.5    Passive exposure: Never   Smokeless tobacco: Never  Vaping Use   Vaping status: Never Used  Substance and Sexual Activity   Alcohol use: No    Alcohol/week: 0.0 standard drinks of alcohol    Comment: 05/30/2015 recovering alcoholic; dry date 04/19/2011   Drug use: No    Types: Marijuana, LSD, Mescaline    Comment: 03/07/2015 stopped all drugs back in the 1980's   Sexual activity: Not Currently  Other Topics Concern   Not on file  Social History Narrative   Single   Social Drivers of Health   Financial Resource Strain: Low Risk  (09/22/2023)   Overall Financial Resource Strain (CARDIA)    Difficulty of Paying Living Expenses: Not hard at all  Food Insecurity: No Food Insecurity  (09/22/2023)   Hunger Vital Sign    Worried About Running Out of Food in the Last Year: Never true    Ran Out of Food in the Last Year: Never true  Transportation Needs: No Transportation Needs (09/22/2023)   PRAPARE - Administrator, Civil Service (Medical): No    Lack of Transportation (Non-Medical): No  Physical Activity: Sufficiently Active (09/22/2023)   Exercise Vital Sign    Days of Exercise per Week: 7 days    Minutes of Exercise per Session: 50 min  Stress: Stress Concern Present (09/22/2023)   [employer] of Occupational Health - Occupational Stress Questionnaire    Feeling of Stress: To some extent  Social Connections: Moderately Isolated (09/22/2023)   Social Connection and Isolation Panel    Frequency of Communication with Friends and Family: Once a week    Frequency of Social Gatherings with Friends and Family: More than three times a week    Attends Religious Services: Never    Database administrator or Organizations: Yes    Attends Engineer, structural: More than 4 times per year    Marital Status: Never married    Tobacco Counseling Counseling given: No    Clinical Intake:  Pre-visit preparation completed: Yes  Pain : No/denies pain     BMI - recorded: 19.16 Nutritional Risks: None Diabetes: No  Lab Results  Component Value Date   HGBA1C 5.5 04/18/2020   HGBA1C 5.1 04/15/2011     How often do you need to have someone help you when you read instructions, pamphlets, or other written materials from your doctor or pharmacy?: 1 - Never  Interpreter Needed?: No  Information entered by :: Verdie Saba, CMA   Activities of Daily Living     09/22/2023    8:12 AM  In your present state of health, do you have any difficulty performing the following activities:  Hearing? 0  Vision? 0  Difficulty concentrating or making decisions? 0  Walking or climbing stairs? 0  Dressing or bathing? 0  Doing errands, shopping? 0  Preparing  Food and eating ?  N  Using the Toilet? N  In the past six months, have you accidently leaked urine? N  Do you have problems with loss of bowel control? N  Managing your Medications? N  Managing your Finances? N  Housekeeping or managing your Housekeeping? N    Patient Care Team: Joshua Debby CROME, MD as PCP - General (Internal Medicine) Inocencio Soyla Lunger, MD as PCP - Electrophysiology (Cardiology) Lavonia Lye, MD as Consulting Physician (Ophthalmology)  I have updated your Care Teams any recent Medical Services you may have received from other providers in the past year.     Assessment:   This is a routine wellness examination for Creig.  Hearing/Vision screen Hearing Screening - Comments:: Denies hearing difficulties   Vision Screening - Comments:: Wears rx glasses - up to date with routine eye exams with Dr Lavonia   Goals Addressed               This Visit's Progress     Patient Stated (pt-stated)        Patient stated he plans to stay active.       Depression Screen     09/22/2023    8:15 AM 02/03/2023    9:36 AM 09/21/2022    2:07 PM 08/02/2022   10:04 AM 01/12/2022    1:19 PM 10/01/2021   10:11 AM 07/13/2021    1:41 PM  PHQ 2/9 Scores  PHQ - 2 Score 0 0 0 0 2 2 2   PHQ- 9 Score 0  4 5 9 4 2     Fall Risk     09/22/2023    8:13 AM 02/03/2023    9:36 AM 09/21/2022    2:00 PM 09/20/2022    2:36 PM 10/01/2021   10:06 AM  Fall Risk   Falls in the past year? 1 1 1 1  0  Number falls in past yr: 0 1 0 0 0  Comment 1      Injury with Fall? 0 1 0 0 0  Risk for fall due to :  Other (Comment) No Fall Risks  No Fall Risks  Follow up Falls evaluation completed;Falls prevention discussed Falls evaluation completed Falls prevention discussed;Education provided;Falls evaluation completed  Falls evaluation completed      Data saved with a previous flowsheet row definition    MEDICARE RISK AT HOME:  Medicare Risk at Home Any stairs in or around the home?: Yes  (outside) If so, are there any without handrails?: No (none in the front of home but have in the back) Home free of loose throw rugs in walkways, pet beds, electrical cords, etc?: Yes Adequate lighting in your home to reduce risk of falls?: Yes Life alert?: No Use of a cane, walker or w/c?: Yes (walker) Grab bars in the bathroom?: Yes Shower chair or bench in shower?: Yes Elevated toilet seat or a handicapped toilet?: No  TIMED UP AND GO:  Was the test performed?  No  Cognitive Function: 6CIT completed        09/22/2023    8:26 AM 09/21/2022    2:00 PM 10/01/2021   10:15 AM  6CIT Screen  What Year? 0 points 0 points 0 points  What month? 0 points 0 points 0 points  What time? 0 points 0 points 0 points  Count back from 20 0 points 0 points 0 points  Months in reverse 0 points 0 points 0 points  Repeat phrase 0 points 0 points 0 points  Total Score  0 points 0 points 0 points    Immunizations Immunization History  Administered Date(s) Administered   DTaP 11/16/2006   Influenza Split 01/18/2012   Influenza, High Dose Seasonal PF 12/28/2013, 11/17/2016, 11/16/2018, 12/19/2019, 12/03/2020, 12/09/2021   Influenza,trivalent, recombinat, inj, PF 11/16/2017   Influenza-Unspecified 01/07/2016, 12/19/2019, 12/04/2020   PFIZER Comirnaty(Gray Top)Covid-19 Tri-Sucrose Vaccine 06/27/2020   PFIZER(Purple Top)SARS-COV-2 Vaccination 05/10/2019, 06/04/2019, 01/08/2020, 06/27/2020, 12/10/2020   PNEUMOCOCCAL CONJUGATE-20 01/07/2021   Pfizer Covid-19 Vaccine Bivalent Booster 79yrs & up 08/04/2021   Pfizer(Comirnaty)Fall Seasonal Vaccine 12 years and older 07/22/2023   Pneumococcal Polysaccharide-23 04/07/2011   Tdap 02/16/2012, 08/02/2022   Zoster Recombinant(Shingrix ) 10/17/2020, 03/31/2021   Zoster, Live 05/04/2011    Screening Tests Health Maintenance  Topic Date Due   INFLUENZA VACCINE  10/28/2023   COVID-19 Vaccine (9 - 2024-25 season) 01/21/2024   Medicare Annual Wellness (AWV)   09/21/2024   Colonoscopy  10/02/2025   DTaP/Tdap/Td (4 - Td or Tdap) 08/01/2032   Pneumococcal Vaccine: 50+ Years  Completed   Hepatitis C Screening  Completed   Zoster Vaccines- Shingrix   Completed   Hepatitis B Vaccines  Aged Out   HPV VACCINES  Aged Out   Meningococcal B Vaccine  Aged Out    Health Maintenance  There are no preventive care reminders to display for this patient. Health Maintenance Items Addressed: 09/22/2023   Additional Screening:  Vision Screening: Recommended annual ophthalmology exams for early detection of glaucoma and other disorders of the eye. Would you like a referral to an eye doctor? No    Dental Screening: Recommended annual dental exams for proper oral hygiene  Community Resource Referral / Chronic Care Management: CRR required this visit?  No   CCM required this visit?  No   Plan:    I have personally reviewed and noted the following in the patient's chart:   Medical and social history Use of alcohol, tobacco or illicit drugs  Current medications and supplements including opioid prescriptions. Patient is not currently taking opioid prescriptions. Functional ability and status Nutritional status Physical activity Advanced directives List of other physicians Hospitalizations, surgeries, and ER visits in previous 12 months Vitals Screenings to include cognitive, depression, and falls Referrals and appointments  In addition, I have reviewed and discussed with patient certain preventive protocols, quality metrics, and best practice recommendations. A written personalized care plan for preventive services as well as general preventive health recommendations were provided to patient.   Verdie CHRISTELLA Saba, CMA   09/22/2023   After Visit Summary: (MyChart) Due to this being a telephonic visit, the after visit summary with patients personalized plan was offered to patient via MyChart   Notes: Nothing significant to report at this time.

## 2023-09-22 NOTE — Patient Instructions (Addendum)
 Roy Long , Thank you for taking time out of your busy schedule to complete your Annual Wellness Visit with me. I enjoyed our conversation and look forward to speaking with you again next year. I, as well as your care team,  appreciate your ongoing commitment to your health goals. Please review the following plan we discussed and let me know if I can assist you in the future. Your Game plan/ To Do List    Follow up Visits: Next Medicare AWV with our clinical staff: 09/24/2024   Have you seen your provider in the last 6 months (3 months if uncontrolled diabetes)? No Next Office Visit with your provider: 09/22/2023 - Physical  Clinician Recommendations:  Aim for 30 minutes of exercise or brisk walking, 6-8 glasses of water, and 5 servings of fruits and vegetables each day.       This is a list of the screening recommended for you and due dates:  Health Maintenance  Topic Date Due   Flu Shot  10/28/2023   COVID-19 Vaccine (9 - 2024-25 season) 01/21/2024   Medicare Annual Wellness Visit  09/21/2024   Colon Cancer Screening  10/02/2025   DTaP/Tdap/Td vaccine (4 - Td or Tdap) 08/01/2032   Pneumococcal Vaccine for age over 76  Completed   Hepatitis C Screening  Completed   Zoster (Shingles) Vaccine  Completed   Hepatitis B Vaccine  Aged Out   HPV Vaccine  Aged Out   Meningitis B Vaccine  Aged Out    Advanced directives: (Copy Requested) Please bring a copy of your health care power of attorney and living will to the office to be added to your chart at your convenience. You can mail to [facility] [address] [place], [place] [address] or email to [email or web address] Advance Care Planning is important because it:  [x]  Makes sure you receive the medical care that is consistent with your values, goals, and preferences  [x]  It provides guidance to your family and loved ones and reduces their decisional burden about whether or not they are making the right decisions  based on your wishes.  Follow the link provided in your after visit summary or read over the paperwork we have mailed to you to help you started getting your Advance Directives in place. If you need assistance in completing these, please reach out to us  so that we can help you!

## 2023-09-22 NOTE — Progress Notes (Signed)
 Subjective:  Patient ID: Roy Long, male    DOB: 1950/10/06  Age: 73 y.o. MRN: [id number]  CC: Hypothyroidism   HPI Roy Long presents for f/up ----  Discussed the use of AI scribe software for clinical note transcription with the patient, who gave verbal consent to proceed.  History of Present Illness   Roy Long is a 72 year old male with emphysema who presents with shortness of breath on exertion.  He experiences shortness of breath during exertion, particularly in hot weather, which he attributes to his emphysema. He uses an inhaler that helps alleviate his symptoms. No chest pain during activity.  He has a history of thyroid  issues and experienced increased fatigue after changing the timing of his medication intake. Initially, he took omeprazole  first and then levothyroxine , which led to significant fatigue. After reverting to taking both medications simultaneously, his fatigue improved but persists to some extent. No symptoms related to thyroid  medication dose being too high or too low.  No current swelling in his legs or feet, although he experienced swelling in the past, possibly related to an injury or COVID-19, during the winter and spring of 2024.  He has a history of foot and ankle pain, for which he was prescribed inserts by an orthopedic specialist. He notes significant improvement in his symptoms with the use of these supports.  He mentions a general lack of stamina. Occasional coughing and fatigue are present.       Outpatient Medications Prior to Visit  Medication Sig Dispense Refill   acyclovir  (ZOVIRAX ) 400 MG tablet TAKE 1 TABLET BY MOUTH EVERY DAY 90 tablet 1   atorvastatin  (LIPITOR ) 80 MG tablet TAKE 1 TABLET BY MOUTH EVERY DAY 90 tablet 3   cholecalciferol  (VITAMIN D ) 1000 UNITS tablet Take 1,000 Units by mouth daily.     cycloSPORINE  (RESTASIS ) 0.05 % ophthalmic emulsion Place 1 drop into both eyes 2 (two) times daily.     diltiazem  (CARDIZEM   CD) 240 MG 24 hr capsule TAKE 1 CAPSULE BY MOUTH EVERY DAY 90 capsule 3   diltiazem  (DILT-XR) 240 MG 24 hr capsule TAKE 1 CAPSULE BY MOUTH ONCE DAILY 90 capsule 3   ELIQUIS  5 MG TABS tablet TAKE 1 TABLET BY MOUTH TWICE A DAY 180 tablet 1   famotidine  (PEPCID ) 40 MG tablet TAKE 1 TABLET BY MOUTH EVERYDAY AT BEDTIME 90 tablet 1   finasteride  (PROSCAR ) 5 MG tablet TAKE 1 TABLET (5 MG TOTAL) BY MOUTH DAILY. 90 tablet 1   Fluorouracil 5 % SOLN Apply topically as needed. Per his dermatologist     mirtazapine  (REMERON ) 7.5 MG tablet TAKE 1 TABLET BY MOUTH AT BEDTIME. 90 tablet 1   montelukast  (SINGULAIR ) 10 MG tablet TAKE 1 TABLET BY MOUTH EVERYDAY AT BEDTIME 90 tablet 1   Multiple Vitamins-Minerals (PRESERVISION AREDS PO) Take 1 tablet by mouth daily.     omeprazole  (PRILOSEC) 40 MG capsule TAKE 1 CAPSULE (40 MG TOTAL) BY MOUTH DAILY. 90 capsule 1   tamsulosin  (FLOMAX ) 0.4 MG CAPS capsule TAKE 1 CAPSULE BY MOUTH EVERY DAY 90 capsule 0   Tiotropium Bromide Monohydrate  (SPIRIVA  RESPIMAT) 2.5 MCG/ACT AERS INHALE 2 PUFFS BY MOUTH INTO THE LUNGS DAILY 12 g 0   Vitamins-Lipotropics (LIPOFLAVONOID) TABS Take 1 tablet by mouth in the morning, at noon, and at bedtime.     hydrALAZINE  (APRESOLINE ) 50 MG tablet TAKE 1 TABLET BY MOUTH THREE TIMES A DAY 270 tablet 1   levothyroxine  (SYNTHROID ) 25 MCG  tablet TAKE 1 TABLET BY MOUTH EVERY DAY BEFORE BREAKFAST 90 tablet 0   No facility-administered medications prior to visit.    ROS Review of Systems  Constitutional:  Positive for fatigue. Negative for appetite change, chills, diaphoresis, fever and unexpected weight change.  HENT:  Positive for tinnitus. Negative for ear pain and hearing loss.   Respiratory:  Positive for shortness of breath. Negative for apnea, cough, wheezing and stridor.   Cardiovascular:  Negative for chest pain, palpitations and leg swelling.  Gastrointestinal:  Negative for abdominal pain, constipation, diarrhea, nausea and vomiting.   Genitourinary: Negative.   Musculoskeletal: Negative.   Skin: Negative.   Neurological: Negative.  Negative for dizziness and weakness.  Hematological:  Negative for adenopathy. Does not bruise/bleed easily.  Psychiatric/Behavioral: Negative.      Objective:  BP 132/60 (BP Location: Left Arm, Patient Position: Sitting, Cuff Size: Small)   Pulse 74   Temp 97.9 F (36.6 C) (Oral)   Ht 5' 8 (1.727 m)   Wt 132 lb (59.9 kg)   SpO2 97%   BMI 20.07 kg/m   BP Readings from Last 3 Encounters:  09/22/23 132/60  07/15/23 132/68  02/03/23 134/66    Wt Readings from Last 3 Encounters:  09/22/23 132 lb (59.9 kg)  09/22/23 126 lb (57.2 kg)  07/15/23 133 lb 3.2 oz (60.4 kg)    Physical Exam Vitals reviewed.  Constitutional:      Appearance: Normal appearance.  HENT:     Right Ear: Hearing, tympanic membrane, ear canal and external ear normal.     Left Ear: Hearing normal. There is impacted cerumen.     Mouth/Throat:     Mouth: Mucous membranes are moist.   Eyes:     General: No scleral icterus.    Conjunctiva/sclera: Conjunctivae normal.    Cardiovascular:     Rate and Rhythm: Normal rate and regular rhythm.     Heart sounds: No murmur heard.    No friction rub. No gallop.  Pulmonary:     Effort: Pulmonary effort is normal.     Breath sounds: No stridor. No wheezing, rhonchi or rales.  Abdominal:     General: Abdomen is flat.     Palpations: There is no mass.     Tenderness: There is no abdominal tenderness. There is no guarding.     Hernia: No hernia is present.   Musculoskeletal:        General: Normal range of motion.     Cervical back: Neck supple.     Right lower leg: No edema.     Left lower leg: No edema.  Lymphadenopathy:     Cervical: No cervical adenopathy.   Skin:    General: Skin is warm and dry.   Neurological:     General: No focal deficit present.     Mental Status: He is alert.   Psychiatric:        Mood and Affect: Mood normal.         Behavior: Behavior normal.     Lab Results  Component Value Date   WBC 5.6 09/22/2023   HGB 14.8 09/22/2023   HCT 43.8 09/22/2023   PLT 180.0 09/22/2023   GLUCOSE 100 (H) 09/22/2023   CHOL 98 02/03/2023   TRIG 99.0 02/03/2023   HDL 40.60 02/03/2023   LDLCALC 38 02/03/2023   ALT 17 02/03/2023   AST 18 02/03/2023   NA 140 09/22/2023   K 4.0 09/22/2023   CL 105 09/22/2023  CREATININE 0.82 09/22/2023   BUN 17 09/22/2023   CO2 28 09/22/2023   TSH 3.83 09/22/2023   PSA 0.10 09/22/2023   INR 1.0 04/15/2011   HGBA1C 5.5 04/18/2020    MR Cervical Spine w/o contrast Result Date: 07/22/2022 CLINICAL DATA:  73 year old male with left side neck pain radiating to the shoulder. Stiffness, decreased range of motion since November. Prior surgery. EXAM: MRI CERVICAL SPINE WITHOUT CONTRAST TECHNIQUE: Multiplanar, multisequence MR imaging of the cervical spine was performed. No intravenous contrast was administered. COMPARISON:  Cervical spine radiographs 03/11/2022. FINDINGS: Alignment: Stable since December, mild straightening of lordosis. Subtle anterolisthesis of C7 on T1. No other significant spondylolisthesis. Vertebrae: Normal background bone marrow signal. Mild hardware susceptibility artifact from C5-C6 and C6-C7 ACDF. Evidence of solid arthrodesis at those levels. Moderate patchy marrow edema affecting the chronically degenerated left C3-C4 facets (series 5, images 14 and 15). Normal background bone marrow signal. Superimposed right side facet ankylosis at C2-C3. No other marrow edema or acute osseous abnormality. In the cervical spine. However, there is evidence of mild degenerative posterior element marrow edema in the right upper thoracic spine T1 and T2 (facets/pars series 5, image 4). Cord: Degenerative spinal cord mass effect is mild at C3-C4 and C4-C5. No spinal cord edema or myelomalacia. Negative visible upper thoracic cord. Posterior Fossa, vertebral arteries, paraspinal tissues:  Cervicomedullary junction is within normal limits. Negative visible posterior fossa. Maintained major vascular flow voids in the neck with dominant appearing left vertebral artery. Negative visible neck soft tissues, lung apices. Disc levels: C2-C3:  Ankylosed right facet. No stenosis. C3-C4: Broad-based posterior disc bulge or protrusion with foraminal and posterior endplate spurring. Moderate ligament flavum (series 7, image 9) and mild to moderate facet hypertrophy which is greater on the left (marrow edema there described above). Mild spinal stenosis and spinal cord mass effect. Moderate to severe left and mild-to-moderate right C4 neural foraminal stenosis. C4-C5: Disc space loss with circumferential disc bulge. Broad-based posterior component. Similar endplate spurring. Moderate facet and ligament flavum hypertrophy (series 7, image 13). Mild spinal stenosis and spinal cord mass effect. Moderate to severe bilateral C5 foraminal stenosis. C5-C6:  Prior ACDF with solid arthrodesis.  No stenosis. C6-C7:  Prior ACDF with solid arthrodesis.  No stenosis. C7-T1: Subtle anterolisthesis. Mild facet and ligament flavum hypertrophy. No stenosis. T1-T2: Subtle anterolisthesis and disc bulging. Moderate facet hypertrophy with evidence of a left side foraminal 6 mm degenerative synovial cyst (series 5, image 12). Associated mild to moderate left T1 and mild right T1 neural foraminal stenosis. T2-T3: Negative, no stenosis. IMPRESSION: 1. Prior ACDF at C5-C6 and C6-C7 with solid arthrodesis and no adverse features. And superimposed right side facet ankylosis at C2-C3. 2. 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 Subsequent mild multifactorial spinal stenosis AND spinal cord mass effect at both levels. No cord signal abnormality. Moderate to severe bilateral C4 and C5  neural foraminal stenosis. 3. Lesser adjacent segment disease at C7-T1 with facet hypertrophy. And moderate facet arthropathy at the T1-T2 level including a left foraminal synovial cyst contributing to moderate to severe left T1 neural foraminal stenosis. Electronically Signed   By: VEAR Hurst M.D.   On: 07/22/2022 14:17    Assessment & Plan:  Acquired hypothyroidism- He is euthyroid. -     TSH; Future -     Levothyroxine  Sodium; TAKE 1 TABLET BY MOUTH  EVERY DAY BEFORE BREAKFAST  Dispense: 90 tablet; Refill: 1  Essential hypertension- BP is well controlled. -     CBC with Differential/Platelet; Future -     Basic metabolic panel with GFR; Future -     TSH; Future -     hydrALAZINE  HCl; Take 1 tablet (50 mg total) by mouth 3 (three) times daily.  Dispense: 270 tablet; Refill: 1  Laryngopharyngeal reflux (LPR) -     CBC with Differential/Platelet; Future  Panlobular emphysema (HCC)  Benign prostatic hyperplasia without lower urinary tract symptoms -     PSA; Future  Coronary artery disease involving native coronary artery of native heart without angina pectoris -     hydrALAZINE  HCl; Take 1 tablet (50 mg total) by mouth 3 (three) times daily.  Dispense: 270 tablet; Refill: 1     Follow-up: Return in about 6 months (around 03/23/2024).  Debby Molt, MD

## 2023-09-23 ENCOUNTER — Ambulatory Visit: Payer: Self-pay | Admitting: Internal Medicine

## 2023-10-03 DIAGNOSIS — R3121 Asymptomatic microscopic hematuria: Secondary | ICD-10-CM | POA: Diagnosis not present

## 2023-10-03 DIAGNOSIS — N401 Enlarged prostate with lower urinary tract symptoms: Secondary | ICD-10-CM | POA: Diagnosis not present

## 2023-10-03 DIAGNOSIS — R351 Nocturia: Secondary | ICD-10-CM | POA: Diagnosis not present

## 2023-10-03 DIAGNOSIS — N200 Calculus of kidney: Secondary | ICD-10-CM | POA: Diagnosis not present

## 2023-10-06 ENCOUNTER — Other Ambulatory Visit: Payer: Self-pay | Admitting: Cardiology

## 2023-10-06 ENCOUNTER — Other Ambulatory Visit: Payer: Self-pay | Admitting: Internal Medicine

## 2023-10-06 DIAGNOSIS — J301 Allergic rhinitis due to pollen: Secondary | ICD-10-CM

## 2023-10-06 DIAGNOSIS — I480 Paroxysmal atrial fibrillation: Secondary | ICD-10-CM

## 2023-10-06 NOTE — Telephone Encounter (Signed)
 Prescription refill request for Eliquis  received. Indication: Afib  Last office visit: 07/15/23 Ema)  Scr: 0.82 (09/22/23)  Age: 73 Weight: 59.9kg  Appropriate dose. Refill sent.

## 2023-10-07 ENCOUNTER — Ambulatory Visit: Payer: Medicare PPO

## 2023-10-12 DIAGNOSIS — K449 Diaphragmatic hernia without obstruction or gangrene: Secondary | ICD-10-CM | POA: Diagnosis not present

## 2023-10-12 DIAGNOSIS — N200 Calculus of kidney: Secondary | ICD-10-CM | POA: Diagnosis not present

## 2023-10-12 DIAGNOSIS — R3121 Asymptomatic microscopic hematuria: Secondary | ICD-10-CM | POA: Diagnosis not present

## 2023-10-12 DIAGNOSIS — K5730 Diverticulosis of large intestine without perforation or abscess without bleeding: Secondary | ICD-10-CM | POA: Diagnosis not present

## 2023-10-12 DIAGNOSIS — R3129 Other microscopic hematuria: Secondary | ICD-10-CM | POA: Diagnosis not present

## 2023-10-16 ENCOUNTER — Other Ambulatory Visit: Payer: Self-pay | Admitting: Internal Medicine

## 2023-10-16 DIAGNOSIS — K2100 Gastro-esophageal reflux disease with esophagitis, without bleeding: Secondary | ICD-10-CM

## 2023-10-16 DIAGNOSIS — F331 Major depressive disorder, recurrent, moderate: Secondary | ICD-10-CM

## 2023-10-16 DIAGNOSIS — K219 Gastro-esophageal reflux disease without esophagitis: Secondary | ICD-10-CM

## 2023-10-18 ENCOUNTER — Encounter: Payer: Self-pay | Admitting: Nurse Practitioner

## 2023-11-24 DIAGNOSIS — R3121 Asymptomatic microscopic hematuria: Secondary | ICD-10-CM | POA: Diagnosis not present

## 2023-11-24 DIAGNOSIS — N200 Calculus of kidney: Secondary | ICD-10-CM | POA: Diagnosis not present

## 2023-12-02 ENCOUNTER — Other Ambulatory Visit: Payer: Self-pay | Admitting: Internal Medicine

## 2023-12-02 DIAGNOSIS — A6000 Herpesviral infection of urogenital system, unspecified: Secondary | ICD-10-CM

## 2023-12-10 ENCOUNTER — Other Ambulatory Visit: Payer: Self-pay | Admitting: Internal Medicine

## 2023-12-10 DIAGNOSIS — N400 Enlarged prostate without lower urinary tract symptoms: Secondary | ICD-10-CM

## 2023-12-20 ENCOUNTER — Other Ambulatory Visit: Payer: Self-pay | Admitting: Internal Medicine

## 2023-12-20 DIAGNOSIS — J431 Panlobular emphysema: Secondary | ICD-10-CM

## 2023-12-21 ENCOUNTER — Encounter: Payer: Self-pay | Admitting: Gastroenterology

## 2023-12-22 NOTE — Telephone Encounter (Signed)
 Copied from CRM [id number]. Topic: Clinical - Prescription Issue >> Dec 22, 2023  9:30 AM Mia F wrote: Reason for CRM: Pt is following up on the refill request on the Tiotropium Bromide Monohydrate  (SPIRIVA  RESPIMAT) 2.5 MCG/ACT AERS. Pt was advised that med refills can take up to 3 business days.

## 2024-01-03 ENCOUNTER — Other Ambulatory Visit: Payer: Self-pay | Admitting: Cardiology

## 2024-01-03 DIAGNOSIS — E785 Hyperlipidemia, unspecified: Secondary | ICD-10-CM

## 2024-01-26 DIAGNOSIS — L814 Other melanin hyperpigmentation: Secondary | ICD-10-CM | POA: Diagnosis not present

## 2024-01-26 DIAGNOSIS — L570 Actinic keratosis: Secondary | ICD-10-CM | POA: Diagnosis not present

## 2024-01-26 DIAGNOSIS — L821 Other seborrheic keratosis: Secondary | ICD-10-CM | POA: Diagnosis not present

## 2024-01-26 DIAGNOSIS — Z85828 Personal history of other malignant neoplasm of skin: Secondary | ICD-10-CM | POA: Diagnosis not present

## 2024-01-26 DIAGNOSIS — D0461 Carcinoma in situ of skin of right upper limb, including shoulder: Secondary | ICD-10-CM | POA: Diagnosis not present

## 2024-01-30 ENCOUNTER — Encounter: Payer: Self-pay | Admitting: Radiology

## 2024-02-07 ENCOUNTER — Ambulatory Visit: Admitting: Gastroenterology

## 2024-02-07 ENCOUNTER — Encounter: Payer: Self-pay | Admitting: Gastroenterology

## 2024-02-07 ENCOUNTER — Telehealth: Payer: Self-pay

## 2024-02-07 VITALS — BP 138/62 | HR 76 | Ht 68.0 in | Wt 136.0 lb

## 2024-02-07 DIAGNOSIS — K2100 Gastro-esophageal reflux disease with esophagitis, without bleeding: Secondary | ICD-10-CM | POA: Diagnosis not present

## 2024-02-07 DIAGNOSIS — Z860100 Personal history of colon polyps, unspecified: Secondary | ICD-10-CM

## 2024-02-07 DIAGNOSIS — K3184 Gastroparesis: Secondary | ICD-10-CM | POA: Diagnosis not present

## 2024-02-07 DIAGNOSIS — K639 Disease of intestine, unspecified: Secondary | ICD-10-CM

## 2024-02-07 DIAGNOSIS — K5730 Diverticulosis of large intestine without perforation or abscess without bleeding: Secondary | ICD-10-CM | POA: Diagnosis not present

## 2024-02-07 DIAGNOSIS — R933 Abnormal findings on diagnostic imaging of other parts of digestive tract: Secondary | ICD-10-CM | POA: Diagnosis not present

## 2024-02-07 MED ORDER — NA SULFATE-K SULFATE-MG SULF 17.5-3.13-1.6 GM/177ML PO SOLN: 1.0000 | Freq: Once | ORAL | 0 refills | Status: AC

## 2024-02-07 NOTE — Progress Notes (Unsigned)
 YVONNE PETITE [id number] 1950/04/30   Chief Complaint:  Referring Provider: Joshua Debby LITTIE, MD Primary GI MD: Sampson (previous Dr. Aneita)  HPI: MCKALE HAFFEY is a 73 y.o. male with past medical history of atrial fibrillation s/p ablation 2017 and on Eliquis , depression, gastroparesis, GERD, kidney stones, HTN, HLD, hypothyroidism, emphysema who presents today for a complaint of *** .    Last seen in office 12/2015 for follow-up of GERD and mild gastroparesis, noted to be completely asymptomatic at that time.  Per prior notes, had colonoscopy done at [facility] in 2015 which showed only diverticulosis.  CT A/P done with [facility] 10/12/2023 (reports scanned to chart) showed a nonobstructive 5 mm left renal stone, sigmoid diverticulosis with a short segment wall thickening of the sigmoid colon possibly reflecting sequela of chronic diverticulitis/segmental colitis associated with diverticulosis but nonspecific.  Consider further evaluation with colonoscopy if not recently performed.  Also found to have a small hiatal hernia with mild symmetric distal esophageal wall thickening which may reflect esophagitis.      Discussed the use of AI scribe software for clinical note transcription with the patient, who gave verbal consent to proceed.  History of Present Illness CLAUDIS GIOVANELLI is a 73 year old male with gastroparesis and reflux who presents for evaluation of CT scan findings and consideration of a colonoscopy. He was referred for evaluation of CT scan findings.  Gastrointestinal symptoms and findings - Gastroparesis with lack of hunger for a decade and early satiety - No abdominal pain, gas, or sensation of food getting stuck, except for pills, which he attributes to postnasal drip - Weight loss from 138 pounds to 126 pounds over the last two years - No current symptoms of reflux - Regular bowel movements at least once daily - Occasional difficulty initiating bowel  movements - No constipation, diarrhea, hematochezia, or melena  Colonic and esophageal imaging findings - Recent CT scan performed for hematuria and nephrolithiasis history - Incidental findings of thickening in the sigmoid colon and lower esophagus - History of diverticulosis - No episodes of diverticulitis or related symptoms such as left lower abdominal pain  Colonoscopy history - Last colonoscopy in 2015 - Colon polyps removed during first colonoscopy in December 2004 - Subsequent colonoscopies have been clear  Medication use - Omeprazole  taken in the morning - Famotidine  taken in the evening  Cardiac symptoms and history - Atrial fibrillation managed with Eliquis  - History of ablation procedure - Occasional brief episodes of fluttering  Pulmonary symptoms and history - Emphysema, currently stable - Uses inhaler in the morning - Occasional dyspnea when going uphill      Previous GI Procedures/Imaging   GES 11/05/2015  IMPRESSION: 1. Slight delay in gastric emptying. No evidence gastric outlet obstruction. Findings could reflect a mild degree of gastroparesis.   EGD 10/13/2015 - LA Grade A reflux esophagitis. - Z-line variable, 39 cm from the incisors. Biopsied. - A large amount of food (residue) in the stomach. - Normal duodenal bulb and second portion of the duodenum.   Esophagram 09/01/2015  IMPRESSION: 1. Nonspecific esophageal motility disorder with intermittent failure to propagate the primary peristaltic wave, and occasional very mild tertiary contractions. 2. Otherwise, normal esophagram, as above.    Past Medical History:  Diagnosis Date   Age-related macular degeneration, dry, left eye    Alcohol dependence (HCC)    Arthritis    maybe a little bit in my left knee and right forefinger (05/30/2015)   Atrial fibrillation (  HCC)    ablation 05-2015 with no reoccurance as of 09-2015   Basal cell carcinoma    Depression    hx   Family history of adverse  reaction to anesthesia    father had head injuries and dementia; everytime he was put under less of his mentation came back   Gastroparesis    GERD (gastroesophageal reflux disease)    History of kidney stones    Hypercholesterolemia dx'd 02/2015   Hypertension    Hypothyroidism    Kidney stones    Migraine aura without headache    very rare now (05/30/2015)   Pneumonia ~ 2004   Squamous carcinoma    hand, chest    Past Surgical History:  Procedure Laterality Date   ANTERIOR CERVICAL DECOMP/DISCECTOMY FUSION  04/2009   ATRIAL FIBRILLATION ABLATION  05/30/2015   BACK SURGERY     BASAL CELL CARCINOMA EXCISION     chest or back or left arm   CARDIOVERSION  06/2002   CATARACT EXTRACTION W/ INTRAOCULAR LENS IMPLANT Left ~ 2012   COLONOSCOPY     COLONOSCOPY W/ POLYPECTOMY  02/2003   CYSTOSCOPY W/ STONE MANIPULATION  1980's   basketted it out; no stent   ELECTROPHYSIOLOGIC STUDY N/A 05/30/2015   Procedure: Atrial Fibrillation Ablation;  Surgeon: Will Gladis Norton, MD;  Location: [facility];  Service: Cardiovascular;  Laterality: N/A;   EXTRACORPOREAL SHOCK WAVE LITHOTRIPSY Left 04/12/2016   Procedure: LEFT EXTRACORPOREAL SHOCK WAVE LITHOTRIPSY (ESWL);  Surgeon: Norleen Seltzer, MD;  Location: [facility];  Service: Urology;  Laterality: Left;   INGUINAL HERNIA REPAIR Bilateral 2004   INGUINAL HERNIA REPAIR Bilateral 2004   MOHS SURGERY Right    temple   SQUAMOUS CELL CARCINOMA EXCISION     chest or back or left arm    Current Outpatient Medications  Medication Sig Dispense Refill   acyclovir  (ZOVIRAX ) 400 MG tablet TAKE 1 TABLET BY MOUTH EVERY DAY 90 tablet 1   atorvastatin  (LIPITOR ) 80 MG tablet TAKE 1 TABLET BY MOUTH EVERY DAY 90 tablet 1   cholecalciferol  (VITAMIN D ) 1000 UNITS tablet Take 1,000 Units by mouth daily.     cycloSPORINE  (RESTASIS ) 0.05 % ophthalmic emulsion Place 1 drop into both eyes 2 (two) times daily.     diltiazem  (DILT-XR) 240 MG 24 hr capsule TAKE 1  CAPSULE BY MOUTH ONCE DAILY 90 capsule 3   ELIQUIS  5 MG TABS tablet TAKE 1 TABLET BY MOUTH TWICE A DAY 180 tablet 1   famotidine  (PEPCID ) 40 MG tablet TAKE 1 TABLET BY MOUTH EVERYDAY AT BEDTIME 90 tablet 1   finasteride  (PROSCAR ) 5 MG tablet TAKE 1 TABLET (5 MG TOTAL) BY MOUTH DAILY. 90 tablet 1   Fluorouracil 5 % SOLN Apply topically as needed. Per his dermatologist     hydrALAZINE  (APRESOLINE ) 50 MG tablet Take 1 tablet (50 mg total) by mouth 3 (three) times daily. 270 tablet 1   levothyroxine  (SYNTHROID ) 25 MCG tablet TAKE 1 TABLET BY MOUTH EVERY DAY BEFORE BREAKFAST 90 tablet 1   mirtazapine  (REMERON ) 7.5 MG tablet TAKE 1 TABLET BY MOUTH AT BEDTIME. 90 tablet 1   Multiple Vitamins-Minerals (PRESERVISION AREDS PO) Take 1 tablet by mouth daily.     omeprazole  (PRILOSEC) 40 MG capsule TAKE 1 CAPSULE (40 MG TOTAL) BY MOUTH DAILY. 90 capsule 1   tamsulosin  (FLOMAX ) 0.4 MG CAPS capsule TAKE 1 CAPSULE BY MOUTH EVERY DAY 90 capsule 0   Tiotropium Bromide Monohydrate  (SPIRIVA  RESPIMAT) 2.5 MCG/ACT AERS  INHALE 2 PUFFS BY MOUTH INTO THE LUNGS DAILY 4 g 1   Vitamins-Lipotropics (LIPOFLAVONOID) TABS Take 1 tablet by mouth in the morning, at noon, and at bedtime.     montelukast  (SINGULAIR ) 10 MG tablet TAKE 1 TABLET BY MOUTH EVERYDAY AT BEDTIME (Patient not taking: Reported on 02/07/2024) 90 tablet 1   No current facility-administered medications for this visit.    Allergies as of 02/07/2024 - Review Complete 02/07/2024  Allergen Reaction Noted   Pneumococcal vaccines Swelling 04/16/2015   Tape Other (See Comments) 05/30/2015   Amlodipine  Swelling 08/20/2011    Family History  Problem Relation Age of Onset   Stroke Brother    Cancer Brother        basosquamous cell carcinoma-head   Heart failure Mother    Colon cancer Cousin 50   Heart disease Neg Hx    Hyperlipidemia Neg Hx    Hypertension Neg Hx    Diabetes Neg Hx     Social History   Tobacco Use   Smoking status: Former    Current  packs/day: 0.00    Average packs/day: 3.0 packs/day for 17.0 years (51.0 ttl pk-yrs)    Types: Cigarettes    Start date: 03/29/1966    Quit date: 03/30/1983    Years since quitting: 40.8    Passive exposure: Never   Smokeless tobacco: Never  Vaping Use   Vaping status: Never Used  Substance Use Topics   Alcohol use: No    Alcohol/week: 0.0 standard drinks of alcohol    Comment: 05/30/2015 recovering alcoholic; dry date 04/19/2011   Drug use: No    Types: Marijuana, LSD, Mescaline    Comment: 03/07/2015 stopped all drugs back in the 1980's     Review of Systems:    Constitutional: No weight loss, fever, chills, weakness or fatigue Eyes: No change in vision Ears, Nose, Throat:  No change in hearing or congestion Skin: No rash or itching Cardiovascular: No chest pain, chest pressure or palpitations   Respiratory: No SOB or cough Gastrointestinal: See HPI and otherwise negative Genitourinary: No dysuria or change in urinary frequency Neurological: No headache, dizziness or syncope Musculoskeletal: No new muscle or joint pain Hematologic: No bleeding or bruising    Physical Exam:  Vital signs: BP 138/62   Pulse 76   Ht 5' 8 (1.727 m)   Wt 136 lb (61.7 kg)   SpO2 97%   BMI 20.68 kg/m   Constitutional: NAD, Well developed, Well nourished, alert and cooperative Head:  Normocephalic and atraumatic.  Eyes: No scleral icterus. Conjunctiva pink. Mouth: No oral lesions. Respiratory: Respirations even and unlabored. Lungs clear to auscultation bilaterally.  No wheezes, crackles, or rhonchi.  Cardiovascular:  Regular rate and rhythm. No murmurs. No peripheral edema. Gastrointestinal:  Soft, nondistended, nontender. No rebound or guarding. Normal bowel sounds. No appreciable masses or hepatomegaly. Rectal:  Not performed.  Neurologic:  Alert and oriented x4;  grossly normal neurologically.  Skin:   Dry and intact without significant lesions or rashes. Psychiatric: Oriented to  person, place and time. Demonstrates good judgement and reason without abnormal affect or behaviors.   RELEVANT LABS AND IMAGING: CBC    Component Value Date/Time   WBC 5.6 09/22/2023 1034   RBC 4.71 09/22/2023 1034   HGB 14.8 09/22/2023 1034   HGB 14.3 10/27/2021 1059   HCT 43.8 09/22/2023 1034   HCT 41.3 10/27/2021 1059   PLT 180.0 09/22/2023 1034   PLT 208 10/27/2021 1059   MCV 92.9  09/22/2023 1034   MCV 92 10/27/2021 1059   MCH 32.0 10/27/2021 1059   MCH 30.2 05/19/2015 0859   MCHC 33.9 09/22/2023 1034   RDW 12.3 09/22/2023 1034   RDW 12.1 10/27/2021 1059   LYMPHSABS 1.8 09/22/2023 1034   MONOABS 0.5 09/22/2023 1034   EOSABS 0.0 09/22/2023 1034   BASOSABS 0.0 09/22/2023 1034    CMP     Component Value Date/Time   NA 140 09/22/2023 1034   NA 139 10/27/2021 1059   K 4.0 09/22/2023 1034   CL 105 09/22/2023 1034   CO2 28 09/22/2023 1034   GLUCOSE 100 (H) 09/22/2023 1034   BUN 17 09/22/2023 1034   BUN 12 10/27/2021 1059   CREATININE 0.82 09/22/2023 1034   CREATININE 0.89 05/19/2015 0859   CALCIUM  9.3 09/22/2023 1034   PROT 6.7 02/03/2023 1024   ALBUMIN 4.5 02/03/2023 1024   AST 18 02/03/2023 1024   ALT 17 02/03/2023 1024   ALKPHOS 84 02/03/2023 1024   BILITOT 0.8 02/03/2023 1024   GFRNONAA >60 03/07/2015 0603   GFRAA >60 03/07/2015 0603   Echocardiogram 03/01/2023 1. Left ventricular ejection fraction, by estimation, is 60 to 65% . Left ventricular ejection fraction by 3D volume is 62 % . The left ventricle has normal function. The left ventricle has no regional wall motion abnormalities. There is mild asymmetric left ventricular hypertrophy of the basal- septal segment. Left ventricular diastolic parameters were normal.  2. Right ventricular systolic function is normal. The right ventricular size is normal. There is normal pulmonary artery systolic pressure.  3. The mitral valve is normal in structure. Trivial mitral valve regurgitation. No evidence of mitral  stenosis.  4. The aortic valve is normal in structure. Aortic valve regurgitation is not visualized. No aortic stenosis is present.  5. The inferior vena cava is normal in size with greater than 50% respiratory variability, suggesting right atrial pressure of 3 mmHg.  Assessment/Plan:    Assessment and Plan Assessment & Plan Colonic wall thickening, sigmoid colon, and diverticulosis--evaluation with colonoscopy Incidental sigmoid colon thickening on CT, possible inflammation. Diverticulosis without diverticulitis. Colonoscopy needed for evaluation and biopsy. - Schedule colonoscopy with upper endoscopy. - Request cardiology permission to hold Eliquis  pre-procedure. - Order colonoscopy prep and provide dietary instructions.  Esophagitis and gastroesophageal reflux disease--evaluation with upper endoscopy Possible esophagitis on CT. No reflux symptoms. Upper endoscopy needed for evaluation. - Schedule upper endoscopy with colonoscopy. - Continue omeprazole  AM and famotidine  PM.  Gastroparesis--stable on current therapy Symptoms well-managed. No pain, gas, or dysphagia. Early satiety present. Weight loss noted. - Continue current management.  Atrial fibrillation, status post ablation, on anticoagulation--peri-procedural management Managed with Eliquis  post-ablation. Occasional fluttering. Peri-procedural anticoagulation management needed. - Request cardiology permission to hold Eliquis  pre-procedure.  Emphysema--stable on inhaler therapy Well-managed with inhaler. Occasional exertional dyspnea. - Continue current inhaler therapy.  Colonoscopy Add EGD based on findings of possible esophagitis Eliquis  hold  Not early in the morning, doesn't have to be late in the day       Camie Furbish, PA-C [facility] 02/07/2024, 10:41 AM  Patient Care Team: Joshua Debby CROME, MD as PCP - General (Internal Medicine) Inocencio Soyla Lunger, MD as PCP - Electrophysiology  (Cardiology) Lavonia Lye, MD as Consulting Physician (Ophthalmology)

## 2024-02-07 NOTE — Telephone Encounter (Signed)
 [facility] Pre-operative Risk Assessment     Request for surgical clearance:     Endoscopy Procedure  What type of surgery is being performed?     EGD/Colon   When is this surgery scheduled?     03-02-24  What type of clearance is required ?   Pharmacy  Are there any medications that need to be held prior to surgery and how long? Eliquis  2 days prior  Practice name and name of physician performing surgery?      [facility]  What is your office phone and fax number?      Phone- [phone]  Fax- [phone]  Anesthesia type (None, local, MAC, general) ?       MAC   Please route your response to [employer], [facility])

## 2024-02-07 NOTE — Patient Instructions (Addendum)
 _______________________________________________________  If your blood pressure at your visit was 140/90 or greater, please contact your primary care physician to follow up on this.  _______________________________________________________  If you are age 75 or older, your body mass index should be between 23-30. Your Body mass index is 20.68 kg/m. If this is out of the aforementioned range listed, please consider follow up with your Primary Care Provider.  If you are age 74 or younger, your body mass index should be between 19-25. Your Body mass index is 20.68 kg/m. If this is out of the aformentioned range listed, please consider follow up with your Primary Care Provider.   ________________________________________________________  The [facility] providers would like to encourage you to use MYCHART to communicate with providers for non-urgent requests or questions.  Due to long hold times on the telephone, sending your provider a message by [facility] may be a faster and more efficient way to get a response.  Please allow 48 business hours for a response.  Please remember that this is for non-urgent requests.  _______________________________________________________  [facility] is using a team-based approach to care.  Your team is made up of your doctor and two to three APPS. Our APPS (Nurse Practitioners and Physician Assistants) work with your physician to ensure care continuity for you. They are fully qualified to address your health concerns and develop a treatment plan. They communicate directly with your gastroenterologist to care for you. Seeing the Advanced Practice Practitioners on your physician's team can help you by facilitating care more promptly, often allowing for earlier appointments, access to diagnostic testing, procedures, and other specialty referrals.   We have sent the following medications to your pharmacy for you to pick up at your convenience: Suprep  You will  be contacted by our office prior to your procedure for directions on holding your Plavix.  If you do not hear from our office 2 week prior to your scheduled procedure, please call [phone] to discuss.   You have been scheduled for an endoscopy and colonoscopy. Please follow the written instructions given to you at your visit today.  If you use inhalers (even only as needed), please bring them with you on the day of your procedure.  DO NOT TAKE 7 DAYS PRIOR TO TEST- Trulicity (dulaglutide) Ozempic, Wegovy (semaglutide) Mounjaro, Zepbound (tirzepatide) Bydureon Bcise (exanatide extended release)  DO NOT TAKE 1 DAY PRIOR TO YOUR TEST Rybelsus (semaglutide) Adlyxin (lixisenatide) Victoza (liraglutide) Byetta (exanatide) ___________________________________________________________________________   It was a pleasure to see you today!  Thank you for trusting me with your gastrointestinal care!

## 2024-02-07 NOTE — Telephone Encounter (Signed)
 Pharmacy please advise on holding eliquis  prior to egd/colonoscopy scheduled for 03/02/2024. Thank you.

## 2024-02-09 ENCOUNTER — Encounter: Payer: Self-pay | Admitting: Gastroenterology

## 2024-02-09 NOTE — Telephone Encounter (Signed)
 Patient with diagnosis of atrial fibrillation on Eliquis  for anticoagulation.    What type of surgery is being performed?     EGD/Colon   When is this surgery scheduled?     03-02-24    CHA2DS2-VASc Score = 4   This indicates a 4.8% annual risk of stroke. The patient's score is based upon: CHF History: 0 HTN History: 1 Diabetes History: 0 Stroke History: 0 Vascular Disease History: 1 Age Score: 2 Gender Score: 0    CrCl 68 Platelet count 180  Patient has not had an Afib/aflutter ablation in the last 3 months, DCCV within the last 4 weeks or a watchman implanted in the last 45 days   Per office protocol, patient can hold Eliquis  for 2 days prior to procedure.   Patient will not need bridging with Lovenox (enoxaparin) around procedure.  **This guidance is not considered finalized until pre-operative APP has relayed final recommendations.**

## 2024-02-10 NOTE — Telephone Encounter (Signed)
   Patient Name: Roy Long  DOB: [birth year] MRN: [id number]  Primary Cardiologist: None  Chart reviewed as part of pre-operative protocol coverage. Patient has an upcoming colonoscopy and EGD scheduled for 03/02/2024 and we were asked to give our recommendations for holding Eliquis .   Per office Pharmacy and office protocol: Patient can hold Eliquis  for 2 days prior to procedure.  Patient will not need bridging with Lovenox (enoxaparin) around procedure.  I will route this recommendation to the requesting party via Epic fax function and remove from pre-op pool.  Please call with questions.  Kamila Broda [name], PA-C 02/10/2024, 7:19 AM

## 2024-02-10 NOTE — Telephone Encounter (Signed)
 Patient made aware and voiced understanding.

## 2024-02-19 ENCOUNTER — Other Ambulatory Visit: Payer: Self-pay | Admitting: Internal Medicine

## 2024-02-19 DIAGNOSIS — J431 Panlobular emphysema: Secondary | ICD-10-CM

## 2024-02-19 NOTE — Progress Notes (Signed)
 Agree with the assessment and plan as outlined by Valiant Gaul, PA-C.  Mollee Neer, DO, [facility]

## 2024-02-22 ENCOUNTER — Encounter: Payer: Self-pay | Admitting: Gastroenterology

## 2024-03-01 ENCOUNTER — Telehealth: Payer: Self-pay | Admitting: Gastroenterology

## 2024-03-01 NOTE — Telephone Encounter (Signed)
 Patient made aware that he will be taking medications by his time limit

## 2024-03-01 NOTE — Telephone Encounter (Signed)
 Inbound call from patient stating he would like to speak to someone in regards to his medication and what medications can he take before procedure tomorrow 03/02/24 Requesting a call back  Please advise  Thank you

## 2024-03-02 ENCOUNTER — Ambulatory Visit: Admitting: Gastroenterology

## 2024-03-02 ENCOUNTER — Encounter: Payer: Self-pay | Admitting: Gastroenterology

## 2024-03-02 VITALS — BP 140/73 | HR 73 | Temp 97.6°F | Resp 9 | Ht 68.0 in | Wt 136.0 lb

## 2024-03-02 DIAGNOSIS — K5520 Angiodysplasia of colon without hemorrhage: Secondary | ICD-10-CM

## 2024-03-02 DIAGNOSIS — D125 Benign neoplasm of sigmoid colon: Secondary | ICD-10-CM

## 2024-03-02 DIAGNOSIS — K2970 Gastritis, unspecified, without bleeding: Secondary | ICD-10-CM | POA: Diagnosis not present

## 2024-03-02 DIAGNOSIS — K635 Polyp of colon: Secondary | ICD-10-CM

## 2024-03-02 DIAGNOSIS — R933 Abnormal findings on diagnostic imaging of other parts of digestive tract: Secondary | ICD-10-CM | POA: Diagnosis not present

## 2024-03-02 DIAGNOSIS — K2100 Gastro-esophageal reflux disease with esophagitis, without bleeding: Secondary | ICD-10-CM

## 2024-03-02 DIAGNOSIS — K3189 Other diseases of stomach and duodenum: Secondary | ICD-10-CM

## 2024-03-02 DIAGNOSIS — K2950 Unspecified chronic gastritis without bleeding: Secondary | ICD-10-CM | POA: Diagnosis not present

## 2024-03-02 DIAGNOSIS — D122 Benign neoplasm of ascending colon: Secondary | ICD-10-CM

## 2024-03-02 DIAGNOSIS — I4891 Unspecified atrial fibrillation: Secondary | ICD-10-CM | POA: Diagnosis not present

## 2024-03-02 DIAGNOSIS — D124 Benign neoplasm of descending colon: Secondary | ICD-10-CM | POA: Diagnosis not present

## 2024-03-02 DIAGNOSIS — K5730 Diverticulosis of large intestine without perforation or abscess without bleeding: Secondary | ICD-10-CM | POA: Diagnosis not present

## 2024-03-02 DIAGNOSIS — K319 Disease of stomach and duodenum, unspecified: Secondary | ICD-10-CM

## 2024-03-02 DIAGNOSIS — E039 Hypothyroidism, unspecified: Secondary | ICD-10-CM | POA: Diagnosis not present

## 2024-03-02 MED ORDER — SODIUM CHLORIDE 0.9 % IV SOLN: 500.0000 mL | Freq: Once | INTRAVENOUS | Status: DC

## 2024-03-02 NOTE — Patient Instructions (Addendum)
 Handout provided on polyps.  Pathology results will be sent by MyChart or letter along with additional recommendations.    YOU HAD AN ENDOSCOPIC PROCEDURE TODAY AT THE [facility]:   Refer to the procedure report that was given to you for any specific questions about what was found during the examination.  If the procedure report does not answer your questions, please call your gastroenterologist to clarify.  If you requested that your care partner not be given the details of your procedure findings, then the procedure report has been included in a sealed envelope for you to review at your convenience later.  YOU SHOULD EXPECT: Some feelings of bloating in the abdomen. Passage of more gas than usual.  Walking can help get rid of the air that was put into your GI tract during the procedure and reduce the bloating. If you had a lower endoscopy (such as a colonoscopy or flexible sigmoidoscopy) you may notice spotting of blood in your stool or on the toilet paper. If you underwent a bowel prep for your procedure, you may not have a normal bowel movement for a few days.  Please Note:  You might notice some irritation and congestion in your nose or some drainage.  This is from the oxygen used during your procedure.  There is no need for concern and it should clear up in a day or so.  SYMPTOMS TO REPORT IMMEDIATELY:  Following lower endoscopy (colonoscopy or flexible sigmoidoscopy):  Excessive amounts of blood in the stool  Significant tenderness or worsening of abdominal pains  Swelling of the abdomen that is new, acute  Fever of 100F or higher  Following upper endoscopy (EGD)  Vomiting of blood or coffee ground material  New chest pain or pain under the shoulder blades  Painful or persistently difficult swallowing  New shortness of breath  Fever of 100F or higher  Black, tarry-looking stools  For urgent or emergent issues, a gastroenterologist can be reached at any hour by calling  [phone]. Do not use MyChart messaging for urgent concerns.    DIET:  We do recommend a small meal at first, but then you may proceed to your regular diet.  Drink plenty of fluids but you should avoid alcoholic beverages for 24 hours.  ACTIVITY:  You should plan to take it easy for the rest of today and you should NOT DRIVE or use heavy machinery until tomorrow (because of the sedation medicines used during the test).    FOLLOW UP: Our staff will call the number listed on your records the next business day following your procedure.  We will call around 7:15- 8:00 am to check on you and address any questions or concerns that you may have regarding the information given to you following your procedure. If we do not reach you, we will leave a message.     If any biopsies were taken you will be contacted by phone or by letter within the next 1-3 weeks.  Please call us  at [phone] if you have not heard about the biopsies in 3 weeks.    SIGNATURES/CONFIDENTIALITY: You and/or your care partner have signed paperwork which will be entered into your electronic medical record.  These signatures attest to the fact that that the information above on your After Visit Summary has been reviewed and is understood.  Full responsibility of the confidentiality of this discharge information lies with you and/or your care-partner.

## 2024-03-02 NOTE — Progress Notes (Signed)
 VS by CL  Pt's states no medical or surgical changes since previsit or office visit.

## 2024-03-02 NOTE — Op Note (Signed)
 [facility] Patient Name: Roy Long Procedure Date: 03/02/2024 11:02 AM MRN: [id number] Endoscopist: Sandor Flatter , MD, [id number] Age: 73 Referring MD:  Date of Birth: 1951/01/19 Gender: Male Account #: [id number] Procedure:                Upper GI endoscopy Indications:              Esophageal reflux, Abnormal CT of the GI tract Medicines:                Monitored Anesthesia Care Procedure:                Pre-Anesthesia Assessment:                           - Prior to the procedure, a History and Physical                            was performed, and patient medications and                            allergies were reviewed. The patient's tolerance of                            previous anesthesia was also reviewed. The risks                            and benefits of the procedure and the sedation                            options and risks were discussed with the patient.                            All questions were answered, and informed consent                            was obtained. Prior Anticoagulants: The patient has                            taken no anticoagulant or antiplatelet agents. ASA                            Grade Assessment: III - A patient with severe                            systemic disease. After reviewing the risks and                            benefits, the patient was deemed in satisfactory                            condition to undergo the procedure.                           After obtaining informed consent, the endoscope was  passed under direct vision. Throughout the                            procedure, the patient's blood pressure, pulse, and                            oxygen saturations were monitored continuously. The                            Olympus Scope [id number] was introduced through the                            mouth, and advanced to the second part of duodenum.                             The upper GI endoscopy was accomplished without                            difficulty. The patient tolerated the procedure                            well. Scope In: Scope Out: Findings:                 The examined esophagus was normal.                           The Z-line was regular and was found 40 cm from the                            incisors.                           Diffuse mild inflammation characterized by                            congestion (edema) and erythema was found in the                            entire examined stomach. Biopsies were taken with a                            cold forceps for Helicobacter pylori testing (jar                            1). Estimated blood loss was minimal.                           Localized mild mucosal changes characterized by                            black speckled pattern mucosa were found in the                            duodenal bulb. Biopsies were taken with a cold  forceps for histology (jar 3). Estimated blood loss                            was minimal.                           The second portion of the duodenum was normal.                            Biopsies were taken with a cold forceps for                            histology (jar 2). Estimated blood loss was minimal. Complications:            No immediate complications. Estimated Blood Loss:     Estimated blood loss was minimal. Impression:               - Normal esophagus.                           - Z-line regular, 40 cm from the incisors.                           - Gastritis. Biopsied.                           - Mucosal changes in the duodenal bulb consistent                            with benign Pseudomelanosis duodeni. Biopsied.                           - Normal second portion of the duodenum. Biopsied. Recommendation:           - Patient has a contact number available for                            emergencies. The signs and  symptoms of potential                            delayed complications were discussed with the                            patient. Return to normal activities tomorrow.                            Written discharge instructions were provided to the                            patient.                           - Resume previous diet.                           - Continue present medications.                           -  Await pathology results.                           - Perform a colonoscopy today. Sandor Flatter, MD 03/02/2024 11:54:16 AM

## 2024-03-02 NOTE — Op Note (Signed)
 [facility] Patient Name: Roy Long Procedure Date: 03/02/2024 11:01 AM MRN: [id number] Endoscopist: Sandor Flatter , MD, [id number] Age: 73 Referring MD:  Date of Birth: March 28, 1951 Gender: Male Account #: [id number] Procedure:                Colonoscopy Indications:              Abnormal CT of the GI tract                           History of colon polyps                           CT A/P done with [facility] 10/12/2023 showed                            a nonobstructive 5 mm left renal stone, sigmoid                            diverticulosis with a short segment wall thickening                            of the sigmoid colon possibly reflecting sequela of                            chronic diverticulitis/segmental colitis associated                            with diverticulosis but nonspecific. Medicines:                Monitored Anesthesia Care Procedure:                Pre-Anesthesia Assessment:                           - Prior to the procedure, a History and Physical                            was performed, and patient medications and                            allergies were reviewed. The patient's tolerance of                            previous anesthesia was also reviewed. The risks                            and benefits of the procedure and the sedation                            options and risks were discussed with the patient.                            All questions were answered, and informed consent  was obtained. Prior Anticoagulants: The patient has                            taken Eliquis  (apixaban ), last dose was 2 days                            prior to procedure. ASA Grade Assessment: III - A                            patient with severe systemic disease. After                            reviewing the risks and benefits, the patient was                            deemed in satisfactory condition to undergo the                             procedure.                           After obtaining informed consent, the colonoscope                            was passed under direct vision. Throughout the                            procedure, the patient's blood pressure, pulse, and                            oxygen saturations were monitored continuously. The                            [id number]-[id number] Colonoscope [id number] was introduced                            through the anus and advanced to the the terminal                            ileum. The colonoscopy was performed without                            difficulty. The patient tolerated the procedure                            well. The quality of the bowel preparation was                            good. The terminal ileum, ileocecal valve,                            appendiceal orifice, and rectum were photographed. Scope In: 11:25:13 AM Scope Out: 11:44:52 AM Scope Withdrawal Time: 0 hours 15 minutes 13 seconds  Total Procedure Duration: 0 hours  19 minutes 39 seconds  Findings:                 The perianal and digital rectal examinations were                            normal.                           A 2 mm polyp was found in the ascending colon. The                            polyp was sessile. The polyp was removed with a                            cold snare. Resection and retrieval were complete.                            Estimated blood loss was minimal.                           Two sessile polyps were found in the sigmoid colon                            and descending colon. The polyps were 3 to 6 mm in                            size. These polyps were removed with a cold snare.                            Resection and retrieval were complete. Estimated                            blood loss was minimal.                           Multiple small-mouthed diverticula were found in                            the sigmoid colon. The surrounding  mucosa was                            otherwise normal appearing.                           The retroflexed view of the distal rectum and anal                            verge was normal and showed no anal or rectal                            abnormalities.                           The terminal ileum appeared normal.  A single small angioectasia without bleeding was                            found in the cecum. Complications:            No immediate complications. Estimated Blood Loss:     Estimated blood loss was minimal. Impression:               - One 2 mm polyp in the ascending colon, removed                            with a cold snare. Resected and retrieved.                           - Two 3 to 6 mm polyps in the sigmoid colon and in                            the descending colon, removed with a cold snare.                            Resected and retrieved.                           - Diverticulosis in the sigmoid colon.                           - The distal rectum and anal verge are normal on                            retroflexion view.                           - The examined portion of the ileum was normal.                           - A single non-bleeding colonic angioectasia. Recommendation:           - Patient has a contact number available for                            emergencies. The signs and symptoms of potential                            delayed complications were discussed with the                            patient. Return to normal activities tomorrow.                            Written discharge instructions were provided to the                            patient.                           -  Resume previous diet.                           - Await pathology results.                           - Repeat colonoscopy for surveillance based on                            pathology results.                           - Return to GI office  PRN.                           - Resume Eliquis  (apixaban ) at prior dose tomorrow. Sandor Flatter, MD 03/02/2024 12:00:21 PM

## 2024-03-02 NOTE — Progress Notes (Signed)
 Called to room to assist during endoscopic procedure.  Patient ID and intended procedure confirmed with present staff. Received instructions for my participation in the procedure from the performing physician.

## 2024-03-02 NOTE — Progress Notes (Signed)
 Sedate, gd SR, tolerated procedure well, VSS, report to RN

## 2024-03-02 NOTE — Progress Notes (Signed)
 GASTROENTEROLOGY PROCEDURE H&P NOTE   Primary Care Physician: Joshua Debby CROME, MD    Reason for Procedure:   GERD w/ esophagitis hx, colon cancer screening, abnormal CT findings  Plan:    EGD, colonoscopy  Patient is appropriate for endoscopic procedure(s) in the ambulatory ([facility]) setting.  The nature of the procedure, as well as the risks, benefits, and alternatives were carefully and thoroughly reviewed with the patient. Ample time for discussion and questions allowed. The patient understood, was satisfied, and agreed to proceed. I personally addressed all patient questions and concerns.     HPI: Roy Long is a 73 y.o. male who presents for EGD for evaluation of GERD, BE screening, gastroparesis, wt loss, along with colonoscopy for CRC screening.  Additionally, EGD and colonoscopy to evaluate non-specific abnormalities on recent CT as below.   Patient was most recently seen in the Gastroenterology Clinic on 02/07/2024.  No interval change in medical history since that appointment. Please refer to that note for full details regarding GI history and clinical presentation.   Colon polyps removed during first colonoscopy in December 2004. Last colonoscopy done at [facility] in 2015 which showed only diverticulosis.   CT A/P done with [facility] 10/12/2023 (reports scanned to chart) showed a nonobstructive 5 mm left renal stone, sigmoid diverticulosis with a short segment wall thickening of the sigmoid colon possibly reflecting sequela of chronic diverticulitis/segmental colitis associated with diverticulosis but nonspecific.  Consider further evaluation with colonoscopy if not recently performed.  Also found to have a small hiatal hernia with mild symmetric distal esophageal wall thickening which may reflect esophagitis.   GES 11/05/2015  IMPRESSION: 1. Slight delay in gastric emptying. No evidence gastric outlet obstruction. Findings could reflect a mild degree of  gastroparesis.   EGD 10/13/2015 - LA Grade A reflux esophagitis. - Z-line variable, 39 cm from the incisors. Biopsied. - A large amount of food (residue) in the stomach. - Normal duodenal bulb and second portion of the duodenum.   Esophagram 09/01/2015  IMPRESSION: 1. Nonspecific esophageal motility disorder with intermittent failure to propagate the primary peristaltic wave, and occasional very mild tertiary contractions. 2. Otherwise, normal esophagram, as above.  Holding Eliquis  for procedures today.   Past Medical History:  Diagnosis Date   Age-related macular degeneration, dry, left eye    Alcohol dependence (HCC)    Arthritis    maybe a little bit in my left knee and right forefinger (05/30/2015)   Atrial fibrillation (HCC)    ablation 05-2015 with no reoccurance as of 09-2015   Basal cell carcinoma    Depression    hx   Family history of adverse reaction to anesthesia    father had head injuries and dementia; everytime he was put under less of his mentation came back   Gastroparesis    GERD (gastroesophageal reflux disease)    History of kidney stones    Hypercholesterolemia dx'd 02/2015   Hypertension    Hypothyroidism    Kidney stones    Migraine aura without headache    very rare now (05/30/2015)   Pneumonia ~ 2004   Squamous carcinoma    hand, chest    Past Surgical History:  Procedure Laterality Date   ANTERIOR CERVICAL DECOMP/DISCECTOMY FUSION  04/2009   ATRIAL FIBRILLATION ABLATION  05/30/2015   BACK SURGERY     BASAL CELL CARCINOMA EXCISION     chest or back or left arm   CARDIOVERSION  06/2002   CATARACT EXTRACTION W/  INTRAOCULAR LENS IMPLANT Left ~ 2012   COLONOSCOPY     COLONOSCOPY W/ POLYPECTOMY  02/2003   CYSTOSCOPY W/ STONE MANIPULATION  1980's   basketted it out; no stent   ELECTROPHYSIOLOGIC STUDY N/A 05/30/2015   Procedure: Atrial Fibrillation Ablation;  Surgeon: Will Gladis Norton, MD;  Location: [facility];  Service: Cardiovascular;   Laterality: N/A;   EXTRACORPOREAL SHOCK WAVE LITHOTRIPSY Left 04/12/2016   Procedure: LEFT EXTRACORPOREAL SHOCK WAVE LITHOTRIPSY (ESWL);  Surgeon: Norleen Seltzer, MD;  Location: [facility];  Service: Urology;  Laterality: Left;   INGUINAL HERNIA REPAIR Bilateral 2004   INGUINAL HERNIA REPAIR Bilateral 2004   MOHS SURGERY Right    temple   SQUAMOUS CELL CARCINOMA EXCISION     chest or back or left arm    Prior to Admission medications   Medication Sig Start Date End Date Taking? Authorizing Provider  acyclovir  (ZOVIRAX ) 400 MG tablet TAKE 1 TABLET BY MOUTH EVERY DAY 12/02/23  Yes Joshua Debby CROME, MD  atorvastatin  (LIPITOR ) 80 MG tablet TAKE 1 TABLET BY MOUTH EVERY DAY 01/04/24  Yes Lesia Ozell Barter, PA-C  cholecalciferol  (VITAMIN D ) 1000 UNITS tablet Take 1,000 Units by mouth daily.   Yes [provider]  cycloSPORINE  (RESTASIS ) 0.05 % ophthalmic emulsion Place 1 drop into both eyes 2 (two) times daily.   Yes [provider]  diltiazem  (DILT-XR) 240 MG 24 hr capsule TAKE 1 CAPSULE BY MOUTH ONCE DAILY 09/02/23  Yes Camnitz, Will Gladis, MD  famotidine  (PEPCID ) 40 MG tablet TAKE 1 TABLET BY MOUTH EVERYDAY AT BEDTIME 10/18/23  Yes Joshua Debby CROME, MD  finasteride  (PROSCAR ) 5 MG tablet TAKE 1 TABLET (5 MG TOTAL) BY MOUTH DAILY. 10/18/23  Yes Joshua Debby CROME, MD  hydrALAZINE  (APRESOLINE ) 50 MG tablet Take 1 tablet (50 mg total) by mouth 3 (three) times daily. 09/22/23  Yes Joshua Debby CROME, MD  levothyroxine  (SYNTHROID ) 25 MCG tablet TAKE 1 TABLET BY MOUTH EVERY DAY BEFORE BREAKFAST 09/22/23  Yes Joshua Debby CROME, MD  mirtazapine  (REMERON ) 7.5 MG tablet TAKE 1 TABLET BY MOUTH AT BEDTIME. 10/18/23  Yes Joshua Debby CROME, MD  Multiple Vitamins-Minerals (PRESERVISION AREDS PO) Take 1 tablet by mouth daily.   Yes [provider]  omeprazole  (PRILOSEC) 40 MG capsule TAKE 1 CAPSULE (40 MG TOTAL) BY MOUTH DAILY. 09/13/23  Yes Joshua Debby CROME, MD  tamsulosin  (FLOMAX ) 0.4 MG CAPS capsule TAKE 1  CAPSULE BY MOUTH EVERY DAY 12/16/23  Yes Joshua Debby CROME, MD  Tiotropium Bromide (SPIRIVA  RESPIMAT) 2.5 MCG/ACT AERS INHALE 2 PUFFS BY MOUTH INTO THE LUNGS DAILY 02/22/24  Yes Joshua Debby CROME, MD  Vitamins-Lipotropics (LIPOFLAVONOID) TABS Take 1 tablet by mouth in the morning, at noon, and at bedtime.   Yes [provider]  ELIQUIS  5 MG TABS tablet TAKE 1 TABLET BY MOUTH TWICE A DAY 10/06/23   Camnitz, Soyla Gladis, MD  Fluorouracil 5 % SOLN Apply topically as needed. Per his dermatologist    [provider]  montelukast  (SINGULAIR ) 10 MG tablet TAKE 1 TABLET BY MOUTH EVERYDAY AT BEDTIME Patient not taking: No sig reported 10/11/23   Joshua Debby CROME, MD    Current Outpatient Medications  Medication Sig Dispense Refill   acyclovir  (ZOVIRAX ) 400 MG tablet TAKE 1 TABLET BY MOUTH EVERY DAY 90 tablet 1   atorvastatin  (LIPITOR ) 80 MG tablet TAKE 1 TABLET BY MOUTH EVERY DAY 90 tablet 1   cholecalciferol  (VITAMIN D ) 1000 UNITS tablet Take 1,000 Units by mouth  daily.     cycloSPORINE  (RESTASIS ) 0.05 % ophthalmic emulsion Place 1 drop into both eyes 2 (two) times daily.     diltiazem  (DILT-XR) 240 MG 24 hr capsule TAKE 1 CAPSULE BY MOUTH ONCE DAILY 90 capsule 3   famotidine  (PEPCID ) 40 MG tablet TAKE 1 TABLET BY MOUTH EVERYDAY AT BEDTIME 90 tablet 1   finasteride  (PROSCAR ) 5 MG tablet TAKE 1 TABLET (5 MG TOTAL) BY MOUTH DAILY. 90 tablet 1   hydrALAZINE  (APRESOLINE ) 50 MG tablet Take 1 tablet (50 mg total) by mouth 3 (three) times daily. 270 tablet 1   levothyroxine  (SYNTHROID ) 25 MCG tablet TAKE 1 TABLET BY MOUTH EVERY DAY BEFORE BREAKFAST 90 tablet 1   mirtazapine  (REMERON ) 7.5 MG tablet TAKE 1 TABLET BY MOUTH AT BEDTIME. 90 tablet 1   Multiple Vitamins-Minerals (PRESERVISION AREDS PO) Take 1 tablet by mouth daily.     omeprazole  (PRILOSEC) 40 MG capsule TAKE 1 CAPSULE (40 MG TOTAL) BY MOUTH DAILY. 90 capsule 1   tamsulosin  (FLOMAX ) 0.4 MG CAPS capsule TAKE 1 CAPSULE BY MOUTH EVERY DAY 90  capsule 0   Tiotropium Bromide (SPIRIVA  RESPIMAT) 2.5 MCG/ACT AERS INHALE 2 PUFFS BY MOUTH INTO THE LUNGS DAILY 4 g 1   Vitamins-Lipotropics (LIPOFLAVONOID) TABS Take 1 tablet by mouth in the morning, at noon, and at bedtime.     ELIQUIS  5 MG TABS tablet TAKE 1 TABLET BY MOUTH TWICE A DAY 180 tablet 1   Fluorouracil 5 % SOLN Apply topically as needed. Per his dermatologist     montelukast  (SINGULAIR ) 10 MG tablet TAKE 1 TABLET BY MOUTH EVERYDAY AT BEDTIME (Patient not taking: No sig reported) 90 tablet 1   Current Facility-Administered Medications  Medication Dose Route Frequency Provider Last Rate Last Admin   0.9 %  sodium chloride  infusion  500 mL Intravenous Once Venus Gilles V, DO        Allergies as of 03/02/2024 - Review Complete 03/02/2024  Allergen Reaction Noted   Amlodipine  Swelling 08/20/2011   Pneumococcal vaccines Swelling 04/16/2015   Tape Other (See Comments) 05/30/2015    Family History  Problem Relation Age of Onset   Stroke Brother    Cancer Brother        basosquamous cell carcinoma-head   Heart failure Mother    Colon cancer Cousin 50   Heart disease Neg Hx    Hyperlipidemia Neg Hx    Hypertension Neg Hx    Diabetes Neg Hx     Social History   Socioeconomic History   Marital status: Single    Spouse name: Not on file   Number of children: 0   Years of education: Not on file   Highest education level: Master's degree (e.g., MA, MS, MEng, MEd, MSW, MBA)  Occupational History   Occupation: band runner, broadcasting/film/video    Comment: retired  Tobacco Use   Smoking status: Former    Current packs/day: 0.00    Average packs/day: 3.0 packs/day for 17.0 years (51.0 ttl pk-yrs)    Types: Cigarettes    Start date: 03/29/1966    Quit date: 03/30/1983    Years since quitting: 40.9    Passive exposure: Never   Smokeless tobacco: Never  Vaping Use   Vaping status: Never Used  Substance and Sexual Activity   Alcohol use: No    Alcohol/week: 0.0 standard drinks of alcohol     Comment: 05/30/2015 recovering alcoholic; dry date 04/19/2011   Drug use: No    Types: Marijuana, LSD, Mescaline  Comment: 03/07/2015 stopped all drugs back in the 1980's   Sexual activity: Not Currently  Other Topics Concern   Not on file  Social History Narrative   Single   Social Drivers of Health   Financial Resource Strain: Low Risk  (09/22/2023)   Overall Financial Resource Strain (CARDIA)    Difficulty of Paying Living Expenses: Not hard at all  Food Insecurity: No Food Insecurity (09/22/2023)   Hunger Vital Sign    Worried About Running Out of Food in the Last Year: Never true    Ran Out of Food in the Last Year: Never true  Transportation Needs: No Transportation Needs (09/22/2023)   PRAPARE - Administrator, Civil Service (Medical): No    Lack of Transportation (Non-Medical): No  Physical Activity: Sufficiently Active (09/22/2023)   Exercise Vital Sign    Days of Exercise per Week: 7 days    Minutes of Exercise per Session: 50 min  Stress: Stress Concern Present (09/22/2023)   [employer] of Occupational Health - Occupational Stress Questionnaire    Feeling of Stress: To some extent  Social Connections: Moderately Isolated (09/22/2023)   Social Connection and Isolation Panel    Frequency of Communication with Friends and Family: Once a week    Frequency of Social Gatherings with Friends and Family: More than three times a week    Attends Religious Services: Never    Database Administrator or Organizations: Yes    Attends Engineer, Structural: More than 4 times per year    Marital Status: Never married  Intimate Partner Violence: Not At Risk (09/22/2023)   Humiliation, Afraid, Rape, and Kick questionnaire    Fear of Current or Ex-Partner: No    Emotionally Abused: No    Physically Abused: No    Sexually Abused: No    Physical Exam: Vital signs in last 24 hours: @BP  (!) 141/69   Pulse 87   Temp 97.6 F (36.4 C)   Resp (!) 99   Ht  5' 8 (1.727 m)   Wt 136 lb (61.7 kg)   BMI 20.68 kg/m  GEN: NAD EYE: Sclerae anicteric ENT: MMM CV: Non-tachycardic Pulm: CTA b/l GI: Soft, NT/ND NEURO:  Alert & Oriented x 3   Sandor Flatter, DO [facility]   03/02/2024 11:01 AM

## 2024-03-05 ENCOUNTER — Telehealth: Payer: Self-pay | Admitting: Lactation Services

## 2024-03-05 NOTE — Telephone Encounter (Signed)
  Follow up Call-     03/02/2024   10:00 AM  Call back number  Post procedure Call Back phone  # [phone]  Permission to leave phone message Yes     Patient questions:  Do you have a fever, pain , or abdominal swelling? No. Pain Score  0 *  Have you tolerated food without any problems? Yes.    Have you been able to return to your normal activities? Yes.    Do you have any questions about your discharge instructions: Diet   No. Medications  No. Follow up visit  No.  Do you have questions or concerns about your Care? No.  Actions: * If pain score is 4 or above: No action needed, pain <4.

## 2024-03-06 LAB — SURGICAL PATHOLOGY

## 2024-03-07 ENCOUNTER — Other Ambulatory Visit: Payer: Self-pay | Admitting: Internal Medicine

## 2024-03-07 DIAGNOSIS — K219 Gastro-esophageal reflux disease without esophagitis: Secondary | ICD-10-CM

## 2024-03-12 ENCOUNTER — Ambulatory Visit: Payer: Self-pay | Admitting: Gastroenterology

## 2024-03-26 ENCOUNTER — Encounter: Payer: Self-pay | Admitting: Internal Medicine

## 2024-03-26 ENCOUNTER — Ambulatory Visit: Payer: Self-pay | Admitting: Internal Medicine

## 2024-03-26 ENCOUNTER — Ambulatory Visit: Admitting: Internal Medicine

## 2024-03-26 VITALS — BP 136/64 | HR 73 | Temp 98.0°F | Resp 16 | Ht 68.0 in | Wt 136.2 lb

## 2024-03-26 DIAGNOSIS — E785 Hyperlipidemia, unspecified: Secondary | ICD-10-CM | POA: Diagnosis not present

## 2024-03-26 DIAGNOSIS — F331 Major depressive disorder, recurrent, moderate: Secondary | ICD-10-CM

## 2024-03-26 DIAGNOSIS — Z0001 Encounter for general adult medical examination with abnormal findings: Secondary | ICD-10-CM | POA: Diagnosis not present

## 2024-03-26 DIAGNOSIS — I484 Atypical atrial flutter: Secondary | ICD-10-CM | POA: Diagnosis not present

## 2024-03-26 DIAGNOSIS — I2510 Atherosclerotic heart disease of native coronary artery without angina pectoris: Secondary | ICD-10-CM

## 2024-03-26 DIAGNOSIS — I10 Essential (primary) hypertension: Secondary | ICD-10-CM

## 2024-03-26 DIAGNOSIS — N400 Enlarged prostate without lower urinary tract symptoms: Secondary | ICD-10-CM | POA: Diagnosis not present

## 2024-03-26 DIAGNOSIS — M5412 Radiculopathy, cervical region: Secondary | ICD-10-CM | POA: Diagnosis not present

## 2024-03-26 DIAGNOSIS — J431 Panlobular emphysema: Secondary | ICD-10-CM | POA: Diagnosis not present

## 2024-03-26 DIAGNOSIS — E039 Hypothyroidism, unspecified: Secondary | ICD-10-CM

## 2024-03-26 LAB — CBC WITH DIFFERENTIAL/PLATELET
Basophils Absolute: 0 K/uL (ref 0.0–0.1)
Basophils Relative: 0.2 % (ref 0.0–3.0)
Eosinophils Absolute: 0.1 K/uL (ref 0.0–0.7)
Eosinophils Relative: 1.3 % (ref 0.0–5.0)
HCT: 41.1 % (ref 39.0–52.0)
Hemoglobin: 14 g/dL (ref 13.0–17.0)
Lymphocytes Relative: 36 % (ref 12.0–46.0)
Lymphs Abs: 2.4 K/uL (ref 0.7–4.0)
MCHC: 34 g/dL (ref 30.0–36.0)
MCV: 93.3 fl (ref 78.0–100.0)
Monocytes Absolute: 0.6 K/uL (ref 0.1–1.0)
Monocytes Relative: 8.9 % (ref 3.0–12.0)
Neutro Abs: 3.5 K/uL (ref 1.4–7.7)
Neutrophils Relative %: 53.6 % (ref 43.0–77.0)
Platelets: 187 K/uL (ref 150.0–400.0)
RBC: 4.41 Mil/uL (ref 4.22–5.81)
RDW: 12.7 % (ref 11.5–15.5)
WBC: 6.6 K/uL (ref 4.0–10.5)

## 2024-03-26 LAB — LIPID PANEL
Cholesterol: 84 mg/dL (ref 28–200)
HDL: 36.3 mg/dL — ABNORMAL LOW
LDL Cholesterol: 20 mg/dL (ref 10–99)
NonHDL: 47.79
Total CHOL/HDL Ratio: 2
Triglycerides: 139 mg/dL (ref 10.0–149.0)
VLDL: 27.8 mg/dL (ref 0.0–40.0)

## 2024-03-26 LAB — BASIC METABOLIC PANEL WITH GFR
BUN: 18 mg/dL (ref 6–23)
CO2: 28 meq/L (ref 19–32)
Calcium: 9.1 mg/dL (ref 8.4–10.5)
Chloride: 104 meq/L (ref 96–112)
Creatinine, Ser: 0.83 mg/dL (ref 0.40–1.50)
GFR: 85.54 mL/min
Glucose, Bld: 106 mg/dL — ABNORMAL HIGH (ref 70–99)
Potassium: 4 meq/L (ref 3.5–5.1)
Sodium: 139 meq/L (ref 135–145)

## 2024-03-26 LAB — HEPATIC FUNCTION PANEL
ALT: 15 U/L (ref 3–53)
AST: 18 U/L (ref 5–37)
Albumin: 4.3 g/dL (ref 3.5–5.2)
Alkaline Phosphatase: 76 U/L (ref 39–117)
Bilirubin, Direct: 0.1 mg/dL (ref 0.1–0.3)
Total Bilirubin: 0.5 mg/dL (ref 0.2–1.2)
Total Protein: 6.5 g/dL (ref 6.0–8.3)

## 2024-03-26 LAB — TSH: TSH: 3.85 u[IU]/mL (ref 0.35–5.50)

## 2024-03-26 MED ORDER — FINASTERIDE 5 MG PO TABS: 5.0000 mg | ORAL_TABLET | Freq: Every day | ORAL | 1 refills | Status: AC

## 2024-03-26 MED ORDER — LEVOTHYROXINE SODIUM 25 MCG PO TABS: ORAL_TABLET | ORAL | 1 refills | Status: AC

## 2024-03-26 MED ORDER — TAMSULOSIN HCL 0.4 MG PO CAPS: 0.4000 mg | ORAL_CAPSULE | Freq: Every day | ORAL | 1 refills | Status: AC

## 2024-03-26 MED ORDER — HYDRALAZINE HCL 50 MG PO TABS: 50.0000 mg | ORAL_TABLET | Freq: Three times a day (TID) | ORAL | 1 refills | Status: AC

## 2024-03-26 MED ORDER — MIRTAZAPINE 7.5 MG PO TABS: 7.5000 mg | ORAL_TABLET | Freq: Every day | ORAL | 1 refills | Status: AC

## 2024-03-26 NOTE — Progress Notes (Signed)
 "  Subjective:  Patient ID: Roy Long, male    DOB: 1950/07/05  Age: 73 y.o. MRN: [id number]  CC: Hypertension, Hypothyroidism, and Annual Exam   HPI [name] presents for a CPX and f/up ---  Discussed the use of AI scribe software for clinical note transcription with the patient, who gave verbal consent to proceed.  History of Present Illness Roy Long is a 73 year old male with a history of cervical spine fusion who presents with left shoulder and neck pain.  He experiences discomfort and pain in the left shoulder and neck area, sometimes extending to the trapezius. This pain has been present since 2023-2024, during which he underwent physical therapy and dry needling, initially improving his symptoms. However, after resuming gym workouts, the pain returned, prompting him to reduce resistance in his exercises, which has provided some relief.  He experiences intermittent tingling from the neck down to the elbow, which is not consistently associated with pain. The tingling occurs during activities such as driving or sitting and tends to resolve with position changes. He recalls similar symptoms prior to his cervical spine fusion, which were significant enough to alert his surgeon at the time. He denies weakness and dropping objects, but does report intermittent tingling in his arm.  He takes 650 mg of Tylenol  for pain relief, which sometimes helps, although the pain often resolves on its own. The tingling episodes are brief and improve with movement. No recent episodes of dizziness or lightheadedness, except occasionally when performing specific exercises at the gym or after gardening.  He mentions experiencing shortness of breath when exerting himself, such as walking uphill, but notes that Spiriva  is effective in managing this symptom. He occasionally experiences brief palpitations, but not recently or severely enough to suggest a recurrence of atrial  fibrillation.     Outpatient Medications Prior to Visit  Medication Sig Dispense Refill   acyclovir  (ZOVIRAX ) 400 MG tablet TAKE 1 TABLET BY MOUTH EVERY DAY 90 tablet 1   atorvastatin  (LIPITOR ) 80 MG tablet TAKE 1 TABLET BY MOUTH EVERY DAY 90 tablet 1   cholecalciferol  (VITAMIN D ) 1000 UNITS tablet Take 1,000 Units by mouth daily.     cycloSPORINE  (RESTASIS ) 0.05 % ophthalmic emulsion Place 1 drop into both eyes 2 (two) times daily.     diltiazem  (DILT-XR) 240 MG 24 hr capsule TAKE 1 CAPSULE BY MOUTH ONCE DAILY 90 capsule 3   ELIQUIS  5 MG TABS tablet TAKE 1 TABLET BY MOUTH TWICE A DAY 180 tablet 1   famotidine  (PEPCID ) 40 MG tablet TAKE 1 TABLET BY MOUTH EVERYDAY AT BEDTIME 90 tablet 1   Fluorouracil 5 % SOLN Apply topically as needed. Per his dermatologist     Multiple Vitamins-Minerals (PRESERVISION AREDS PO) Take 1 tablet by mouth daily.     omeprazole  (PRILOSEC) 40 MG capsule TAKE 1 CAPSULE (40 MG TOTAL) BY MOUTH DAILY. 90 capsule 1   Tiotropium Bromide (SPIRIVA  RESPIMAT) 2.5 MCG/ACT AERS INHALE 2 PUFFS BY MOUTH INTO THE LUNGS DAILY 4 g 1   Vitamins-Lipotropics (LIPOFLAVONOID) TABS Take 1 tablet by mouth in the morning, at noon, and at bedtime.     finasteride  (PROSCAR ) 5 MG tablet TAKE 1 TABLET (5 MG TOTAL) BY MOUTH DAILY. 90 tablet 1   hydrALAZINE  (APRESOLINE ) 50 MG tablet Take 1 tablet (50 mg total) by mouth 3 (three) times daily. 270 tablet 1   levothyroxine  (SYNTHROID ) 25 MCG tablet TAKE 1 TABLET BY MOUTH EVERY DAY BEFORE BREAKFAST 90  tablet 1   mirtazapine  (REMERON ) 7.5 MG tablet TAKE 1 TABLET BY MOUTH AT BEDTIME. 90 tablet 1   tamsulosin  (FLOMAX ) 0.4 MG CAPS capsule TAKE 1 CAPSULE BY MOUTH EVERY DAY 90 capsule 0   montelukast  (SINGULAIR ) 10 MG tablet TAKE 1 TABLET BY MOUTH EVERYDAY AT BEDTIME (Patient not taking: Reported on 03/26/2024) 90 tablet 1   0.9 %  sodium chloride  infusion      No facility-administered medications prior to visit.    ROS Review of Systems   Constitutional:  Negative for appetite change, chills, diaphoresis, fatigue and fever.  HENT: Negative.  Negative for trouble swallowing.   Eyes: Negative.   Respiratory:  Positive for shortness of breath. Negative for cough, chest tightness and wheezing.   Cardiovascular:  Negative for chest pain, palpitations and leg swelling.  Gastrointestinal: Negative.  Negative for abdominal pain, constipation, diarrhea, nausea and vomiting.  Endocrine: Negative.  Negative for cold intolerance and heat intolerance.  Genitourinary: Negative.  Negative for difficulty urinating.  Musculoskeletal:  Positive for arthralgias and neck pain. Negative for gait problem.  Skin: Negative.   Neurological:  Negative for dizziness, weakness, light-headedness and numbness.  Hematological:  Negative for adenopathy. Does not bruise/bleed easily.    Objective:  BP 136/64 (BP Location: Left Arm, Patient Position: Sitting, Cuff Size: Normal)   Pulse 73   Temp 98 F (36.7 C) (Oral)   Resp 16   Ht 5' 8 (1.727 m)   Wt 136 lb 3.2 oz (61.8 kg)   SpO2 98%   BMI 20.71 kg/m   BP Readings from Last 3 Encounters:  03/26/24 136/64  03/02/24 (!) 140/73  02/07/24 138/62    Wt Readings from Last 3 Encounters:  03/26/24 136 lb 3.2 oz (61.8 kg)  03/02/24 136 lb (61.7 kg)  02/07/24 136 lb (61.7 kg)    Physical Exam Vitals reviewed.  Constitutional:      Appearance: Normal appearance.  HENT:     Nose: Nose normal.     Mouth/Throat:     Mouth: Mucous membranes are moist.  Eyes:     General: No scleral icterus.    Conjunctiva/sclera: Conjunctivae normal.  Cardiovascular:     Rate and Rhythm: Normal rate and regular rhythm.     Heart sounds: No murmur heard.    No friction rub. No gallop.     Comments: EKG--- NSR, 68 bpm LAD Anteroseptal infarct pattern is not new No LVH Unchanged  Pulmonary:     Effort: Pulmonary effort is normal.     Breath sounds: No stridor. No wheezing, rhonchi or rales.   Abdominal:     General: Abdomen is flat.     Palpations: There is no mass.     Tenderness: There is no abdominal tenderness. There is no guarding.     Hernia: No hernia is present.  Musculoskeletal:        General: Normal range of motion.     Cervical back: Neck supple. No tenderness.     Right lower leg: No edema.     Left lower leg: No edema.  Lymphadenopathy:     Cervical: No cervical adenopathy.  Skin:    General: Skin is warm and dry.  Neurological:     General: No focal deficit present.     Mental Status: He is alert. Mental status is at baseline.     Cranial Nerves: Cranial nerves 2-12 are intact.     Sensory: Sensation is intact.     Motor: Motor  function is intact.     Coordination: Coordination is intact.     Gait: Gait is intact.     Deep Tendon Reflexes: Reflexes normal.     Reflex Scores:      Tricep reflexes are 1+ on the right side and 1+ on the left side.      Bicep reflexes are 1+ on the right side and 1+ on the left side.      Brachioradialis reflexes are 1+ on the right side and 1+ on the left side.      Patellar reflexes are 1+ on the right side and 1+ on the left side.      Achilles reflexes are 1+ on the right side and 1+ on the left side. Psychiatric:        Mood and Affect: Mood normal.        Behavior: Behavior normal.     Lab Results  Component Value Date   WBC 6.6 03/26/2024   HGB 14.0 03/26/2024   HCT 41.1 03/26/2024   PLT 187.0 03/26/2024   GLUCOSE 106 (H) 03/26/2024   CHOL 84 03/26/2024   TRIG 139.0 03/26/2024   HDL 36.30 (L) 03/26/2024   LDLCALC 20 03/26/2024   ALT 15 03/26/2024   AST 18 03/26/2024   NA 139 03/26/2024   K 4.0 03/26/2024   CL 104 03/26/2024   CREATININE 0.83 03/26/2024   BUN 18 03/26/2024   CO2 28 03/26/2024   TSH 3.85 03/26/2024   PSA 0.10 09/22/2023   INR 1.0 04/15/2011   HGBA1C 5.5 04/18/2020    MR Cervical Spine w/o contrast Result Date: 07/22/2022 CLINICAL DATA:  73 year old male with left side neck  pain radiating to the shoulder. Stiffness, decreased range of motion since November. Prior surgery. EXAM: MRI CERVICAL SPINE WITHOUT CONTRAST TECHNIQUE: Multiplanar, multisequence MR imaging of the cervical spine was performed. No intravenous contrast was administered. COMPARISON:  Cervical spine radiographs 03/11/2022. FINDINGS: Alignment: Stable since December, mild straightening of lordosis. Subtle anterolisthesis of C7 on T1. No other significant spondylolisthesis. Vertebrae: Normal background bone marrow signal. Mild hardware susceptibility artifact from C5-C6 and C6-C7 ACDF. Evidence of solid arthrodesis at those levels. Moderate patchy marrow edema affecting the chronically degenerated left C3-C4 facets (series 5, images 14 and 15). Normal background bone marrow signal. Superimposed right side facet ankylosis at C2-C3. No other marrow edema or acute osseous abnormality. In the cervical spine. However, there is evidence of mild degenerative posterior element marrow edema in the right upper thoracic spine T1 and T2 (facets/pars series 5, image 4). Cord: Degenerative spinal cord mass effect is mild at C3-C4 and C4-C5. No spinal cord edema or myelomalacia. Negative visible upper thoracic cord. Posterior Fossa, vertebral arteries, paraspinal tissues: Cervicomedullary junction is within normal limits. Negative visible posterior fossa. Maintained major vascular flow voids in the neck with dominant appearing left vertebral artery. Negative visible neck soft tissues, lung apices. Disc levels: C2-C3:  Ankylosed right facet. No stenosis. C3-C4: Broad-based posterior disc bulge or protrusion with foraminal and posterior endplate spurring. Moderate ligament flavum (series 7, image 9) and mild to moderate facet hypertrophy which is greater on the left (marrow edema there described above). Mild spinal stenosis and spinal cord mass effect. Moderate to severe left and mild-to-moderate right C4 neural foraminal stenosis.  C4-C5: Disc space loss with circumferential disc bulge. Broad-based posterior component. Similar endplate spurring. Moderate facet and ligament flavum hypertrophy (series 7, image 13). Mild spinal stenosis and spinal cord mass effect. Moderate to severe  bilateral C5 foraminal stenosis. C5-C6:  Prior ACDF with solid arthrodesis.  No stenosis. C6-C7:  Prior ACDF with solid arthrodesis.  No stenosis. C7-T1: Subtle anterolisthesis. Mild facet and ligament flavum hypertrophy. No stenosis. T1-T2: Subtle anterolisthesis and disc bulging. Moderate facet hypertrophy with evidence of a left side foraminal 6 mm degenerative synovial cyst (series 5, image 12). Associated mild to moderate left T1 and mild right T1 neural foraminal stenosis. T2-T3: Negative, no stenosis. IMPRESSION: 1. Prior ACDF at C5-C6 and C6-C7 with solid arthrodesis and no adverse features. And superimposed right side facet ankylosis at C2-C3. 2. 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 Subsequent mild multifactorial spinal stenosis AND spinal cord mass effect at both levels. No cord signal abnormality. Moderate to severe bilateral C4 and C5 neural foraminal stenosis. 3. Lesser adjacent segment disease at C7-T1 with facet hypertrophy. And moderate facet arthropathy at the T1-T2 level including a left foraminal synovial cyst contributing to moderate to severe left T1 neural foraminal stenosis. Electronically Signed   By: VEAR Hurst M.D.   On: 07/22/2022 14:17   Estimated Creatinine Clearance: 67.2 mL/min (by C-G formula based on SCr of 0.83 mg/dL).   The ASCVD Risk score (Arnett DK, et al., 2019) failed to calculate for the following reasons:   The valid total cholesterol range is 130 to 320 mg/dL   Assessment & Plan:  Atypical atrial flutter (HCC)- He has good R/R control. -     TSH;  Future  Acquired hypothyroidism- He is euthyroid. -     Lipid panel; Future -     Levothyroxine  Sodium; TAKE 1 TABLET BY MOUTH EVERY DAY BEFORE BREAKFAST  Dispense: 90 tablet; Refill: 1  Essential hypertension- BP is well controlled. -     TSH; Future -     Hepatic function panel; Future -     EKG 12-Lead -     Basic metabolic panel with GFR; Future -     CBC with Differential/Platelet; Future -     hydrALAZINE  HCl; Take 1 tablet (50 mg total) by mouth 3 (three) times daily.  Dispense: 270 tablet; Refill: 1  Hyperlipidemia with target LDL less than 70- LDL goal achieved. Doing well on the statin  -     TSH; Future -     Hepatic function panel; Future  Panlobular emphysema (HCC)- Doing well on the LAMA.  Encounter for general adult medical examination with abnormal findings- Exam completed, labs reviewed, vaccines reviewed, no cancer screenings indicated, pt ed material was given.   Radiculitis of left cervical region -     Ambulatory referral to Physical Medicine Rehab  Benign prostatic hyperplasia without lower urinary tract symptoms -     Finasteride ; Take 1 tablet (5 mg total) by mouth daily.  Dispense: 90 tablet; Refill: 1  Moderate episode of recurrent major depressive disorder (HCC) -     Mirtazapine ; Take 1 tablet (7.5 mg total) by mouth at bedtime.  Dispense: 90 tablet; Refill: 1  Coronary artery disease involving native coronary artery of native heart without angina pectoris -     hydrALAZINE  HCl; Take 1 tablet (50 mg total) by mouth 3 (three) times daily.  Dispense: 270 tablet; Refill: 1  BPH (benign prostatic hyperplasia) -     Tamsulosin  HCl; Take 1 capsule (0.4 mg total) by mouth daily.  Dispense: 90 capsule; Refill: 1  Follow-up: Return in about 6 months (around 09/24/2024).  Debby Molt, MD "

## 2024-03-26 NOTE — Patient Instructions (Signed)
 Health Maintenance, Male  Adopting a healthy lifestyle and getting preventive care are important in promoting health and wellness. Ask your health care provider about:  The right schedule for you to have regular tests and exams.  Things you can do on your own to prevent diseases and keep yourself healthy.  What should I know about diet, weight, and exercise?  Eat a healthy diet    Eat a diet that includes plenty of vegetables, fruits, low-fat dairy products, and lean protein.  Do not eat a lot of foods that are high in solid fats, added sugars, or sodium.  Maintain a healthy weight  Body mass index (BMI) is a measurement that can be used to identify possible weight problems. It estimates body fat based on height and weight. Your health care provider can help determine your BMI and help you achieve or maintain a healthy weight.  Get regular exercise  Get regular exercise. This is one of the most important things you can do for your health. Most adults should:  Exercise for at least 150 minutes each week. The exercise should increase your heart rate and make you sweat (moderate-intensity exercise).  Do strengthening exercises at least twice a week. This is in addition to the moderate-intensity exercise.  Spend less time sitting. Even light physical activity can be beneficial.  Watch cholesterol and blood lipids  Have your blood tested for lipids and cholesterol at 75 years of age, then have this test every 5 years.  You may need to have your cholesterol levels checked more often if:  Your lipid or cholesterol levels are high.  You are older than 75 years of age.  You are at high risk for heart disease.  What should I know about cancer screening?  Many types of cancers can be detected early and may often be prevented. Depending on your health history and family history, you may need to have cancer screening at various ages. This may include screening for:  Colorectal cancer.  Prostate cancer.  Skin cancer.  Lung  cancer.  What should I know about heart disease, diabetes, and high blood pressure?  Blood pressure and heart disease  High blood pressure causes heart disease and increases the risk of stroke. This is more likely to develop in people who have high blood pressure readings or are overweight.  Talk with your health care provider about your target blood pressure readings.  Have your blood pressure checked:  Every 3-5 years if you are 24-52 years of age.  Every year if you are 3 years old or older.  If you are between the ages of 60 and 72 and are a current or former smoker, ask your health care provider if you should have a one-time screening for abdominal aortic aneurysm (AAA).  Diabetes  Have regular diabetes screenings. This checks your fasting blood sugar level. Have the screening done:  Once every three years after age 66 if you are at a normal weight and have a low risk for diabetes.  More often and at a younger age if you are overweight or have a high risk for diabetes.  What should I know about preventing infection?  Hepatitis B  If you have a higher risk for hepatitis B, you should be screened for this virus. Talk with your health care provider to find out if you are at risk for hepatitis B infection.  Hepatitis C  Blood testing is recommended for:  Everyone born from [birth year] through 1967.  Anyone  with known risk factors for hepatitis C.  Sexually transmitted infections (STIs)  You should be screened each year for STIs, including gonorrhea and chlamydia, if:  You are sexually active and are younger than 75 years of age.  You are older than 75 years of age and your health care provider tells you that you are at risk for this type of infection.  Your sexual activity has changed since you were last screened, and you are at increased risk for chlamydia or gonorrhea. Ask your health care provider if you are at risk.  Ask your health care provider about whether you are at high risk for HIV. Your health care provider  may recommend a prescription medicine to help prevent HIV infection. If you choose to take medicine to prevent HIV, you should first get tested for HIV. You should then be tested every 3 months for as long as you are taking the medicine.  Follow these instructions at home:  Alcohol use  Do not drink alcohol if your health care provider tells you not to drink.  If you drink alcohol:  Limit how much you have to 0-2 drinks a day.  Know how much alcohol is in your drink. In the [place] one drink equals one 12 oz bottle of beer (355 mL), one 5 oz glass of wine (148 mL), or one 1 oz glass of hard liquor (44 mL).  Lifestyle  Do not use any products that contain nicotine or tobacco. These products include cigarettes, chewing tobacco, and vaping devices, such as e-cigarettes. If you need help quitting, ask your health care provider.  Do not use street drugs.  Do not share needles.  Ask your health care provider for help if you need support or information about quitting drugs.  General instructions  Schedule regular health, dental, and eye exams.  Stay current with your vaccines.  Tell your health care provider if:  You often feel depressed.  You have ever been abused or do not feel safe at home.  Summary  Adopting a healthy lifestyle and getting preventive care are important in promoting health and wellness.  Follow your health care provider's instructions about healthy diet, exercising, and getting tested or screened for diseases.  Follow your health care provider's instructions on monitoring your cholesterol and blood pressure.  This information is not intended to replace advice given to you by your health care provider. Make sure you discuss any questions you have with your health care provider.  Document Revised: 08/04/2020 Document Reviewed: 08/04/2020  Elsevier Patient Education  2024 [employer].

## 2024-04-03 DIAGNOSIS — I480 Paroxysmal atrial fibrillation: Secondary | ICD-10-CM

## 2024-04-03 NOTE — Telephone Encounter (Signed)
 Prescription refill request for Eliquis  received. Indication:afib Last office visit:4/25 Scr: 0.83  12/25 Age:74 Weight:61.8  kg  Prescription refilled

## 2024-04-07 ENCOUNTER — Other Ambulatory Visit: Payer: Self-pay | Admitting: Internal Medicine

## 2024-04-07 DIAGNOSIS — K219 Gastro-esophageal reflux disease without esophagitis: Secondary | ICD-10-CM

## 2024-04-07 DIAGNOSIS — K2100 Gastro-esophageal reflux disease with esophagitis, without bleeding: Secondary | ICD-10-CM

## 2024-04-11 ENCOUNTER — Ambulatory Visit: Admitting: Physical Medicine and Rehabilitation

## 2024-04-11 ENCOUNTER — Encounter: Payer: Self-pay | Admitting: Physical Medicine and Rehabilitation

## 2024-04-11 DIAGNOSIS — M5412 Radiculopathy, cervical region: Secondary | ICD-10-CM

## 2024-04-11 DIAGNOSIS — M7918 Myalgia, other site: Secondary | ICD-10-CM

## 2024-04-11 DIAGNOSIS — M961 Postlaminectomy syndrome, not elsewhere classified: Secondary | ICD-10-CM

## 2024-04-11 NOTE — Progress Notes (Signed)
 "  Roy Long - 74 y.o. male MRN [id number]  Date of birth: 13-Oct-1950  Office Visit Note: Visit Date: 04/11/2024 PCP: Joshua Debby LITTIE, MD Referred by: Joshua Debby LITTIE, MD  Subjective: Chief Complaint  Patient presents with   Neck - Pain    Pain in neck and sometimes radiates down L arm. He is having some tingling off and on. Pain for months. Takes Tylenol  as needed for pain. Worse when in the car for a while or sitting for long. Tries to work out. No previous injections.  Previous ACDF 13-14 years.   Left Shoulder - Pain   HPI: Roy Long is a 74 y.o. male who comes in today per the request of Dr. Debby Joshua for evaluation of chronic, worsening and severe left sided neck pain radiating to shoulder, trapezius and left upper arm. Pain ongoing intermittently for over a year. He also reports tingling to left shoulder and arm. His pain becomes worse when sitting to drive. Also reports increased pain post activity, such as resistance training. He describes his pain as sore and aching sensation, average pain between 5 and 8. Some relief of pain with formal physical therapy/dry needling treatments. He does take Tylenol  as needed. Cervical MRI imaging from 2024 shows prior ACDF C5-C6 and C6-C7. 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 ACDF C5-C6 and C6-C7 done about 13 years ago in [place]. No history of cervical epidural steroid injection. Patient denies focal weakness, numbness and tingling. No recent trauma or falls. He denies issues gripping objects.       Review of Systems  Musculoskeletal:  Positive for neck pain.  Neurological:  Positive for tingling. Negative for sensory change, focal weakness and weakness.  All other systems reviewed and are negative.  Otherwise per HPI.  Assessment & Plan: Visit Diagnoses:    ICD-10-CM   1. Radiculopathy, cervical region   M54.12 Ambulatory referral to Physical Medicine Rehab    2. Post laminectomy syndrome  M96.1 Ambulatory referral to Physical Medicine Rehab    3. Myofascial pain syndrome  M79.18 Ambulatory referral to Physical Medicine Rehab       Plan: Findings:  Chronic, worsening and severe left sided neck pain radiating to shoulder and left upper arm. Paresthesias to left shoulder and arm. Patient continues to have severe pain despite good conservative therapies such as formal physical therapy/dry needling, home exercise regimen, rest and use of medications. Patients clinical presentation and exam are consistent with cervical radiculopathy. There is adjacent segment disease above fusion at L4-L5 that could be contributing to his symptoms. I also feel there is a myofascial component contributing to his pain. There is palpable myofascial trigger point left medial scapular border today. This area is tender to touch. We discussed treatment plan in detail today. Next step is to place order for diagnostic and hopefully therapeutic left C7-T1 interlaminar epidural steroid injection. He is currently taking Eliquis , we will need permission for him to discontinue to his medication prior to injection. I discussed injection procedure in detail today, he has no questions at this time. Could also look at re-grouping with formal physical therapy for dry needling. His exam today is non focal, good strength to bilateral upper extremities. No myelopathic symptoms noted upon exam today.     Meds & Orders: No orders of the defined types were placed in this encounter.   Orders Placed This Encounter  Procedures   Ambulatory referral to Physical Medicine Rehab    Follow-up: Return for Left C7-T1 interlaminar epidural steroid injection.   Procedures: No procedures performed      Clinical History: CLINICAL DATA:  74 year old male with left side neck pain radiating to the shoulder. Stiffness, decreased range of motion  since November. Prior surgery.   EXAM: MRI CERVICAL SPINE WITHOUT CONTRAST   TECHNIQUE: Multiplanar, multisequence MR imaging of the cervical spine was performed. No intravenous contrast was administered.   COMPARISON:  Cervical spine radiographs 03/11/2022.   FINDINGS: Alignment: Stable since December, mild straightening of lordosis. Subtle anterolisthesis of C7 on T1. No other significant spondylolisthesis.   Vertebrae: Normal background bone marrow signal. Mild hardware susceptibility artifact from C5-C6 and C6-C7 ACDF. Evidence of solid arthrodesis at those levels.   Moderate patchy marrow edema affecting the chronically degenerated left C3-C4 facets (series 5, images 14 and 15). Normal background bone marrow signal. Superimposed right side facet ankylosis at C2-C3.   No other marrow edema or acute osseous abnormality. In the cervical spine.   However, there is evidence of mild degenerative posterior element marrow edema in the right upper thoracic spine T1 and T2 (facets/pars series 5, image 4).   Cord: Degenerative spinal cord mass effect is mild at C3-C4 and C4-C5. No spinal cord edema or myelomalacia. Negative visible upper thoracic cord.   Posterior Fossa, vertebral arteries, paraspinal tissues: Cervicomedullary junction is within normal limits. Negative visible posterior fossa.   Maintained major vascular flow voids in the neck with dominant appearing left vertebral artery. Negative visible neck soft tissues, lung apices.   Disc levels:   C2-C3:  Ankylosed right facet. No stenosis.   C3-C4: Broad-based posterior disc bulge or protrusion with foraminal and posterior endplate spurring. Moderate ligament flavum (series 7, image 9) and mild to moderate facet hypertrophy which is greater on the left (marrow edema there described above). Mild spinal stenosis and spinal cord mass effect. Moderate to severe left and mild-to-moderate right C4 neural foraminal  stenosis.   C4-C5: Disc space loss with circumferential disc bulge. Broad-based posterior component. Similar endplate spurring. Moderate facet and ligament flavum hypertrophy (series 7, image 13). Mild spinal stenosis and spinal cord mass effect. Moderate to severe bilateral C5 foraminal stenosis.   C5-C6:  Prior ACDF with solid arthrodesis.  No stenosis.   C6-C7:  Prior ACDF with solid arthrodesis.  No stenosis.   C7-T1: Subtle anterolisthesis. Mild facet and ligament flavum hypertrophy. No stenosis.   T1-T2: Subtle anterolisthesis and disc bulging. Moderate facet hypertrophy with evidence of a left side foraminal 6 mm degenerative synovial cyst (series 5, image 12). Associated mild to moderate left T1 and mild right T1 neural foraminal stenosis.   T2-T3: Negative, no stenosis.   IMPRESSION: 1. Prior ACDF at C5-C6 and C6-C7 with solid arthrodesis and no adverse features. And superimposed right side facet ankylosis at C2-C3.   2. 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 Subsequent mild multifactorial spinal stenosis AND spinal cord mass effect at both levels. No cord signal abnormality. Moderate to severe bilateral C4 and C5 neural foraminal stenosis.   3. Lesser adjacent segment disease at C7-T1 with facet hypertrophy. And moderate facet arthropathy at the T1-T2 level including a left foraminal synovial cyst contributing to moderate to severe left T1 neural foraminal stenosis.     Electronically Signed   By:  VEAR Hurst M.D.   On: 07/22/2022 14:17   He reports that he quit smoking about 41 years ago. His smoking use included cigarettes. He started smoking about 58 years ago. He has a 51 pack-year smoking history. He has never been exposed to tobacco smoke. He has never used smokeless tobacco. No results for  input(s): HGBA1C, LABURIC in the last 8760 hours.  Objective:  VS:  HT:    WT:   BMI:     BP:   HR: bpm  TEMP: ( )  RESP:  Physical Exam Vitals and nursing note reviewed.  HENT:     Head: Normocephalic and atraumatic.     Right Ear: External ear normal.     Left Ear: External ear normal.     Nose: Nose normal.     Mouth/Throat:     Mouth: Mucous membranes are moist.  Eyes:     Extraocular Movements: Extraocular movements intact.  Cardiovascular:     Rate and Rhythm: Normal rate.     Pulses: Normal pulses.  Pulmonary:     Effort: Pulmonary effort is normal.  Abdominal:     General: Abdomen is flat. There is distension.  Musculoskeletal:        General: Tenderness present.     Cervical back: Tenderness present.     Comments: No discomfort noted with flexion, extension and side-to-side rotation. Patient has good strength in the upper extremities including 5 out of 5 strength in wrist extension, long finger flexion and APB. Shoulder range of motion is full bilaterally without any sign of impingement. There is no atrophy of the hands intrinsically. Sensation intact bilaterally. Palpable myofascial trigger point to left medial scapular border upon palpation. Negative Hoffman's sign. Negative Spurling's sign.     Skin:    General: Skin is warm and dry.     Capillary Refill: Capillary refill takes less than 2 seconds.  Neurological:     General: No focal deficit present.     Mental Status: He is alert and oriented to person, place, and time.  Psychiatric:        Mood and Affect: Mood normal.        Behavior: Behavior normal.     Ortho Exam  Imaging: No results found.  Past Medical/Family/Surgical/Social History: Medications & Allergies reviewed per EMR, new medications updated. Patient Active Problem List   Diagnosis Date Noted   Radiculitis of left cervical region 03/26/2024   Encounter for general adult medical examination with abnormal findings 02/03/2023    Moderate episode of recurrent major depressive disorder (HCC) 01/19/2022   Hearing loss due to cerumen impaction, bilateral 07/13/2021   Panlobular emphysema (HCC) 01/07/2021   Neuropathy involving both lower extremities 04/18/2020   Seasonal allergic rhinitis due to pollen 10/24/2018   Essential hypertension 04/25/2018   Laryngopharyngeal reflux (LPR) 02/07/2018   Kidney stone on left side 03/30/2016   IBS (irritable bowel syndrome) 06/27/2015   Coronary artery disease involving native coronary artery of native heart without angina pectoris 06/26/2015   OAB (overactive bladder) 04/16/2015   Atrial flutter (HCC)    Hyperlipidemia with target LDL less than 70 01/18/2014   BPH (benign prostatic hyperplasia) 04/23/2011   Hypothyroidism 04/15/2011   Atrial fibrillation (HCC)    Past Medical History:  Diagnosis Date   Age-related macular degeneration, dry, left eye    Alcohol dependence (HCC)    Arthritis    maybe a little bit in my left knee and right forefinger (05/30/2015)  Atrial fibrillation (HCC)    ablation 05-2015 with no reoccurance as of 09-2015   Basal cell carcinoma    Depression    hx   Family history of adverse reaction to anesthesia    father had head injuries and dementia; everytime he was put under less of his mentation came back   Gastroparesis    GERD (gastroesophageal reflux disease)    History of kidney stones    Hypercholesterolemia dx'd 02/2015   Hypertension    Hypothyroidism    Kidney stones    Migraine aura without headache    very rare now (05/30/2015)   Pneumonia ~ 2004   Squamous carcinoma    hand, chest   Family History  Problem Relation Age of Onset   Stroke Brother    Cancer Brother        basosquamous cell carcinoma-head   Heart failure Mother    Colon cancer Cousin 50   Heart disease Neg Hx    Hyperlipidemia Neg Hx    Hypertension Neg Hx    Diabetes Neg Hx    Past Surgical History:  Procedure Laterality Date   ANTERIOR CERVICAL  DECOMP/DISCECTOMY FUSION  04/2009   ATRIAL FIBRILLATION ABLATION  05/30/2015   BACK SURGERY     BASAL CELL CARCINOMA EXCISION     chest or back or left arm   CARDIOVERSION  06/2002   CATARACT EXTRACTION W/ INTRAOCULAR LENS IMPLANT Left ~ 2012   COLONOSCOPY     COLONOSCOPY W/ POLYPECTOMY  02/2003   CYSTOSCOPY W/ STONE MANIPULATION  1980's   basketted it out; no stent   ELECTROPHYSIOLOGIC STUDY N/A 05/30/2015   Procedure: Atrial Fibrillation Ablation;  Surgeon: Will Gladis Norton, MD;  Location: [facility];  Service: Cardiovascular;  Laterality: N/A;   EXTRACORPOREAL SHOCK WAVE LITHOTRIPSY Left 04/12/2016   Procedure: LEFT EXTRACORPOREAL SHOCK WAVE LITHOTRIPSY (ESWL);  Surgeon: Norleen Seltzer, MD;  Location: [facility];  Service: Urology;  Laterality: Left;   INGUINAL HERNIA REPAIR Bilateral 2004   INGUINAL HERNIA REPAIR Bilateral 2004   MOHS SURGERY Right    temple   SQUAMOUS CELL CARCINOMA EXCISION     chest or back or left arm   Social History   Occupational History   Occupation: band runner, broadcasting/film/video    Comment: retired  Tobacco Use   Smoking status: Former    Current packs/day: 0.00    Average packs/day: 3.0 packs/day for 17.0 years (51.0 ttl pk-yrs)    Types: Cigarettes    Start date: 03/29/1966    Quit date: 03/30/1983    Years since quitting: 41.0    Passive exposure: Never   Smokeless tobacco: Never  Vaping Use   Vaping status: Never Used  Substance and Sexual Activity   Alcohol use: No    Alcohol/week: 0.0 standard drinks of alcohol    Comment: 05/30/2015 recovering alcoholic; dry date 04/19/2011   Drug use: No    Types: Marijuana, LSD, Mescaline    Comment: 03/07/2015 stopped all drugs back in the 1980's   Sexual activity: Not Currently   "

## 2024-04-25 ENCOUNTER — Telehealth (HOSPITAL_BASED_OUTPATIENT_CLINIC_OR_DEPARTMENT_OTHER): Payer: Self-pay

## 2024-04-25 NOTE — Telephone Encounter (Signed)
 Pharmacy, can you please provide recommendations for holding Eliquis  for upcoming procedure?  Of note for pre-op APP team, he will need a virtual visit following pharmacy recommendations.  Thank you!

## 2024-04-25 NOTE — Telephone Encounter (Signed)
"  ° °  Pre-operative Risk Assessment    Patient Name: Roy Long  DOB: 1950-05-21 MRN: [id number]   Date of last office visit: 07/15/23 with Lesia Date of next office visit: NA  Request for Surgical Clearance    Procedure:  Epidural steroid injection   Date of Surgery:  Clearance TBD                                 Surgeon:  Not indicated Surgeon's Group or Practice Name:  [facility] Phone number:  [phone] Fax number:  [phone]   Type of Clearance Requested:   - Medical  - Pharmacy:  Hold Apixaban  (Eliquis ) for 2 days prior   Type of Anesthesia:  Not Indicated   Additional requests/questions:    [name]   04/25/2024, 10:42 AM   "

## 2024-04-28 ENCOUNTER — Other Ambulatory Visit: Payer: Self-pay | Admitting: Internal Medicine

## 2024-04-28 DIAGNOSIS — J431 Panlobular emphysema: Secondary | ICD-10-CM

## 2024-04-30 ENCOUNTER — Telehealth: Payer: Self-pay

## 2024-04-30 NOTE — Telephone Encounter (Signed)
 Pt was scheduled for a virtual visit 05/08/24.

## 2024-04-30 NOTE — Telephone Encounter (Signed)
" ° °  Name: Roy Long  DOB: [birth year]  MRN: [id number]  Primary Cardiologist: None   Preoperative team, please contact this patient and set up a phone call appointment for further preoperative risk assessment. Please obtain consent and complete medication review. Thank you for your help.  I confirm that guidance regarding antiplatelet and oral anticoagulation therapy has been completed and, if necessary, noted below.  Per Pharm D, patient has not had an Afib/aflutter ablation within the last 3 months, DCCV within the last 4 weeks, or Watchman in the last 45 days. Patient may hold Eliquis  for 3 days prior to procedure.    I also confirmed the patient resides in the state of [place] . As per [facility] Medical Board telemedicine laws, the patient must reside in the state in which the provider is licensed.    Barnie Hila, NP 04/30/2024, 9:04 AM [facility]     "

## 2024-05-03 ENCOUNTER — Ambulatory Visit: Admitting: Physical Medicine and Rehabilitation

## 2024-05-03 ENCOUNTER — Encounter: Payer: Self-pay | Admitting: Physical Medicine and Rehabilitation

## 2024-05-03 ENCOUNTER — Other Ambulatory Visit: Payer: Self-pay

## 2024-05-03 DIAGNOSIS — S39012A Strain of muscle, fascia and tendon of lower back, initial encounter: Secondary | ICD-10-CM

## 2024-05-03 DIAGNOSIS — M545 Low back pain, unspecified: Secondary | ICD-10-CM

## 2024-05-03 MED ORDER — METHOCARBAMOL 500 MG PO TABS: 500.0000 mg | ORAL_TABLET | Freq: Three times a day (TID) | ORAL | 0 refills | Status: AC

## 2024-05-03 NOTE — Progress Notes (Unsigned)
 Pain Scale   Average Pain 2 Patient advising he has lower back pain that started x 2 weeks ago after moving some fire wood near house prior to winter weather.        +Driver, -BT, -Dye Allergies.

## 2024-05-03 NOTE — Progress Notes (Unsigned)
 "  Roy Long - 74 y.o. male MRN [id number]  Date of birth: 07/23/1950  Office Visit Note: Visit Date: 05/03/2024 PCP: Joshua Debby LITTIE, MD Referred by: Joshua Debby LITTIE, MD  Subjective: Chief Complaint  Patient presents with   Lower Back - Pain   HPI: Roy Long is a 74 y.o. male who comes in today for evaluation of acute bilateral lower back pain. We have seen him in the past for more neck issues, we are planning to proceed with cervical epidural steroid injection in the coming weeks. His lower back pain started on 04/21/2024 while moving garbage can of firewood. His pain did improve with Tylenol , however returned a few days ago. He has pain when attempting to get out of bed. He describes pain as sharp sensation, currently rates as 2 out of 10. Some relief of pain with home exercise regimen, rest and use of Tylenol . He does work out at [employer] several days a week. No prior imaging of lumbar spine. No history of lumbar surgery/injections. Patient denies focal weakness, numbness and tingling. No recent trauma or falls.       Review of Systems  Musculoskeletal:  Positive for back pain and myalgias.  Neurological:  Negative for tingling, sensory change, focal weakness and weakness.  All other systems reviewed and are negative.  Otherwise per HPI.  Assessment & Plan: Visit Diagnoses:    ICD-10-CM   1. Acute bilateral low back pain without sciatica  M54.50 XR Lumbar Spine 2-3 Views    2. Acute myofascial strain of lumbar region, initial encounter  S39.012A        Plan: Findings:  Acute bilateral lower back pain. No radicular symptoms down the legs. His pain is mild today, he continues to home exercise regimen, rest and use of medications. Patients clinical presentation and exam are consistent with lumbar myofascial stain. I obtained lumbar radiographs in the office today that looks fairly normal for his age. There are mild multi level degenerative changes to lower lumbar spine. No  spondylolisthesis. I explained to patient that lumbar strain injuries can take 6-8 weeks to heal. We discussed treatment options in detail. I placed order for short course of formal physical therapy with a focus on home exercise regimen and core strengthening. He can take Tylenol  500 mg up to three times a day as needed. I also prescribed Robaxin  500 mg TID as needed. I encouraged him to remain active at home, to continue exercising at [facility]. We are happy to see him back as needed. His exam today is non focal, good strength noted to bilateral lower extremities.     Meds & Orders: No orders of the defined types were placed in this encounter.   Orders Placed This Encounter  Procedures   XR Lumbar Spine 2-3 Views    Follow-up: Return if symptoms worsen or fail to improve.   Procedures: No procedures performed      Clinical History: CLINICAL DATA:  74 year old male with left side neck pain radiating to the shoulder. Stiffness, decreased range of motion since November. Prior surgery.   EXAM: MRI CERVICAL SPINE WITHOUT CONTRAST   TECHNIQUE: Multiplanar, multisequence MR imaging of the cervical spine was performed. No intravenous contrast was administered.   COMPARISON:  Cervical spine radiographs 03/11/2022.   FINDINGS: Alignment: Stable since December, mild straightening of lordosis. Subtle anterolisthesis of C7 on T1. No other significant spondylolisthesis.   Vertebrae: Normal background bone marrow signal. Mild hardware susceptibility artifact from C5-C6 and C6-C7  ACDF. Evidence of solid arthrodesis at those levels.   Moderate patchy marrow edema affecting the chronically degenerated left C3-C4 facets (series 5, images 14 and 15). Normal background bone marrow signal. Superimposed right side facet ankylosis at C2-C3.   No other marrow edema or acute osseous abnormality. In the cervical spine.   However, there is evidence of mild degenerative posterior element marrow edema  in the right upper thoracic spine T1 and T2 (facets/pars series 5, image 4).   Cord: Degenerative spinal cord mass effect is mild at C3-C4 and C4-C5. No spinal cord edema or myelomalacia. Negative visible upper thoracic cord.   Posterior Fossa, vertebral arteries, paraspinal tissues: Cervicomedullary junction is within normal limits. Negative visible posterior fossa.   Maintained major vascular flow voids in the neck with dominant appearing left vertebral artery. Negative visible neck soft tissues, lung apices.   Disc levels:   C2-C3:  Ankylosed right facet. No stenosis.   C3-C4: Broad-based posterior disc bulge or protrusion with foraminal and posterior endplate spurring. Moderate ligament flavum (series 7, image 9) and mild to moderate facet hypertrophy which is greater on the left (marrow edema there described above). Mild spinal stenosis and spinal cord mass effect. Moderate to severe left and mild-to-moderate right C4 neural foraminal stenosis.   C4-C5: Disc space loss with circumferential disc bulge. Broad-based posterior component. Similar endplate spurring. Moderate facet and ligament flavum hypertrophy (series 7, image 13). Mild spinal stenosis and spinal cord mass effect. Moderate to severe bilateral C5 foraminal stenosis.   C5-C6:  Prior ACDF with solid arthrodesis.  No stenosis.   C6-C7:  Prior ACDF with solid arthrodesis.  No stenosis.   C7-T1: Subtle anterolisthesis. Mild facet and ligament flavum hypertrophy. No stenosis.   T1-T2: Subtle anterolisthesis and disc bulging. Moderate facet hypertrophy with evidence of a left side foraminal 6 mm degenerative synovial cyst (series 5, image 12). Associated mild to moderate left T1 and mild right T1 neural foraminal stenosis.   T2-T3: Negative, no stenosis.   IMPRESSION: 1. Prior ACDF at C5-C6 and C6-C7 with solid arthrodesis and no adverse features. And superimposed right side facet ankylosis at C2-C3.   2.  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 Subsequent mild multifactorial spinal stenosis AND spinal cord mass effect at both levels. No cord signal abnormality. Moderate to severe bilateral C4 and C5 neural foraminal stenosis.   3. Lesser adjacent segment disease at C7-T1 with facet hypertrophy. And moderate facet arthropathy at the T1-T2 level including a left foraminal synovial cyst contributing to moderate to severe left T1 neural foraminal stenosis.     Electronically Signed   By: VEAR Hurst M.D.   On: 07/22/2022 14:17   He reports that he quit smoking about 41 years ago. His smoking use included cigarettes. He started smoking about 58 years ago. He has a 51 pack-year smoking history. He has never been exposed to tobacco smoke. He has never used smokeless tobacco. No results for input(s): HGBA1C, LABURIC in the last 8760 hours.  Objective:  VS:  HT:    WT:   BMI:     BP:   HR: bpm  TEMP: ( )  RESP:  Physical Exam Vitals and nursing note reviewed.  HENT:     Head: Normocephalic and atraumatic.     Right Ear: External ear normal.     Left Ear: External ear  normal.     Nose: Nose normal.     Mouth/Throat:     Mouth: Mucous membranes are moist.  Eyes:     Extraocular Movements: Extraocular movements intact.  Cardiovascular:     Rate and Rhythm: Normal rate.     Pulses: Normal pulses.  Pulmonary:     Effort: Pulmonary effort is normal.  Abdominal:     General: Abdomen is flat. There is no distension.  Musculoskeletal:        General: Tenderness present.     Cervical back: Normal range of motion.     Comments: Patient rises from seated position to standing without difficulty. Good lumbar range of motion. No pain noted with facet loading. 5/5 strength noted with bilateral hip flexion, knee flexion/extension,  ankle dorsiflexion/plantarflexion and EHL. No clonus noted bilaterally. No pain upon palpation of greater trochanters. No pain with internal/external rotation of bilateral hips. Sensation intact bilaterally. Negative slump test bilaterally. Ambulates without aid, gait steady.     Skin:    General: Skin is warm and dry.     Capillary Refill: Capillary refill takes less than 2 seconds.  Neurological:     General: No focal deficit present.     Mental Status: He is alert and oriented to person, place, and time.  Psychiatric:        Mood and Affect: Mood normal.        Behavior: Behavior normal.     Ortho Exam  Imaging: XR Lumbar Spine 2-3 Views Result Date: 05/03/2024 AP and lateral radiographs of lumbar spine show normal alignment and segmentation. Well preserved disc spacing. Mild degenerative changes to lower lumbar spine. No spondylolisthesis. No fractures.    Past Medical/Family/Surgical/Social History: Medications & Allergies reviewed per EMR, new medications updated. Patient Active Problem List   Diagnosis Date Noted   Radiculitis of left cervical region 03/26/2024   Encounter for general adult medical examination with abnormal findings 02/03/2023   Moderate episode of recurrent major depressive disorder (HCC) 01/19/2022   Hearing loss due to cerumen impaction, bilateral 07/13/2021   Panlobular emphysema (HCC) 01/07/2021   Neuropathy involving both lower extremities 04/18/2020   Seasonal allergic rhinitis due to pollen 10/24/2018   Essential hypertension 04/25/2018   Laryngopharyngeal reflux (LPR) 02/07/2018   Kidney stone on left side 03/30/2016   IBS (irritable bowel syndrome) 06/27/2015   Coronary artery disease involving native coronary artery of native heart without angina pectoris 06/26/2015   OAB (overactive bladder) 04/16/2015   Atrial flutter (HCC)    Hyperlipidemia with target LDL less than 70 01/18/2014   BPH (benign prostatic hyperplasia) 04/23/2011    Hypothyroidism 04/15/2011   Atrial fibrillation (HCC)    Past Medical History:  Diagnosis Date   Age-related macular degeneration, dry, left eye    Alcohol dependence (HCC)    Arthritis    maybe a little bit in my left knee and right forefinger (05/30/2015)   Atrial fibrillation (HCC)    ablation 05-2015 with no reoccurance as of 09-2015   Basal cell carcinoma    Depression    hx   Family history of adverse reaction to anesthesia    father had head injuries and dementia; everytime he was put under less of his mentation came back   Gastroparesis    GERD (gastroesophageal reflux disease)    History of kidney stones    Hypercholesterolemia dx'd 02/2015   Hypertension    Hypothyroidism    Kidney stones    Migraine aura without headache  very rare now (05/30/2015)   Pneumonia ~ 2004   Squamous carcinoma    hand, chest   Family History  Problem Relation Age of Onset   Stroke Brother    Cancer Brother        basosquamous cell carcinoma-head   Heart failure Mother    Colon cancer Cousin 50   Heart disease Neg Hx    Hyperlipidemia Neg Hx    Hypertension Neg Hx    Diabetes Neg Hx    Past Surgical History:  Procedure Laterality Date   ANTERIOR CERVICAL DECOMP/DISCECTOMY FUSION  04/2009   ATRIAL FIBRILLATION ABLATION  05/30/2015   BACK SURGERY     BASAL CELL CARCINOMA EXCISION     chest or back or left arm   CARDIOVERSION  06/2002   CATARACT EXTRACTION W/ INTRAOCULAR LENS IMPLANT Left ~ 2012   COLONOSCOPY     COLONOSCOPY W/ POLYPECTOMY  02/2003   CYSTOSCOPY W/ STONE MANIPULATION  1980's   basketted it out; no stent   ELECTROPHYSIOLOGIC STUDY N/A 05/30/2015   Procedure: Atrial Fibrillation Ablation;  Surgeon: Will Gladis Norton, MD;  Location: [facility];  Service: Cardiovascular;  Laterality: N/A;   EXTRACORPOREAL SHOCK WAVE LITHOTRIPSY Left 04/12/2016   Procedure: LEFT EXTRACORPOREAL SHOCK WAVE LITHOTRIPSY (ESWL);  Surgeon: Norleen Seltzer, MD;  Location: [facility];   Service: Urology;  Laterality: Left;   INGUINAL HERNIA REPAIR Bilateral 2004   INGUINAL HERNIA REPAIR Bilateral 2004   MOHS SURGERY Right    temple   SQUAMOUS CELL CARCINOMA EXCISION     chest or back or left arm   Social History   Occupational History   Occupation: band runner, broadcasting/film/video    Comment: retired  Tobacco Use   Smoking status: Former    Current packs/day: 0.00    Average packs/day: 3.0 packs/day for 17.0 years (51.0 ttl pk-yrs)    Types: Cigarettes    Start date: 03/29/1966    Quit date: 03/30/1983    Years since quitting: 41.1    Passive exposure: Never   Smokeless tobacco: Never  Vaping Use   Vaping status: Never Used  Substance and Sexual Activity   Alcohol use: No    Alcohol/week: 0.0 standard drinks of alcohol    Comment: 05/30/2015 recovering alcoholic; dry date 04/19/2011   Drug use: No    Types: Marijuana, LSD, Mescaline    Comment: 03/07/2015 stopped all drugs back in the 1980's   Sexual activity: Not Currently   "

## 2024-05-08 ENCOUNTER — Ambulatory Visit

## 2024-05-10 ENCOUNTER — Ambulatory Visit: Admitting: Rehabilitative and Restorative Service Providers"

## 2024-05-21 ENCOUNTER — Encounter: Admitting: Physical Medicine and Rehabilitation

## 2024-09-24 ENCOUNTER — Ambulatory Visit

## 2024-09-24 ENCOUNTER — Encounter: Admitting: Internal Medicine
